# Patient Record
Sex: Male | Born: 1940
Health system: Southern US, Community
[De-identification: ages and names within clinical notes are randomized; demographics above are authoritative.]

## PROBLEM LIST (undated history)

## (undated) DIAGNOSIS — E7800 Pure hypercholesterolemia, unspecified: Secondary | ICD-10-CM

## (undated) DIAGNOSIS — I10 Essential (primary) hypertension: Secondary | ICD-10-CM

## (undated) DIAGNOSIS — M1990 Unspecified osteoarthritis, unspecified site: Secondary | ICD-10-CM

## (undated) DIAGNOSIS — Z8719 Personal history of other diseases of the digestive system: Secondary | ICD-10-CM

## (undated) DIAGNOSIS — I739 Peripheral vascular disease, unspecified: Secondary | ICD-10-CM

## (undated) DIAGNOSIS — E119 Type 2 diabetes mellitus without complications: Secondary | ICD-10-CM

## (undated) DIAGNOSIS — K219 Gastro-esophageal reflux disease without esophagitis: Secondary | ICD-10-CM

## (undated) DIAGNOSIS — I639 Cerebral infarction, unspecified: Secondary | ICD-10-CM

## (undated) DIAGNOSIS — C44300 Unspecified malignant neoplasm of skin of unspecified part of face: Secondary | ICD-10-CM

## (undated) DIAGNOSIS — Z8739 Personal history of other diseases of the musculoskeletal system and connective tissue: Secondary | ICD-10-CM

## (undated) DIAGNOSIS — H9190 Unspecified hearing loss, unspecified ear: Secondary | ICD-10-CM

## (undated) DIAGNOSIS — F329 Major depressive disorder, single episode, unspecified: Secondary | ICD-10-CM

## (undated) DIAGNOSIS — F32A Depression, unspecified: Secondary | ICD-10-CM

## (undated) HISTORY — PX: COLONOSCOPY: SHX174

## (undated) HISTORY — PX: SKIN CANCER EXCISION: SHX779

## (undated) HISTORY — PX: KNEE ARTHROSCOPY: SUR90

## (undated) HISTORY — DX: Type 2 diabetes mellitus without complications: E11.9

## (undated) HISTORY — DX: Unspecified hearing loss, unspecified ear: H91.90

---

## 2000-04-30 ENCOUNTER — Ambulatory Visit (HOSPITAL_COMMUNITY): Admission: RE | Admit: 2000-04-30 | Discharge: 2000-04-30 | Payer: Self-pay | Admitting: Gastroenterology

## 2001-05-30 ENCOUNTER — Encounter: Payer: Self-pay | Admitting: Gastroenterology

## 2001-05-30 ENCOUNTER — Encounter: Admission: RE | Admit: 2001-05-30 | Discharge: 2001-05-30 | Payer: Self-pay | Admitting: Gastroenterology

## 2002-12-29 ENCOUNTER — Inpatient Hospital Stay (HOSPITAL_COMMUNITY): Admission: EM | Admit: 2002-12-29 | Discharge: 2002-12-30 | Payer: Self-pay | Admitting: Emergency Medicine

## 2002-12-29 ENCOUNTER — Encounter: Payer: Self-pay | Admitting: Internal Medicine

## 2002-12-29 ENCOUNTER — Encounter (INDEPENDENT_AMBULATORY_CARE_PROVIDER_SITE_OTHER): Payer: Self-pay | Admitting: *Deleted

## 2002-12-30 ENCOUNTER — Encounter: Payer: Self-pay | Admitting: Internal Medicine

## 2004-12-13 ENCOUNTER — Encounter: Admission: RE | Admit: 2004-12-13 | Discharge: 2004-12-13 | Payer: Self-pay | Admitting: Family Medicine

## 2004-12-29 ENCOUNTER — Encounter: Admission: RE | Admit: 2004-12-29 | Discharge: 2004-12-29 | Payer: Self-pay | Admitting: Family Medicine

## 2004-12-31 ENCOUNTER — Emergency Department (HOSPITAL_COMMUNITY): Admission: EM | Admit: 2004-12-31 | Discharge: 2004-12-31 | Payer: Self-pay | Admitting: Family Medicine

## 2007-09-16 ENCOUNTER — Emergency Department (HOSPITAL_COMMUNITY): Admission: EM | Admit: 2007-09-16 | Discharge: 2007-09-16 | Payer: Self-pay | Admitting: Emergency Medicine

## 2011-02-10 NOTE — Procedures (Signed)
[facility]  Patient:    Trevor Ward, Trevor Ward                       MRN: [id number] Proc. Date: 04/30/00 Attending:  Verlin Grills, M.D. CC:         Desma Maxim, M.D.                           Procedure Report  PROCEDURE:  Colonoscopy and colon polyp biopsy.  ENDOSCOPIST:  Verlin Grills, M.D.  REFERRING PHYSICIAN:  Desma Maxim, M.D.  INDICATIONS FOR PROCEDURE:  The patient is a 70 year old male referred by Dr. Donia Guiles for diagnostic colonoscopy to evaluate Hemoccult positive stool.  The patient takes Advil on and off for tendinitis.  When he takes Advil, he will pass fresh blood with it, otherwise normal bowel movements.  He denies a personal or family history of colorectal neoplasia.  The patient viewed our colonoscopy education film.  I discussed with him the complications associated with colonoscopy and polypectomy including a 15 per 1000 risk for bleeding, and 4 per 1000 risk of intestinal perforation.  The patient has signed his operative permit for a diagnostic colonoscopy.  MEDICATION ALLERGIES:  None.  CHRONIC MEDICATIONS:  Advil for elbow tendinitis, Zantac p.r.n. heartburn.  PAST MEDICAL HISTORY:  Knee surgery in 1999.  PREMEDICATION:  Demerol 50 mg and Versed 5 mg.  ENDOSCOPE:  Olympus pediatric colonoscope.  DESCRIPTION OF PROCEDURE:  After obtaining informed consent, the patient was placed in the left lateral decubitus position.  I administered intravenous Demerol and intravenous Versed to achieve conscious sedation for the procedure.  The patients blood pressure, oxygen saturation, and cardiac rhythm were monitored throughout the procedure and documented in the medical record.  Anal inspection was normal.  Digital rectal exam revealed a non-nodular prostate.  The Olympus pediatric video colonoscope was introduced into the rectum and under direct vision advanced to the cecum as identified by a  normal-appearing ileocecal valve.  Colonoscopic preparation for the exam today was satisfactory.  Rectum:  Large nonbleeding internal hemorrhoids.  No evidence of rectal neoplasia.  Sigmoid colon and descending colon:  Left colonic diverticulosis.  Splenic flexure normal.  Transverse colon:  Colonic diverticulosis.  Hepatic flexure normal.  Ascending colon:  Colonic diverticulosis.  Cecum and ileocecal valve:  From the proximal cecum, a 1 mm sessile polyp was removed with the cold biopsy forceps and submitted for pathologic interpretation.  ASSESSMENT: 1. Large nonbleeding internal hemorrhoids. 2. Universal colonic diverticulosis. 3. From the proximal cecum, a 1 mm sessile polyp was removed with the cold    biopsy forceps.  RECOMMENDATIONS:  The cecal polyp returned neoplastic.  The patient should undergo a repeat colonoscopy in five years.  If the cecal polyp is non-neoplastic, he should undergo a repeat colonoscopy in 10 years. DD:  04/30/00 TD:  04/30/00 Job: [id number]

## 2011-05-15 ENCOUNTER — Emergency Department (HOSPITAL_COMMUNITY): Payer: Medicare Other

## 2011-05-15 ENCOUNTER — Inpatient Hospital Stay (INDEPENDENT_AMBULATORY_CARE_PROVIDER_SITE_OTHER)
Admission: RE | Admit: 2011-05-15 | Discharge: 2011-05-15 | Disposition: A | Discharge status: Home / Self Care | Payer: Medicare Other | Source: Ambulatory Visit | Attending: Family Medicine | Admitting: Family Medicine

## 2011-05-15 ENCOUNTER — Emergency Department (HOSPITAL_COMMUNITY)
Admission: EM | Admit: 2011-05-15 | Discharge: 2011-05-15 | Disposition: A | Discharge status: Home / Self Care | Payer: Medicare Other | Attending: Emergency Medicine | Admitting: Emergency Medicine

## 2011-05-15 DIAGNOSIS — R51 Headache: Secondary | ICD-10-CM | POA: Insufficient documentation

## 2011-05-15 DIAGNOSIS — IMO0002 Reserved for concepts with insufficient information to code with codable children: Secondary | ICD-10-CM | POA: Insufficient documentation

## 2011-05-15 LAB — CBC
HCT: 44.8 % (ref 39.0–52.0)
MCH: 30.7 pg (ref 26.0–34.0)
MCV: 84.4 fL (ref 78.0–100.0)
Platelets: 217 10*3/uL (ref 150–400)
RBC: 5.31 MIL/uL (ref 4.22–5.81)

## 2011-05-15 LAB — DIFFERENTIAL
Eosinophils Absolute: 0.2 10*3/uL (ref 0.0–0.7)
Lymphs Abs: 2.5 10*3/uL (ref 0.7–4.0)
Monocytes Relative: 8 % (ref 3–12)
Neutrophils Relative %: 54 % (ref 43–77)

## 2011-05-15 LAB — COMPREHENSIVE METABOLIC PANEL
AST: 18 U/L (ref 0–37)
BUN: 14 mg/dL (ref 6–23)
CO2: 30 mEq/L (ref 19–32)
Calcium: 9.9 mg/dL (ref 8.4–10.5)
Creatinine, Ser: 0.87 mg/dL (ref 0.50–1.35)
GFR calc Af Amer: >60 mL/min (ref >60)
GFR calc non Af Amer: >60 mL/min (ref >60)
Glucose, Bld: 89 mg/dL (ref 70–99)

## 2011-10-05 DIAGNOSIS — M109 Gout, unspecified: Secondary | ICD-10-CM | POA: Diagnosis not present

## 2012-01-15 DIAGNOSIS — L570 Actinic keratosis: Secondary | ICD-10-CM | POA: Diagnosis not present

## 2012-08-06 DIAGNOSIS — D485 Neoplasm of uncertain behavior of skin: Secondary | ICD-10-CM | POA: Diagnosis not present

## 2012-08-06 DIAGNOSIS — L570 Actinic keratosis: Secondary | ICD-10-CM | POA: Diagnosis not present

## 2012-09-30 DIAGNOSIS — L570 Actinic keratosis: Secondary | ICD-10-CM | POA: Diagnosis not present

## 2012-10-14 DIAGNOSIS — M7040 Prepatellar bursitis, unspecified knee: Secondary | ICD-10-CM | POA: Diagnosis not present

## 2012-11-11 DIAGNOSIS — M7040 Prepatellar bursitis, unspecified knee: Secondary | ICD-10-CM | POA: Diagnosis not present

## 2013-01-01 DIAGNOSIS — R0789 Other chest pain: Secondary | ICD-10-CM | POA: Diagnosis not present

## 2013-01-01 DIAGNOSIS — R1013 Epigastric pain: Secondary | ICD-10-CM | POA: Diagnosis not present

## 2013-01-01 DIAGNOSIS — K219 Gastro-esophageal reflux disease without esophagitis: Secondary | ICD-10-CM | POA: Diagnosis not present

## 2013-03-31 DIAGNOSIS — L570 Actinic keratosis: Secondary | ICD-10-CM | POA: Diagnosis not present

## 2013-10-14 DIAGNOSIS — L570 Actinic keratosis: Secondary | ICD-10-CM | POA: Diagnosis not present

## 2013-10-14 DIAGNOSIS — L578 Other skin changes due to chronic exposure to nonionizing radiation: Secondary | ICD-10-CM | POA: Diagnosis not present

## 2013-12-09 DIAGNOSIS — L570 Actinic keratosis: Secondary | ICD-10-CM | POA: Diagnosis not present

## 2013-12-23 DIAGNOSIS — G479 Sleep disorder, unspecified: Secondary | ICD-10-CM | POA: Diagnosis not present

## 2013-12-23 DIAGNOSIS — M109 Gout, unspecified: Secondary | ICD-10-CM | POA: Diagnosis not present

## 2013-12-23 DIAGNOSIS — Z23 Encounter for immunization: Secondary | ICD-10-CM | POA: Diagnosis not present

## 2013-12-23 DIAGNOSIS — K219 Gastro-esophageal reflux disease without esophagitis: Secondary | ICD-10-CM | POA: Diagnosis not present

## 2013-12-23 DIAGNOSIS — E782 Mixed hyperlipidemia: Secondary | ICD-10-CM | POA: Diagnosis not present

## 2013-12-31 DIAGNOSIS — E782 Mixed hyperlipidemia: Secondary | ICD-10-CM | POA: Diagnosis not present

## 2013-12-31 DIAGNOSIS — M109 Gout, unspecified: Secondary | ICD-10-CM | POA: Diagnosis not present

## 2014-03-23 DIAGNOSIS — M109 Gout, unspecified: Secondary | ICD-10-CM | POA: Diagnosis not present

## 2014-06-17 ENCOUNTER — Emergency Department (HOSPITAL_COMMUNITY)
Admission: EM | Admit: 2014-06-17 | Discharge: 2014-06-18 | Disposition: A | Discharge status: Home / Self Care | Payer: Medicare Other | Attending: Emergency Medicine | Admitting: Emergency Medicine

## 2014-06-17 ENCOUNTER — Encounter (HOSPITAL_COMMUNITY): Payer: Self-pay | Admitting: Emergency Medicine

## 2014-06-17 DIAGNOSIS — Z87891 Personal history of nicotine dependence: Secondary | ICD-10-CM | POA: Insufficient documentation

## 2014-06-17 DIAGNOSIS — Z79899 Other long term (current) drug therapy: Secondary | ICD-10-CM | POA: Diagnosis not present

## 2014-06-17 DIAGNOSIS — R011 Cardiac murmur, unspecified: Secondary | ICD-10-CM | POA: Diagnosis not present

## 2014-06-17 DIAGNOSIS — I10 Essential (primary) hypertension: Secondary | ICD-10-CM | POA: Diagnosis not present

## 2014-06-17 DIAGNOSIS — Z791 Long term (current) use of non-steroidal anti-inflammatories (NSAID): Secondary | ICD-10-CM | POA: Diagnosis not present

## 2014-06-17 LAB — COMPREHENSIVE METABOLIC PANEL
ALBUMIN: 3.7 g/dL (ref 3.5–5.2)
ALT: 11 U/L (ref 0–53)
AST: 14 U/L (ref 0–37)
Alkaline Phosphatase: 83 U/L (ref 39–117)
Anion gap: 11 (ref 5–15)
BUN: 12 mg/dL (ref 6–23)
CALCIUM: 9.4 mg/dL (ref 8.4–10.5)
CO2: 27 meq/L (ref 19–32)
CREATININE: 0.92 mg/dL (ref 0.50–1.35)
Chloride: 103 mEq/L (ref 96–112)
GFR calc Af Amer: >90 mL/min (ref >90)
GFR, EST NON AFRICAN AMERICAN: 80 mL/min — AB (ref >90)
Glucose, Bld: 99 mg/dL (ref 70–99)
Potassium: 4.7 mEq/L (ref 3.7–5.3)
Sodium: 141 mEq/L (ref 137–147)
TOTAL PROTEIN: 7.1 g/dL (ref 6.0–8.3)
Total Bilirubin: 0.3 mg/dL (ref 0.3–1.2)

## 2014-06-17 LAB — CBC WITH DIFFERENTIAL/PLATELET
BASOS ABS: 0 10*3/uL (ref 0.0–0.1)
BASOS PCT: 0 % (ref 0–1)
EOS ABS: 0.3 10*3/uL (ref 0.0–0.7)
EOS PCT: 5 % (ref 0–5)
HEMATOCRIT: 42.4 % (ref 39.0–52.0)
Hemoglobin: 14.7 g/dL (ref 13.0–17.0)
Lymphocytes Relative: 33 % (ref 12–46)
Lymphs Abs: 2.2 10*3/uL (ref 0.7–4.0)
MCH: 29.8 pg (ref 26.0–34.0)
MCHC: 34.7 g/dL (ref 30.0–36.0)
MCV: 86 fL (ref 78.0–100.0)
MONO ABS: 0.5 10*3/uL (ref 0.1–1.0)
Monocytes Relative: 8 % (ref 3–12)
Neutro Abs: 3.6 10*3/uL (ref 1.7–7.7)
Neutrophils Relative %: 54 % (ref 43–77)
Platelets: 227 10*3/uL (ref 150–400)
RBC: 4.93 MIL/uL (ref 4.22–5.81)
RDW: 13.8 % (ref 11.5–15.5)
WBC: 6.7 10*3/uL (ref 4.0–10.5)

## 2014-06-17 NOTE — ED Notes (Signed)
Pt monitored by pulse ox, bp cuff, and 12-lead. 

## 2014-06-17 NOTE — ED Notes (Signed)
Pt. reports elevated blood pressure at home this evening 204/84 , alert and oriented , respirations unlabored ,denies pain / no nausea or vomitting . Pt. is not taking antihypertensive medication .

## 2014-06-17 NOTE — ED Notes (Signed)
Family at bedside. 

## 2014-06-18 DIAGNOSIS — I10 Essential (primary) hypertension: Secondary | ICD-10-CM | POA: Diagnosis not present

## 2014-06-18 LAB — I-STAT TROPONIN, ED: TROPONIN I, POC: 0 ng/mL (ref 0.00–0.08)

## 2014-06-18 NOTE — Discharge Instructions (Signed)
If you were given medicines take as directed.  If you are on coumadin or contraceptives realize their levels and effectiveness is altered by many different medicines.  If you have any reaction (rash, tongues swelling, other) to the medicines stop taking and see a physician.   Please follow up as directed and return to the ER or see a physician for new or worsening symptoms.  Thank you. Filed Vitals:   06/17/14 2330 06/17/14 2345 06/18/14 0000 06/18/14 0015  BP: 158/88 158/81 177/78 153/78  Pulse: 76 73 82 72  Temp:      TempSrc:      Resp: 19 22 22 17   Height:      Weight:      SpO2: 96% 99% 99% 99%    Hypertension Hypertension, commonly called high blood pressure, is when the force of blood pumping through your arteries is too strong. Your arteries are the blood vessels that carry blood from your heart throughout your body. A blood pressure reading consists of a higher number over a lower number, such as 110/72. The higher number (systolic) is the pressure inside your arteries when your heart pumps. The lower number (diastolic) is the pressure inside your arteries when your heart relaxes. Ideally you want your blood pressure below 120/80. Hypertension forces your heart to work harder to pump blood. Your arteries may become narrow or stiff. Having hypertension puts you at risk for heart disease, stroke, and other problems.  RISK FACTORS Some risk factors for high blood pressure are controllable. Others are not.  Risk factors you cannot control include:   Race. You may be at higher risk if you are African American.  Age. Risk increases with age.  Gender. Men are at higher risk than women before age 47 years. After age 61, women are at higher risk than men. Risk factors you can control include:  Not getting enough exercise or physical activity.  Being overweight.  Getting too much fat, sugar, calories, or salt in your diet.  Drinking too much alcohol. SIGNS AND SYMPTOMS Hypertension  does not usually cause signs or symptoms. Extremely high blood pressure (hypertensive crisis) may cause headache, anxiety, shortness of breath, and nosebleed. DIAGNOSIS  To check if you have hypertension, your health care provider will measure your blood pressure while you are seated, with your arm held at the level of your heart. It should be measured at least twice using the same arm. Certain conditions can cause a difference in blood pressure between your right and left arms. A blood pressure reading that is higher than normal on one occasion does not mean that you need treatment. If one blood pressure reading is high, ask your health care provider about having it checked again. TREATMENT  Treating high blood pressure includes making lifestyle changes and possibly taking medicine. Living a healthy lifestyle can help lower high blood pressure. You may need to change some of your habits. Lifestyle changes may include:  Following the DASH diet. This diet is high in fruits, vegetables, and whole grains. It is low in salt, red meat, and added sugars.  Getting at least 2 hours of brisk physical activity every week.  Losing weight if necessary.  Not smoking.  Limiting alcoholic beverages.  Learning ways to reduce stress. If lifestyle changes are not enough to get your blood pressure under control, your health care provider may prescribe medicine. You may need to take more than one. Work closely with your health care provider to understand the risks  and benefits. HOME CARE INSTRUCTIONS  Have your blood pressure rechecked as directed by your health care provider.   Take medicines only as directed by your health care provider. Follow the directions carefully. Blood pressure medicines must be taken as prescribed. The medicine does not work as well when you skip doses. Skipping doses also puts you at risk for problems.   Do not smoke.   Monitor your blood pressure at home as directed by your  health care provider. SEEK MEDICAL CARE IF:   You think you are having a reaction to medicines taken.  You have recurrent headaches or feel dizzy.  You have swelling in your ankles.  You have trouble with your vision. SEEK IMMEDIATE MEDICAL CARE IF:  You develop a severe headache or confusion.  You have unusual weakness, numbness, or feel faint.  You have severe chest or abdominal pain.  You vomit repeatedly.  You have trouble breathing. MAKE SURE YOU:   Understand these instructions.  Will watch your condition.  Will get help right away if you are not doing well or get worse. Document Released: 09/11/2005 Document Revised: 01/26/2014 Document Reviewed: 07/04/2013 [facility] Patient Information 2015 [place], [place]. This information is not intended to replace advice given to you by your health care provider. Make sure you discuss any questions you have with your health care provider.

## 2014-06-18 NOTE — ED Provider Notes (Signed)
CSN: [id number]     Arrival date & time 06/17/14  2124 History   First MD Initiated Contact with Patient 06/17/14 2327     Chief Complaint  Patient presents with  . Hypertension     (Consider location/radiation/quality/duration/timing/severity/associated sxs/prior Treatment) HPI Comments: 73 year old male with significant medical history presents with elevated blood pressure 824 systolic this evening. Patient had mild queasy abdomen around dinnertime however has had no symptoms since. Patient denies chest pain, shortness of breath, neurologic symptoms. Patient says she's ready to home go to bed. Patient has no history of high blood pressure or cardiac history. Patient is not on blood pressure medications. Patient has a primary Dr. follow up with  Patient is a 73 y.o. male presenting with hypertension. The history is provided by the patient.  Hypertension Pertinent negatives include no chest pain, no abdominal pain, no headaches and no shortness of breath.    History reviewed. No pertinent past medical history. History reviewed. No pertinent past surgical history. No family history on file. History  Substance Use Topics  . Smoking status: Former Research scientist (life sciences)  . Smokeless tobacco: Not on file  . Alcohol Use: No    Review of Systems  Constitutional: Negative for fever and chills.  HENT: Negative for congestion.   Eyes: Negative for visual disturbance.  Respiratory: Negative for shortness of breath.   Cardiovascular: Negative for chest pain.  Gastrointestinal: Negative for vomiting and abdominal pain.  Genitourinary: Negative for dysuria and flank pain.  Musculoskeletal: Negative for back pain, neck pain and neck stiffness.  Skin: Negative for rash.  Neurological: Negative for weakness, light-headedness, numbness and headaches.      Allergies  Review of patient's allergies indicates no known allergies.  Home Medications   Prior to Admission medications   Medication Sig Start  Date End Date Taking? Authorizing Provider  amitriptyline (ELAVIL) 10 MG tablet Take 10 mg by mouth at bedtime.   Yes Historical Provider, MD  esomeprazole (NEXIUM) 40 MG capsule Take 40 mg by mouth daily at 12 noon.   Yes Historical Provider, MD  ibuprofen (ADVIL,MOTRIN) 200 MG tablet Take 200-600 mg by mouth every morning.   Yes Historical Provider, MD   BP 158/81  Pulse 73  Temp(Src) 97.7 F (36.5 C) (Oral)  Resp 22  Ht 5\' 11"  (1.803 m)  Wt 182 lb (82.555 kg)  BMI 25.40 kg/m2  SpO2 99% Physical Exam  Nursing note and vitals reviewed. Constitutional: He is oriented to person, place, and time. He appears well-developed and well-nourished.  HENT:  Head: Normocephalic and atraumatic.  Eyes: Conjunctivae are normal. Right eye exhibits no discharge. Left eye exhibits no discharge.  Neck: Normal range of motion. Neck supple. No tracheal deviation present.  Cardiovascular: Normal rate, regular rhythm and intact distal pulses.   Murmur (2+ systolic left sternal border) heard. Pulmonary/Chest: Effort normal and breath sounds normal.  Abdominal: Soft. He exhibits no distension. There is no tenderness. There is no guarding.  Musculoskeletal: He exhibits no edema.  Neurological: He is alert and oriented to person, place, and time. No cranial nerve deficit.  Skin: Skin is warm. No rash noted.  Psychiatric: He has a normal mood and affect.    ED Course  Procedures (including critical care time) Labs Review Labs Reviewed  COMPREHENSIVE METABOLIC PANEL - Abnormal; Notable for the following:    GFR calc non Af Amer 80 (*)    All other components within normal limits  CBC WITH DIFFERENTIAL  Randolm Idol, ED  Imaging Review No results found.   EKG Interpretation   Date/Time:  Thursday June 18 2014 00:22:44 EDT Ventricular Rate:  75 PR Interval:  167 QRS Duration: 82 QT Interval:  403 QTC Calculation: 450 R Axis:   -10 Text Interpretation:  Sinus rhythm Abnormal R-wave  progression, early  transition Confirmed by Elah Avellino  MD, Erbie Arment ([id number]) on 06/18/2014 12:27:16 AM      MDM   Final diagnoses:  None  High blood pressure  Well-appearing male with elevated blood pressure asymptomatic. With age patient had screening labs done in triage, reviewed unremarkable. EKG reviewed no acute findings. Patient remained asymptomatic during observation the ER and comfortable with outpatient followup. Reasons to return discussed with patient and family members.  Results and differential diagnosis were discussed with the patient/parent/guardian. Close follow up outpatient was discussed, comfortable with the plan.   Medications - No data to display  Filed Vitals:   06/17/14 2330 06/17/14 2345 06/18/14 0000 06/18/14 0015  BP: 158/88 158/81 177/78 153/78  Pulse: 76 73 82 72  Temp:      TempSrc:      Resp: 19 22 22 17   Height:      Weight:      SpO2: 96% 99% 99% 99%    Final diagnoses:  None        Mariea Clonts, MD 06/18/14 [phone]

## 2014-06-24 DIAGNOSIS — R030 Elevated blood-pressure reading, without diagnosis of hypertension: Secondary | ICD-10-CM | POA: Diagnosis not present

## 2014-06-24 DIAGNOSIS — M1990 Unspecified osteoarthritis, unspecified site: Secondary | ICD-10-CM | POA: Diagnosis not present

## 2014-08-18 DIAGNOSIS — I10 Essential (primary) hypertension: Secondary | ICD-10-CM | POA: Diagnosis not present

## 2014-08-18 DIAGNOSIS — Z79899 Other long term (current) drug therapy: Secondary | ICD-10-CM | POA: Diagnosis not present

## 2014-09-25 DIAGNOSIS — I639 Cerebral infarction, unspecified: Secondary | ICD-10-CM

## 2014-09-25 HISTORY — DX: Cerebral infarction, unspecified: I63.9

## 2015-02-04 DIAGNOSIS — I10 Essential (primary) hypertension: Secondary | ICD-10-CM | POA: Diagnosis not present

## 2015-02-04 DIAGNOSIS — K219 Gastro-esophageal reflux disease without esophagitis: Secondary | ICD-10-CM | POA: Diagnosis not present

## 2015-02-04 DIAGNOSIS — G4700 Insomnia, unspecified: Secondary | ICD-10-CM | POA: Diagnosis not present

## 2015-02-04 DIAGNOSIS — Z0000 Encounter for general adult medical examination without abnormal findings: Secondary | ICD-10-CM | POA: Diagnosis not present

## 2015-02-04 DIAGNOSIS — M109 Gout, unspecified: Secondary | ICD-10-CM | POA: Diagnosis not present

## 2015-02-04 DIAGNOSIS — E782 Mixed hyperlipidemia: Secondary | ICD-10-CM | POA: Diagnosis not present

## 2015-02-04 DIAGNOSIS — N529 Male erectile dysfunction, unspecified: Secondary | ICD-10-CM | POA: Diagnosis not present

## 2015-02-09 DIAGNOSIS — I10 Essential (primary) hypertension: Secondary | ICD-10-CM | POA: Diagnosis not present

## 2015-04-11 ENCOUNTER — Emergency Department (HOSPITAL_COMMUNITY): Payer: Medicare Other

## 2015-04-11 ENCOUNTER — Inpatient Hospital Stay (HOSPITAL_COMMUNITY): Payer: Medicare Other

## 2015-04-11 ENCOUNTER — Inpatient Hospital Stay (HOSPITAL_COMMUNITY)
Admission: EM | Admit: 2015-04-11 | Discharge: 2015-04-14 | DRG: 066 | Disposition: A | Discharge status: Home / Self Care | Payer: Medicare Other | Attending: Internal Medicine | Admitting: Internal Medicine

## 2015-04-11 ENCOUNTER — Encounter (HOSPITAL_COMMUNITY): Payer: Self-pay | Admitting: Emergency Medicine

## 2015-04-11 DIAGNOSIS — R262 Difficulty in walking, not elsewhere classified: Secondary | ICD-10-CM | POA: Diagnosis present

## 2015-04-11 DIAGNOSIS — Z79899 Other long term (current) drug therapy: Secondary | ICD-10-CM

## 2015-04-11 DIAGNOSIS — Z791 Long term (current) use of non-steroidal anti-inflammatories (NSAID): Secondary | ICD-10-CM

## 2015-04-11 DIAGNOSIS — I63311 Cerebral infarction due to thrombosis of right middle cerebral artery: Secondary | ICD-10-CM | POA: Diagnosis not present

## 2015-04-11 DIAGNOSIS — Z7982 Long term (current) use of aspirin: Secondary | ICD-10-CM | POA: Diagnosis not present

## 2015-04-11 DIAGNOSIS — Z823 Family history of stroke: Secondary | ICD-10-CM | POA: Diagnosis not present

## 2015-04-11 DIAGNOSIS — I6789 Other cerebrovascular disease: Secondary | ICD-10-CM | POA: Diagnosis not present

## 2015-04-11 DIAGNOSIS — M109 Gout, unspecified: Secondary | ICD-10-CM | POA: Diagnosis present

## 2015-04-11 DIAGNOSIS — R29810 Facial weakness: Secondary | ICD-10-CM | POA: Diagnosis present

## 2015-04-11 DIAGNOSIS — I10 Essential (primary) hypertension: Secondary | ICD-10-CM | POA: Diagnosis not present

## 2015-04-11 DIAGNOSIS — I6521 Occlusion and stenosis of right carotid artery: Secondary | ICD-10-CM | POA: Diagnosis not present

## 2015-04-11 DIAGNOSIS — I639 Cerebral infarction, unspecified: Secondary | ICD-10-CM | POA: Diagnosis not present

## 2015-04-11 DIAGNOSIS — Z87891 Personal history of nicotine dependence: Secondary | ICD-10-CM

## 2015-04-11 DIAGNOSIS — I739 Peripheral vascular disease, unspecified: Secondary | ICD-10-CM | POA: Diagnosis present

## 2015-04-11 DIAGNOSIS — Z006 Encounter for examination for normal comparison and control in clinical research program: Secondary | ICD-10-CM | POA: Diagnosis not present

## 2015-04-11 DIAGNOSIS — Z8673 Personal history of transient ischemic attack (TIA), and cerebral infarction without residual deficits: Secondary | ICD-10-CM | POA: Diagnosis present

## 2015-04-11 DIAGNOSIS — E785 Hyperlipidemia, unspecified: Secondary | ICD-10-CM | POA: Diagnosis not present

## 2015-04-11 DIAGNOSIS — I6622 Occlusion and stenosis of left posterior cerebral artery: Secondary | ICD-10-CM | POA: Diagnosis present

## 2015-04-11 DIAGNOSIS — I6503 Occlusion and stenosis of bilateral vertebral arteries: Secondary | ICD-10-CM | POA: Diagnosis not present

## 2015-04-11 DIAGNOSIS — I63231 Cerebral infarction due to unspecified occlusion or stenosis of right carotid arteries: Secondary | ICD-10-CM | POA: Diagnosis not present

## 2015-04-11 HISTORY — DX: Essential (primary) hypertension: I10

## 2015-04-11 LAB — COMPREHENSIVE METABOLIC PANEL
ALT: 12 U/L — ABNORMAL LOW (ref 17–63)
AST: 16 U/L (ref 15–41)
Albumin: 3.3 g/dL — ABNORMAL LOW (ref 3.5–5.0)
Alkaline Phosphatase: 60 U/L (ref 38–126)
Anion gap: 7 (ref 5–15)
BUN: 13 mg/dL (ref 6–20)
CO2: 25 mmol/L (ref 22–32)
CREATININE: 1.15 mg/dL (ref 0.61–1.24)
Calcium: 8.6 mg/dL — ABNORMAL LOW (ref 8.9–10.3)
Chloride: 105 mmol/L (ref 101–111)
GFR, EST NON AFRICAN AMERICAN: 60 mL/min — AB (ref >60)
GLUCOSE: 91 mg/dL (ref 65–99)
Potassium: 4.5 mmol/L (ref 3.5–5.1)
Sodium: 137 mmol/L (ref 135–145)
TOTAL PROTEIN: 5.9 g/dL — AB (ref 6.5–8.1)
Total Bilirubin: 0.3 mg/dL (ref 0.3–1.2)

## 2015-04-11 LAB — RAPID URINE DRUG SCREEN, HOSP PERFORMED
Amphetamines: NOT DETECTED
Barbiturates: NOT DETECTED
Benzodiazepines: NOT DETECTED
Cocaine: NOT DETECTED
OPIATES: NOT DETECTED
TETRAHYDROCANNABINOL: NOT DETECTED

## 2015-04-11 LAB — TSH: TSH: 1.757 u[IU]/mL (ref 0.350–4.500)

## 2015-04-11 LAB — CBG MONITORING, ED: Glucose-Capillary: 80 mg/dL (ref 65–99)

## 2015-04-11 LAB — I-STAT CHEM 8, ED
BUN: 15 mg/dL (ref 6–20)
CALCIUM ION: 1.14 mmol/L (ref 1.13–1.30)
CHLORIDE: 102 mmol/L (ref 101–111)
Creatinine, Ser: 1.1 mg/dL (ref 0.61–1.24)
Glucose, Bld: 86 mg/dL (ref 65–99)
HEMATOCRIT: 41 % (ref 39.0–52.0)
Hemoglobin: 13.9 g/dL (ref 13.0–17.0)
POTASSIUM: 4.5 mmol/L (ref 3.5–5.1)
Sodium: 139 mmol/L (ref 135–145)
TCO2: 23 mmol/L (ref 0–100)

## 2015-04-11 LAB — URINALYSIS, ROUTINE W REFLEX MICROSCOPIC
BILIRUBIN URINE: NEGATIVE
Glucose, UA: NEGATIVE mg/dL
Hgb urine dipstick: NEGATIVE
Ketones, ur: NEGATIVE mg/dL
Leukocytes, UA: NEGATIVE
NITRITE: NEGATIVE
PH: 6.5 (ref 5.0–8.0)
Protein, ur: NEGATIVE mg/dL
SPECIFIC GRAVITY, URINE: 1.007 (ref 1.005–1.030)
Urobilinogen, UA: 0.2 mg/dL (ref 0.0–1.0)

## 2015-04-11 LAB — CBC
HEMATOCRIT: 38 % — AB (ref 39.0–52.0)
HEMOGLOBIN: 13.1 g/dL (ref 13.0–17.0)
MCH: 30.4 pg (ref 26.0–34.0)
MCHC: 34.5 g/dL (ref 30.0–36.0)
MCV: 88.2 fL (ref 78.0–100.0)
Platelets: 206 10*3/uL (ref 150–400)
RBC: 4.31 MIL/uL (ref 4.22–5.81)
RDW: 14 % (ref 11.5–15.5)
WBC: 6.8 10*3/uL (ref 4.0–10.5)

## 2015-04-11 LAB — DIFFERENTIAL
Basophils Absolute: 0 10*3/uL (ref 0.0–0.1)
Basophils Relative: 0 % (ref 0–1)
EOS ABS: 0.2 10*3/uL (ref 0.0–0.7)
EOS PCT: 3 % (ref 0–5)
LYMPHS ABS: 1.4 10*3/uL (ref 0.7–4.0)
Lymphocytes Relative: 20 % (ref 12–46)
MONO ABS: 0.4 10*3/uL (ref 0.1–1.0)
MONOS PCT: 6 % (ref 3–12)
NEUTROS ABS: 4.8 10*3/uL (ref 1.7–7.7)
Neutrophils Relative %: 71 % (ref 43–77)

## 2015-04-11 LAB — APTT: APTT: 33 s (ref 24–37)

## 2015-04-11 LAB — PROTIME-INR
INR: 1.05 (ref 0.00–1.49)
PROTHROMBIN TIME: 13.9 s (ref 11.6–15.2)

## 2015-04-11 LAB — I-STAT TROPONIN, ED: Troponin i, poc: 0.01 ng/mL (ref 0.00–0.08)

## 2015-04-11 LAB — ETHANOL: Alcohol, Ethyl (B): <5 mg/dL (ref <5)

## 2015-04-11 MED ORDER — ALLOPURINOL 100 MG PO TABS
100.0000 mg | ORAL_TABLET | Freq: Two times a day (BID) | ORAL | Status: DC
  Administered 2015-04-11 – 2015-04-14 (×7): 100 mg via ORAL
  Filled 2015-04-11 (×8): qty 1

## 2015-04-11 MED ORDER — STUDY - INVESTIGATIONAL DRUG SIMPLE RECORD
600.0000 mg | Status: AC
  Administered 2015-04-11: 600 mg via ORAL
  Filled 2015-04-11: qty 0.12

## 2015-04-11 MED ORDER — ENOXAPARIN SODIUM 40 MG/0.4ML ~~LOC~~ SOLN
40.0000 mg | SUBCUTANEOUS | Status: DC
  Administered 2015-04-11: 40 mg via SUBCUTANEOUS
  Filled 2015-04-11: qty 0.4

## 2015-04-11 MED ORDER — STROKE: EARLY STAGES OF RECOVERY BOOK
Freq: Once | Status: AC
  Administered 2015-04-11: 1

## 2015-04-11 MED ORDER — PANTOPRAZOLE SODIUM 40 MG PO TBEC
40.0000 mg | DELAYED_RELEASE_TABLET | Freq: Every day | ORAL | Status: DC
  Administered 2015-04-11 – 2015-04-14 (×4): 40 mg via ORAL
  Filled 2015-04-11 (×4): qty 1

## 2015-04-11 MED ORDER — ASPIRIN 300 MG RE SUPP
300.0000 mg | Freq: Every day | RECTAL | Status: DC
Start: 2015-04-11 — End: 2015-04-14

## 2015-04-11 MED ORDER — SENNOSIDES-DOCUSATE SODIUM 8.6-50 MG PO TABS
1.0000 | ORAL_TABLET | Freq: Every evening | ORAL | Status: DC | PRN
  Administered 2015-04-11: 1 via ORAL
  Filled 2015-04-11: qty 1

## 2015-04-11 MED ORDER — STUDY - INVESTIGATIONAL DRUG SIMPLE RECORD
75.0000 mg | Freq: Every day | Status: DC
  Administered 2015-04-12 – 2015-04-14 (×3): 75 mg via ORAL
  Filled 2015-04-11 (×2): qty 0.01

## 2015-04-11 MED ORDER — AMITRIPTYLINE HCL 10 MG PO TABS
10.0000 mg | ORAL_TABLET | Freq: Every day | ORAL | Status: DC
  Administered 2015-04-11 – 2015-04-13 (×3): 10 mg via ORAL
  Filled 2015-04-11 (×5): qty 1

## 2015-04-11 MED ORDER — LISINOPRIL 10 MG PO TABS
10.0000 mg | ORAL_TABLET | Freq: Every day | ORAL | Status: DC
  Administered 2015-04-11 – 2015-04-14 (×4): 10 mg via ORAL
  Filled 2015-04-11 (×4): qty 1

## 2015-04-11 MED ORDER — ASPIRIN 325 MG PO TABS
325.0000 mg | ORAL_TABLET | Freq: Every day | ORAL | Status: DC
  Administered 2015-04-11 – 2015-04-14 (×4): 325 mg via ORAL
  Filled 2015-04-11 (×4): qty 1

## 2015-04-11 NOTE — ED Notes (Signed)
Attempted report 

## 2015-04-11 NOTE — Progress Notes (Signed)
Patient arrived around 1500 alert oriented able to ambulate telemetry on, neuro and vitals q2, Lovenox VTE will continue to monitor. Ct negative for acute no MRI yet.

## 2015-04-11 NOTE — ED Notes (Signed)
Per EMS, pt was out at a baseball game this morning with his family when he started having right sided facial droop and right sided weakness. Per EMS, pt had no symptoms upon EMS arrival. Stroke symptoms occurred at 1015 per EMS. Pt alert x4. VSS in route. Pt appears to have left sided facial droop when this RN first saw the pt. Dr. Audie Pinto called to the bedside to see if code stroke needs to be activated.

## 2015-04-11 NOTE — H&P (Signed)
[facility] History and Physical                                                                                    Trevor Ward, is a 74 y.o. male  MRN: [id number]   DOB - 1940/12/08  Admit Date - 04/11/2015  Outpatient Primary MD for the patient is Mayra Neer, MD  Referring MD: Audie Pinto / ER  Consulting M.D: Leonel Ramsay / Neurology  With History of -  Past Medical History  Diagnosis Date  . Hypertension       History reviewed. No pertinent past surgical history.  in for   Chief Complaint  Patient presents with  . Stroke Symptoms     HPI This is a 74 yo male patient with past medical history of hypertension and gout who presented to the ER after his family noticed he was having gait disturbance with apparent right side weakness and left sided facial drooping. Patient denied any awareness of any of those symptoms. At 1045 this morning the patient's son noticed that the patient had left-sided facial drooping and left eye drooping and was having difficulty walking. He appeared to be unable to put any weight on the right side of his body and the right side of his body appeared to be flaccid according to the family. He had what was described as a staggering gait and he kept his eyes closed during the episode. When I questioned the patient he did not report any dizziness or nausea with these symptoms. He reports that he "felt hot" several times over the last few weeks and clarified this as feeling hot after being outside in the heat for greater than 1 hour per episode. He has not had any chest pain or shortness of breath with these symptoms. No recent illnesses or medication changes. He denied any tingling or numbness in the face or the extremities.  In the ER patient was afebrile, BP was 134/72 pulse 76 and regular room air saturations 97%. Stat CT of the head without contrast revealed atrophy with small vessel ischemic changes but no evidence of acute intracranial  abnormality. Labs were unremarkable. Since arrival to the ER although the patient had persistent left facial drooping he improved to the point he was able to stand up and walk. Patient told the neurologist and myself he felt like he was back to normal other than the left facial drooping. He has been evaluated by neurology. Since symptoms were not severe and actually improving he was not candidate for TPA.   Review of Systems   In addition to the HPI above,  No Fever-chills, myalgias or other constitutional symptoms No Headache, changes with Vision or hearing, tingling, numbness in any extremity, No problems swallowing food or Liquids, indigestion/reflux No Chest pain, Cough or Shortness of Breath, palpitations, orthopnea or DOE No Abdominal pain, N/V; no melena or hematochezia, no dark tarry stools, Bowel movements are regular, No dysuria, hematuria or flank pain No new skin rashes, lesions, masses or bruises, No new joints pains-aches No recent weight gain or loss No polyuria, polydypsia or polyphagia,  *A full 10 point Review of Systems was done, except as stated above,  all other Review of Systems were negative.  Social History History  Substance Use Topics  . Smoking status: Former Research scientist (life sciences)  . Smokeless tobacco: Not on file  . Alcohol Use: No    Resides at: Private residence  Lives with: Spouse  Ambulatory status: Without assistive devices prior to onset of current symptoms   Family History Mother with history of stroke   Prior to Admission medications   Medication Sig Start Date End Date Taking? Authorizing Provider  allopurinol (ZYLOPRIM) 100 MG tablet Take 100 mg by mouth 2 (two) times daily. 01/26/15  Yes Historical Provider, MD  amitriptyline (ELAVIL) 10 MG tablet Take 10 mg by mouth at bedtime.   Yes Historical Provider, MD  esomeprazole (NEXIUM) 40 MG capsule Take 40 mg by mouth daily at 12 noon.   Yes Historical Provider, MD  ibuprofen (ADVIL,MOTRIN) 200 MG tablet  Take 400 mg by mouth 2 (two) times daily.    Yes Historical Provider, MD  lisinopril (PRINIVIL,ZESTRIL) 10 MG tablet Take 10 mg by mouth daily. 04/01/15  Yes Historical Provider, MD    No Known Allergies  Physical Exam  Vitals  Blood pressure 119/85, pulse 74, temperature 97.8 F (36.6 C), temperature source Oral, resp. rate 21, SpO2 99 %.   General:  In no acute distress, appears healthy and well nourished  Psych:  Normal affect, Denies Suicidal or Homicidal ideations, Awake Alert, Oriented X 3. Speech and thought patterns are clear and appropriate, no apparent short term memory deficits  Neuro:   No focal neurological deficits, CN II through XII intact except for persistent left-sided facial drooping-no drooping noted with smiling and tongue deviation is midline-also able to squeeze eyes shut and open them equally, Strength 5/5 all 4 extremities, Sensation intact all 4 extremities.  ENT:  Ears and Eyes appear Normal, Conjunctivae clear, PER. Moist oral mucosa without erythema or exudates.  Neck:  Supple, No lymphadenopathy appreciated  Respiratory:  Symmetrical chest wall movement, Good air movement bilaterally, CTAB. Room Air  Cardiac:  RRR, No Murmurs, no LE edema noted, no JVD, No carotid bruits, peripheral pulses palpable at 2+  Abdomen:  Positive bowel sounds, Soft, Non tender, Non distended,  No masses appreciated, no obvious hepatosplenomegaly  Skin:  No Cyanosis, Normal Skin Turgor, No Skin Rash or Bruise.  Extremities: Symmetrical without obvious trauma or injury,  no effusions.  Data Review  CBC  Recent Labs Lab 04/11/15 1218 04/11/15 1225  WBC 6.8  --   HGB 13.1 13.9  HCT 38.0* 41.0  PLT 206  --   MCV 88.2  --   MCH 30.4  --   MCHC 34.5  --   RDW 14.0  --   LYMPHSABS 1.4  --   MONOABS 0.4  --   EOSABS 0.2  --   BASOSABS 0.0  --     Chemistries   Recent Labs Lab 04/11/15 1218 04/11/15 1225  NA 137 139  K 4.5 4.5  CL 105 102  CO2 25  --     GLUCOSE 91 86  BUN 13 15  CREATININE 1.15 1.10  CALCIUM 8.6*  --   AST 16  --   ALT 12*  --   ALKPHOS 60  --   BILITOT 0.3  --     CrCl cannot be calculated (Unknown ideal weight.).  No results for input(s): TSH, T4TOTAL, T3FREE, THYROIDAB in the last 72 hours.  Invalid input(s): FREET3  Coagulation profile  Recent Labs Lab 04/11/15 1218  INR  1.05    No results for input(s): DDIMER in the last 72 hours.  Cardiac Enzymes No results for input(s): CKMB, TROPONINI, MYOGLOBIN in the last 168 hours.  Invalid input(s): CK  Invalid input(s): POCBNP  Urinalysis No results found for: COLORURINE, APPEARANCEUR, LABSPEC, PHURINE, GLUCOSEU, HGBUR, BILIRUBINUR, KETONESUR, PROTEINUR, UROBILINOGEN, NITRITE, LEUKOCYTESUR  Imaging results:   Ct Head Wo Contrast  04/11/2015   CLINICAL DATA:  Code stroke, right facial droop  EXAM: CT HEAD WITHOUT CONTRAST  TECHNIQUE: Contiguous axial images were obtained from the base of the skull through the vertex without intravenous contrast.  COMPARISON:  None.  FINDINGS: No evidence of parenchymal hemorrhage or extra-axial fluid collection. No mass lesion, mass effect, or midline shift.  No CT evidence of acute infarction.  Subcortical white matter and periventricular small vessel ischemic changes.  Mild cortical atrophy.  The visualized paranasal sinuses are essentially clear. The mastoid air cells are unopacified.  No evidence of calvarial fracture.  IMPRESSION: No evidence of acute intracranial abnormality.  Atrophy with small vessel ischemic changes.  These results were called by telephone at the time of interpretation on 04/11/2015 at 12:21 pm to Dr. Leonard Schwartz , who verbally acknowledged these results.   Electronically Signed   By: Julian Hy M.D.   On: 04/11/2015 12:21     EKG: (Independently reviewed) sinus rhythm without any ST or T-wave changes that would be concerning for ischemia, QTC is normal   Assessment & Plan  Principal  Problem:   CVA (cerebral infarction) -Admit to telemetry -Appreciate neurology assistance -Stat MRI/MRA brain -Echocardiogram -Carotid duplex -Lipid panel -Hemoglobin A1c -Aspirin antiplatelet -PT/OT/SLP -RN bedside swallow-if passes allow RN to order diet -No hemorrhagic changes on CT head so appropriate to utilize pharmacological DVT prophylaxis -Check orthostatic vital signs 1  Active Problems:   HTN -Currently controlled -Continue preadmission ACE inhibitor    Gout -No signs of acute exacerbation -Continue preadmission allopurinol    DVT Prophylaxis: Lovenox  Family Communication:   Daughter and sons at bedside  Code Status: Full code   Condition:  Stable  Discharge disposition: Anticipate discharge back to home pending stroke evaluation results pending recommendations from PT/OT  Time spent in minutes : 60      ELLIS,ALLISON L. ANP on 04/11/2015 at 2:33 PM  Between 7am to 7pm - Pager - [phone]  After 7pm go to [email or web address] - password [email or web address]  And look for the night coverage person covering me after hours  [facility]   I have directly reviewed the clinical findings, lab results and imaging studies. I have interviewed and examined the patient and agree with the documentation and management as recorded by the Physician extender.  Patient seen by neurology, and likely to be enrolled to dual antiplatelet therapy trial.  Currently reported feeling  Better, still has residual left facial droop but denies dysarthria or dysphagia.  Lyniah Fujita M.D on 04/11/2015 at [facility] Office  [phone]

## 2015-04-11 NOTE — Consult Note (Signed)
Neurology Consultation Reason for Consult: Stroke Referring Physician: Audie Pinto, R  CC: Left-sided weakness  History is obtained from: Patient  HPI: Trevor Ward is a 74 y.o. male with a history of hypertension who was in his normal state of health this morning, and then went to a ballgame. He states that he felt "hot" and sit down. Per family member that is not here, he initially was not able to stand up, and that point that family member called the patient's son who is almost to the ball field. On his arrival, the patient had a facial droop at that point he was having some improvement and was able to stand up and walk.  He currently feels back to his normal self other than a persistent left facial droop   LKW: 8:45am tpa given?: no, mild symptoms    ROS: A 14 point ROS was performed and is negative except as noted in the HPI.   Past Medical History  Diagnosis Date  . Hypertension     Family History: Mother-stroke  Social History: Tob: Denies  Exam: Current vital signs: BP 116/50 mmHg  Pulse 67  Temp(Src) 97.8 F (36.6 C) (Oral)  Resp 19  SpO2 100% Vital signs in last 24 hours: Temp:  [97.6 F (36.4 C)-97.8 F (36.6 C)] 97.8 F (36.6 C) (07/17 1238) Pulse Rate:  [67-74] 67 (07/17 1300) Resp:  [19-25] 19 (07/17 1300) BP: (116-134)/(50-74) 116/50 mmHg (07/17 1300) SpO2:  [97 %-100 %] 100 % (07/17 1300)  Physical Exam  Constitutional: Appears well-developed and well-nourished.  Psych: Affect appropriate to situation Eyes: No scleral injection HENT: No OP obstrucion Head: Normocephalic.  Cardiovascular: Normal rate and regular rhythm.  Respiratory: Effort normal and breath sounds normal to anterior ascultation GI: Soft.  No distension. There is no tenderness.  Skin: WDI  Neuro: Mental Status: Patient is awake, alert, oriented to person, place, month, year, and situation. Patient is able to give a clear and coherent history. No signs of aphasia or  neglect Cranial Nerves: II: Visual Fields are full. Pupils are equal, round, and reactive to light.  He has no extinction to double simultaneous stimulation of the visual field III,IV, VI: EOMI without ptosis or diploplia.  V: Facial sensation is symmetric to temperature VII: Facial movement is notable for a left lower facial droop VIII: hearing is intact to voice X: Uvula elevates symmetrically XI: Shoulder shrug is symmetric. XII: tongue is midline without atrophy or fasciculations.  Motor: Tone is normal. Bulk is normal. 5/5 strength was present in all four extremities.  Sensory: Sensation is symmetric to light touch and temperature in the arms and legs. He has no extinction to double simultaneous stimulation Deep Tendon Reflexes: 2+ and symmetric in the biceps and patellae.  Plantars: Toes are downgoing bilaterally.  Cerebellar: FNF and HKS are intact bilaterally    I have reviewed labs in epic and the results pertinent to this consultation are: CMP-unremarkable  I have reviewed the images obtained: CT head-negative  Impression: 73 year old male who likely suffered a small stroke. I suspect he had a larger area at risk initially resulting in his difficulty standing and walking, with subsequent resolution of all but the facial droop. He is not taking any antiplatelets medication prior to arrival. I've discussed the point study and he is interested and therefore the study coordinator as coming in to discuss with him.  Recommendations: 1. HgbA1c, fasting lipid panel 2. MRI, MRA  of the brain without contrast 3. Frequent neuro checks  4. Echocardiogram 5. Carotid dopplers 6. Prophylactic therapy-Antiplatelet med: Per point study if patient enrolls, otherwise aspirin 325 mg daily 7. Risk factor modification 8. Telemetry monitoring 9. PT consult, OT consult, Speech consult   Roland Rack, MD [facility] [phone]  If 7pm- 7am, please page neurology on  call as listed in [place].

## 2015-04-11 NOTE — ED Provider Notes (Signed)
CSN: [id number]     Arrival date & time 04/11/15  1150 History   First MD Initiated Contact with Patient 04/11/15 1151     Chief Complaint  Patient presents with  . Stroke Symptoms    An emergency department physician performed an initial assessment on this suspected stroke patient at 88 (Dr. Audie Pinto). (Consider location/radiation/quality/duration/timing/severity/associated sxs/prior Treatment) HPI Per EMS, pt was out at a baseball game this morning with his family when he started having right sided facial droop and right sided weakness. Per EMS, pt had no symptoms upon EMS arrival. Stroke symptoms occurred at 1015 per EMS. Pt alert x4. VSS in route. Pt appears to have left sided facial droop when this RN first saw the pt. Dr. Audie Pinto called to the bedside to see if code stroke needs to be activated.  Past Medical History  Diagnosis Date  . Hypertension    History reviewed. No pertinent past surgical history. No family history on file. History  Substance Use Topics  . Smoking status: Former Research scientist (life sciences)  . Smokeless tobacco: Not on file  . Alcohol Use: No    Review of Systems  Unable to perform ROS: Acuity of condition      Allergies  Review of patient's allergies indicates no known allergies.  Home Medications   Prior to Admission medications   Medication Sig Start Date End Date Taking? Authorizing Provider  allopurinol (ZYLOPRIM) 100 MG tablet Take 100 mg by mouth 2 (two) times daily. 01/26/15  Yes Historical Provider, MD  amitriptyline (ELAVIL) 10 MG tablet Take 10 mg by mouth at bedtime.   Yes Historical Provider, MD  esomeprazole (NEXIUM) 40 MG capsule Take 40 mg by mouth daily at 12 noon.   Yes Historical Provider, MD  ibuprofen (ADVIL,MOTRIN) 200 MG tablet Take 400 mg by mouth 2 (two) times daily.    Yes Historical Provider, MD  lisinopril (PRINIVIL,ZESTRIL) 10 MG tablet Take 10 mg by mouth daily. 04/01/15  Yes Historical Provider, MD   BP 116/50 mmHg  Pulse 67  Temp(Src)  97.8 F (36.6 C) (Oral)  Resp 19  SpO2 100% Physical Exam Physical Exam  Nursing note and vitals reviewed. Constitutional: He is oriented to person, place, and time. He appears well-developed and well-nourished. No distress.  HENT:  Head: Normocephalic and atraumatic.  Eyes: Pupils are equal, round, and reactive to light.  Neck: Normal range of motion.  Cardiovascular: Normal rate and intact distal pulses.   Pulmonary/Chest: No respiratory distress.  Abdominal: Normal appearance. He exhibits no distension.  Musculoskeletal: Normal range of motion.  Neurological: He is alert and oriented to person, place, and time.  Left-sided facial droop noted to exam.  No evidence of weakness in arms or legs..  Skin: Skin is warm and dry. No rash noted.  Psychiatric: He has a normal mood and affect. His behavior is normal.   ED Course  Procedures (including critical care time) CRITICAL CARE Performed by: Leonard Schwartz L Total critical care time: 30 min Critical care time was exclusive of separately billable procedures and treating other patients. Critical care was necessary to treat or prevent imminent or life-threatening deterioration. Critical care was time spent personally by me on the following activities: development of treatment plan with patient and/or surrogate as well as nursing, discussions with consultants, evaluation of patient's response to treatment, examination of patient, obtaining history from patient or surrogate, ordering and performing treatments and interventions, ordering and review of laboratory studies, ordering and review of radiographic studies, pulse oximetry and re-evaluation  of patient's condition.  Labs Review Labs Reviewed  CBC - Abnormal; Notable for the following:    HCT 38.0 (*)    All other components within normal limits  COMPREHENSIVE METABOLIC PANEL - Abnormal; Notable for the following:    Calcium 8.6 (*)    Total Protein 5.9 (*)    Albumin 3.3 (*)    ALT  12 (*)    GFR calc non Af Amer 60 (*)    All other components within normal limits  ETHANOL  PROTIME-INR  APTT  DIFFERENTIAL  URINE RAPID DRUG SCREEN, HOSP PERFORMED  URINALYSIS, ROUTINE W REFLEX MICROSCOPIC (NOT AT [facility]  I-STAT CHEM 8, ED  I-STAT TROPOININ, ED  CBG MONITORING, ED    Imaging Review Ct Head Wo Contrast  04/11/2015   CLINICAL DATA:  Code stroke, right facial droop  EXAM: CT HEAD WITHOUT CONTRAST  TECHNIQUE: Contiguous axial images were obtained from the base of the skull through the vertex without intravenous contrast.  COMPARISON:  None.  FINDINGS: No evidence of parenchymal hemorrhage or extra-axial fluid collection. No mass lesion, mass effect, or midline shift.  No CT evidence of acute infarction.  Subcortical white matter and periventricular small vessel ischemic changes.  Mild cortical atrophy.  The visualized paranasal sinuses are essentially clear. The mastoid air cells are unopacified.  No evidence of calvarial fracture.  IMPRESSION: No evidence of acute intracranial abnormality.  Atrophy with small vessel ischemic changes.  These results were called by telephone at the time of interpretation on 04/11/2015 at 12:21 pm to Dr. Leonard Schwartz , who verbally acknowledged these results.   Electronically Signed   By: Julian Hy M.D.   On: 04/11/2015 12:21     EKG Interpretation None     Patient was seen and evaluated by neurology who recommended admission to hospitalist service.  Hospitalists were contacted and will admit patient. MDM   Final diagnoses:  Cerebral infarction due to unspecified mechanism        Leonard Schwartz, MD 04/11/15 1346

## 2015-04-11 NOTE — ED Notes (Signed)
Patient tried to obtain urine sample and was unable to do so.

## 2015-04-11 NOTE — ED Notes (Signed)
845 is the last seen normal per son.

## 2015-04-11 NOTE — ED Notes (Signed)
Activated Code Stroke 

## 2015-04-12 ENCOUNTER — Ambulatory Visit (HOSPITAL_COMMUNITY): Payer: Medicare Other

## 2015-04-12 ENCOUNTER — Inpatient Hospital Stay (HOSPITAL_COMMUNITY): Payer: Medicare Other

## 2015-04-12 DIAGNOSIS — I639 Cerebral infarction, unspecified: Secondary | ICD-10-CM

## 2015-04-12 DIAGNOSIS — I6789 Other cerebrovascular disease: Secondary | ICD-10-CM

## 2015-04-12 DIAGNOSIS — I10 Essential (primary) hypertension: Secondary | ICD-10-CM

## 2015-04-12 DIAGNOSIS — E785 Hyperlipidemia, unspecified: Secondary | ICD-10-CM | POA: Insufficient documentation

## 2015-04-12 LAB — BASIC METABOLIC PANEL
Anion gap: 8 (ref 5–15)
BUN: 8 mg/dL (ref 6–20)
CALCIUM: 8.8 mg/dL — AB (ref 8.9–10.3)
CO2: 25 mmol/L (ref 22–32)
Chloride: 108 mmol/L (ref 101–111)
Creatinine, Ser: 0.91 mg/dL (ref 0.61–1.24)
GFR calc Af Amer: >60 mL/min (ref >60)
GFR calc non Af Amer: >60 mL/min (ref >60)
Glucose, Bld: 114 mg/dL — ABNORMAL HIGH (ref 65–99)
Potassium: 4.2 mmol/L (ref 3.5–5.1)
SODIUM: 141 mmol/L (ref 135–145)

## 2015-04-12 LAB — LIPID PANEL
CHOL/HDL RATIO: 7 ratio
CHOLESTEROL: 181 mg/dL (ref 0–200)
HDL: 26 mg/dL — ABNORMAL LOW (ref >40)
LDL CALC: 130 mg/dL — AB (ref 0–99)
Triglycerides: 126 mg/dL (ref <150)
VLDL: 25 mg/dL (ref 0–40)

## 2015-04-12 MED ORDER — CALCIUM CARBONATE ANTACID 500 MG PO CHEW
1.0000 | CHEWABLE_TABLET | Freq: Three times a day (TID) | ORAL | Status: DC
  Administered 2015-04-13 (×3): 200 mg via ORAL
  Filled 2015-04-12 (×4): qty 1

## 2015-04-12 MED ORDER — IOHEXOL 350 MG/ML SOLN
50.0000 mL | Freq: Once | INTRAVENOUS | Status: AC | PRN
  Administered 2015-04-12: 50 mL via INTRAVENOUS

## 2015-04-12 MED ORDER — SIMVASTATIN 20 MG PO TABS
40.0000 mg | ORAL_TABLET | Freq: Every day | ORAL | Status: DC
  Administered 2015-04-12 – 2015-04-14 (×3): 40 mg via ORAL
  Filled 2015-04-12 (×2): qty 1
  Filled 2015-04-12: qty 2

## 2015-04-12 NOTE — Evaluation (Signed)
Physical Therapy Evaluation and Discharge  Patient Details Name: Trevor Ward MRN: [id number] DOB: September 27, 1940 Today's Date: 04/12/2015   History of Present Illness  Pt is a 74 yo male who came in with L facial droop and gait instability. MRI revelave suble acute lacunar infarct in R internal capsule.  Clinical Impression  Pt presenting with above. Pt functioning at baseline. Pt at minimal falls risk as indicated by score of 22/25 on DGI. Pt safe to d/c home once medically stable. Pt with no further acute PT needs at this time. Please re-consult if needed in future. PT SIGNING OFF.    Follow Up Recommendations No PT follow up;Supervision - Intermittent    Equipment Recommendations  Other (comment)    Recommendations for Other Services       Precautions / Restrictions Precautions Precautions: None Restrictions Weight Bearing Restrictions: No      Mobility  Bed Mobility Overal bed mobility: Independent             General bed mobility comments: safe technique  Transfers Overall transfer level: Independent Equipment used: None             General transfer comment: steady, slightly impulsive however suspect due to extreme [facility]  Ambulation/Gait Ambulation/Gait assistance: Independent Ambulation Distance (Feet): 510 Feet Assistive device: None Gait Pattern/deviations: Step-through pattern;WFL(Within Functional Limits) Gait velocity: wfl   General Gait Details: no episodes of LOB  Stairs Stairs: Yes Stairs assistance: Modified independent (Device/Increase time) Stair Management: One rail Left Number of Stairs: 12 General stair comments: v/c's to slow down, used rail to ascend, no rail to descend  Wheelchair Mobility    Modified Rankin (Stroke Patients Only) Modified Rankin (Stroke Patients Only) Pre-Morbid Rankin Score: No symptoms Modified Rankin: No significant disability     Balance                                 Standardized  Balance Assessment Standardized Balance Assessment : Dynamic Gait Index   Dynamic Gait Index Level Surface: Normal Change in Gait Speed: Mild Impairment Gait with Horizontal Head Turns: Normal Gait with Vertical Head Turns: Normal Gait and Pivot Turn: Normal Step Over Obstacle: Normal Step Around Obstacles: Normal Steps: Mild Impairment Total Score: 22       Pertinent Vitals/Pain Pain Assessment: No/denies pain    Home Living Family/patient expects to be discharged to:: Private residence Living Arrangements: Spouse/significant other Available Help at Discharge: Family;Friend(s);Available PRN/intermittently (wife works during the day) Type of Home: House Home Access: Stairs to enter Entrance Stairs-Rails: Horticulturist, commercial of Steps: 5-6 Home Layout: One level Home Equipment: None      Prior Function Level of Independence: Independent         Comments: 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, 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     Engineer, manufacturing [employer]   Dominant Hand: Right    Extremity/Trunk Assessment   Upper Extremity Assessment: Overall WFL for tasks assessed           Lower Extremity Assessment: Overall WFL for tasks assessed      Cervical / Trunk Assessment: Normal  Communication   Communication: HOH (extremely, has no hearing AIDE R worse than L)  Cognition Arousal/Alertness: Awake/alert Behavior During Therapy: Impulsive (however suspect to [facility]) Overall Cognitive Status: Within Functional Limits for tasks assessed  General Comments      Exercises        Assessment/Plan    PT Assessment Patent does not need any further PT services  PT Diagnosis Difficulty walking   PT Problem List    PT Treatment Interventions     PT Goals (Current goals can be found in the Care Plan section) Acute Rehab PT Goals Patient Stated Goal: home today PT Goal Formulation: All assessment and education complete, DC therapy    Frequency      Barriers to discharge        Co-evaluation               End of Session Equipment Utilized During Treatment: Gait belt Activity Tolerance: Patient tolerated treatment well Patient left: in chair;with call bell/phone within reach Nurse Communication: Mobility status         Time: 0742-0801 PT Time Calculation (min) (ACUTE ONLY): 19 min   Charges:   PT Evaluation $Initial PT Evaluation Tier I: 1 Procedure     PT G CodesKingsley Callander 04/12/2015, 9:40 AM   Kittie Plater, PT, DPT Pager #: [phone] Office #: [phone]

## 2015-04-12 NOTE — Progress Notes (Addendum)
*  PRELIMINARY RESULTS* Vascular Ultrasound Carotid Duplex (Doppler) has been completed.  Preliminary findings: Right = 40-59% ICA stenosis. Left = 1-39% ICA stenosis.  Landry Mellow, RDMS, RVT  04/12/2015, 12:06 PM

## 2015-04-12 NOTE — Progress Notes (Addendum)
PROGRESS NOTE  Trevor Ward SNK:[id number] DOB: January 23, 1941 DOA: 04/11/2015 PCP: Mayra Neer, MD  Assessment/Plan: CVA (cerebral infarction) -telemetry -Appreciate neurology assistance -MRI/MRA brain: . Subtle acute lacunar infarct in the right internal capsule, posterior limb near the genu. No mass effect or hemorrhage. -Echocardiogram -Carotid duplex -Lipid panel- LDL < 70 -Hemoglobin A1c -Aspirin antiplatelet- POINT STUDY -PT/OT/SLP  HLD -statin   HTN -Currently controlled -Continue preadmission ACE inhibitor   Gout -No signs of acute exacerbation -Continue preadmission allopurinol   Code Status: full Family Communication: patient Disposition Plan:    Consultants:  neuro  Procedures:      HPI/Subjective: Wants to go home  Objective: Filed Vitals:   04/12/15 0605  BP: 141/78  Pulse: 74  Temp: 97.4 F (36.3 C)  Resp: 18   No intake or output data in the 24 hours ending 04/12/15 0816 Filed Weights   04/11/15 1451  Weight: 83.144 kg (183 lb 4.8 oz)    Exam:   General:  A+Ox3, NAD  Cardiovascular: rrr  Respiratory: clear  Abdomen: +BS, soft  Musculoskeletal: no edema  Data Reviewed: Basic Metabolic Panel:  Recent Labs Lab 04/11/15 1218 04/11/15 1225 04/12/15 0623  NA 137 139 141  K 4.5 4.5 4.2  CL 105 102 108  CO2 25  --  25  GLUCOSE 91 86 114*  BUN 13 15 8   CREATININE 1.15 1.10 0.91  CALCIUM 8.6*  --  8.8*   Liver Function Tests:  Recent Labs Lab 04/11/15 1218  AST 16  ALT 12*  ALKPHOS 60  BILITOT 0.3  PROT 5.9*  ALBUMIN 3.3*   No results for input(s): LIPASE, AMYLASE in the last 168 hours. No results for input(s): AMMONIA in the last 168 hours. CBC:  Recent Labs Lab 04/11/15 1218 04/11/15 1225  WBC 6.8  --   NEUTROABS 4.8  --   HGB 13.1 13.9  HCT 38.0* 41.0  MCV 88.2  --   PLT 206  --    Cardiac Enzymes: No results for input(s): CKTOTAL, CKMB, CKMBINDEX, TROPONINI in the last 168 hours. BNP  (last 3 results) No results for input(s): BNP in the last 8760 hours.  ProBNP (last 3 results) No results for input(s): PROBNP in the last 8760 hours.  CBG:  Recent Labs Lab 04/11/15 1216  GLUCAP 80    No results found for this or any previous visit (from the past 240 hour(s)).   Studies: Dg Chest 2 View  04/11/2015   CLINICAL DATA:  74 year old male with hypertension and stroke  EXAM: CHEST  2 VIEW  COMPARISON:  Radiograph dated 01/01/2013  FINDINGS: The heart size and mediastinal contours are within normal limits. Both lungs are clear. The visualized skeletal structures are unremarkable.  IMPRESSION: No active cardiopulmonary disease.   Electronically Signed   By: Anner Crete M.D.   On: 04/11/2015 19:30   Ct Head Wo Contrast  04/11/2015   CLINICAL DATA:  Code stroke, right facial droop  EXAM: CT HEAD WITHOUT CONTRAST  TECHNIQUE: Contiguous axial images were obtained from the base of the skull through the vertex without intravenous contrast.  COMPARISON:  None.  FINDINGS: No evidence of parenchymal hemorrhage or extra-axial fluid collection. No mass lesion, mass effect, or midline shift.  No CT evidence of acute infarction.  Subcortical white matter and periventricular small vessel ischemic changes.  Mild cortical atrophy.  The visualized paranasal sinuses are essentially clear. The mastoid air cells are unopacified.  No evidence of calvarial fracture.  IMPRESSION: No  evidence of acute intracranial abnormality.  Atrophy with small vessel ischemic changes.  These results were called by telephone at the time of interpretation on 04/11/2015 at 12:21 pm to Dr. Leonard Schwartz , who verbally acknowledged these results.   Electronically Signed   By: Julian Hy M.D.   On: 04/11/2015 12:21   Mr Jodene Nam Head Wo Contrast  04/11/2015   CLINICAL DATA:  74 year old male with acute onset weakness, unable to stand. Facial droop. Initial encounter.  EXAM: MRI HEAD WITHOUT CONTRAST  MRA HEAD WITHOUT  CONTRAST  TECHNIQUE: Multiplanar, multiecho pulse sequences of the brain and surrounding structures were obtained without intravenous contrast. Angiographic images of the head were obtained using MRA technique without contrast.  COMPARISON:  Head CT without contrast 1217 hours today, and earlier.  FINDINGS: MRI HEAD FINDINGS  In there is a small 4 mm somewhat vague focus of asymmetric diffusion signal in the posterior limb of the right internal capsule near the genu (series 5, image 25. Minimal if any associated T2 hyperintensity. No hemorrhage or associated mass effect.  No other restricted diffusion. Major intracranial vascular flow voids are within normal limits.  No midline shift, mass effect, evidence of mass lesion, ventriculomegaly, extra-axial collection or acute intracranial hemorrhage. Cervicomedullary junction and pituitary are within normal limits.  No cortical encephalomalacia. No definite chronic cerebral blood products. Incidental pneumatized posterior clinoid process. Tiny chronic lacunar infarct in the right caudate head. Minimal cerebral white matter T2 and FLAIR hyperintensity, greatest in the right corona radiata.  Visible internal auditory structures appear normal. Visualized paranasal sinuses and mastoids are clear. Orbit and scalp soft tissues are within normal limits. Normal bone marrow signal. Negative visualized cervical spine.  MRA HEAD FINDINGS  Antegrade flow in the posterior circulation with dominant distal left vertebral artery. Normal left PICA origin. Tortuous but otherwise normal vertebrobasilar junction. No basilar artery stenosis. SCA and PCA origins are patent. Normal right PCA. There is mild irregularity and stenosis at the left PCA P1 segment, and possibly also at the P2 segment branching although there is MOTSA artifact also at that level. Bilateral distal PCA flow is within normal limits. Posterior communicating arteries are diminutive or absent.  Antegrade flow in both ICA  siphons. Mild siphon dolichoectasia on the right. No siphon stenosis. Ophthalmic artery origins and carotid termini are normal. Dominant right ACA A1 segment. Anterior communicating artery and visualized bilateral ACA branches are within normal limits. Normal bilateral MCA origins. Visualized left MCA branches are within normal limits. Right MCA M1 segment and visualized right MCA branches are within normal limits.  IMPRESSION: 1. Subtle acute lacunar infarct in the right internal capsule, posterior limb near the genu. No mass effect or hemorrhage. 2. No associated intracranial MRA finding. There is evidence of left PCA atherosclerosis with mild to moderate stenosis. 3. Other minimal to mild for age chronic small vessel disease.   Electronically Signed   By: Genevie Ann M.D.   On: 04/11/2015 16:53   Mr Brain Wo Contrast  04/11/2015   CLINICAL DATA:  74 year old male with acute onset weakness, unable to stand. Facial droop. Initial encounter.  EXAM: MRI HEAD WITHOUT CONTRAST  MRA HEAD WITHOUT CONTRAST  TECHNIQUE: Multiplanar, multiecho pulse sequences of the brain and surrounding structures were obtained without intravenous contrast. Angiographic images of the head were obtained using MRA technique without contrast.  COMPARISON:  Head CT without contrast 1217 hours today, and earlier.  FINDINGS: MRI HEAD FINDINGS  In there is a small 4 mm  somewhat vague focus of asymmetric diffusion signal in the posterior limb of the right internal capsule near the genu (series 5, image 25. Minimal if any associated T2 hyperintensity. No hemorrhage or associated mass effect.  No other restricted diffusion. Major intracranial vascular flow voids are within normal limits.  No midline shift, mass effect, evidence of mass lesion, ventriculomegaly, extra-axial collection or acute intracranial hemorrhage. Cervicomedullary junction and pituitary are within normal limits.  No cortical encephalomalacia. No definite chronic cerebral blood  products. Incidental pneumatized posterior clinoid process. Tiny chronic lacunar infarct in the right caudate head. Minimal cerebral white matter T2 and FLAIR hyperintensity, greatest in the right corona radiata.  Visible internal auditory structures appear normal. Visualized paranasal sinuses and mastoids are clear. Orbit and scalp soft tissues are within normal limits. Normal bone marrow signal. Negative visualized cervical spine.  MRA HEAD FINDINGS  Antegrade flow in the posterior circulation with dominant distal left vertebral artery. Normal left PICA origin. Tortuous but otherwise normal vertebrobasilar junction. No basilar artery stenosis. SCA and PCA origins are patent. Normal right PCA. There is mild irregularity and stenosis at the left PCA P1 segment, and possibly also at the P2 segment branching although there is MOTSA artifact also at that level. Bilateral distal PCA flow is within normal limits. Posterior communicating arteries are diminutive or absent.  Antegrade flow in both ICA siphons. Mild siphon dolichoectasia on the right. No siphon stenosis. Ophthalmic artery origins and carotid termini are normal. Dominant right ACA A1 segment. Anterior communicating artery and visualized bilateral ACA branches are within normal limits. Normal bilateral MCA origins. Visualized left MCA branches are within normal limits. Right MCA M1 segment and visualized right MCA branches are within normal limits.  IMPRESSION: 1. Subtle acute lacunar infarct in the right internal capsule, posterior limb near the genu. No mass effect or hemorrhage. 2. No associated intracranial MRA finding. There is evidence of left PCA atherosclerosis with mild to moderate stenosis. 3. Other minimal to mild for age chronic small vessel disease.   Electronically Signed   By: Genevie Ann M.D.   On: 04/11/2015 16:53    Scheduled Meds: . allopurinol  100 mg Oral BID  . amitriptyline  10 mg Oral QHS  . aspirin  300 mg Rectal Daily   Or  .  aspirin  325 mg Oral Daily  . lisinopril  10 mg Oral Daily  . pantoprazole  40 mg Oral Daily  . research study medication  75 mg Oral Q breakfast   Continuous Infusions:  Antibiotics Given (last 72 hours)    None      Principal Problem:   CVA (cerebral infarction) Active Problems:   HTN (hypertension)   Gout    Time spent: 25 min    Derris Millan  [facility] Pager [phone]. If 7PM-7AM, please contact night-coverage at [email or web address], password [facility] 04/12/2015, 8:16 AM  LOS: 1 day

## 2015-04-12 NOTE — Progress Notes (Signed)
STROKE TEAM PROGRESS NOTE   HISTORY Trevor Ward is a 74 y.o. male with a history of hypertension who was in his normal state of health this morning, and then went to a ballgame (LKW 04/11/2015 at [place]). He states that he felt "hot" and sit down. Per family member that is not here, he initially was not able to stand up, and that point that family member called the patient's son who is almost to the ball field. On his arrival, the patient had a facial droop at that point he was having some improvement and was able to stand up and walk. He currently feels back to his normal self other than a persistent left facial droop. Patient was not administered TPA secondary to mild symptoms. He was admitted for further evaluation and treatment.   SUBJECTIVE (INTERVAL HISTORY) Trevor Ward is at the bedside.  Overall he feels his condition is stable. He has been enrolled into POINT trial.    OBJECTIVE Temp:  [97.4 F (36.3 C)-98.3 F (36.8 C)] 97.4 F (36.3 C) (07/18 0605) Pulse Rate:  [62-93] 74 (07/18 0605) Cardiac Rhythm:  [-] Bundle branch block (07/18 0814) Resp:  [17-22] 18 (07/18 0605) BP: (108-159)/(50-89) 141/78 mmHg (07/18 0605) SpO2:  [98 %-100 %] 100 % (07/18 0605) Weight:  [83.144 kg (183 lb 4.8 oz)] 83.144 kg (183 lb 4.8 oz) (07/17 1451)   Recent Labs Lab 04/11/15 1216  GLUCAP 80    Recent Labs Lab 04/11/15 1218 04/11/15 1225 04/12/15 0623  NA 137 139 141  K 4.5 4.5 4.2  CL 105 102 108  CO2 25  --  25  GLUCOSE 91 86 114*  BUN 13 15 8   CREATININE 1.15 1.10 0.91  CALCIUM 8.6*  --  8.8*    Recent Labs Lab 04/11/15 1218  AST 16  ALT 12*  ALKPHOS 60  BILITOT 0.3  PROT 5.9*  ALBUMIN 3.3*    Recent Labs Lab 04/11/15 1218 04/11/15 1225  WBC 6.8  --   NEUTROABS 4.8  --   HGB 13.1 13.9  HCT 38.0* 41.0  MCV 88.2  --   PLT 206  --    No results for input(s): CKTOTAL, CKMB, CKMBINDEX, TROPONINI in the last 168 hours.  Recent Labs  04/11/15 1218  LABPROT 13.9   INR 1.05    Recent Labs  04/11/15 1711  COLORURINE YELLOW  LABSPEC 1.007  PHURINE 6.5  GLUCOSEU NEGATIVE  HGBUR NEGATIVE  BILIRUBINUR NEGATIVE  KETONESUR NEGATIVE  PROTEINUR NEGATIVE  UROBILINOGEN 0.2  NITRITE NEGATIVE  LEUKOCYTESUR NEGATIVE       Component Value Date/Time   CHOL 181 04/12/2015 0623   TRIG 126 04/12/2015 0623   HDL 26* 04/12/2015 0623   CHOLHDL 7.0 04/12/2015 0623   VLDL 25 04/12/2015 0623   LDLCALC 130* 04/12/2015 0623   No results found for: HGBA1C    Component Value Date/Time   LABOPIA NONE DETECTED 04/11/2015 1710   COCAINSCRNUR NONE DETECTED 04/11/2015 1710   LABBENZ NONE DETECTED 04/11/2015 1710   AMPHETMU NONE DETECTED 04/11/2015 1710   THCU NONE DETECTED 04/11/2015 1710   LABBARB NONE DETECTED 04/11/2015 1710     Recent Labs Lab 04/11/15 1218  ETH <5    Dg Chest 2 View 04/11/2015    No active cardiopulmonary disease.      Ct Head Wo Contrast 04/11/2015    No evidence of acute intracranial abnormality.  Atrophy with small vessel ischemic changes.    MRI & MRA Head Wo Contrast  04/11/2015    1. Subtle acute lacunar infarct in the right internal capsule, posterior limb near the genu. No mass effect or hemorrhage. 2. No associated intracranial MRA finding. There is evidence of left PCA atherosclerosis with mild to moderate stenosis. 3. Other minimal to mild for age chronic small vessel disease.    Carotid Doppler  Right = 40-59% ICA stenosis, based on systolic velocity and 8-30% based on diastolic velocity. Left = 1-39% ICA stenosis.  2D Echocardiogram   - Left ventricle: The cavity size was normal. There was moderateconcentric hypertrophy. Systolic function was normal. Theestimated ejection fraction was in the range of 60% to 65%. Wallmotion was normal; there were no regional wall motionabnormalities. There was an increased relative contribution ofatrial contraction to ventricular filling. Doppler parameters areconsistent with  abnormal left ventricular relaxation (grade 1diastolic dysfunction). - Aortic valve: Moderate diffuse thickening and calcification,consistent with sclerosis. There was mild regurgitation.  CTA neck - pending   PHYSICAL EXAM  Temp:  [97.4 F (36.3 C)-98.6 F (37 C)] 98.6 F (37 C) (07/18 2119) Pulse Rate:  [62-81] 81 (07/18 2119) Resp:  [17-18] 18 (07/18 2119) BP: (108-141)/(59-78) 133/63 mmHg (07/18 2119) SpO2:  [97 %-100 %] 98 % (07/18 2119)  General - Well nourished, well developed, in no apparent distress.  Ophthalmologic - Fundi not visualized due to eye movement.  Cardiovascular - Regular rate and rhythm with no murmur.  Mental Status -  Level of arousal and orientation to time, place, and person were intact. Language including expression, naming, repetition, comprehension was assessed and found intact. Fund of Knowledge was assessed and was impaired.  Cranial Nerves II - XII - II - Visual field intact OU. III, IV, VI - Extraocular movements intact. V - Facial sensation intact bilaterally. VII - right nasolabial fold flattening. VIII - Hearing & vestibular intact bilaterally. X - Palate elevates symmetrically. XI - Chin turning & shoulder shrug intact bilaterally. XII - Tongue protrusion intact.  Motor Strength - [name] strength was normal in all extremities and pronator drift was absent.  Bulk was normal and fasciculations were absent.   Motor Tone - Muscle tone was assessed at the neck and appendages and was normal.  Reflexes - [name] reflexes were 1+ in all extremities and he had no pathological reflexes.  Sensory - Light touch, temperature/pinprick were assessed and were symmetrical.    Coordination - The patient had normal movements in the hands and feet with no ataxia or dysmetria.  Tremor was absent.  Gait and Station - deferred due to safety concerns.  ASSESSMENT/PLAN Trevor Ward is a 74 y.o. male with history of hypertension  presenting with Left-sided weakness. He did not receive IV t-PA due to mild symptoms .   Stroke:  Non-dominant right IC/PLIC infarct secondary to small vessel disease source  Resultant  Mild left facial weakness  MRI   right IC/PLIC infarct   MRA  Mild L PCA stenosis  Carotid Doppler  R - 40-59% ICA stenosis, based on systolic velocity and 9-40% based on diastolic velocity.    CTA neck - pending  2D Echo  No source of embolus   LDL 130  HgbA1c pending  SCDs for VTE prophylaxis Diet Heart Room service appropriate?: Yes; Fluid consistency:: Thin  no antithrombotic prior to admission, now on aspirin 325 mg orally every day and and POINT study drug. Patient is enrollled in [place] is a randomized, double-blind, multicenter clinical trial to determine whether clopidogrel 75mg /day (  after a loading dose of 600mg ) is effective in improving survival free from major ischemic vascular events (ischemic stroke, myocardial infarction, and ischemic vascular death) at 90 days when initiated within 12 hours time last known free of new ischemic symptoms of TIA or minor ischemic stroke in subjects receiving aspirin 50-325mg /day. Please contact [facility] at [phone] for any questions. Continue drug at discharge; order has been written to dispense.   Patient counseled to be compliant with his antithrombotic medications  Ongoing aggressive stroke risk factor management  Therapy recommendations:  No PT  Disposition:  Return home  Follow up Dr. Leonie Ward 2 mos, stroke clinic. Order written.  Hypertension  Stable Permissive hypertension (OK if <220/120) for 24-48 hours post stroke and then gradually normalized within 5-7 days.  Hyperlipidemia  Home meds:  No statin  LDL 130, goal < 70  On Zocor 40 mg daily now  Continue statin at discharge  Other Stroke Risk Factors  Advanced age  Family hx stroke (mother)  Hospital day # [place] for Pager information 04/12/2015 3:08 PM   I, the attending vascular neurologist, have personally obtained a history, examined the patient, evaluated laboratory data, individually viewed imaging studies and agree with radiology interpretations. I obtained additional history from pt's Trevor Ward at bedside. Together with the NP/PA, we formulated the assessment and plan of care which reflects our mutual decision.  I have made any additions or clarifications directly to the above note and agree with the findings and plan as currently documented.   74 yo M with hx of HTN admitted for right IC infarct consistent with small vessel disease. However, CUS showed right ICA 40-59% stenosis. Will do CTA neck for further evaluation. Other stroke w/u negative including MRA and TTE. LDL 130, put on zocor. A1C pending. Continue ASA ans statin. Pt enrolled in POINT trial also.   Trevor Hawking, MD PhD Stroke Neurology 04/12/2015 9:27 PM       To contact Stroke Continuity provider, please refer to [email or web address] After hours, contact General Neurology

## 2015-04-12 NOTE — Progress Notes (Signed)
  Echocardiogram 2D Echocardiogram has been performed.  [name] 04/12/2015, 11:35 AM

## 2015-04-13 DIAGNOSIS — E785 Hyperlipidemia, unspecified: Secondary | ICD-10-CM

## 2015-04-13 DIAGNOSIS — I6521 Occlusion and stenosis of right carotid artery: Secondary | ICD-10-CM

## 2015-04-13 DIAGNOSIS — I63311 Cerebral infarction due to thrombosis of right middle cerebral artery: Secondary | ICD-10-CM

## 2015-04-13 LAB — HEMOGLOBIN A1C
Hgb A1c MFr Bld: 5.9 % — ABNORMAL HIGH (ref 4.8–5.6)
Mean Plasma Glucose: 123 mg/dL

## 2015-04-13 NOTE — Evaluation (Signed)
Occupational Therapy Evaluation Patient Details Name: ISIDORO SANTILLANA MRN: [id number] DOB: 07/29/1941 Today's Date: 04/13/2015    History of Present Illness Pt is a 74 yo male who came in with L facial droop and gait instability. MRI revelave suble acute lacunar infarct in R internal capsule.   Clinical Impression   Pt and spouse report he is functioning at baseline, and able to complete ADLs with supervision. Pt denies sensation or vision deficits. Pt safe to d/c home when medically stable. No further acute OT needs at this time.    Follow Up Recommendations  No OT follow up;Supervision - Intermittent    Equipment Recommendations  None recommended by OT    Recommendations for Other Services       Precautions / Restrictions Precautions Precautions: None Restrictions Weight Bearing Restrictions: No      Mobility Bed Mobility Overal bed mobility: Independent                Transfers Overall transfer level: Independent Equipment used: None                  Balance Overall balance assessment: No apparent balance deficits (not formally assessed)                                          ADL Overall ADL's : At baseline                                       General ADL Comments: Pt completing ADLs with supervision     Vision Vision Assessment?: Yes Eye Alignment: Within Functional Limits Ocular Range of Motion: Within Functional Limits Alignment/Gaze Preference: Within Defined Limits Tracking/Visual Pursuits: Able to track stimulus in all quads without difficulty Saccades: Within functional limits Convergence: Impaired (comment) Visual Fields: No apparent deficits   Perception     Praxis      Pertinent Vitals/Pain Pain Assessment: No/denies pain     Hand Dominance Right   Extremity/Trunk Assessment Upper Extremity Assessment Upper Extremity Assessment: Overall WFL for tasks assessed   Lower Extremity  Assessment Lower Extremity Assessment: Overall WFL for tasks assessed   Cervical / Trunk Assessment Cervical / Trunk Assessment: Normal   Communication Communication Communication: HOH   Cognition Arousal/Alertness: Awake/alert Behavior During Therapy: WFL for tasks assessed/performed Overall Cognitive Status: Within Functional Limits for tasks assessed                     General Comments       Exercises       Shoulder Instructions      Home Living Family/patient expects to be discharged to:: Private residence Living Arrangements: Spouse/significant other Available Help at Discharge: Family;Friend(s);Available PRN/intermittently Type of Home: House Home Access: Stairs to enter [employer] of Steps: 5-6 Entrance Stairs-Rails: Left Home Layout: One level     Bathroom Shower/Tub: Tub/shower unit Shower/tub characteristics: Architectural technologist: Standard     Home Equipment: Kasandra Knudsen - single point          Prior Functioning/Environment Level of Independence: Independent        Comments: 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, 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    OT Diagnosis: Generalized weakness   OT Problem List:     OT Treatment/Interventions:  OT Goals(Current goals can be found in the care plan section) Acute Rehab OT Goals Patient Stated Goal: to go home OT Goal Formulation: With patient Time For Goal Achievement: 04/27/15 Potential to Achieve Goals: Good  OT Frequency:     Barriers to D/C:            Co-evaluation              End of Session Equipment Utilized During Treatment: Gait belt Nurse Communication: Mobility status  Activity Tolerance: Patient tolerated treatment well Patient left: in chair;with call bell/phone within reach;with family/visitor present   Time: 1100-1119 OT Time Calculation (min): 19 min Charges:  OT General Charges $OT Visit: 1 Procedure OT Evaluation $Initial OT Evaluation Tier I: 1  Procedure G-Codes:    Forest Gleason 04/13/2015, 1:18 PM

## 2015-04-13 NOTE — Evaluation (Signed)
Speech Language Pathology Evaluation Patient Details Name: Trevor Ward MRN: [id number] DOB: 01-16-1941 Today's Date: 04/13/2015 Time: 0920-0950 SLP Time Calculation (min) (ACUTE ONLY): 30 min  Problem List:  Patient Active Problem List   Diagnosis Date Noted  . Carotid stenosis   . Stroke   . HLD (hyperlipidemia)   . HTN (hypertension) 04/11/2015  . Gout 04/11/2015  . CVA (cerebral infarction) 04/11/2015   Past Medical History:  Past Medical History  Diagnosis Date  . Hypertension    Past Surgical History: History reviewed. No pertinent past surgical history. HPI:  Pt is a 74 yo male who continues to work as a Producer, television/film/video with right sided weakness, right eye droop and left facial droop.  PMH + for HTN.  MRI showed an acute right ICA CVA.  Speech cog eval ordered.    Assessment / Plan / Recommendation Clinical Impression  Per pt and family (*pt's spouse of 31 years and daughter) no changes with speech, language or cognition apparent.  Pt language (expressive and receptive) intact for his environment.  Pt did demonstrate decreased retreival of 4 words within 10 minutes - he recalled 1 word independently and 2 with category cue and 1 with multiple choice.   Pt is responsible for mowing year, taking care of his dogs and taking out trash at home.  SLP provided pt with memory compensation strategies to utilize as indicated.  Good problem solving demonstrated by pt stating he brings his family with him to help recall information.  In addition, he recognizes concerns for roofing with is age/cva pertaining to falls = demonstrating good judgement.  SLP to sign off as all education completed and pt is near baseline function per pt/family.      SLP Assessment  Patient does not need any further Speech Lanaguage Pathology Services    Follow Up Recommendations  None    Frequency and Duration   n/a     Pertinent Vitals/Pain  afebrile, chronic right shoulder pain-per pt questions arthritis  and monitored during session   SLP Goals  Patient/Family Stated Goal: none stated  SLP Evaluation Prior Functioning  Type of Home: House Available Help at Discharge: Family;Friend(s);Available PRN/intermittently (wife works during the day) Education: 7th grade   Cognition  Orientation Level: Oriented X4 ("they think I had a stroke") Memory: Impaired Memory Impairment: Retrieval deficit;Other (comment) (recalled seeing PT yesterday, recalled 1 of 4 words independently, 3 with cues *catergory x2 and multiple choice x1) Awareness: Appears intact Problem Solving: Appears intact (for basic, informs daughter when he does not hear her) Safety/Judgment: Appears intact (dangers of considering roofing again with his cva)    Comprehension  Auditory Comprehension Overall Auditory Comprehension: Appears within functional limits for tasks assessed Yes/No Questions: Not tested Commands: Within Functional Limits Conversation: Complex Other Conversation Comments: conversation regarding 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 appearance of left facial droop) Velum: Within Functional Limits Mandible: Within Functional Limits Motor Speech Overall Motor Speech: Appears within functional limits for tasks assessed Resonance: Within functional limits Articulation: Within functional  limitis  Intelligibility: Intelligible Motor Planning: Witnin functional limits Motor Speech Errors: Not applicable   Van, [facility] SLP [phone]

## 2015-04-13 NOTE — Care Management Important Message (Signed)
Important Message  Patient Details  Name: Trevor Ward MRN: [id number] Date of Birth: 1941-09-12   Medicare Important Message Given:  Yes-second notification given    Pricilla Handler 04/13/2015, 11:53 AM

## 2015-04-13 NOTE — Consult Note (Signed)
Hospital Consult    Reason for Consult:  Right carotid artery stenosis - symptomatic Referring Physician:  Dr. Eliseo Squires MRN #:  [id number]  History of Present Illness: This is a 74 y.o. male who states he was at a ball game when he starting having right sided weakness and left sided facial droop.  He was having difficulty walking as he was not able to put any weight on the right side of his body.  Pt states that he was feeling hot and felt like he needed rest.  He was brought to the ER and was found to have a subtle acute lacunar infarct in the right internal capsule, posterior limb near the genu.  There was no mass effect or hemorrhage.   A carotid duplex was obtained and revealed 40-59% stenosis of the right ICA and 1-39% stenosis of the left ICA.  He does take an ACEI for hypertension.  He was not on a statin at home, but has been placed on one here.    [facility] is consulted.   Past Medical History  Diagnosis Date  . Hypertension     Past surgical hx: Hx of knee surgery   No Known Allergies  Prior to Admission medications   Medication Sig Start Date End Date Taking? Authorizing Provider  allopurinol (ZYLOPRIM) 100 MG tablet Take 100 mg by mouth 2 (two) times daily. 01/26/15  Yes Historical Provider, MD  amitriptyline (ELAVIL) 10 MG tablet Take 10 mg by mouth at bedtime.   Yes Historical Provider, MD  esomeprazole (NEXIUM) 40 MG capsule Take 40 mg by mouth daily at 12 noon.   Yes Historical Provider, MD  ibuprofen (ADVIL,MOTRIN) 200 MG tablet Take 400 mg by mouth 2 (two) times daily.    Yes Historical Provider, MD  lisinopril (PRINIVIL,ZESTRIL) 10 MG tablet Take 10 mg by mouth daily. 04/01/15  Yes Historical Provider, MD    History   Social History  . Marital Status: Married    Spouse Name: N/A  . Number of Children: N/A  . Years of Education: N/A   Occupational History  . Not on file.   Social History Main Topics  . Smoking status: Former Research scientist (life sciences)  . Smokeless tobacco: Not on  file  . Alcohol Use: No  . Drug Use: No  . Sexual Activity: Not on file   Other Topics Concern  . Not on file   Social History Narrative    Family Hx: Several brothers with hx of stroke Mother with hx of stroke  ROS: [x]  Positive   [ ]  Negative   [ ]  All sytems reviewed and are negative  Cardiovascular: []  chest pain/pressure []  palpitations []  SOB lying flat []  DOE []  pain in legs while walking []  pain in legs at rest []  pain in legs at night []  non-healing ulcers []  hx of DVT []  swelling in legs  Pulmonary: []  productive cough []  asthma/wheezing []  home O2  Neurologic: [x]  weakness in [x]  right arm [x]  right leg [x]  left facial droop []  numbness in []  arms []  legs [x]  hx of CVA []  mini stroke [] difficulty speaking or slurred speech []  temporary loss of vision in one eye []  dizziness  Hematologic: []  hx of cancer []  bleeding problems []  problems with blood clotting easily  Endocrine:   []  diabetes []  thyroid disease  GI []  vomiting blood []  blood in stool  GU: []  CKD/renal failure []  HD--[]  M/W/F or []  T/T/S []  burning with urination []  blood in urine  Psychiatric: []   anxiety []  depression  Musculoskeletal: []  arthritis []  joint pain  Integumentary: []  rashes []  ulcers  Constitutional: []  fever []  chills   Physical Examination  Filed Vitals:   04/13/15 0958  BP: 112/66  Pulse: 82  Temp: 97.5 F (36.4 C)  Resp: 18   Body mass index is 27.06 kg/(m^2).  General:  WDWN in NAD Gait: Not observed HENT: WNL, normocephalic Pulmonary: normal non-labored breathing, without Rales, rhonchi,  wheezing Cardiac: regular Abdomen: soft, NT/ND, no masses Skin: without rashes, without ulcers  Vascular Exam/Pulses:  Right Left  Radial 2+ (normal) 2+ (normal)  Femoral 2+ (normal) 2+ (normal)  DP 2+ (normal) 2+ (normal)  PT 2+ (normal) 2+ (normal)   Extremities: without ischemic changes, without Gangrene , without cellulitis; without  open wounds;  Musculoskeletal: no muscle wasting or atrophy  Neurologic: A&O X 3; Appropriate Affect ; SENSATION: normal; MOTOR FUNCTION:  moving all extremities equally. Speech is fluent/normal; left facial droop   CBC    Component Value Date/Time   WBC 6.8 04/11/2015 1218   RBC 4.31 04/11/2015 1218   HGB 13.9 04/11/2015 1225   HCT 41.0 04/11/2015 1225   PLT 206 04/11/2015 1218   MCV 88.2 04/11/2015 1218   MCH 30.4 04/11/2015 1218   MCHC 34.5 04/11/2015 1218   RDW 14.0 04/11/2015 1218   LYMPHSABS 1.4 04/11/2015 1218   MONOABS 0.4 04/11/2015 1218   EOSABS 0.2 04/11/2015 1218   BASOSABS 0.0 04/11/2015 1218    BMET    Component Value Date/Time   NA 141 04/12/2015 0623   K 4.2 04/12/2015 0623   CL 108 04/12/2015 0623   CO2 25 04/12/2015 0623   GLUCOSE 114* 04/12/2015 0623   BUN 8 04/12/2015 0623   CREATININE 0.91 04/12/2015 0623   CALCIUM 8.8* 04/12/2015 0623   GFRNONAA >60 04/12/2015 0623   GFRAA >60 04/12/2015 0623    COAGS: Lab Results  Component Value Date   INR 1.05 04/11/2015     Non-Invasive Vascular Imaging:   Carotid Duplex (Doppler)  04/12/15 Preliminary findings: Right = 40-59% ICA stenosis. Left = 1-39% ICA stenosis MRI 04/11/15: IMPRESSION: 1. Subtle acute lacunar infarct in the right internal capsule, posterior limb near the genu. No mass effect or hemorrhage. 2. No associated intracranial MRA finding. There is evidence of left PCA atherosclerosis with mild to moderate stenosis. 3. Other minimal to mild for age chronic small vessel disease.   Statin:  Yes.   Beta Blocker:  No. Aspirin:  Yes.   ACEI:  No. ARB:  No. Other antiplatelets/anticoagulants:  Possibly Plavix- pt now in POINT trial   ASSESSMENT/PLAN: This is a 74 y.o. male with subtle acute lacunar infarct in the right internal capsule, posterior limb near the genu.   -He does have 40-59% stenosis of the right ICA, which is inconsistent with the right sided weakness, but is consistent  with the left facial drooping   -will schedule him for a carotid angiogram tomorrow by Dr. Trula Slade -pre op orders have been completed -npo after MN -continue statin/aspirin   Leontine Locket, PA-C [facility] [phone]   Right brain stroke.  Overall improving but still left facial droop.  Borderline stenosis right carotid to benefit from CEA.  Duplex shows less than 60% CT > 60%.  Benefit if more than 50% in face of stroke however calcified lesions on CT tend to overestimate stenosis.  Will schedule for arch and carotid angio tomorrow with my partner Dr Trula Slade.  Procedure details discussed  with pt and family.  NPO p midnight Consent  Ruta Hinds, MD [facility] [place] Office: [phone] Pager: [phone]

## 2015-04-13 NOTE — Progress Notes (Signed)
PROGRESS NOTE  Trevor Ward:[id number] DOB: [birth year] DOA: 04/11/2015 PCP: Mayra Neer, MD  Assessment/Plan: CVA (cerebral infarction) -telemetry -Appreciate neurology assistance -MRI/MRA brain: . Subtle acute lacunar infarct in the right internal capsule, posterior limb near the genu. No mass effect or hemorrhage. -Echocardiogram ok -Carotid duplex+ CTA 60% stenosis---- carotid angio tomm via vascular -Lipid panel- LDL 130 with goal of < 70 -Hemoglobin A1c 5.9 -Aspirin antiplatelet- POINT STUDY -PT/OT/SLP  HLD -statin   HTN -Currently controlled -Continue preadmission ACE inhibitor   Gout -No signs of acute exacerbation -Continue preadmission allopurinol   Code Status: full Family Communication: patient Disposition Plan:    Consultants:  neuro  Procedures:      HPI/Subjective: Wants to go home but await vascular consult  Objective: Filed Vitals:   04/13/15 0958  BP: 112/66  Pulse: 82  Temp: 97.5 F (36.4 C)  Resp: 18   No intake or output data in the 24 hours ending 04/13/15 1301 Filed Weights   04/11/15 1451  Weight: 83.144 kg (183 lb 4.8 oz)    Exam:   General:  A+Ox3, NAD  Cardiovascular: rrr  Respiratory: clear  Abdomen: +BS, soft  Musculoskeletal: no edema  Data Reviewed: Basic Metabolic Panel:  Recent Labs Lab 04/11/15 1218 04/11/15 1225 04/12/15 0623  NA 137 139 141  K 4.5 4.5 4.2  CL 105 102 108  CO2 25  --  25  GLUCOSE 91 86 114*  BUN 13 15 8   CREATININE 1.15 1.10 0.91  CALCIUM 8.6*  --  8.8*   Liver Function Tests:  Recent Labs Lab 04/11/15 1218  AST 16  ALT 12*  ALKPHOS 60  BILITOT 0.3  PROT 5.9*  ALBUMIN 3.3*   No results for input(s): LIPASE, AMYLASE in the last 168 hours. No results for input(s): AMMONIA in the last 168 hours. CBC:  Recent Labs Lab 04/11/15 1218 04/11/15 1225  WBC 6.8  --   NEUTROABS 4.8  --   HGB 13.1 13.9  HCT 38.0* 41.0  MCV 88.2  --   PLT 206  --     Cardiac Enzymes: No results for input(s): CKTOTAL, CKMB, CKMBINDEX, TROPONINI in the last 168 hours. BNP (last 3 results) No results for input(s): BNP in the last 8760 hours.  ProBNP (last 3 results) No results for input(s): PROBNP in the last 8760 hours.  CBG:  Recent Labs Lab 04/11/15 1216  GLUCAP 80    No results found for this or any previous visit (from the past 240 hour(s)).   Studies: Dg Chest 2 View  04/11/2015   CLINICAL DATA:  74 year old male with hypertension and stroke  EXAM: CHEST  2 VIEW  COMPARISON:  Radiograph dated 01/01/2013  FINDINGS: The heart size and mediastinal contours are within normal limits. Both lungs are clear. The visualized skeletal structures are unremarkable.  IMPRESSION: No active cardiopulmonary disease.   Electronically Signed   By: Anner Crete M.D.   On: 04/11/2015 19:30   Ct Angio Neck W/cm &/or Wo/cm  04/12/2015   CLINICAL DATA:  Right carotid stenosis. Right-sided facial droop. Small right internal capsule lacunar infarct on MRI.  EXAM: CT ANGIOGRAPHY NECK  TECHNIQUE: Multidetector CT imaging of the neck was performed using the standard protocol during bolus administration of intravenous contrast. Multiplanar CT image reconstructions and MIPs were obtained to evaluate the vascular anatomy. Carotid stenosis measurements (when applicable) are obtained utilizing NASCET criteria, using the distal internal carotid diameter as the denominator.  CONTRAST:  31mL OMNIPAQUE IOHEXOL  350 MG/ML SOLN  COMPARISON:  None.  FINDINGS: Aortic arch: 3 vessel aortic arch with moderate calcified plaque. Brachiocephalic artery is patent without stenosis. There is mild narrowing of the proximal to mid right subclavian artery. There is moderate, predominantly noncalcified plaque in the proximal left subclavian artery without significant stenosis.  Right carotid system: Common carotid artery is patent with mild non stenotic plaque in its proximal and mid portions. There  is extensive calcified plaque at the carotid bifurcation and in the proximal ICA resulting in approximately 60% stenosis of the distal common carotid artery and less than 50% narrowing of the proximal ICA. There is mild atherosclerotic irregularity of the more distal cervical ICA without additional stenosis.  Left carotid system: Mild-to-moderate predominantly noncalcified plaque in the proximal and mid common carotid artery results in less than 50% narrowing. Mild calcified plaque in the proximal ICA does not result in significant stenosis.  Vertebral arteries: Vertebral arteries are patent with the left being dominant. There is mild-to-moderate stenosis of both vertebral artery origins.  Skeleton: Multilevel cervical disc degeneration with advanced disc space narrowing from C4-5 to C6-7.  Other neck: Bilateral calcified pleural plaques are partially visualized. Coronary artery calcification is partially seen.  IMPRESSION: 1. Extensive calcified plaque at the right carotid bifurcation with approximately 60% distal common carotid artery stenosis. 2. No significant left common or cervical internal carotid artery stenosis. 3. Mild-to-moderate stenosis of both vertebral artery origins.   Electronically Signed   By: Logan Bores   On: 04/12/2015 16:03   Mr Jodene Nam Head Wo Contrast  04/11/2015   CLINICAL DATA:  74 year old male with acute onset weakness, unable to stand. Facial droop. Initial encounter.  EXAM: MRI HEAD WITHOUT CONTRAST  MRA HEAD WITHOUT CONTRAST  TECHNIQUE: Multiplanar, multiecho pulse sequences of the brain and surrounding structures were obtained without intravenous contrast. Angiographic images of the head were obtained using MRA technique without contrast.  COMPARISON:  Head CT without contrast 1217 hours today, and earlier.  FINDINGS: MRI HEAD FINDINGS  In there is a small 4 mm somewhat vague focus of asymmetric diffusion signal in the posterior limb of the right internal capsule near the genu  (series 5, image 25. Minimal if any associated T2 hyperintensity. No hemorrhage or associated mass effect.  No other restricted diffusion. Major intracranial vascular flow voids are within normal limits.  No midline shift, mass effect, evidence of mass lesion, ventriculomegaly, extra-axial collection or acute intracranial hemorrhage. Cervicomedullary junction and pituitary are within normal limits.  No cortical encephalomalacia. No definite chronic cerebral blood products. Incidental pneumatized posterior clinoid process. Tiny chronic lacunar infarct in the right caudate head. Minimal cerebral white matter T2 and FLAIR hyperintensity, greatest in the right corona radiata.  Visible internal auditory structures appear normal. Visualized paranasal sinuses and mastoids are clear. Orbit and scalp soft tissues are within normal limits. Normal bone marrow signal. Negative visualized cervical spine.  MRA HEAD FINDINGS  Antegrade flow in the posterior circulation with dominant distal left vertebral artery. Normal left PICA origin. Tortuous but otherwise normal vertebrobasilar junction. No basilar artery stenosis. SCA and PCA origins are patent. Normal right PCA. There is mild irregularity and stenosis at the left PCA P1 segment, and possibly also at the P2 segment branching although there is MOTSA artifact also at that level. Bilateral distal PCA flow is within normal limits. Posterior communicating arteries are diminutive or absent.  Antegrade flow in both ICA siphons. Mild siphon dolichoectasia on the right. No siphon stenosis. Ophthalmic artery origins  and carotid termini are normal. Dominant right ACA A1 segment. Anterior communicating artery and visualized bilateral ACA branches are within normal limits. Normal bilateral MCA origins. Visualized left MCA branches are within normal limits. Right MCA M1 segment and visualized right MCA branches are within normal limits.  IMPRESSION: 1. Subtle acute lacunar infarct in the  right internal capsule, posterior limb near the genu. No mass effect or hemorrhage. 2. No associated intracranial MRA finding. There is evidence of left PCA atherosclerosis with mild to moderate stenosis. 3. Other minimal to mild for age chronic small vessel disease.   Electronically Signed   By: Genevie Ann M.D.   On: 04/11/2015 16:53   Mr Brain Wo Contrast  04/11/2015   CLINICAL DATA:  74 year old male with acute onset weakness, unable to stand. Facial droop. Initial encounter.  EXAM: MRI HEAD WITHOUT CONTRAST  MRA HEAD WITHOUT CONTRAST  TECHNIQUE: Multiplanar, multiecho pulse sequences of the brain and surrounding structures were obtained without intravenous contrast. Angiographic images of the head were obtained using MRA technique without contrast.  COMPARISON:  Head CT without contrast 1217 hours today, and earlier.  FINDINGS: MRI HEAD FINDINGS  In there is a small 4 mm somewhat vague focus of asymmetric diffusion signal in the posterior limb of the right internal capsule near the genu (series 5, image 25. Minimal if any associated T2 hyperintensity. No hemorrhage or associated mass effect.  No other restricted diffusion. Major intracranial vascular flow voids are within normal limits.  No midline shift, mass effect, evidence of mass lesion, ventriculomegaly, extra-axial collection or acute intracranial hemorrhage. Cervicomedullary junction and pituitary are within normal limits.  No cortical encephalomalacia. No definite chronic cerebral blood products. Incidental pneumatized posterior clinoid process. Tiny chronic lacunar infarct in the right caudate head. Minimal cerebral white matter T2 and FLAIR hyperintensity, greatest in the right corona radiata.  Visible internal auditory structures appear normal. Visualized paranasal sinuses and mastoids are clear. Orbit and scalp soft tissues are within normal limits. Normal bone marrow signal. Negative visualized cervical spine.  MRA HEAD FINDINGS  Antegrade flow in  the posterior circulation with dominant distal left vertebral artery. Normal left PICA origin. Tortuous but otherwise normal vertebrobasilar junction. No basilar artery stenosis. SCA and PCA origins are patent. Normal right PCA. There is mild irregularity and stenosis at the left PCA P1 segment, and possibly also at the P2 segment branching although there is MOTSA artifact also at that level. Bilateral distal PCA flow is within normal limits. Posterior communicating arteries are diminutive or absent.  Antegrade flow in both ICA siphons. Mild siphon dolichoectasia on the right. No siphon stenosis. Ophthalmic artery origins and carotid termini are normal. Dominant right ACA A1 segment. Anterior communicating artery and visualized bilateral ACA branches are within normal limits. Normal bilateral MCA origins. Visualized left MCA branches are within normal limits. Right MCA M1 segment and visualized right MCA branches are within normal limits.  IMPRESSION: 1. Subtle acute lacunar infarct in the right internal capsule, posterior limb near the genu. No mass effect or hemorrhage. 2. No associated intracranial MRA finding. There is evidence of left PCA atherosclerosis with mild to moderate stenosis. 3. Other minimal to mild for age chronic small vessel disease.   Electronically Signed   By: Genevie Ann M.D.   On: 04/11/2015 16:53    Scheduled Meds: . allopurinol  100 mg Oral BID  . amitriptyline  10 mg Oral QHS  . aspirin  300 mg Rectal Daily   Or  .  aspirin  325 mg Oral Daily  . calcium carbonate  1 tablet Oral TID WC  . lisinopril  10 mg Oral Daily  . pantoprazole  40 mg Oral Daily  . research study medication  75 mg Oral Q breakfast  . simvastatin  40 mg Oral q1800   Continuous Infusions:  Antibiotics Given (last 72 hours)    None      Principal Problem:   CVA (cerebral infarction) Active Problems:   HTN (hypertension)   Gout   Carotid stenosis   Stroke   HLD (hyperlipidemia)    Time spent: 25  min    Trevor Ward  [facility] Pager [phone]. If 7PM-7AM, please contact night-coverage at [email or web address], password [facility] 04/13/2015, 1:01 PM  LOS: 2 days

## 2015-04-13 NOTE — Progress Notes (Signed)
STROKE TEAM PROGRESS NOTE   HISTORY Trevor Ward is a 74 y.o. male with a history of hypertension who was in his normal state of health this morning, and then went to a ballgame ([facility] 04/11/2015 at [facility]). He states that he felt "hot" and sit down. Per family member that is not here, he initially was not able to stand up, and that point that family member called the patient's son who is almost to the ball field. On his arrival, the patient had a facial droop at that point he was having some improvement and was able to stand up and walk. He currently feels back to his normal self other than a persistent left facial droop. Patient was not administered TPA secondary to mild symptoms. He was admitted for further evaluation and treatment.   SUBJECTIVE (INTERVAL HISTORY) Daughter at bedside. Pt relaxing in bed. Pt anxious to go home. Dr. Erlinda Hong has already discussed with Dr. Eliseo Squires regarding vascular surgery consult.   OBJECTIVE Temp:  [97.5 F (36.4 C)-98.6 F (37 C)] 97.5 F (36.4 C) (07/19 0958) Pulse Rate:  [76-82] 82 (07/19 0958) Cardiac Rhythm:  [-] Normal sinus rhythm (07/18 2036) Resp:  [16-18] 18 (07/19 0958) BP: (112-144)/(63-74) 112/66 mmHg (07/19 0958) SpO2:  [97 %-98 %] 97 % (07/19 0958)   Recent Labs Lab 04/11/15 1216  GLUCAP 80    Recent Labs Lab 04/11/15 1218 04/11/15 1225 04/12/15 0623  NA 137 139 141  K 4.5 4.5 4.2  CL 105 102 108  CO2 25  --  25  GLUCOSE 91 86 114*  BUN 13 15 8   CREATININE 1.15 1.10 0.91  CALCIUM 8.6*  --  8.8*    Recent Labs Lab 04/11/15 1218  AST 16  ALT 12*  ALKPHOS 60  BILITOT 0.3  PROT 5.9*  ALBUMIN 3.3*    Recent Labs Lab 04/11/15 1218 04/11/15 1225  WBC 6.8  --   NEUTROABS 4.8  --   HGB 13.1 13.9  HCT 38.0* 41.0  MCV 88.2  --   PLT 206  --    No results for input(s): CKTOTAL, CKMB, CKMBINDEX, TROPONINI in the last 168 hours.  Recent Labs  04/11/15 1218  LABPROT 13.9  INR 1.05    Recent Labs  04/11/15 1711   COLORURINE YELLOW  LABSPEC 1.007  PHURINE 6.5  GLUCOSEU NEGATIVE  HGBUR NEGATIVE  BILIRUBINUR NEGATIVE  KETONESUR NEGATIVE  PROTEINUR NEGATIVE  UROBILINOGEN 0.2  NITRITE NEGATIVE  LEUKOCYTESUR NEGATIVE       Component Value Date/Time   CHOL 181 04/12/2015 0623   TRIG 126 04/12/2015 0623   HDL 26* 04/12/2015 0623   CHOLHDL 7.0 04/12/2015 0623   VLDL 25 04/12/2015 0623   LDLCALC 130* 04/12/2015 0623   Lab Results  Component Value Date   HGBA1C 5.9* 04/12/2015      Component Value Date/Time   LABOPIA NONE DETECTED 04/11/2015 1710   COCAINSCRNUR NONE DETECTED 04/11/2015 1710   LABBENZ NONE DETECTED 04/11/2015 1710   AMPHETMU NONE DETECTED 04/11/2015 1710   THCU NONE DETECTED 04/11/2015 1710   LABBARB NONE DETECTED 04/11/2015 1710     Recent Labs Lab 04/11/15 1218  ETH <5    Dg Chest 2 View 04/11/2015    No active cardiopulmonary disease.      Ct Head Wo Contrast 04/11/2015    No evidence of acute intracranial abnormality.  Atrophy with small vessel ischemic changes.    MRI & MRA Head Wo Contrast 04/11/2015    1.  Subtle acute lacunar infarct in the right internal capsule, posterior limb near the genu. No mass effect or hemorrhage. 2. No associated intracranial MRA finding. There is evidence of left PCA atherosclerosis with mild to moderate stenosis. 3. Other minimal to mild for age chronic small vessel disease.    Carotid Doppler  Right = 40-59% ICA stenosis, based on systolic velocity and 6-96% based on diastolic velocity. Left = 1-39% ICA stenosis.  2D Echocardiogram   - Left ventricle: The cavity size was normal. There was moderateconcentric hypertrophy. Systolic function was normal. Theestimated ejection fraction was in the range of 60% to 65%. Wallmotion was normal; there were no regional wall motionabnormalities. There was an increased relative contribution ofatrial contraction to ventricular filling. Doppler parameters areconsistent with abnormal left  ventricular relaxation (grade 1diastolic dysfunction). - Aortic valve: Moderate diffuse thickening and calcification,consistent with sclerosis. There was mild regurgitation.  CTA neck  04/12/2015     1. Extensive calcified plaque at the right carotid bifurcation with approximately 60% distal common carotid artery stenosis. 2. No significant left common or cervical internal carotid artery stenosis. 3. Mild-to-moderate stenosis of both vertebral artery origins.       PHYSICAL EXAM  Temp:  [97.5 F (36.4 C)-98.6 F (37 C)] 97.5 F (36.4 C) (07/19 0958) Pulse Rate:  [76-82] 82 (07/19 0958) Resp:  [16-18] 18 (07/19 0958) BP: (112-144)/(63-74) 112/66 mmHg (07/19 0958) SpO2:  [97 %-98 %] 97 % (07/19 0958)  General - Well nourished, well developed, in no apparent distress.  Ophthalmologic - Fundi not visualized due to eye movement.  Cardiovascular - Regular rate and rhythm with no murmur.  Mental Status -  Level of arousal and orientation to time, place, and person were intact. Language including expression, naming, repetition, comprehension was assessed and found intact. Fund of Knowledge was assessed and was impaired.  Cranial Nerves II - XII - II - Visual field intact OU. III, IV, VI - Extraocular movements intact. V - Facial sensation intact bilaterally. VII - right nasolabial fold flattening. VIII - Hearing & vestibular intact bilaterally. X - Palate elevates symmetrically. XI - Chin turning & shoulder shrug intact bilaterally. XII - Tongue protrusion intact.  Motor Strength - [name] strength was normal in all extremities and pronator drift was absent.  Bulk was normal and fasciculations were absent.   Motor Tone - Muscle tone was assessed at the neck and appendages and was normal.  Reflexes - [name] reflexes were 1+ in all extremities and he had no pathological reflexes.  Sensory - Light touch, temperature/pinprick were assessed and were symmetrical.     Coordination - The patient had normal movements in the hands and feet with no ataxia or dysmetria.  Tremor was absent.  Gait and Station - deferred due to safety concerns.  ASSESSMENT/PLAN Mr. Trevor Ward is a 74 y.o. male with history of hypertension presenting with Left-sided weakness. He did not receive IV t-PA due to mild symptoms .   Stroke:  Non-dominant right IC/PLIC infarct secondary to small vessel disease source  Resultant  Mild left facial weakness  MRI   right IC/PLIC infarct   MRA  Mild L PCA stenosis  Carotid Doppler  R - 40-59% ICA stenosis, based on systolic velocity and 7-89% based on diastolic velocity.    CTA neck R distal CCA 60% stenosis  2D Echo  No source of embolus   LDL 130  HgbA1c 5.9  SCDs for VTE prophylaxis Diet Heart Room service appropriate?: Yes;  Fluid consistency:: Thin  no antithrombotic prior to admission, now on aspirin 325 mg orally every day and and POINT study drug. Patient is enrollled in [place] is a randomized, double-blind, multicenter clinical trial to determine whether clopidogrel 75mg /day (after a loading dose of 600mg ) is effective in improving survival free from major ischemic vascular events (ischemic stroke, myocardial infarction, and ischemic vascular death) at 90 days when initiated within 12 hours time last known free of new ischemic symptoms of TIA or minor ischemic stroke in subjects receiving aspirin 50-325mg /day. Please contact [facility] at [phone] for any questions. Continue drug at discharge; order has been written to dispense.   Patient counseled to be compliant with his antithrombotic medications  Ongoing aggressive stroke risk factor management  VVS consult called to eval R CCA calcification, will have cerebral angio in am  Therapy recommendations:  No PT  Disposition:  Return home  Follow up Dr. Leonie Man 2 mos, stroke clinic. Order written.  Carotid  stenosis  Right distal CCA 60% stenosis with heavy calcification  Vascular surgery consulted  Will do cerebral angio in am  Hypertension  Stable Permissive hypertension (OK if <220/120) for 24-48 hours post stroke and then gradually normalized within 5-7 days. On lisinopril  Hyperlipidemia  Home meds:  No statin  LDL 130, goal < 70  On Zocor 40 mg daily now  Continue statin at discharge  Other Stroke Risk Factors  Advanced age  Family hx stroke (mother)  Hospital day # [place] for Pager information 04/13/2015 10:59 AM   I, the attending vascular neurologist, have personally obtained a history, examined the patient, evaluated laboratory data, individually viewed imaging studies and agree with radiology interpretations. I also discussed with pt, his daughter and Dr. Eliseo Squires regarding his care plan. Together with the NP/PA, we formulated the assessment and plan of care which reflects our mutual decision.  I have made any additions or clarifications directly to the above note and agree with the findings and plan as currently documented.   74 yo M with hx of HTN admitted for right IC infarct consistent with small vessel disease. However, CUS showed right ICA 40-59% stenosis. CTA neck showed right distal CCA 60% stenosis. Other stroke w/u negative including MRA and TTE. LDL 130, put on zocor. A1C 5.9. Continue ASA ans statin. Pt enrolled in POINT trial also. Vascular surgery consulted for right CCA stenosis in the setting of right IC infarct. Cerebral angio scheduled tomorrow.  Rosalin Hawking, MD PhD Stroke Neurology 04/13/2015 2:04 PM   To contact Stroke Continuity provider, please refer to [email or web address] After hours, contact General Neurology

## 2015-04-14 ENCOUNTER — Encounter (HOSPITAL_COMMUNITY)
Admission: EM | Disposition: A | Discharge status: Home / Self Care | Payer: Self-pay | Source: Home / Self Care | Attending: Internal Medicine

## 2015-04-14 DIAGNOSIS — I63231 Cerebral infarction due to unspecified occlusion or stenosis of right carotid arteries: Secondary | ICD-10-CM

## 2015-04-14 HISTORY — PX: PERIPHERAL VASCULAR CATHETERIZATION: SHX172C

## 2015-04-14 LAB — CBC
HCT: 43.2 % (ref 39.0–52.0)
Hemoglobin: 15 g/dL (ref 13.0–17.0)
MCH: 30.7 pg (ref 26.0–34.0)
MCHC: 34.7 g/dL (ref 30.0–36.0)
MCV: 88.5 fL (ref 78.0–100.0)
Platelets: 217 10*3/uL (ref 150–400)
RBC: 4.88 MIL/uL (ref 4.22–5.81)
RDW: 14 % (ref 11.5–15.5)
WBC: 9 10*3/uL (ref 4.0–10.5)

## 2015-04-14 LAB — BASIC METABOLIC PANEL
ANION GAP: 10 (ref 5–15)
BUN: 11 mg/dL (ref 6–20)
CALCIUM: 9.2 mg/dL (ref 8.9–10.3)
CO2: 29 mmol/L (ref 22–32)
CREATININE: 0.97 mg/dL (ref 0.61–1.24)
Chloride: 100 mmol/L — ABNORMAL LOW (ref 101–111)
GFR calc Af Amer: >60 mL/min (ref >60)
GFR calc non Af Amer: >60 mL/min (ref >60)
Glucose, Bld: 126 mg/dL — ABNORMAL HIGH (ref 65–99)
POTASSIUM: 4.1 mmol/L (ref 3.5–5.1)
Sodium: 139 mmol/L (ref 135–145)

## 2015-04-14 SURGERY — CAROTID ANGIOGRAPHY
Anesthesia: LOCAL | Laterality: Bilateral

## 2015-04-14 MED ORDER — ONDANSETRON HCL 4 MG/2ML IJ SOLN: 4.0000 mg | Freq: Four times a day (QID) | INTRAMUSCULAR | Status: DC | PRN

## 2015-04-14 MED ORDER — SIMVASTATIN 40 MG PO TABS: 40.0000 mg | ORAL_TABLET | Freq: Every day | ORAL | Status: DC

## 2015-04-14 MED ORDER — STUDY - INVESTIGATIONAL DRUG SIMPLE RECORD: 75.0000 mg | Freq: Every day | Status: DC

## 2015-04-14 MED ORDER — ACETAMINOPHEN 325 MG RE SUPP
325.0000 mg | RECTAL | Status: DC | PRN
  Filled 2015-04-14: qty 2

## 2015-04-14 MED ORDER — ACETAMINOPHEN 325 MG PO TABS: 325.0000 mg | ORAL_TABLET | ORAL | Status: DC | PRN

## 2015-04-14 MED ORDER — PHENOL 1.4 % MT LIQD
1.0000 | OROMUCOSAL | Status: DC | PRN
  Filled 2015-04-14: qty 177

## 2015-04-14 MED ORDER — METOPROLOL TARTRATE 1 MG/ML IV SOLN: 2.0000 mg | INTRAVENOUS | Status: DC | PRN

## 2015-04-14 MED ORDER — SODIUM CHLORIDE 0.9 % IV SOLN: INTRAVENOUS | Status: DC

## 2015-04-14 MED ORDER — LIDOCAINE HCL (PF) 1 % IJ SOLN
INTRAMUSCULAR | Status: AC
  Filled 2015-04-14: qty 30

## 2015-04-14 MED ORDER — GUAIFENESIN-DM 100-10 MG/5ML PO SYRP: 15.0000 mL | ORAL_SOLUTION | ORAL | Status: DC | PRN

## 2015-04-14 MED ORDER — LIDOCAINE HCL (PF) 1 % IJ SOLN
INTRAMUSCULAR | Status: DC | PRN
  Administered 2015-04-14: 15 mL

## 2015-04-14 MED ORDER — DOCUSATE SODIUM 100 MG PO CAPS: 100.0000 mg | ORAL_CAPSULE | Freq: Every day | ORAL | Status: DC

## 2015-04-14 MED ORDER — ASPIRIN 325 MG PO TABS
325.0000 mg | ORAL_TABLET | Freq: Every day | ORAL | Status: DC
Start: 2015-04-14 — End: 2015-12-09

## 2015-04-14 MED ORDER — ALUM & MAG HYDROXIDE-SIMETH 200-200-20 MG/5ML PO SUSP: 15.0000 mL | ORAL | Status: DC | PRN

## 2015-04-14 MED ORDER — HEPARIN (PORCINE) IN NACL 2-0.9 UNIT/ML-% IJ SOLN
INTRAMUSCULAR | Status: AC
  Filled 2015-04-14: qty 1000

## 2015-04-14 MED ORDER — HYDRALAZINE HCL 20 MG/ML IJ SOLN: 5.0000 mg | INTRAMUSCULAR | Status: DC | PRN

## 2015-04-14 MED ORDER — LABETALOL HCL 5 MG/ML IV SOLN: 10.0000 mg | INTRAVENOUS | Status: DC | PRN

## 2015-04-14 SURGICAL SUPPLY — 12 items
CATH ANGIO 5F BER2 100CM (CATHETERS) ×2 IMPLANT
CATH ANGIO 5F PIGTAIL 100CM (CATHETERS) ×2 IMPLANT
COVER PRB 48X5XTLSCP FOLD TPE (BAG) ×1 IMPLANT
COVER PROBE 5X48 (BAG) ×1
DRAPE ZERO GRAVITY STERILE (DRAPES) ×2 IMPLANT
KIT MICROINTRODUCER STIFF 5F (SHEATH) ×2 IMPLANT
KIT PV (KITS) ×2 IMPLANT
SHEATH PINNACLE 5F 10CM (SHEATH) ×2 IMPLANT
SYR MEDRAD MARK V 150ML (SYRINGE) ×2 IMPLANT
TRANSDUCER W/STOPCOCK (MISCELLANEOUS) ×2 IMPLANT
TRAY PV CATH (CUSTOM PROCEDURE TRAY) ×2 IMPLANT
WIRE BENTSON .035X145CM (WIRE) ×2 IMPLANT

## 2015-04-14 NOTE — Progress Notes (Signed)
Pt and wife given all discharge instructions and info about f/u appts.  Education sheets given r/t stroke, carotid arterigram with after care of site  and carotid endarterectomy. No changes in neuro status since admitted here at 1415. Discharged home with wife and all belongings in nad.

## 2015-04-14 NOTE — H&P (View-Only) (Signed)
Hospital Consult    Reason for Consult:  Right carotid artery stenosis - symptomatic Referring Physician:  Dr. Eliseo Squires MRN #:  [id number]  History of Present Illness: This is a 74 y.o. male who states he was at a ball game when he starting having right sided weakness and left sided facial droop.  He was having difficulty walking as he was not able to put any weight on the right side of his body.  Pt states that he was feeling hot and felt like he needed rest.  He was brought to the ER and was found to have a subtle acute lacunar infarct in the right internal capsule, posterior limb near the genu.  There was no mass effect or hemorrhage.   A carotid duplex was obtained and revealed 40-59% stenosis of the right ICA and 1-39% stenosis of the left ICA.  He does take an ACEI for hypertension.  He was not on a statin at home, but has been placed on one here.    VVS is consulted.   Past Medical History  Diagnosis Date  . Hypertension     Past surgical hx: Hx of knee surgery   No Known Allergies  Prior to Admission medications   Medication Sig Start Date End Date Taking? Authorizing Provider  allopurinol (ZYLOPRIM) 100 MG tablet Take 100 mg by mouth 2 (two) times daily. 01/26/15  Yes Historical Provider, MD  amitriptyline (ELAVIL) 10 MG tablet Take 10 mg by mouth at bedtime.   Yes Historical Provider, MD  esomeprazole (NEXIUM) 40 MG capsule Take 40 mg by mouth daily at 12 noon.   Yes Historical Provider, MD  ibuprofen (ADVIL,MOTRIN) 200 MG tablet Take 400 mg by mouth 2 (two) times daily.    Yes Historical Provider, MD  lisinopril (PRINIVIL,ZESTRIL) 10 MG tablet Take 10 mg by mouth daily. 04/01/15  Yes Historical Provider, MD    History   Social History  . Marital Status: Married    Spouse Name: N/A  . Number of Children: N/A  . Years of Education: N/A   Occupational History  . Not on file.   Social History Main Topics  . Smoking status: Former Research scientist (life sciences)  . Smokeless tobacco: Not on  file  . Alcohol Use: No  . Drug Use: No  . Sexual Activity: Not on file   Other Topics Concern  . Not on file   Social History Narrative    Family Hx: Several brothers with hx of stroke Mother with hx of stroke  ROS: [x]  Positive   [ ]  Negative   [ ]  All sytems reviewed and are negative  Cardiovascular: []  chest pain/pressure []  palpitations []  SOB lying flat []  DOE []  pain in legs while walking []  pain in legs at rest []  pain in legs at night []  non-healing ulcers []  hx of DVT []  swelling in legs  Pulmonary: []  productive cough []  asthma/wheezing []  home O2  Neurologic: [x]  weakness in [x]  right arm [x]  right leg [x]  left facial droop []  numbness in []  arms []  legs [x]  hx of CVA []  mini stroke [] difficulty speaking or slurred speech []  temporary loss of vision in one eye []  dizziness  Hematologic: []  hx of cancer []  bleeding problems []  problems with blood clotting easily  Endocrine:   []  diabetes []  thyroid disease  GI []  vomiting blood []  blood in stool  GU: []  CKD/renal failure []  HD--[]  M/W/F or []  T/T/S []  burning with urination []  blood in urine  Psychiatric: []   anxiety []  depression  Musculoskeletal: []  arthritis []  joint pain  Integumentary: []  rashes []  ulcers  Constitutional: []  fever []  chills   Physical Examination  Filed Vitals:   04/13/15 0958  BP: 112/66  Pulse: 82  Temp: 97.5 F (36.4 C)  Resp: 18   Body mass index is 27.06 kg/(m^2).  General:  WDWN in NAD Gait: Not observed HENT: WNL, normocephalic Pulmonary: normal non-labored breathing, without Rales, rhonchi,  wheezing Cardiac: regular Abdomen: soft, NT/ND, no masses Skin: without rashes, without ulcers  Vascular Exam/Pulses:  Right Left  Radial 2+ (normal) 2+ (normal)  Femoral 2+ (normal) 2+ (normal)  DP 2+ (normal) 2+ (normal)  PT 2+ (normal) 2+ (normal)   Extremities: without ischemic changes, without Gangrene , without cellulitis; without  open wounds;  Musculoskeletal: no muscle wasting or atrophy  Neurologic: A&O X 3; Appropriate Affect ; SENSATION: normal; MOTOR FUNCTION:  moving all extremities equally. Speech is fluent/normal; left facial droop   CBC    Component Value Date/Time   WBC 6.8 04/11/2015 1218   RBC 4.31 04/11/2015 1218   HGB 13.9 04/11/2015 1225   HCT 41.0 04/11/2015 1225   PLT 206 04/11/2015 1218   MCV 88.2 04/11/2015 1218   MCH 30.4 04/11/2015 1218   MCHC 34.5 04/11/2015 1218   RDW 14.0 04/11/2015 1218   LYMPHSABS 1.4 04/11/2015 1218   MONOABS 0.4 04/11/2015 1218   EOSABS 0.2 04/11/2015 1218   BASOSABS 0.0 04/11/2015 1218    BMET    Component Value Date/Time   NA 141 04/12/2015 0623   K 4.2 04/12/2015 0623   CL 108 04/12/2015 0623   CO2 25 04/12/2015 0623   GLUCOSE 114* 04/12/2015 0623   BUN 8 04/12/2015 0623   CREATININE 0.91 04/12/2015 0623   CALCIUM 8.8* 04/12/2015 0623   GFRNONAA >60 04/12/2015 0623   GFRAA >60 04/12/2015 0623    COAGS: Lab Results  Component Value Date   INR 1.05 04/11/2015     Non-Invasive Vascular Imaging:   Carotid Duplex (Doppler)  04/12/15 Preliminary findings: Right = 40-59% ICA stenosis. Left = 1-39% ICA stenosis MRI 04/11/15: IMPRESSION: 1. Subtle acute lacunar infarct in the right internal capsule, posterior limb near the genu. No mass effect or hemorrhage. 2. No associated intracranial MRA finding. There is evidence of left PCA atherosclerosis with mild to moderate stenosis. 3. Other minimal to mild for age chronic small vessel disease.   Statin:  Yes.   Beta Blocker:  No. Aspirin:  Yes.   ACEI:  No. ARB:  No. Other antiplatelets/anticoagulants:  Possibly Plavix- pt now in POINT trial   ASSESSMENT/PLAN: This is a 74 y.o. male with subtle acute lacunar infarct in the right internal capsule, posterior limb near the genu.   -He does have 40-59% stenosis of the right ICA, which is inconsistent with the right sided weakness, but is consistent  with the left facial drooping   -will schedule him for a carotid angiogram tomorrow by Dr. Trula Slade -pre op orders have been completed -npo after MN -continue statin/aspirin   Leontine Locket, PA-C [facility] [phone]   Right brain stroke.  Overall improving but still left facial droop.  Borderline stenosis right carotid to benefit from CEA.  Duplex shows less than 60% CT > 60%.  Benefit if more than 50% in face of stroke however calcified lesions on CT tend to overestimate stenosis.  Will schedule for arch and carotid angio tomorrow with my partner Dr Trula Slade.  Procedure details discussed  with pt and family.  NPO p midnight Consent  Ruta Hinds, MD [facility] [place] Office: [phone] Pager: [phone]

## 2015-04-14 NOTE — Interval H&P Note (Signed)
History and Physical Interval Note:  04/14/2015 10:22 AM  Trevor Ward  has presented today for surgery, with the diagnosis of rt. carotid stenosis  The various methods of treatment have been discussed with the patient and family. After consideration of risks, benefits and other options for treatment, the patient has consented to  Procedure(s): Carotid Angiography (Bilateral) as a surgical intervention .  [name] history has been reviewed, patient examined, no change in status, stable for surgery.  I have reviewed the patient's chart and labs.  Questions were answered to the patient's satisfaction.     Annamarie Major  Discuss proceeding with diagnostic angiogram

## 2015-04-14 NOTE — Consult Note (Signed)
[facility] [place]  Subjective  - no change   Objective 129/72 72 98.6 F (37 C) (Oral) 17 99% No intake or output data in the 24 hours ending 04/14/15 1253  Neuro status unchanged still with mild facial droop  Assessment/Planning: Angiogram reviewed.  Right carotid stenosis > 50% and potential embolic source. Stop Plavix today Right CEA next Wednesday.  Ok to d/c from my standpoint Ok to continue aspirin  Ruta Hinds 04/14/2015 12:53 PM --  Laboratory Lab Results:  Recent Labs  04/14/15 0545  WBC 9.0  HGB 15.0  HCT 43.2  PLT 217   BMET  Recent Labs  04/12/15 0623 04/14/15 0545  NA 141 139  K 4.2 4.1  CL 108 100*  CO2 25 29  GLUCOSE 114* 126*  BUN 8 11  CREATININE 0.91 0.97  CALCIUM 8.8* 9.2    COAG Lab Results  Component Value Date   INR 1.05 04/11/2015   No results found for: PTT

## 2015-04-14 NOTE — Progress Notes (Signed)
STROKE TEAM PROGRESS NOTE   HISTORY Trevor Ward is a 74 y.o. male with a history of hypertension who was in his normal state of health this morning, and then went to a ballgame ([facility] 04/11/2015 at [place]). He states that he felt "hot" and sit down. Per family member that is not here, he initially was not able to stand up, and that point that family member called the patient's son who is almost to the ball field. On his arrival, the patient had a facial droop at that point he was having some improvement and was able to stand up and walk. He currently feels back to his normal self other than a persistent left facial droop. Patient was not administered TPA secondary to mild symptoms. He was admitted for further evaluation and treatment.   SUBJECTIVE (INTERVAL HISTORY) Family members are at bedside. They are waiting for angio today. Pt is eager to go home.   OBJECTIVE Temp:  [97.5 F (36.4 C)-98.7 F (37.1 C)] 97.8 F (36.6 C) (07/20 0556) Pulse Rate:  [78-87] 78 (07/20 0556) Cardiac Rhythm:  [-]  Resp:  [18-20] 18 (07/20 0556) BP: (112-145)/(63-72) 123/72 mmHg (07/20 0556) SpO2:  [97 %-100 %] 98 % (07/20 0556)   Recent Labs Lab 04/11/15 1216  GLUCAP 80    Recent Labs Lab 04/11/15 1218 04/11/15 1225 04/12/15 0623 04/14/15 0545  NA 137 139 141 139  K 4.5 4.5 4.2 4.1  CL 105 102 108 100*  CO2 25  --  25 29  GLUCOSE 91 86 114* 126*  BUN 13 15 8 11   CREATININE 1.15 1.10 0.91 0.97  CALCIUM 8.6*  --  8.8* 9.2    Recent Labs Lab 04/11/15 1218  AST 16  ALT 12*  ALKPHOS 60  BILITOT 0.3  PROT 5.9*  ALBUMIN 3.3*    Recent Labs Lab 04/11/15 1218 04/11/15 1225 04/14/15 0545  WBC 6.8  --  9.0  NEUTROABS 4.8  --   --   HGB 13.1 13.9 15.0  HCT 38.0* 41.0 43.2  MCV 88.2  --  88.5  PLT 206  --  217   No results for input(s): CKTOTAL, CKMB, CKMBINDEX, TROPONINI in the last 168 hours.  Recent Labs  04/11/15 1218  LABPROT 13.9  INR 1.05    Recent Labs   04/11/15 1711  COLORURINE YELLOW  LABSPEC 1.007  PHURINE 6.5  GLUCOSEU NEGATIVE  HGBUR NEGATIVE  BILIRUBINUR NEGATIVE  KETONESUR NEGATIVE  PROTEINUR NEGATIVE  UROBILINOGEN 0.2  NITRITE NEGATIVE  LEUKOCYTESUR NEGATIVE       Component Value Date/Time   CHOL 181 04/12/2015 0623   TRIG 126 04/12/2015 0623   HDL 26* 04/12/2015 0623   CHOLHDL 7.0 04/12/2015 0623   VLDL 25 04/12/2015 0623   LDLCALC 130* 04/12/2015 0623   Lab Results  Component Value Date   HGBA1C 5.9* 04/12/2015      Component Value Date/Time   LABOPIA NONE DETECTED 04/11/2015 1710   COCAINSCRNUR NONE DETECTED 04/11/2015 1710   LABBENZ NONE DETECTED 04/11/2015 1710   AMPHETMU NONE DETECTED 04/11/2015 1710   THCU NONE DETECTED 04/11/2015 1710   LABBARB NONE DETECTED 04/11/2015 1710     Recent Labs Lab 04/11/15 1218  ETH <5    Dg Chest 2 View 04/11/2015    No active cardiopulmonary disease.      Ct Head Wo Contrast 04/11/2015    No evidence of acute intracranial abnormality.  Atrophy with small vessel ischemic changes.    MRI &  MRA Head Wo Contrast 04/11/2015    1. Subtle acute lacunar infarct in the right internal capsule, posterior limb near the genu. No mass effect or hemorrhage. 2. No associated intracranial MRA finding. There is evidence of left PCA atherosclerosis with mild to moderate stenosis. 3. Other minimal to mild for age chronic small vessel disease.    Carotid Doppler  Right = 40-59% ICA stenosis, based on systolic velocity and 3-81% based on diastolic velocity. Left = 1-39% ICA stenosis.  2D Echocardiogram   - Left ventricle: The cavity size was normal. There was moderateconcentric hypertrophy. Systolic function was normal. Theestimated ejection fraction was in the range of 60% to 65%. Wallmotion was normal; there were no regional wall motionabnormalities. There was an increased relative contribution ofatrial contraction to ventricular filling. Doppler parameters areconsistent with  abnormal left ventricular relaxation (grade 1diastolic dysfunction). - Aortic valve: Moderate diffuse thickening and calcification,consistent with sclerosis. There was mild regurgitation.  CTA neck  04/12/2015     1. Extensive calcified plaque at the right carotid bifurcation with approximately 60% distal common carotid artery stenosis. 2. No significant left common or cervical internal carotid artery stenosis. 3. Mild-to-moderate stenosis of both vertebral artery origins.      Carotid Angiogram 04/14/2015 #1 irregular greater than 50% right distal common and bifurcation carotid stenosis #2 based on the appearance of the plaque morphology, this is a potential source for his stroke #3 type I aortic arch  PHYSICAL EXAM  Temp:  [97.5 F (36.4 C)-98.7 F (37.1 C)] 97.8 F (36.6 C) (07/20 0556) Pulse Rate:  [78-87] 78 (07/20 0556) Resp:  [18-20] 18 (07/20 0556) BP: (112-145)/(63-72) 123/72 mmHg (07/20 0556) SpO2:  [97 %-100 %] 98 % (07/20 0556)  General - Well nourished, well developed, in no apparent distress.  Ophthalmologic - Fundi not visualized due to eye movement.  Cardiovascular - Regular rate and rhythm with no murmur.  Mental Status -  Level of arousal and orientation to time, place, and person were intact. Language including expression, naming, repetition, comprehension was assessed and found intact.  Cranial Nerves II - XII - II - Visual field intact OU. III, IV, VI - Extraocular movements intact. V - Facial sensation intact bilaterally. VII - right nasolabial fold mild flattening. VIII - Hearing & vestibular intact bilaterally. X - Palate elevates symmetrically. XI - Chin turning & shoulder shrug intact bilaterally. XII - Tongue protrusion intact.  Motor Strength - [name] strength was normal in all extremities and pronator drift was absent.  Bulk was normal and fasciculations were absent.   Motor Tone - Muscle tone was assessed at the neck and appendages  and was normal.  Reflexes - [name] reflexes were 1+ in all extremities and he had no pathological reflexes.  Sensory - Light touch, temperature/pinprick were assessed and were symmetrical.    Coordination - The patient had normal movements in the hands and feet with no ataxia or dysmetria.  Tremor was absent.  Gait and Station - deferred due to safety concerns.  ASSESSMENT/PLAN Trevor Ward is a 74 y.o. male with history of hypertension presenting with Left-sided weakness. He did not receive IV t-PA due to mild symptoms .   Stroke:  Non-dominant right IC/PLIC infarct likely secondary to right distal CCA stenosis  Resultant  Mild left facial weakness  MRI   right IC/PLIC infarct   MRA  Mild L PCA stenosis  Carotid Doppler  R - 40-59% ICA stenosis, based on systolic velocity and 8-29%  based on diastolic velocity.    CTA neck R distal CCA 60% stenosis  Carotid Angio showed >50% right distal CCA stenosis  2D Echo  No source of embolus   LDL 130  HgbA1c 5.9  SCDs for VTE prophylaxis Diet NPO time specified Except for: Sips with Meds  no antithrombotic prior to admission, now on aspirin 325 mg orally every day and and POINT study drug. Patient is enrollled in [place] is a randomized, double-blind, multicenter clinical trial to determine whether clopidogrel 75mg /day (after a loading dose of 600mg ) is effective in improving survival free from major ischemic vascular events (ischemic stroke, myocardial infarction, and ischemic vascular death) at 90 days when initiated within 12 hours time last known free of new ischemic symptoms of TIA or minor ischemic stroke in subjects receiving aspirin 50-325mg /day. Please contact [facility] at [phone] for any questions. Continue drug at discharge; order has been written to dispense.   Patient counseled to be compliant with his antithrombotic medications  Ongoing aggressive stroke risk  factor management  Therapy recommendations:  No PT  Disposition:  Return home  Follow up Dr. Leonie Man 2 mos, stroke clinic. Order written.  Carotid stenosis  Right distal CCA 60% stenosis with heavy calcification  Vascular surgery consulted, will do CEA next Wednesday  Carotid Angio showed >50% right CCA stenosis  Due to CEA, investigational drug will be discontinued. We recommend to stop 5 days prior to CEA, and resume after the procedure.  Hypertension  Stable BP goal normotensive now. On lisinopril  Hyperlipidemia  Home meds:  No statin  LDL 130, goal < 70  On Zocor 40 mg daily now  Continue statin at discharge  Other Stroke Risk Factors  Advanced age  Family hx stroke (mother)  Hospital day # 3  Neurology will sign off. Please call with questions. Pt will follow up with Dr. Leonie Man at [facility] in about 2 months. Thanks for the consult.  Trevor Hawking, MD PhD Stroke Neurology 04/14/2015 4:14 PM   To contact Stroke Continuity provider, please refer to [email or web address] After hours, contact General Neurology

## 2015-04-14 NOTE — Discharge Summary (Signed)
Physician Discharge Summary  Trevor Ward EXH:[id number] DOB: Jan 17, 1941 DOA: 04/11/2015  PCP: Mayra Neer, MD  Admit date: 04/11/2015 Discharge date: 04/14/2015  Time spent: 35 minutes  Recommendations for Outpatient Follow-up:  1. CEA surgery planned for Wed- hold POINT trial med until after surgery  Discharge Diagnoses:  Principal Problem:   CVA (cerebral infarction) Active Problems:   HTN (hypertension)   Gout   Carotid stenosis   Stroke   HLD (hyperlipidemia)   Discharge Condition: improved  Diet recommendation: cardiac  Filed Weights   04/11/15 1451  Weight: 83.144 kg (183 lb 4.8 oz)    History of present illness:  This is a 74 yo male patient with past medical history of hypertension and gout who presented to the ER after his family noticed he was having gait disturbance with apparent right side weakness and left sided facial drooping. Patient denied any awareness of any of those symptoms. At 1045 this morning the patient's son noticed that the patient had left-sided facial drooping and left eye drooping and was having difficulty walking. He appeared to be unable to put any weight on the right side of his body and the right side of his body appeared to be flaccid according to the family. He had what was described as a staggering gait and he kept his eyes closed during the episode. When I questioned the patient he did not report any dizziness or nausea with these symptoms. He reports that he "felt hot" several times over the last few weeks and clarified this as feeling hot after being outside in the heat for greater than 1 hour per episode. He has not had any chest pain or shortness of breath with these symptoms. No recent illnesses or medication changes. He denied any tingling or numbness in the face or the extremities.  In the ER patient was afebrile, BP was 134/72 pulse 76 and regular room air saturations 97%. Stat CT of the head without contrast revealed atrophy with  small vessel ischemic changes but no evidence of acute intracranial abnormality. Labs were unremarkable. Since arrival to the ER although the patient had persistent left facial drooping he improved to the point he was able to stand up and walk. Patient told the neurologist and myself he felt like he was back to normal other than the left facial drooping. He has been evaluated by neurology. Since symptoms were not severe and actually improving he was not candidate for TPA.  Hospital Course:  CVA (cerebral infarction) -telemetry -Appreciate neurology assistance -MRI/MRA brain: . Subtle acute lacunar infarct in the right internal capsule, posterior limb near the genu. No mass effect or hemorrhage. -Echocardiogram ok -Carotid duplex+ CTA 60% stenosis---- s/p carotid angio: Right carotid stenosis > 50% and potential embolic source-- CEA planned for Wednesday -Lipid panel- LDL 130 with goal of < 70- statin added -Hemoglobin A1c 5.9 -Aspirin antiplatelet- POINT STUDY -PT/OT/SLP  HLD -statin   HTN -Currently controlled -Continue preadmission ACE inhibitor   Gout -No signs of acute exacerbation -Continue preadmission allopurinol  Procedures:  angiogram  Consultations:  Vascular  neuro  Discharge Exam: Filed Vitals:   04/14/15 1255  BP: 130/74  Pulse: 70  Temp:   Resp: 20    General: A+Ox3, NAD-anxious to go home   Discharge Instructions   Discharge Instructions    Diet - low sodium heart healthy    Complete by:  As directed      Discharge instructions    Complete by:  As directed  Hold research study medication until after CEA on Wednesday Dr. Oneida Alar plans CEA on Wednesday     Increase activity slowly    Complete by:  As directed           Current Discharge Medication List    START taking these medications   Details  aspirin 325 MG tablet Take 1 tablet (325 mg total) by mouth daily.    research study medication Take 75 mg by mouth daily with breakfast.     simvastatin (ZOCOR) 40 MG tablet Take 1 tablet (40 mg total) by mouth daily at 6 PM. Qty: 30 tablet, Refills: 0      CONTINUE these medications which have NOT CHANGED   Details  allopurinol (ZYLOPRIM) 100 MG tablet Take 100 mg by mouth 2 (two) times daily.    amitriptyline (ELAVIL) 10 MG tablet Take 10 mg by mouth at bedtime.    esomeprazole (NEXIUM) 40 MG capsule Take 40 mg by mouth daily at 12 noon.    lisinopril (PRINIVIL,ZESTRIL) 10 MG tablet Take 10 mg by mouth daily.      STOP taking these medications     ibuprofen (ADVIL,MOTRIN) 200 MG tablet        No Known Allergies    The results of significant diagnostics from this hospitalization (including imaging, microbiology, ancillary and laboratory) are listed below for reference.    Significant Diagnostic Studies: Dg Chest 2 View  04/11/2015   CLINICAL DATA:  74 year old male with hypertension and stroke  EXAM: CHEST  2 VIEW  COMPARISON:  Radiograph dated 01/01/2013  FINDINGS: The heart size and mediastinal contours are within normal limits. Both lungs are clear. The visualized skeletal structures are unremarkable.  IMPRESSION: No active cardiopulmonary disease.   Electronically Signed   By: Anner Crete M.D.   On: 04/11/2015 19:30   Ct Head Wo Contrast  04/11/2015   CLINICAL DATA:  Code stroke, right facial droop  EXAM: CT HEAD WITHOUT CONTRAST  TECHNIQUE: Contiguous axial images were obtained from the base of the skull through the vertex without intravenous contrast.  COMPARISON:  None.  FINDINGS: No evidence of parenchymal hemorrhage or extra-axial fluid collection. No mass lesion, mass effect, or midline shift.  No CT evidence of acute infarction.  Subcortical white matter and periventricular small vessel ischemic changes.  Mild cortical atrophy.  The visualized paranasal sinuses are essentially clear. The mastoid air cells are unopacified.  No evidence of calvarial fracture.  IMPRESSION: No evidence of acute intracranial  abnormality.  Atrophy with small vessel ischemic changes.  These results were called by telephone at the time of interpretation on 04/11/2015 at 12:21 pm to Dr. Leonard Schwartz , who verbally acknowledged these results.   Electronically Signed   By: Julian Hy M.D.   On: 04/11/2015 12:21   Ct Angio Neck W/cm &/or Wo/cm  04/12/2015   CLINICAL DATA:  Right carotid stenosis. Right-sided facial droop. Small right internal capsule lacunar infarct on MRI.  EXAM: CT ANGIOGRAPHY NECK  TECHNIQUE: Multidetector CT imaging of the neck was performed using the standard protocol during bolus administration of intravenous contrast. Multiplanar CT image reconstructions and MIPs were obtained to evaluate the vascular anatomy. Carotid stenosis measurements (when applicable) are obtained utilizing NASCET criteria, using the distal internal carotid diameter as the denominator.  CONTRAST:  15mL OMNIPAQUE IOHEXOL 350 MG/ML SOLN  COMPARISON:  None.  FINDINGS: Aortic arch: 3 vessel aortic arch with moderate calcified plaque. Brachiocephalic artery is patent without stenosis. There is mild narrowing  of the proximal to mid right subclavian artery. There is moderate, predominantly noncalcified plaque in the proximal left subclavian artery without significant stenosis.  Right carotid system: Common carotid artery is patent with mild non stenotic plaque in its proximal and mid portions. There is extensive calcified plaque at the carotid bifurcation and in the proximal ICA resulting in approximately 60% stenosis of the distal common carotid artery and less than 50% narrowing of the proximal ICA. There is mild atherosclerotic irregularity of the more distal cervical ICA without additional stenosis.  Left carotid system: Mild-to-moderate predominantly noncalcified plaque in the proximal and mid common carotid artery results in less than 50% narrowing. Mild calcified plaque in the proximal ICA does not result in significant stenosis.   Vertebral arteries: Vertebral arteries are patent with the left being dominant. There is mild-to-moderate stenosis of both vertebral artery origins.  Skeleton: Multilevel cervical disc degeneration with advanced disc space narrowing from C4-5 to C6-7.  Other neck: Bilateral calcified pleural plaques are partially visualized. Coronary artery calcification is partially seen.  IMPRESSION: 1. Extensive calcified plaque at the right carotid bifurcation with approximately 60% distal common carotid artery stenosis. 2. No significant left common or cervical internal carotid artery stenosis. 3. Mild-to-moderate stenosis of both vertebral artery origins.   Electronically Signed   By: Logan Bores   On: 04/12/2015 16:03   Mr Jodene Nam Head Wo Contrast  04/11/2015   CLINICAL DATA:  74 year old male with acute onset weakness, unable to stand. Facial droop. Initial encounter.  EXAM: MRI HEAD WITHOUT CONTRAST  MRA HEAD WITHOUT CONTRAST  TECHNIQUE: Multiplanar, multiecho pulse sequences of the brain and surrounding structures were obtained without intravenous contrast. Angiographic images of the head were obtained using MRA technique without contrast.  COMPARISON:  Head CT without contrast 1217 hours today, and earlier.  FINDINGS: MRI HEAD FINDINGS  In there is a small 4 mm somewhat vague focus of asymmetric diffusion signal in the posterior limb of the right internal capsule near the genu (series 5, image 25. Minimal if any associated T2 hyperintensity. No hemorrhage or associated mass effect.  No other restricted diffusion. Major intracranial vascular flow voids are within normal limits.  No midline shift, mass effect, evidence of mass lesion, ventriculomegaly, extra-axial collection or acute intracranial hemorrhage. Cervicomedullary junction and pituitary are within normal limits.  No cortical encephalomalacia. No definite chronic cerebral blood products. Incidental pneumatized posterior clinoid process. Tiny chronic lacunar  infarct in the right caudate head. Minimal cerebral white matter T2 and FLAIR hyperintensity, greatest in the right corona radiata.  Visible internal auditory structures appear normal. Visualized paranasal sinuses and mastoids are clear. Orbit and scalp soft tissues are within normal limits. Normal bone marrow signal. Negative visualized cervical spine.  MRA HEAD FINDINGS  Antegrade flow in the posterior circulation with dominant distal left vertebral artery. Normal left PICA origin. Tortuous but otherwise normal vertebrobasilar junction. No basilar artery stenosis. SCA and PCA origins are patent. Normal right PCA. There is mild irregularity and stenosis at the left PCA P1 segment, and possibly also at the P2 segment branching although there is MOTSA artifact also at that level. Bilateral distal PCA flow is within normal limits. Posterior communicating arteries are diminutive or absent.  Antegrade flow in both ICA siphons. Mild siphon dolichoectasia on the right. No siphon stenosis. Ophthalmic artery origins and carotid termini are normal. Dominant right ACA A1 segment. Anterior communicating artery and visualized bilateral ACA branches are within normal limits. Normal bilateral MCA origins. Visualized left MCA  branches are within normal limits. Right MCA M1 segment and visualized right MCA branches are within normal limits.  IMPRESSION: 1. Subtle acute lacunar infarct in the right internal capsule, posterior limb near the genu. No mass effect or hemorrhage. 2. No associated intracranial MRA finding. There is evidence of left PCA atherosclerosis with mild to moderate stenosis. 3. Other minimal to mild for age chronic small vessel disease.   Electronically Signed   By: Genevie Ann M.D.   On: 04/11/2015 16:53   Mr Brain Wo Contrast  04/11/2015   CLINICAL DATA:  74 year old male with acute onset weakness, unable to stand. Facial droop. Initial encounter.  EXAM: MRI HEAD WITHOUT CONTRAST  MRA HEAD WITHOUT CONTRAST   TECHNIQUE: Multiplanar, multiecho pulse sequences of the brain and surrounding structures were obtained without intravenous contrast. Angiographic images of the head were obtained using MRA technique without contrast.  COMPARISON:  Head CT without contrast 1217 hours today, and earlier.  FINDINGS: MRI HEAD FINDINGS  In there is a small 4 mm somewhat vague focus of asymmetric diffusion signal in the posterior limb of the right internal capsule near the genu (series 5, image 25. Minimal if any associated T2 hyperintensity. No hemorrhage or associated mass effect.  No other restricted diffusion. Major intracranial vascular flow voids are within normal limits.  No midline shift, mass effect, evidence of mass lesion, ventriculomegaly, extra-axial collection or acute intracranial hemorrhage. Cervicomedullary junction and pituitary are within normal limits.  No cortical encephalomalacia. No definite chronic cerebral blood products. Incidental pneumatized posterior clinoid process. Tiny chronic lacunar infarct in the right caudate head. Minimal cerebral white matter T2 and FLAIR hyperintensity, greatest in the right corona radiata.  Visible internal auditory structures appear normal. Visualized paranasal sinuses and mastoids are clear. Orbit and scalp soft tissues are within normal limits. Normal bone marrow signal. Negative visualized cervical spine.  MRA HEAD FINDINGS  Antegrade flow in the posterior circulation with dominant distal left vertebral artery. Normal left PICA origin. Tortuous but otherwise normal vertebrobasilar junction. No basilar artery stenosis. SCA and PCA origins are patent. Normal right PCA. There is mild irregularity and stenosis at the left PCA P1 segment, and possibly also at the P2 segment branching although there is MOTSA artifact also at that level. Bilateral distal PCA flow is within normal limits. Posterior communicating arteries are diminutive or absent.  Antegrade flow in both ICA siphons.  Mild siphon dolichoectasia on the right. No siphon stenosis. Ophthalmic artery origins and carotid termini are normal. Dominant right ACA A1 segment. Anterior communicating artery and visualized bilateral ACA branches are within normal limits. Normal bilateral MCA origins. Visualized left MCA branches are within normal limits. Right MCA M1 segment and visualized right MCA branches are within normal limits.  IMPRESSION: 1. Subtle acute lacunar infarct in the right internal capsule, posterior limb near the genu. No mass effect or hemorrhage. 2. No associated intracranial MRA finding. There is evidence of left PCA atherosclerosis with mild to moderate stenosis. 3. Other minimal to mild for age chronic small vessel disease.   Electronically Signed   By: Genevie Ann M.D.   On: 04/11/2015 16:53    Microbiology: No results found for this or any previous visit (from the past 240 hour(s)).   Labs: Basic Metabolic Panel:  Recent Labs Lab 04/11/15 1218 04/11/15 1225 04/12/15 0623 04/14/15 0545  NA 137 139 141 139  K 4.5 4.5 4.2 4.1  CL 105 102 108 100*  CO2 25  --  25 29  GLUCOSE 91 86 114* 126*  BUN 13 15 8 11   CREATININE 1.15 1.10 0.91 0.97  CALCIUM 8.6*  --  8.8* 9.2   Liver Function Tests:  Recent Labs Lab 04/11/15 1218  AST 16  ALT 12*  ALKPHOS 60  BILITOT 0.3  PROT 5.9*  ALBUMIN 3.3*   No results for input(s): LIPASE, AMYLASE in the last 168 hours. No results for input(s): AMMONIA in the last 168 hours. CBC:  Recent Labs Lab 04/11/15 1218 04/11/15 1225 04/14/15 0545  WBC 6.8  --  9.0  NEUTROABS 4.8  --   --   HGB 13.1 13.9 15.0  HCT 38.0* 41.0 43.2  MCV 88.2  --  88.5  PLT 206  --  217   Cardiac Enzymes: No results for input(s): CKTOTAL, CKMB, CKMBINDEX, TROPONINI in the last 168 hours. BNP: BNP (last 3 results) No results for input(s): BNP in the last 8760 hours.  ProBNP (last 3 results) No results for input(s): PROBNP in the last 8760 hours.  CBG:  Recent  Labs Lab 04/11/15 1216  GLUCAP 80       Signed:  Tansy Lorek  [facility] 04/14/2015, 2:33 PM

## 2015-04-14 NOTE — Op Note (Signed)
    Patient name: BRAETON WOLGAMOTT MRN: [id number] DOB: [birth year] Sex: male  04/11/2015 - 04/14/2015 Pre-operative Diagnosis: Stroke Post-operative diagnosis:  Same Surgeon:  Annamarie Major Procedure Performed:  1.  Ultrasound-guided access, right femoral artery  2.  Aortic arch angiogram  3.  Second order catheterization (right common carotid artery)  4.  Right carotid angiogram   Indications:  The patient presented with a right brain stroke.  Ultrasound and CT scan have identified a lesion which there is some discrepancy over.  He is here for further evaluation.  Procedure:  The patient was identified in the holding area and taken to room 8.  The patient was then placed supine on the table and prepped and draped in the usual sterile fashion.  A time out was called.  Ultrasound was used to evaluate the right common femoral artery.  It was patent .  A digital ultrasound image was acquired.  A micropuncture needle was used to access the right common femoral artery under ultrasound guidance.  An 018 wire was advanced without resistance and a micropuncture sheath was placed.  The 018 wire was removed and a benson wire was placed.  The micropuncture sheath was exchanged for a 5 french sheath.  A pigtail catheter was advanced into the ascending aorta and an aortic arch Joetta Manners was performed.  Next using a Berenstein to catheter the innominate artery was selected followed by the common carotid artery and a carotid Joetta Manners of intracranial images were acquired.  The intracranial images will be separately dictated by neuroradiology.  Findings:   Aortic arch:  A type I aortic arch is identified.  There is no ostial stenosis of the great vessels.  The visualized portion of the left subclavian and left vertebral artery are widely patent.  The innominate and right subclavian artery is widely patent.  The left common carotid artery is widely patent.  The left carotid bifurcation is widely  patent.  Right carotid artery:  The proximal right common carotid artery is widely patent.  In the distal common carotid at the bifurcation there is eccentric plaque with an irregular appearance of the artery.  Stenosis is likely greater than 50%.  The appearance of the plaque suggests a possible source for his stroke    Intervention:  None  Impression:  #1  irregular greater than 50% right distal common and bifurcation carotid stenosis  #2  based on the appearance of the plaque morphology, this is a potential source for his stroke  #3  type I aortic arch    Theotis Burrow, M.D. [facility] Office: [phone] Pager:  [phone]

## 2015-04-15 ENCOUNTER — Encounter (HOSPITAL_COMMUNITY): Payer: Self-pay | Admitting: Surgery

## 2015-04-15 ENCOUNTER — Other Ambulatory Visit: Payer: Self-pay

## 2015-04-15 NOTE — Progress Notes (Signed)
Rec'd phone call from Dr. Leonie Man 04/14/15.  Advised that it is okay for pt. to hold his study drug prior to Right Carotid Endarterectomy, scheduled 04/21/15, as there is 50% chance the medication is Plavix, and 50 % chance it is a placebo. Dr. Leonie Man stated it would be up to the surgeon how long to hold the study drug preoperatively.  Recommended pt. Restart the study drug as soon as possible after surgery.  Will make Dr. Oneida Alar aware of Dr. Clydene Fake recommendation.

## 2015-04-19 ENCOUNTER — Encounter (HOSPITAL_COMMUNITY): Payer: Self-pay

## 2015-04-19 ENCOUNTER — Encounter (HOSPITAL_COMMUNITY)
Admission: RE | Admit: 2015-04-19 | Discharge: 2015-04-19 | Disposition: A | Discharge status: Home / Self Care | Payer: Medicare Other | Source: Ambulatory Visit | Attending: Vascular Surgery | Admitting: Vascular Surgery

## 2015-04-19 ENCOUNTER — Other Ambulatory Visit (HOSPITAL_COMMUNITY): Payer: Self-pay | Admitting: *Deleted

## 2015-04-19 HISTORY — DX: Depression, unspecified: F32.A

## 2015-04-19 HISTORY — DX: Unspecified osteoarthritis, unspecified site: M19.90

## 2015-04-19 HISTORY — DX: Personal history of other diseases of the digestive system: Z87.19

## 2015-04-19 HISTORY — DX: Peripheral vascular disease, unspecified: I73.9

## 2015-04-19 HISTORY — DX: Cerebral infarction, unspecified: I63.9

## 2015-04-19 HISTORY — DX: Gastro-esophageal reflux disease without esophagitis: K21.9

## 2015-04-19 HISTORY — DX: Major depressive disorder, single episode, unspecified: F32.9

## 2015-04-19 LAB — SURGICAL PCR SCREEN
MRSA, PCR: NEGATIVE
STAPHYLOCOCCUS AUREUS: NEGATIVE

## 2015-04-19 LAB — CBC
HEMATOCRIT: 41.7 % (ref 39.0–52.0)
Hemoglobin: 14.3 g/dL (ref 13.0–17.0)
MCH: 30.5 pg (ref 26.0–34.0)
MCHC: 34.3 g/dL (ref 30.0–36.0)
MCV: 88.9 fL (ref 78.0–100.0)
Platelets: 225 10*3/uL (ref 150–400)
RBC: 4.69 MIL/uL (ref 4.22–5.81)
RDW: 14.1 % (ref 11.5–15.5)
WBC: 7.3 10*3/uL (ref 4.0–10.5)

## 2015-04-19 LAB — COMPREHENSIVE METABOLIC PANEL
ALBUMIN: 3.7 g/dL (ref 3.5–5.0)
ALK PHOS: 78 U/L (ref 38–126)
ALT: 31 U/L (ref 17–63)
AST: 24 U/L (ref 15–41)
Anion gap: 6 (ref 5–15)
BUN: 7 mg/dL (ref 6–20)
CHLORIDE: 104 mmol/L (ref 101–111)
CO2: 28 mmol/L (ref 22–32)
Calcium: 9.2 mg/dL (ref 8.9–10.3)
Creatinine, Ser: 0.94 mg/dL (ref 0.61–1.24)
GFR calc Af Amer: >60 mL/min (ref >60)
GFR calc non Af Amer: >60 mL/min (ref >60)
GLUCOSE: 148 mg/dL — AB (ref 65–99)
Potassium: 4.3 mmol/L (ref 3.5–5.1)
Sodium: 138 mmol/L (ref 135–145)
Total Bilirubin: 0.5 mg/dL (ref 0.3–1.2)
Total Protein: 6.7 g/dL (ref 6.5–8.1)

## 2015-04-19 LAB — ABO/RH: ABO/RH(D): O POS

## 2015-04-19 LAB — TYPE AND SCREEN
ABO/RH(D): O POS
ANTIBODY SCREEN: NEGATIVE

## 2015-04-19 NOTE — Progress Notes (Signed)
Pt denies cardiac history, denies chest pain or sob. Had stroke a week ago, no deficits noted today. Pt was on a study drug (Plavix or placebo) while in the hospital, ok'ed by neurologist for it to be held prior to surgery - last dose was 04/14/15.

## 2015-04-19 NOTE — Pre-Procedure Instructions (Signed)
Trevor Ward  04/19/2015      Your procedure is scheduled on Wednesday, April 21, 2015 at 8:30 AM.   Report to [facility] Entrance "A" Admitting Office at 6:30 AM.   Call this number if you have problems the morning of surgery: [phone]    Remember:  Do not eat food or drink liquids after midnight Tuesday, 04/20/15.  Take these medicines the morning of surgery with A SIP OF WATER: Aspirin   Do not wear jewelry.  Do not wear lotions, powders, or cologne.  You may wear deodorant.  Men may shave face and neck.  Do not bring valuables to the hospital.  [facility] is not responsible for any belongings or valuables.  Contacts, dentures or bridgework may not be worn into surgery.  Leave your suitcase in the car.  After surgery it may be brought to your room.  For patients admitted to the hospital, discharge time will be determined by your treatment team.  Special instructions:  [facility] - Preparing for Surgery  Before surgery, you can play an important role.  Because skin is not sterile, your skin needs to be as free of germs as possible.  You can reduce the number of germs on you skin by washing with CHG (chlorahexidine gluconate) soap before surgery.  CHG is an antiseptic cleaner which kills germs and bonds with the skin to continue killing germs even after washing.  Please DO NOT use if you have an allergy to CHG or antibacterial soaps.  If your skin becomes reddened/irritated stop using the CHG and inform your nurse when you arrive at Short Stay.  Do not shave (including legs and underarms) for at least 48 hours prior to the first CHG shower.  You may shave your face.  Please follow these instructions carefully:   1.  Shower with CHG Soap the night before surgery and the                                morning of Surgery.  2.  If you choose to wash your hair, wash your hair first as usual with your       normal shampoo.  3.  After you shampoo, rinse your hair  and body thoroughly to remove the                      Shampoo.  4.  Use CHG as you would any other liquid soap.  You can apply chg directly       to the skin and wash gently with scrungie or a clean washcloth.  5.  Apply the CHG Soap to your body ONLY FROM THE NECK DOWN.        Do not use on open wounds or open sores.  Avoid contact with your eyes, ears, mouth and genitals (private parts).  Wash genitals (private parts) with your normal soap.  6.  Wash thoroughly, paying special attention to the area where your surgery        will be performed.  7.  Thoroughly rinse your body with warm water from the neck down.  8.  DO NOT shower/wash with your normal soap after using and rinsing off       the CHG Soap.  9.  Pat yourself dry with a clean towel.            10.  Wear  clean pajamas.            11.  Place clean sheets on your bed the night of your first shower and do not        sleep with pets.  Day of Surgery  Do not apply any lotions the morning of surgery.  Please wear clean clothes to the hospital.    Please read over the following fact sheets that you were given. Pain Booklet, Coughing and Deep Breathing, Blood Transfusion Information, MRSA Information and Surgical Site Infection Prevention

## 2015-04-20 MED ORDER — DEXTROSE 5 % IV SOLN
1.5000 g | INTRAVENOUS | Status: AC
  Administered 2015-04-21: 1.5 g via INTRAVENOUS
  Filled 2015-04-20: qty 1.5

## 2015-04-20 MED ORDER — SODIUM CHLORIDE 0.9 % IV SOLN
INTRAVENOUS | Status: DC
  Administered 2015-04-21 (×2): via INTRAVENOUS

## 2015-04-20 NOTE — Anesthesia Preprocedure Evaluation (Addendum)
Anesthesia Evaluation  Patient identified by MRN, date of birth, ID band Patient awake    Reviewed: Allergy & Precautions, NPO status , Patient's Chart, lab work & pertinent test results  Airway Mallampati: II  TM Distance: >3 FB Neck ROM: Full    Dental no notable dental hx. (+) Edentulous Upper, Edentulous Lower, Upper Dentures, Lower Dentures   Pulmonary neg pulmonary ROS, former smoker,  breath sounds clear to auscultation  Pulmonary exam normal       Cardiovascular hypertension, Pt. on medications + Peripheral Vascular Disease Normal cardiovascular examRhythm:Regular Rate:Normal     Neuro/Psych PSYCHIATRIC DISORDERS Depression CVA, No Residual Symptoms negative psych ROS   GI/Hepatic negative GI ROS, Neg liver ROS, hiatal hernia, GERD-  Medicated and Controlled,  Endo/Other  negative endocrine ROS  Renal/GU negative Renal ROS     Musculoskeletal  (+) Arthritis -,   Abdominal (+)  Abdomen: soft. Bowel sounds: normal.  Peds  Hematology negative hematology ROS (+)   Anesthesia Other Findings   Reproductive/Obstetrics negative OB ROS                         Anesthesia Physical Anesthesia Plan  ASA: III  Anesthesia Plan: General   Post-op Pain Management:    Induction: Intravenous, Rapid sequence and Cricoid pressure planned  Airway Management Planned: Oral ETT  Additional Equipment: Arterial line  Intra-op Plan:   Post-operative Plan: Extubation in OR  Informed Consent: I have reviewed the patients History and Physical, chart, labs and discussed the procedure including the risks, benefits and alternatives for the proposed anesthesia with the patient or authorized representative who has indicated his/her understanding and acceptance.   Dental advisory given  Plan Discussed with: CRNA  Anesthesia Plan Comments: (Remifentanil gtt  Pt drank small "sip" of whole milk at ~ 6am.  Discussed with Dr. Oneida Alar. Pt will be given pepcid, reglan, and bicitra and proceed ~ 9am.)       Anesthesia Quick Evaluation

## 2015-04-21 ENCOUNTER — Inpatient Hospital Stay (HOSPITAL_COMMUNITY)
Admission: RE | Admit: 2015-04-21 | Discharge: 2015-04-22 | DRG: 039 | Disposition: A | Discharge status: Home / Self Care | Payer: Medicare Other | Source: Ambulatory Visit | Attending: Vascular Surgery | Admitting: Vascular Surgery

## 2015-04-21 ENCOUNTER — Inpatient Hospital Stay (HOSPITAL_COMMUNITY): Payer: Medicare Other | Admitting: Anesthesiology

## 2015-04-21 ENCOUNTER — Encounter (HOSPITAL_COMMUNITY)
Admission: RE | Disposition: A | Discharge status: Home / Self Care | Payer: Self-pay | Source: Ambulatory Visit | Attending: Vascular Surgery

## 2015-04-21 ENCOUNTER — Encounter (HOSPITAL_COMMUNITY): Payer: Self-pay | Admitting: Certified Registered"

## 2015-04-21 DIAGNOSIS — I69392 Facial weakness following cerebral infarction: Secondary | ICD-10-CM

## 2015-04-21 DIAGNOSIS — Z79899 Other long term (current) drug therapy: Secondary | ICD-10-CM

## 2015-04-21 DIAGNOSIS — Z87891 Personal history of nicotine dependence: Secondary | ICD-10-CM

## 2015-04-21 DIAGNOSIS — E785 Hyperlipidemia, unspecified: Secondary | ICD-10-CM | POA: Diagnosis present

## 2015-04-21 DIAGNOSIS — Z006 Encounter for examination for normal comparison and control in clinical research program: Secondary | ICD-10-CM

## 2015-04-21 DIAGNOSIS — I6521 Occlusion and stenosis of right carotid artery: Principal | ICD-10-CM | POA: Diagnosis present

## 2015-04-21 DIAGNOSIS — I10 Essential (primary) hypertension: Secondary | ICD-10-CM | POA: Diagnosis present

## 2015-04-21 DIAGNOSIS — M109 Gout, unspecified: Secondary | ICD-10-CM | POA: Diagnosis present

## 2015-04-21 DIAGNOSIS — K219 Gastro-esophageal reflux disease without esophagitis: Secondary | ICD-10-CM | POA: Diagnosis present

## 2015-04-21 DIAGNOSIS — M1990 Unspecified osteoarthritis, unspecified site: Secondary | ICD-10-CM | POA: Diagnosis not present

## 2015-04-21 DIAGNOSIS — F329 Major depressive disorder, single episode, unspecified: Secondary | ICD-10-CM | POA: Diagnosis present

## 2015-04-21 DIAGNOSIS — I739 Peripheral vascular disease, unspecified: Secondary | ICD-10-CM | POA: Diagnosis not present

## 2015-04-21 DIAGNOSIS — K449 Diaphragmatic hernia without obstruction or gangrene: Secondary | ICD-10-CM | POA: Diagnosis present

## 2015-04-21 HISTORY — PX: ENDARTERECTOMY: SHX5162

## 2015-04-21 LAB — CBC
HCT: 38.3 % — ABNORMAL LOW (ref 39.0–52.0)
Hemoglobin: 13.1 g/dL (ref 13.0–17.0)
MCH: 30.4 pg (ref 26.0–34.0)
MCHC: 34.2 g/dL (ref 30.0–36.0)
MCV: 88.9 fL (ref 78.0–100.0)
PLATELETS: 198 10*3/uL (ref 150–400)
RBC: 4.31 MIL/uL (ref 4.22–5.81)
RDW: 14.2 % (ref 11.5–15.5)
WBC: 12.3 10*3/uL — ABNORMAL HIGH (ref 4.0–10.5)

## 2015-04-21 LAB — CREATININE, SERUM
CREATININE: 0.9 mg/dL (ref 0.61–1.24)
GFR calc non Af Amer: >60 mL/min (ref >60)

## 2015-04-21 SURGERY — ENDARTERECTOMY, CAROTID
Anesthesia: General | Site: Neck | Laterality: Right

## 2015-04-21 MED ORDER — ROCURONIUM BROMIDE 100 MG/10ML IV SOLN
INTRAVENOUS | Status: DC | PRN
  Administered 2015-04-21: 30 mg via INTRAVENOUS
  Administered 2015-04-21 (×4): 10 mg via INTRAVENOUS

## 2015-04-21 MED ORDER — METOCLOPRAMIDE HCL 5 MG/ML IJ SOLN: 10.0000 mg | Freq: Once | INTRAMUSCULAR | Status: DC

## 2015-04-21 MED ORDER — LISINOPRIL 10 MG PO TABS
10.0000 mg | ORAL_TABLET | Freq: Every day | ORAL | Status: DC
  Filled 2015-04-21 (×2): qty 1

## 2015-04-21 MED ORDER — FAMOTIDINE IN NACL 20-0.9 MG/50ML-% IV SOLN
20.0000 mg | Freq: Once | INTRAVENOUS | Status: AC
  Administered 2015-04-21: 20 mg via INTRAVENOUS
  Filled 2015-04-21: qty 50

## 2015-04-21 MED ORDER — DOCUSATE SODIUM 100 MG PO CAPS: 100.0000 mg | ORAL_CAPSULE | Freq: Every day | ORAL | Status: DC

## 2015-04-21 MED ORDER — SIMVASTATIN 40 MG PO TABS
40.0000 mg | ORAL_TABLET | Freq: Every day | ORAL | Status: DC
  Filled 2015-04-21 (×2): qty 1

## 2015-04-21 MED ORDER — ACETAMINOPHEN 325 MG PO TABS
325.0000 mg | ORAL_TABLET | ORAL | Status: DC | PRN
  Administered 2015-04-21: 650 mg via ORAL
  Filled 2015-04-21: qty 2

## 2015-04-21 MED ORDER — METOPROLOL TARTRATE 1 MG/ML IV SOLN: 2.0000 mg | INTRAVENOUS | Status: DC | PRN

## 2015-04-21 MED ORDER — STUDY - INVESTIGATIONAL DRUG SIMPLE RECORD
75.0000 mg | Freq: Every day | Status: DC
  Administered 2015-04-22: 75 mg via ORAL

## 2015-04-21 MED ORDER — PROTAMINE SULFATE 10 MG/ML IV SOLN
INTRAVENOUS | Status: AC
  Filled 2015-04-21: qty 5

## 2015-04-21 MED ORDER — PROPOFOL 10 MG/ML IV BOLUS
INTRAVENOUS | Status: AC
  Filled 2015-04-21: qty 20

## 2015-04-21 MED ORDER — OXYCODONE-ACETAMINOPHEN 5-325 MG PO TABS
1.0000 | ORAL_TABLET | ORAL | Status: DC | PRN
  Administered 2015-04-22: 1 via ORAL
  Filled 2015-04-21: qty 1

## 2015-04-21 MED ORDER — ROCURONIUM BROMIDE 50 MG/5ML IV SOLN
INTRAVENOUS | Status: AC
  Filled 2015-04-21: qty 1

## 2015-04-21 MED ORDER — NITROGLYCERIN 0.2 MG/ML ON CALL CATH LAB
INTRAVENOUS | Status: DC | PRN
  Administered 2015-04-21 (×4): 40 ug via INTRAVENOUS

## 2015-04-21 MED ORDER — PANTOPRAZOLE SODIUM 40 MG PO TBEC: 80.0000 mg | DELAYED_RELEASE_TABLET | Freq: Every day | ORAL | Status: DC

## 2015-04-21 MED ORDER — MEPERIDINE HCL 25 MG/ML IJ SOLN: 6.2500 mg | INTRAMUSCULAR | Status: DC | PRN

## 2015-04-21 MED ORDER — THROMBIN 20000 UNITS EX SOLR
CUTANEOUS | Status: AC
  Filled 2015-04-21: qty 20000

## 2015-04-21 MED ORDER — CITRIC ACID-SODIUM CITRATE 334-500 MG/5ML PO SOLN
30.0000 mL | Freq: Once | ORAL | Status: AC
  Administered 2015-04-21: 30 mL via ORAL
  Filled 2015-04-21: qty 30

## 2015-04-21 MED ORDER — METOCLOPRAMIDE HCL 5 MG/ML IJ SOLN
INTRAMUSCULAR | Status: DC | PRN
  Administered 2015-04-21: 10 mg via INTRAVENOUS

## 2015-04-21 MED ORDER — HEPARIN SODIUM (PORCINE) 1000 UNIT/ML IJ SOLN
INTRAMUSCULAR | Status: DC | PRN
  Administered 2015-04-21: 4000 [IU] via INTRAVENOUS
  Administered 2015-04-21: 9000 [IU] via INTRAVENOUS

## 2015-04-21 MED ORDER — SUGAMMADEX SODIUM 200 MG/2ML IV SOLN
INTRAVENOUS | Status: AC
  Filled 2015-04-21: qty 2

## 2015-04-21 MED ORDER — SUGAMMADEX SODIUM 200 MG/2ML IV SOLN
INTRAVENOUS | Status: DC | PRN
  Administered 2015-04-21: 200 mg via INTRAVENOUS

## 2015-04-21 MED ORDER — PHENYLEPHRINE HCL 10 MG/ML IJ SOLN
10.0000 mg | INTRAVENOUS | Status: DC | PRN
  Administered 2015-04-21: 25 ug/min via INTRAVENOUS

## 2015-04-21 MED ORDER — EPHEDRINE SULFATE 50 MG/ML IJ SOLN
INTRAMUSCULAR | Status: AC
  Filled 2015-04-21: qty 1

## 2015-04-21 MED ORDER — HYDRALAZINE HCL 20 MG/ML IJ SOLN: 5.0000 mg | INTRAMUSCULAR | Status: DC | PRN

## 2015-04-21 MED ORDER — FENTANYL CITRATE (PF) 250 MCG/5ML IJ SOLN
INTRAMUSCULAR | Status: AC
  Filled 2015-04-21: qty 5

## 2015-04-21 MED ORDER — PROTAMINE SULFATE 10 MG/ML IV SOLN
INTRAVENOUS | Status: DC | PRN
  Administered 2015-04-21 (×8): 10 mg via INTRAVENOUS

## 2015-04-21 MED ORDER — POTASSIUM CHLORIDE CRYS ER 20 MEQ PO TBCR: 20.0000 meq | EXTENDED_RELEASE_TABLET | Freq: Every day | ORAL | Status: DC | PRN

## 2015-04-21 MED ORDER — AMITRIPTYLINE HCL 10 MG PO TABS
10.0000 mg | ORAL_TABLET | Freq: Every day | ORAL | Status: DC
  Filled 2015-04-21 (×2): qty 1

## 2015-04-21 MED ORDER — ARTIFICIAL TEARS OP OINT
TOPICAL_OINTMENT | OPHTHALMIC | Status: AC
  Filled 2015-04-21: qty 3.5

## 2015-04-21 MED ORDER — ALUM & MAG HYDROXIDE-SIMETH 200-200-20 MG/5ML PO SUSP: 15.0000 mL | ORAL | Status: DC | PRN

## 2015-04-21 MED ORDER — SUCCINYLCHOLINE CHLORIDE 20 MG/ML IJ SOLN
INTRAMUSCULAR | Status: AC
  Filled 2015-04-21: qty 1

## 2015-04-21 MED ORDER — SODIUM CHLORIDE 0.9 % IV SOLN
0.0125 ug/kg/min | INTRAVENOUS | Status: AC
  Administered 2015-04-21: .1 ug/kg/min via INTRAVENOUS
  Filled 2015-04-21: qty 2000

## 2015-04-21 MED ORDER — CISATRACURIUM BESYLATE 20 MG/10ML IV SOLN
INTRAVENOUS | Status: AC
  Filled 2015-04-21: qty 10

## 2015-04-21 MED ORDER — HYDROMORPHONE HCL 1 MG/ML IJ SOLN
INTRAMUSCULAR | Status: AC
  Filled 2015-04-21: qty 1

## 2015-04-21 MED ORDER — LIDOCAINE HCL (CARDIAC) 20 MG/ML IV SOLN
INTRAVENOUS | Status: AC
  Filled 2015-04-21: qty 5

## 2015-04-21 MED ORDER — SODIUM CHLORIDE 0.9 % IR SOLN
Status: DC | PRN
  Administered 2015-04-21: 500 mL

## 2015-04-21 MED ORDER — FENTANYL CITRATE (PF) 100 MCG/2ML IJ SOLN
INTRAMUSCULAR | Status: DC | PRN
  Administered 2015-04-21 (×2): 50 ug via INTRAVENOUS
  Administered 2015-04-21: 100 ug via INTRAVENOUS
  Administered 2015-04-21 (×2): 25 ug via INTRAVENOUS

## 2015-04-21 MED ORDER — GUAIFENESIN-DM 100-10 MG/5ML PO SYRP: 15.0000 mL | ORAL_SOLUTION | ORAL | Status: DC | PRN

## 2015-04-21 MED ORDER — ASPIRIN 325 MG PO TABS
325.0000 mg | ORAL_TABLET | Freq: Every day | ORAL | Status: DC
  Filled 2015-04-21: qty 1

## 2015-04-21 MED ORDER — ACETAMINOPHEN 650 MG RE SUPP: 325.0000 mg | RECTAL | Status: DC | PRN

## 2015-04-21 MED ORDER — PROMETHAZINE HCL 25 MG/ML IJ SOLN: 6.2500 mg | INTRAMUSCULAR | Status: DC | PRN

## 2015-04-21 MED ORDER — LIDOCAINE HCL (CARDIAC) 20 MG/ML IV SOLN
INTRAVENOUS | Status: DC | PRN
  Administered 2015-04-21: 50 mg via INTRAVENOUS

## 2015-04-21 MED ORDER — SODIUM CHLORIDE 0.9 % IV SOLN
INTRAVENOUS | Status: DC
  Administered 2015-04-21: 13:00:00 via INTRAVENOUS

## 2015-04-21 MED ORDER — PHENYLEPHRINE 40 MCG/ML (10ML) SYRINGE FOR IV PUSH (FOR BLOOD PRESSURE SUPPORT)
PREFILLED_SYRINGE | INTRAVENOUS | Status: AC
  Filled 2015-04-21: qty 10

## 2015-04-21 MED ORDER — ONDANSETRON HCL 4 MG/2ML IJ SOLN
INTRAMUSCULAR | Status: AC
  Filled 2015-04-21: qty 2

## 2015-04-21 MED ORDER — SODIUM CHLORIDE 0.9 % IV SOLN: 500.0000 mL | Freq: Once | INTRAVENOUS | Status: AC | PRN

## 2015-04-21 MED ORDER — ONDANSETRON HCL 4 MG/2ML IJ SOLN
INTRAMUSCULAR | Status: DC | PRN
  Administered 2015-04-21: 4 mg via INTRAVENOUS

## 2015-04-21 MED ORDER — ALBUMIN HUMAN 5 % IV SOLN
INTRAVENOUS | Status: DC | PRN
  Administered 2015-04-21 (×2): via INTRAVENOUS

## 2015-04-21 MED ORDER — CHLORHEXIDINE GLUCONATE CLOTH 2 % EX PADS: 6.0000 | MEDICATED_PAD | Freq: Once | CUTANEOUS | Status: DC

## 2015-04-21 MED ORDER — SENNOSIDES-DOCUSATE SODIUM 8.6-50 MG PO TABS
1.0000 | ORAL_TABLET | Freq: Every evening | ORAL | Status: DC | PRN
  Filled 2015-04-21: qty 1

## 2015-04-21 MED ORDER — ALLOPURINOL 100 MG PO TABS
100.0000 mg | ORAL_TABLET | Freq: Two times a day (BID) | ORAL | Status: DC
  Filled 2015-04-21 (×3): qty 1

## 2015-04-21 MED ORDER — ONDANSETRON HCL 4 MG/2ML IJ SOLN: 4.0000 mg | Freq: Four times a day (QID) | INTRAMUSCULAR | Status: DC | PRN

## 2015-04-21 MED ORDER — LIDOCAINE HCL (PF) 1 % IJ SOLN
INTRAMUSCULAR | Status: AC
  Filled 2015-04-21: qty 30

## 2015-04-21 MED ORDER — HYDROMORPHONE HCL 1 MG/ML IJ SOLN
0.2500 mg | INTRAMUSCULAR | Status: DC | PRN
  Administered 2015-04-21: 0.5 mg via INTRAVENOUS

## 2015-04-21 MED ORDER — MAGNESIUM SULFATE 2 GM/50ML IV SOLN
2.0000 g | Freq: Every day | INTRAVENOUS | Status: DC | PRN
Start: 2015-04-21 — End: 2015-04-22

## 2015-04-21 MED ORDER — PROPOFOL 10 MG/ML IV BOLUS
INTRAVENOUS | Status: DC | PRN
  Administered 2015-04-21: 120 mg via INTRAVENOUS
  Administered 2015-04-21: 50 mg via INTRAVENOUS

## 2015-04-21 MED ORDER — 0.9 % SODIUM CHLORIDE (POUR BTL) OPTIME
TOPICAL | Status: DC | PRN
  Administered 2015-04-21: 1000 mL

## 2015-04-21 MED ORDER — MORPHINE SULFATE 2 MG/ML IJ SOLN
2.0000 mg | INTRAMUSCULAR | Status: DC | PRN
  Administered 2015-04-21: 2 mg via INTRAVENOUS
  Filled 2015-04-21: qty 1

## 2015-04-21 MED ORDER — ENOXAPARIN SODIUM 40 MG/0.4ML ~~LOC~~ SOLN
40.0000 mg | SUBCUTANEOUS | Status: DC
  Filled 2015-04-21 (×2): qty 0.4

## 2015-04-21 MED ORDER — DEXTROSE 5 % IV SOLN
1.5000 g | Freq: Two times a day (BID) | INTRAVENOUS | Status: AC
  Administered 2015-04-21 – 2015-04-22 (×2): 1.5 g via INTRAVENOUS
  Filled 2015-04-21 (×2): qty 1.5

## 2015-04-21 MED ORDER — PHENOL 1.4 % MT LIQD
1.0000 | OROMUCOSAL | Status: DC | PRN
  Administered 2015-04-21: 1 via OROMUCOSAL
  Filled 2015-04-21: qty 177

## 2015-04-21 MED ORDER — SODIUM CHLORIDE 0.9 % IJ SOLN
INTRAMUSCULAR | Status: AC
  Filled 2015-04-21: qty 10

## 2015-04-21 MED ORDER — BISACODYL 10 MG RE SUPP: 10.0000 mg | Freq: Every day | RECTAL | Status: DC | PRN

## 2015-04-21 MED ORDER — LABETALOL HCL 5 MG/ML IV SOLN
10.0000 mg | INTRAVENOUS | Status: DC | PRN
Start: 2015-04-21 — End: 2015-04-22

## 2015-04-21 MED ORDER — SUCCINYLCHOLINE CHLORIDE 20 MG/ML IJ SOLN
INTRAMUSCULAR | Status: DC | PRN
  Administered 2015-04-21: 130 mg via INTRAVENOUS

## 2015-04-21 SURGICAL SUPPLY — 54 items
CANISTER SUCTION 2500CC (MISCELLANEOUS) ×2 IMPLANT
CANNULA VESSEL 3MM 2 BLNT TIP (CANNULA) ×2 IMPLANT
CATH ROBINSON RED A/P 18FR (CATHETERS) ×2 IMPLANT
CLIP TI MEDIUM 6 (CLIP) ×2 IMPLANT
CLIP TI WIDE RED SMALL 6 (CLIP) ×2 IMPLANT
CRADLE DONUT ADULT HEAD (MISCELLANEOUS) ×2 IMPLANT
DECANTER SPIKE VIAL GLASS SM (MISCELLANEOUS) IMPLANT
DRAIN HEMOVAC 1/8 X 5 (WOUND CARE) IMPLANT
ELECT REM PT RETURN 9FT ADLT (ELECTROSURGICAL) ×2
ELECTRODE REM PT RTRN 9FT ADLT (ELECTROSURGICAL) ×1 IMPLANT
EVACUATOR SILICONE 100CC (DRAIN) IMPLANT
GAUZE SPONGE 4X4 12PLY STRL (GAUZE/BANDAGES/DRESSINGS) ×2 IMPLANT
GEL ULTRASOUND 20GR AQUASONIC (MISCELLANEOUS) IMPLANT
GLOVE BIO SURGEON STRL SZ 6.5 (GLOVE) ×4 IMPLANT
GLOVE BIO SURGEON STRL SZ7.5 (GLOVE) ×2 IMPLANT
GLOVE BIO SURGEON STRL SZ8 (GLOVE) ×2 IMPLANT
GLOVE BIOGEL PI IND STRL 6.5 (GLOVE) ×3 IMPLANT
GLOVE BIOGEL PI IND STRL 7.5 (GLOVE) ×1 IMPLANT
GLOVE BIOGEL PI IND STRL 8 (GLOVE) ×1 IMPLANT
GLOVE BIOGEL PI INDICATOR 6.5 (GLOVE) ×3
GLOVE BIOGEL PI INDICATOR 7.5 (GLOVE) ×1
GLOVE BIOGEL PI INDICATOR 8 (GLOVE) ×1
GLOVE ECLIPSE 7.0 STRL STRAW (GLOVE) ×2 IMPLANT
GLOVE ECLIPSE 7.5 STRL STRAW (GLOVE) ×4 IMPLANT
GOWN STRL REUS W/ TWL LRG LVL3 (GOWN DISPOSABLE) ×4 IMPLANT
GOWN STRL REUS W/ TWL XL LVL3 (GOWN DISPOSABLE) ×2 IMPLANT
GOWN STRL REUS W/TWL LRG LVL3 (GOWN DISPOSABLE) ×4
GOWN STRL REUS W/TWL XL LVL3 (GOWN DISPOSABLE) ×2
KIT BASIN OR (CUSTOM PROCEDURE TRAY) ×2 IMPLANT
KIT ROOM TURNOVER OR (KITS) ×2 IMPLANT
LIQUID BAND (GAUZE/BANDAGES/DRESSINGS) ×2 IMPLANT
LOOP VESSEL MINI RED (MISCELLANEOUS) IMPLANT
NEEDLE HYPO 25GX1X1/2 BEV (NEEDLE) IMPLANT
NS IRRIG 1000ML POUR BTL (IV SOLUTION) ×4 IMPLANT
PACK CAROTID (CUSTOM PROCEDURE TRAY) ×2 IMPLANT
PAD ARMBOARD 7.5X6 YLW CONV (MISCELLANEOUS) ×4 IMPLANT
PATCH HEMASHIELD 8X75 (Vascular Products) ×2 IMPLANT
SHUNT CAROTID BYPASS 10 (VASCULAR PRODUCTS) ×2 IMPLANT
SHUNT CAROTID BYPASS 12FRX15.5 (VASCULAR PRODUCTS) IMPLANT
SPONGE INTESTINAL PEANUT (DISPOSABLE) IMPLANT
SPONGE SURGIFOAM ABS GEL 100 (HEMOSTASIS) IMPLANT
SUT ETHILON 3 0 PS 1 (SUTURE) IMPLANT
SUT PROLENE 6 0 CC (SUTURE) ×2 IMPLANT
SUT PROLENE 7 0 BV 1 (SUTURE) IMPLANT
SUT PROLENE 7 0 BV1 MDA (SUTURE) ×2 IMPLANT
SUT SILK 3 0 (SUTURE) ×1
SUT SILK 3 0 TIES 17X18 (SUTURE)
SUT SILK 3-0 18XBRD TIE 12 (SUTURE) ×1 IMPLANT
SUT SILK 3-0 18XBRD TIE BLK (SUTURE) IMPLANT
SUT VIC AB 3-0 SH 27 (SUTURE) ×1
SUT VIC AB 3-0 SH 27X BRD (SUTURE) ×1 IMPLANT
SUT VICRYL 4-0 PS2 18IN ABS (SUTURE) ×2 IMPLANT
SYR CONTROL 10ML LL (SYRINGE) IMPLANT
WATER STERILE IRR 1000ML POUR (IV SOLUTION) IMPLANT

## 2015-04-21 NOTE — Anesthesia Procedure Notes (Signed)
Procedure Name: Intubation Date/Time: 04/21/2015 9:11 AM Performed by: Lavell Luster Pre-anesthesia Checklist: Patient identified, Emergency Drugs available, Suction available, Patient being monitored and Timeout performed Patient Re-evaluated:Patient Re-evaluated prior to inductionOxygen Delivery Method: Circle system utilized Preoxygenation: Pre-oxygenation with 100% oxygen Intubation Type: Rapid sequence and IV induction Laryngoscope Size: Mac and 4 Grade View: Grade I Tube type: Oral Tube size: 7.5 mm Number of attempts: 1 Placement Confirmation: ETT inserted through vocal cords under direct vision,  positive ETCO2 and breath sounds checked- equal and bilateral Secured at: 22 cm Tube secured with: Tape Dental Injury: Teeth and Oropharynx as per pre-operative assessment  Comments: Easy atraumatic induction and intubation by Jamesetta Geralds, CRNA and MDA present.  Placement verified.  Henderson Cloud, CRNA

## 2015-04-21 NOTE — Transfer of Care (Signed)
Immediate Anesthesia Transfer of Care Note  Patient: Trevor Ward  Procedure(s) Performed: Procedure(s): RIGHT CAROTID ENDARTERECTOMY (Right)  Patient Location: PACU  Anesthesia Type:General  Level of Consciousness: awake, alert  and oriented  Airway & Oxygen Therapy: Patient connected to face mask oxygen  Post-op Assessment: Report given to RN  Post vital signs: stable  Last Vitals:  Filed Vitals:   04/21/15 1245  BP: 133/73  Pulse: 96  Temp:   Resp: 17    Complications: No apparent anesthesia complications

## 2015-04-21 NOTE — H&P (View-Only) (Signed)
[facility] [place]  Subjective  - no change   Objective 129/72 72 98.6 F (37 C) (Oral) 17 99% No intake or output data in the 24 hours ending 04/14/15 1253  Neuro status unchanged still with mild facial droop  Assessment/Planning: Angiogram reviewed.  Right carotid stenosis > 50% and potential embolic source. Stop Plavix today Right CEA next Wednesday.  Ok to d/c from my standpoint Ok to continue aspirin  Ruta Hinds 04/14/2015 12:53 PM --  Laboratory Lab Results:  Recent Labs  04/14/15 0545  WBC 9.0  HGB 15.0  HCT 43.2  PLT 217   BMET  Recent Labs  04/12/15 0623 04/14/15 0545  NA 141 139  K 4.2 4.1  CL 108 100*  CO2 25 29  GLUCOSE 114* 126*  BUN 8 11  CREATININE 0.91 0.97  CALCIUM 8.8* 9.2    COAG Lab Results  Component Value Date   INR 1.05 04/11/2015   No results found for: PTT

## 2015-04-21 NOTE — Interval H&P Note (Signed)
History and Physical Interval Note:  04/21/2015 8:17 AM  Trevor Ward  has presented today for surgery, with the diagnosis of Right carotid artery stenosis I65.21  The various methods of treatment have been discussed with the patient and family. After consideration of risks, benefits and other options for treatment, the patient has consented to  Procedure(s): ENDARTERECTOMY CAROTID (Right) as a surgical intervention .  [name] history has been reviewed, patient examined, no change in status, stable for surgery.  I have reviewed the patient's chart and labs.  Questions were answered to the patient's satisfaction.     Ruta Hinds

## 2015-04-21 NOTE — Care Management Note (Signed)
Case Management Note  Patient Details  Name: Trevor Ward MRN: [id number] Date of Birth: 1941-03-09  Subjective/Objective:                 Admitted from home with wife s/p R carotid endarterectomy.   Action/Plan: Return to home when medically stable. CM to f/u with d/c needs.  Expected Discharge Date:                  Expected Discharge Plan:  Home/Self Care  In-House Referral:     Discharge planning Services  CM Consult  Post Acute Care Choice:    Choice offered to:     DME Arranged:    DME Agency:     HH Arranged:    [place] Agency:     Status of Service:  Completed, signed off  Medicare Important Message Given:    Date Medicare IM Given:    Medicare IM give by:    Date Additional Medicare IM Given:    Additional Medicare Important Message give by:     If discussed at [place] of Stay Meetings, dates discussed:    Additional Comments: Chane Cowden (Spouse) [phone] Nyoka Cowden (Daughter) [phone]  Sharin Mons, RN 04/21/2015, 7:07 PM

## 2015-04-21 NOTE — Op Note (Signed)
Procedure Right carotid endarterectomy  Preoperative diagnosis: High-grade symptomatic right internal carotid artery stenosis  Postoperative diagnosis: Same  Anesthesia General  Asst.: Gerri Lins, [facility]  Operative findings: #1 greater than 70% right internal carotid stenosis with exophytic shelflike plaque                                                              #2 Dacron patch           #3 10 Fr shunt  Operative details: After obtaining informed consent, the patient was taken to the operating room. The patient was placed in a supine position on the operating room table. After induction of general anesthesia and endotracheal intubation a Foley catheter was placed. Next the patient's entire neck and chest was prepped and draped in the usual sterile fashion. An oblique incision was made on the right aspect of the patient's neck anterior to the border the right sternocleidomastoid muscle. The incision was carried into the subcutaneous tissues and through the platysma. The sternocleidomastoid muscle was identified and reflected laterally.  The common carotid artery was then found at the base of the incision this was dissected free circumferentially. It was fairly soft on palpation.  The vagus nerve was identified and protected. Dissection was then carried up to the level carotid bifurcation. The bifurcation was relatively high and this required fairly extensive dissection to mobilize the nerve and get to a suitable segment of distal internal carotid artery.  The hyperglossal nerve was draped over the portion of internal that had palpable disease.  I ligated the common facial vein and several tethering branches as well as the digastric muscle in order to provide exposure. The internal carotid artery was dissected free circumferentiallyand it was soft in character at this location and above any palpable disease. A vessel loop was placed around this.  Next the external carotid and superior thyroid  arteries were dissected free circumferentially.  The external carotid was rotated over the internal so I ligated and divided the superior thyroid artery in order to mobilize and rotate the external more medially. The patient was given 8000 units of intravenous heparin.  After 2 minutes of circulation time and raising the mean arterial pressure to 90 mm mercury, the distal internal carotid artery was controlled with small bulldog clamp. The external carotid and superior thyroid arteries were controlled with vessel loops. The common carotid artery was controlled with a peripheral DeBakey clamp. A longitudinal opening was made in the common carotid artery just below the bifurcation. The arteriotomy was extended distally up into the internal carotid with Potts scissors. There was a large calcified plaque with greater than 70% stenosis in the internal carotid as well as a shelf like exophytic plaque that could easily be a source of embolization.    A 10 Fr shunt was brought onto the field and fashioned to fit the patient's artery.  This was threaded into the distal internal carotid artery and allowed to backbleed thoroughly.  There was pulsatile backbleeding.  This was then threaded into the common carotid and secured with a Rummel tourniquet.   There was no air at this point and flow was restored to the brain.  Attention was then turned to the common carotid artery once again. A suitable endarterectomy plane was obtained and  endarterectomy was begun in the common carotid artery and a good proximal endpoint was obtained. An eversion endarterectomy was performed on the external carotid artery and a good endpoint was obtained. The plaque was then elevated in the internal carotid artery and a nice feathered distal endpoint was also obtained.  There was some lifting of the distal internal so I tacked this with four 7 0 prolene tacking sutures.  The plaque was passed off the table. All loose debris was then removed from the  carotid bed and everything was thoroughly irrigated with heparinized saline. A Dacron patch was then brought on to the operative field and this was sewn on as a patch angioplasty using a running 6-0 Prolene suture. Prior to completion of the anastomosis the internal carotid artery was thoroughly backbled. This was then controlled again with a fine bulldog clamp.  The common carotid was thoroughly flushed forward. The external carotid was also thoroughly backbled.  The remainder of the patch was completed and the anastomosis was secured. Flow was then restored first retrograde from the external carotid into the carotid bed then antegrade from the common carotid to the external carotid artery and after approximately 5 cardiac cycles to the internal carotid artery. Doppler was used to evaluate the external/internal and common carotid arteries and these all had good Doppler flow. Hemostasis was obtained with 1 additional repair suture. The patient was given an additional 4000 units of heparin during the case.  The patient was also given 80 mg of Protamine at the end of the case to achieve hemostasis.     The platysma muscle was reapproximated using a running 3-0 Vicryl suture. The skin was closed with 4 0 Vicryl subcuticular stitch.  The patient was awakened in the operating room and was moving upper and lower extremities symmetrically and following commands.  The patient was stable on arrival to the PACU.  Ruta Hinds, MD [facility] [place] Office: [phone] Pager: [phone]

## 2015-04-21 NOTE — Anesthesia Postprocedure Evaluation (Signed)
Anesthesia Post Note  Patient: Trevor Ward  Procedure(s) Performed: Procedure(s) (LRB): RIGHT CAROTID ENDARTERECTOMY (Right)  Anesthesia type: General  Patient location: PACU  Post pain: Pain level controlled  Post assessment: Post-op Vital signs reviewed  Last Vitals: BP 134/79 mmHg  Pulse 82  Temp(Src) 36.6 C  Resp 18  Ht 5\' 10"  (1.778 m)  Wt 195 lb 1.7 oz (88.5 kg)  BMI 27.99 kg/m2  SpO2 94%  Post vital signs: Reviewed  Level of consciousness: sedated  Complications: No apparent anesthesia complications

## 2015-04-21 NOTE — Progress Notes (Signed)
UR COMPLETED  

## 2015-04-21 NOTE — Progress Notes (Signed)
  [facility] Day [facility] Note  Subjective:  Patient seen in PACU. Having some incisional pain.   Filed Vitals:   04/21/15 1400  BP: 128/66  Pulse: 81  Temp: 98.4 F (36.9 C)  Resp: 14    Incisions:  Left neck incision clean and intact without hematoma Extremities:  Residual left facial droop. Tongue midline. Moving all extremities equally. Slightly decreased left grip strength 4/5. Otherwise 5/5 all extremities.   Assessment/Plan:  This is a 74 y.o. male who is s/p right carotid endarterectomy, Day of Surgery  Stable post-op Neuro intact Incision w/o hematoma To 3S when bed available.    Virgina Jock, [place] Pager: [phone] 04/21/2015 2:22 PM

## 2015-04-22 ENCOUNTER — Encounter (HOSPITAL_COMMUNITY): Payer: Self-pay | Admitting: Vascular Surgery

## 2015-04-22 ENCOUNTER — Telehealth: Payer: Self-pay | Admitting: Vascular Surgery

## 2015-04-22 LAB — CBC
HCT: 37.7 % — ABNORMAL LOW (ref 39.0–52.0)
Hemoglobin: 12.9 g/dL — ABNORMAL LOW (ref 13.0–17.0)
MCH: 30.4 pg (ref 26.0–34.0)
MCHC: 34.2 g/dL (ref 30.0–36.0)
MCV: 88.7 fL (ref 78.0–100.0)
PLATELETS: 219 10*3/uL (ref 150–400)
RBC: 4.25 MIL/uL (ref 4.22–5.81)
RDW: 14.1 % (ref 11.5–15.5)
WBC: 11.6 10*3/uL — ABNORMAL HIGH (ref 4.0–10.5)

## 2015-04-22 LAB — BASIC METABOLIC PANEL
Anion gap: 7 (ref 5–15)
BUN: 9 mg/dL (ref 6–20)
CALCIUM: 8.8 mg/dL — AB (ref 8.9–10.3)
CO2: 25 mmol/L (ref 22–32)
Chloride: 103 mmol/L (ref 101–111)
Creatinine, Ser: 0.92 mg/dL (ref 0.61–1.24)
GFR calc Af Amer: >60 mL/min (ref >60)
GFR calc non Af Amer: >60 mL/min (ref >60)
GLUCOSE: 138 mg/dL — AB (ref 65–99)
Potassium: 4.1 mmol/L (ref 3.5–5.1)
SODIUM: 135 mmol/L (ref 135–145)

## 2015-04-22 MED ORDER — OXYCODONE HCL 5 MG PO TABS: 5.0000 mg | ORAL_TABLET | Freq: Four times a day (QID) | ORAL | Status: DC | PRN

## 2015-04-22 NOTE — Telephone Encounter (Signed)
-----   Message from Mena Goes, RN sent at 04/22/2015  8:33 AM EDT ----- Regarding: Schedule   ----- Message -----    From: Gabriel Earing, PA-C    Sent: 04/22/2015   7:52 AM      To: [facility] Charge Pool  S/p right CEA 04/21/15.  F/u with Dr. Oneida Alar in 2 weeks.  Thanks, Aldona Bar

## 2015-04-22 NOTE — Progress Notes (Signed)
Pt given discharge packet. Medication regimen reviewed and education given. Pt states no further questions at this time.

## 2015-04-22 NOTE — Telephone Encounter (Signed)
Unable to reach pt, phone # is busy- mailed letter, dpm

## 2015-04-22 NOTE — Progress Notes (Signed)
Pt requesting to take his morning medications when he gets home after discharge. Will continue to monitor.

## 2015-04-22 NOTE — Discharge Summary (Signed)
Discharge Summary     Trevor Ward 09-Jun-1941 74 y.o. male  [id number]  Admission Date: 04/21/2015  Discharge Date: 04/21/15  Physician: Elam Dutch, MD  Admission Diagnosis: Right carotid artery stenosis I65.21   HPI:   This is a 74 y.o. male who states he was at a ball game when he starting having right sided weakness and left sided facial droop. He was having difficulty walking as he was not able to put any weight on the right side of his body. Pt states that he was feeling hot and felt like he needed rest. He was brought to the ER and was found to have a subtle acute lacunar infarct in the right internal capsule, posterior limb near the genu. There was no mass effect or hemorrhage. A carotid duplex was obtained and revealed 40-59% stenosis of the right ICA and 1-39% stenosis of the left ICA.  He does take an ACEI for hypertension. He was not on a statin at home, but has been placed on one here.   Hospital Course:  The patient was admitted to the hospital and taken to the operating room on 04/21/2015 and underwent right carotid endarterectomy.  The pt tolerated the procedure well and was transported to the PACU in good condition.   By POD 1, the pt neuro status in tact.  Tongue is midline and he does not have any difficulty swallowing.  Pt has voided.  The remainder of the hospital course consisted of increasing mobilization and increasing intake of solids without difficulty.    Recent Labs  04/19/15 1045 04/22/15 0352  NA 138 135  K 4.3 4.1  CL 104 103  CO2 28 25  GLUCOSE 148* 138*  BUN 7 9  CALCIUM 9.2 8.8*    Recent Labs  04/21/15 1650 04/22/15 0352  WBC 12.3* 11.6*  HGB 13.1 12.9*  HCT 38.3* 37.7*  PLT 198 219   No results for input(s): INR in the last 72 hours.   Discharge Instructions    CAROTID Sugery: Call MD for difficulty swallowing or speaking; weakness in arms or legs that is a new symtom; severe headache.  If you have  increased swelling in the neck and/or  are having difficulty breathing, CALL [phone]    Complete by:  As directed      Call MD for:  redness, tenderness, or signs of infection (pain, swelling, bleeding, redness, odor or green/yellow discharge around incision site)    Complete by:  As directed      Call MD for:  severe or increased pain, loss or decreased feeling  in affected limb(s)    Complete by:  As directed      Call MD for:  temperature >100.5    Complete by:  As directed      Discharge wound care:    Complete by:  As directed   Shower daily with soap and water starting 04/23/15     Driving Restrictions    Complete by:  As directed   No driving for 2 weeks     Lifting restrictions    Complete by:  As directed   No lifting for 2 weeks     Resume previous diet    Complete by:  As directed            Discharge Diagnosis:  Right carotid artery stenosis I65.21  Secondary Diagnosis: Patient Active Problem List   Diagnosis Date Noted  . Carotid stenosis   . Stroke   .  HLD (hyperlipidemia)   . HTN (hypertension) 04/11/2015  . Gout 04/11/2015  . CVA (cerebral infarction) 04/11/2015   Past Medical History  Diagnosis Date  . Hypertension   . Stroke   . Peripheral vascular disease     carotid artery stenosis  . History of hiatal hernia   . Depression   . GERD (gastroesophageal reflux disease)   . Arthritis   . Cancer     skin cancer - on face "the good kind"  . Gout       Medication List    TAKE these medications        allopurinol 100 MG tablet  Commonly known as:  ZYLOPRIM  Take 100 mg by mouth 2 (two) times daily.     amitriptyline 10 MG tablet  Commonly known as:  ELAVIL  Take 10 mg by mouth at bedtime.     aspirin 325 MG tablet  Take 1 tablet (325 mg total) by mouth daily.     esomeprazole 40 MG capsule  Commonly known as:  NEXIUM  Take 40 mg by mouth daily at 12 noon.     lisinopril 10 MG tablet  Commonly known as:  PRINIVIL,ZESTRIL  Take 10 mg by  mouth daily.     oxyCODONE 5 MG immediate release tablet  Commonly known as:  ROXICODONE  Take 1 tablet (5 mg total) by mouth every 6 (six) hours as needed.     research study medication  Take 75 mg by mouth daily with breakfast.     simvastatin 40 MG tablet  Commonly known as:  ZOCOR  Take 1 tablet (40 mg total) by mouth daily at 6 PM.        Prescriptions given: Roxicodone #20 No Refill  Instructions: 1.  Shower daily with soap and water starting 04/23/15   Disposition: home  Patient's condition: is Good  Follow up: 1. Dr. Oneida Alar in 2 weeks.   Leontine Locket, PA-C [facility] [phone]  --- For [facility] use --- Instructions: Press F2 to tab through selections.  Delete question if not applicable.   Modified Rankin score at D/C (0-6): 1  IV medication needed for:  1. Hypertension: No 2. Hypotension: No  Post-op Complications: No  1. Post-op CVA or TIA: No  If yes: Event classification (right eye, left eye, right cortical, left cortical, verterobasilar, other): n/a  If yes: Timing of event (intra-op, <6 hrs post-op, >=6 hrs post-op, unknown): n/a  2. CN injury: No  If yes: CN n/a injuried   3. Myocardial infarction: No  If yes: Dx by (EKG or clinical, Troponin): n/a  4.  CHF: No  5.  Dysrhythmia (new): No  6. Wound infection: No  7. Reperfusion symptoms: No  8. Return to OR: No  If yes: return to OR for (bleeding, neurologic, other CEA incision, other): n/a  Discharge medications: Statin use:  Yes If No: [ ]  For Medical reasons, [ ]  Non-compliant, [ ]  Not-indicated ASA use:  Yes  If No: [ ]  For Medical reasons, [ ]  Non-compliant, [ ]  Not-indicated Beta blocker use:  No If No: [ ]  For Medical reasons, [ ]  Non-compliant, [ ]  Not-indicated ACE-Inhibitor use:  Yes If No: [ ]  For Medical reasons, [ ]  Non-compliant, [ ]  Not-indicated P2Y12 Antagonist use: on research drug, [ ]  Plavix, [ ]  Plasugrel, [ ]  Ticlopinine, [ ]   Ticagrelor, [ ]  Other, [ ]  No for medical reason, [ ]  Non-compliant, [ ]  Not-indicated Anti-coagulant use:  No, [ ]  Warfarin, [ ]  Rivaroxaban, [ ]  Dabigatran, [ ]  Other, [ ]  No for medical reason, [ ]  Non-compliant, [ ]  Not-indicated

## 2015-04-22 NOTE — Progress Notes (Addendum)
  Progress Note    04/22/2015 7:49 AM 1 Day Post-Op  Subjective:  No complaints-ready to go home  Afebrile HR 80's NSR 161'W-960'A systolic 54% RA  Filed Vitals:   04/22/15 0346  BP: 134/64  Pulse: 83  Temp: 98.7 F (37.1 C)  Resp: 24     Physical Exam: Neuro:  In tact; tongue is midline; no difficulty swallowing Incision:  C/d/i with mild fullness  CBC    Component Value Date/Time   WBC 11.6* 04/22/2015 0352   RBC 4.25 04/22/2015 0352   HGB 12.9* 04/22/2015 0352   HCT 37.7* 04/22/2015 0352   PLT 219 04/22/2015 0352   MCV 88.7 04/22/2015 0352   MCH 30.4 04/22/2015 0352   MCHC 34.2 04/22/2015 0352   RDW 14.1 04/22/2015 0352   LYMPHSABS 1.4 04/11/2015 1218   MONOABS 0.4 04/11/2015 1218   EOSABS 0.2 04/11/2015 1218   BASOSABS 0.0 04/11/2015 1218    BMET    Component Value Date/Time   NA 135 04/22/2015 0352   K 4.1 04/22/2015 0352   CL 103 04/22/2015 0352   CO2 25 04/22/2015 0352   GLUCOSE 138* 04/22/2015 0352   BUN 9 04/22/2015 0352   CREATININE 0.92 04/22/2015 0352   CALCIUM 8.8* 04/22/2015 0352   GFRNONAA >60 04/22/2015 0352   GFRAA >60 04/22/2015 0352     Intake/Output Summary (Last 24 hours) at 04/22/15 0749 Last data filed at 04/22/15 0500  Gross per 24 hour  Intake   3085 ml  Output   1450 ml  Net   1635 ml      Assessment/Plan:  This is a 74 y.o. male who is s/p right CEA 1 Day Post-Op  -pt is doing well this am. -pt neuro exam is in tact -pt has ambulated -pt has voided -pt is not having any difficulty swallowing-only complains of sore throat.  -discharge home after breakfast -f/u with Dr. Oneida Alar in 2 weeks.   Leontine Locket, PA-C [facility] [phone]   Agree with above D/c home Resume study drug and aspirin  Ruta Hinds, MD [facility] [place] Office: [phone] Pager: [phone]

## 2015-04-23 NOTE — Consult Note (Signed)
Trevor Ward, Trevor Ward              ACCOUNT NO.:  [id number]  MEDICAL RECORD NO.:  [id number]  LOCATION:  3S03C                        FACILITY:  [facility]  PHYSICIAN:  Amaurie Schreckengost K. Sarrinah Gardin, M.D.DATE OF BIRTH:  30-Apr-1941  DATE OF CONSULTATION: DATE OF DISCHARGE:  04/22/2015                                CONSULTATION   CLINICAL HISTORY:  Right carotid stenosis.  EXAMINATION:  Intracranial interpretation of right common carotid artery arteriogram.  FINDINGS:  The distal right internal carotid artery cervical segment is widely patent.  On the lateral projection, there is mild fusiform prominence of the petrous segment of the right internal carotid artery.  The cavernous and supraclinoid segments are widely patent.  The right middle and the right anterior cerebral arteries are seen to opacify into the capillary and venous phases.  Cross opacification via the anterior communicating artery of the left anterior cerebral A2 segment is noted.  IMPRESSION:  Mild fusiform prominence of the petrous segment of the right internal carotid artery.          ______________________________ Trevor Ward, M.D.     SKD/MEDQ  D:  04/22/2015  T:  04/23/2015  Job:  [id number]

## 2015-05-04 ENCOUNTER — Encounter: Payer: Self-pay | Admitting: Vascular Surgery

## 2015-05-05 ENCOUNTER — Encounter: Payer: Self-pay | Admitting: Vascular Surgery

## 2015-05-05 ENCOUNTER — Ambulatory Visit (INDEPENDENT_AMBULATORY_CARE_PROVIDER_SITE_OTHER): Payer: Self-pay | Admitting: Vascular Surgery

## 2015-05-05 VITALS — BP 112/75 | HR 88 | Temp 98.0°F | Resp 18 | Ht 70.0 in | Wt 188.5 lb

## 2015-05-05 DIAGNOSIS — Z9889 Other specified postprocedural states: Secondary | ICD-10-CM

## 2015-05-05 DIAGNOSIS — Z8673 Personal history of transient ischemic attack (TIA), and cerebral infarction without residual deficits: Secondary | ICD-10-CM

## 2015-05-05 DIAGNOSIS — Z48812 Encounter for surgical aftercare following surgery on the circulatory system: Secondary | ICD-10-CM

## 2015-05-05 NOTE — Progress Notes (Signed)
Postoperative Visit   History of Present Illness  Trevor Ward is a 74 y.o. male patient of Dr. Oneida Alar who presents for postoperative follow-up for: right CEA (Date: 04/21/15).  [name] neck incision is well healed with minimal swelling.   He returns today for 2 weeks post operative evaluation. He has not had any post operative TIA or stroke symptoms.  He had a pre-opererative stoke 04/11/15 as manifested by left facial drooping and staggering. He was evaluated that evening at [facility] per wife.  Wife states that MRI shows he had a small stroke.  He no longer staggers but still has slight left facial drooping. He denies any history of amaurosis fugax, hemiparesis, or speech difficulties.  For VQI Use Only  PRE-ADM LIVING: Home  AMB STATUS: Ambulatory  Social History   Social History  . Marital Status: Married    Spouse Name: N/A  . Number of Children: N/A  . Years of Education: N/A   Occupational History  . Not on file.   Social History Main Topics  . Smoking status: Former Research scientist (life sciences)  . Smokeless tobacco: Never Used  . Alcohol Use: No  . Drug Use: No  . Sexual Activity: Not on file   Other Topics Concern  . Not on file   Social History Narrative    Current Outpatient Prescriptions on File Prior to Visit  Medication Sig Dispense Refill  . allopurinol (ZYLOPRIM) 100 MG tablet Take 100 mg by mouth 2 (two) times daily.    Marland Kitchen amitriptyline (ELAVIL) 10 MG tablet Take 10 mg by mouth at bedtime.    Marland Kitchen aspirin 325 MG tablet Take 1 tablet (325 mg total) by mouth daily.    Marland Kitchen esomeprazole (NEXIUM) 40 MG capsule Take 40 mg by mouth daily at 12 noon.    Marland Kitchen lisinopril (PRINIVIL,ZESTRIL) 10 MG tablet Take 10 mg by mouth daily.    Marland Kitchen oxyCODONE (ROXICODONE) 5 MG immediate release tablet Take 1 tablet (5 mg total) by mouth every 6 (six) hours as needed. 20 tablet 0  . research study medication Take 75 mg by mouth daily with breakfast.    . simvastatin (ZOCOR) 40 MG tablet Take 1  tablet (40 mg total) by mouth daily at 6 PM. 30 tablet 0   No current facility-administered medications on file prior to visit.    Physical Examination  Filed Vitals:   05/05/15 1214  BP: 112/75  Pulse: 88  Temp: 98 F (36.7 C)  TempSrc: Oral  Resp: 18  Height: 5\' 10"  (1.778 m)  Weight: 188 lb 8 oz (85.503 kg)  SpO2: 99%   Body mass index is 27.05 kg/(m^2).  Right neck: Incision is well healed Neuro: CN 2-12 are intact except moderate left facial droop remains, Motor strength is 5/5 bilaterally, sensation is grossly intact  Medical Decision Making  Trevor Ward is a 74 y.o. male who presents s/p right CEA on 04/21/15.  [name] neck incision is healing with no stroke symptoms. 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 Dr. Oneida Alar spoke with him and his wife re this. He may return to this in a month using caution and safety measures. The patient is currently on an antiplatelet: ASA. The patient is currently on a statin: simvastatin.   The patient is aware that without maximal medical management the underlying atherosclerotic disease process will progress, limiting the benefit of any interventions.  [name] surveillance will included routine carotid duplex studies which will be completed in: 6 months, at which time the patient will be re-evaluated.   I emphasized the importance of routine surveillance of the carotid arteries as recurrence of stenosis is possible, especially with proper management of underlying atherosclerotic disease. The patient agrees to participate in their maximal medical care and routine surveillance.  Thank you for allowing [place] to participate in this patient's care.  Rosie Golson, Sharmon Leyden, RN, MSN, FNP-C [facility] [place] Office: [phone]  05/05/2015, 12:27 PM  Clinic MD: Oneida Alar

## 2015-05-05 NOTE — Patient Instructions (Signed)
Stroke Prevention Some medical conditions and behaviors are associated with an increased chance of having a stroke. You may prevent a stroke by making healthy choices and managing medical conditions. HOW CAN I REDUCE MY RISK OF HAVING A STROKE?   Stay physically active. Get at least 30 minutes of activity on most or all days.  Do not smoke. It may also be helpful to avoid exposure to secondhand smoke.  Limit alcohol use. Moderate alcohol use is considered to be:  No more than 2 drinks per day for men.  No more than 1 drink per day for nonpregnant women.  Eat healthy foods. This involves:  Eating 5 or more servings of fruits and vegetables a day.  Making dietary changes that address high blood pressure (hypertension), high cholesterol, diabetes, or obesity.  Manage your cholesterol levels.  Making food choices that are high in fiber and low in saturated fat, trans fat, and cholesterol may control cholesterol levels.  Take any prescribed medicines to control cholesterol as directed by your health care provider.  Manage your diabetes.  Controlling your carbohydrate and sugar intake is recommended to manage diabetes.  Take any prescribed medicines to control diabetes as directed by your health care provider.  Control your hypertension.  Making food choices that are low in salt (sodium), saturated fat, trans fat, and cholesterol is recommended to manage hypertension.  Take any prescribed medicines to control hypertension as directed by your health care provider.  Maintain a healthy weight.  Reducing calorie intake and making food choices that are low in sodium, saturated fat, trans fat, and cholesterol are recommended to manage weight.  Stop drug abuse.  Avoid taking birth control pills.  Talk to your health care provider about the risks of taking birth control pills if you are over 35 years old, smoke, get migraines, or have ever had a blood clot.  Get evaluated for sleep  disorders (sleep apnea).  Talk to your health care provider about getting a sleep evaluation if you snore a lot or have excessive sleepiness.  Take medicines only as directed by your health care provider.  For some people, aspirin or blood thinners (anticoagulants) are helpful in reducing the risk of forming abnormal blood clots that can lead to stroke. If you have the irregular heart rhythm of atrial fibrillation, you should be on a blood thinner unless there is a good reason you cannot take them.  Understand all your medicine instructions.  Make sure that other conditions (such as anemia or atherosclerosis) are addressed. SEEK IMMEDIATE MEDICAL CARE IF:   You have sudden weakness or numbness of the face, arm, or leg, especially on one side of the body.  Your face or eyelid droops to one side.  You have sudden confusion.  You have trouble speaking (aphasia) or understanding.  You have sudden trouble seeing in one or both eyes.  You have sudden trouble walking.  You have dizziness.  You have a loss of balance or coordination.  You have a sudden, severe headache with no known cause.  You have new chest pain or an irregular heartbeat. Any of these symptoms may represent a serious problem that is an emergency. Do not wait to see if the symptoms will go away. Get medical help at once. Call your local emergency services (911 in [place] Do not drive yourself to the hospital. Document Released: 10/19/2004 Document Revised: 01/26/2014 Document Reviewed: 03/14/2013 ExitCare Patient Information 2015 [employer]. This information is not intended to replace advice given   to you by your health care provider. Make sure you discuss any questions you have with your health care provider.  

## 2015-05-05 NOTE — Addendum Note (Signed)
Addended by: Dorthula Rue L on: 05/05/2015 01:31 PM   Modules accepted: Orders

## 2015-05-06 ENCOUNTER — Encounter: Payer: No Typology Code available for payment source | Admitting: Vascular Surgery

## 2015-05-12 DIAGNOSIS — I10 Essential (primary) hypertension: Secondary | ICD-10-CM | POA: Diagnosis not present

## 2015-05-12 DIAGNOSIS — Z8673 Personal history of transient ischemic attack (TIA), and cerebral infarction without residual deficits: Secondary | ICD-10-CM | POA: Diagnosis not present

## 2015-05-12 DIAGNOSIS — I6930 Unspecified sequelae of cerebral infarction: Secondary | ICD-10-CM | POA: Diagnosis not present

## 2015-05-12 DIAGNOSIS — E782 Mixed hyperlipidemia: Secondary | ICD-10-CM | POA: Diagnosis not present

## 2015-05-12 DIAGNOSIS — M549 Dorsalgia, unspecified: Secondary | ICD-10-CM | POA: Diagnosis not present

## 2015-06-16 DIAGNOSIS — M7581 Other shoulder lesions, right shoulder: Secondary | ICD-10-CM | POA: Diagnosis not present

## 2015-06-16 DIAGNOSIS — M542 Cervicalgia: Secondary | ICD-10-CM | POA: Diagnosis not present

## 2015-06-30 DIAGNOSIS — I10 Essential (primary) hypertension: Secondary | ICD-10-CM | POA: Diagnosis not present

## 2015-06-30 DIAGNOSIS — G4700 Insomnia, unspecified: Secondary | ICD-10-CM | POA: Diagnosis not present

## 2015-06-30 DIAGNOSIS — Z8673 Personal history of transient ischemic attack (TIA), and cerebral infarction without residual deficits: Secondary | ICD-10-CM | POA: Diagnosis not present

## 2015-06-30 DIAGNOSIS — K449 Diaphragmatic hernia without obstruction or gangrene: Secondary | ICD-10-CM | POA: Diagnosis not present

## 2015-06-30 DIAGNOSIS — E782 Mixed hyperlipidemia: Secondary | ICD-10-CM | POA: Diagnosis not present

## 2015-07-13 DIAGNOSIS — M75111 Incomplete rotator cuff tear or rupture of right shoulder, not specified as traumatic: Secondary | ICD-10-CM | POA: Diagnosis not present

## 2015-07-20 DIAGNOSIS — M25511 Pain in right shoulder: Secondary | ICD-10-CM | POA: Diagnosis not present

## 2015-07-22 DIAGNOSIS — M7581 Other shoulder lesions, right shoulder: Secondary | ICD-10-CM | POA: Diagnosis not present

## 2015-07-26 ENCOUNTER — Ambulatory Visit (INDEPENDENT_AMBULATORY_CARE_PROVIDER_SITE_OTHER): Payer: Self-pay | Admitting: Neurology

## 2015-07-26 ENCOUNTER — Encounter: Payer: Self-pay | Admitting: Neurology

## 2015-07-26 DIAGNOSIS — I10 Essential (primary) hypertension: Secondary | ICD-10-CM

## 2015-07-26 DIAGNOSIS — I6521 Occlusion and stenosis of right carotid artery: Secondary | ICD-10-CM

## 2015-07-26 DIAGNOSIS — I63411 Cerebral infarction due to embolism of right middle cerebral artery: Secondary | ICD-10-CM

## 2015-07-26 DIAGNOSIS — E785 Hyperlipidemia, unspecified: Secondary | ICD-10-CM

## 2015-07-26 DIAGNOSIS — Z9889 Other specified postprocedural states: Secondary | ICD-10-CM | POA: Insufficient documentation

## 2015-07-26 NOTE — Progress Notes (Signed)
Pt came in for 90 day visit of POINT trial. He was admitted on 1/61/09 for right IC/PLIC infarct on MRI, however, CUS showed right 40-59% stenosis and CTA neck showed right distal CCA 60% stenosis. Carotid angio showed > 50% right distal CCA stenosis. Dr. Oneida Alar was consulted and performed right CEA on 04/21/15. Operation found to have > 70% right ICA stenosis as well as a shelf like exophytic plaque that could easily be a source of embolization.  Pt was enrolled to POINT trial before the CUS, CTA and angio study. His IVD was discontinued prior to CEA and restarted after CEA.   During the interval time, he is doing well. No side effect from meds. No recurrent stroke like symptoms. His NIHSS at day 90 was 1 (does not know the month). mRS = 0.   He will continue on ASA 325mg  daily. He is on pravastatin 20mg  now but seems not able to tolerate due to muscle and joint pain. PCP has prescribed Zetia to him. He would like to give pravastatin a try for another week, if by next week, he is still not able to tolerate, then he will switch to Zetia.   He is going to follow up with Dr. Leonie Man in clinic in 2 months after completing the POINT trial.   Rosalin Hawking, MD PhD Stroke Neurology 07/26/2015 9:57 PM

## 2015-09-03 DIAGNOSIS — C44329 Squamous cell carcinoma of skin of other parts of face: Secondary | ICD-10-CM | POA: Diagnosis not present

## 2015-09-03 DIAGNOSIS — C44320 Squamous cell carcinoma of skin of unspecified parts of face: Secondary | ICD-10-CM | POA: Diagnosis not present

## 2015-09-03 DIAGNOSIS — L570 Actinic keratosis: Secondary | ICD-10-CM | POA: Diagnosis not present

## 2015-09-08 DIAGNOSIS — R1013 Epigastric pain: Secondary | ICD-10-CM | POA: Diagnosis not present

## 2015-09-08 DIAGNOSIS — F411 Generalized anxiety disorder: Secondary | ICD-10-CM | POA: Diagnosis not present

## 2015-09-09 ENCOUNTER — Other Ambulatory Visit: Payer: Self-pay | Admitting: Family Medicine

## 2015-09-09 DIAGNOSIS — R1013 Epigastric pain: Secondary | ICD-10-CM

## 2015-09-17 ENCOUNTER — Ambulatory Visit
Admission: RE | Admit: 2015-09-17 | Discharge: 2015-09-17 | Disposition: A | Discharge status: Home / Self Care | Payer: Medicare Other | Source: Ambulatory Visit | Attending: Family Medicine | Admitting: Family Medicine

## 2015-09-17 DIAGNOSIS — R1013 Epigastric pain: Secondary | ICD-10-CM

## 2015-09-29 ENCOUNTER — Ambulatory Visit: Payer: Self-pay | Admitting: Neurology

## 2015-09-30 ENCOUNTER — Other Ambulatory Visit: Payer: Self-pay | Admitting: Gastroenterology

## 2015-09-30 DIAGNOSIS — R1310 Dysphagia, unspecified: Secondary | ICD-10-CM

## 2015-09-30 DIAGNOSIS — R1013 Epigastric pain: Secondary | ICD-10-CM | POA: Diagnosis not present

## 2015-10-07 ENCOUNTER — Ambulatory Visit
Admission: RE | Admit: 2015-10-07 | Discharge: 2015-10-07 | Disposition: A | Discharge status: Home / Self Care | Payer: Medicare Other | Source: Ambulatory Visit | Attending: Gastroenterology | Admitting: Gastroenterology

## 2015-10-07 ENCOUNTER — Other Ambulatory Visit: Payer: Medicare Other

## 2015-10-07 DIAGNOSIS — K224 Dyskinesia of esophagus: Secondary | ICD-10-CM | POA: Diagnosis not present

## 2015-10-07 DIAGNOSIS — R1310 Dysphagia, unspecified: Secondary | ICD-10-CM

## 2015-10-07 DIAGNOSIS — R1013 Epigastric pain: Secondary | ICD-10-CM

## 2015-10-13 DIAGNOSIS — F411 Generalized anxiety disorder: Secondary | ICD-10-CM | POA: Diagnosis not present

## 2015-10-13 DIAGNOSIS — G4700 Insomnia, unspecified: Secondary | ICD-10-CM | POA: Diagnosis not present

## 2015-10-18 DIAGNOSIS — C44329 Squamous cell carcinoma of skin of other parts of face: Secondary | ICD-10-CM | POA: Diagnosis not present

## 2015-11-04 ENCOUNTER — Encounter: Payer: Self-pay | Admitting: Family

## 2015-11-10 DIAGNOSIS — K219 Gastro-esophageal reflux disease without esophagitis: Secondary | ICD-10-CM | POA: Diagnosis not present

## 2015-11-10 DIAGNOSIS — E782 Mixed hyperlipidemia: Secondary | ICD-10-CM | POA: Diagnosis not present

## 2015-11-10 DIAGNOSIS — G4700 Insomnia, unspecified: Secondary | ICD-10-CM | POA: Diagnosis not present

## 2015-11-10 DIAGNOSIS — R0600 Dyspnea, unspecified: Secondary | ICD-10-CM | POA: Diagnosis not present

## 2015-11-10 DIAGNOSIS — I10 Essential (primary) hypertension: Secondary | ICD-10-CM | POA: Diagnosis not present

## 2015-11-10 DIAGNOSIS — F411 Generalized anxiety disorder: Secondary | ICD-10-CM | POA: Diagnosis not present

## 2015-11-10 DIAGNOSIS — I700 Atherosclerosis of aorta: Secondary | ICD-10-CM | POA: Diagnosis not present

## 2015-11-10 DIAGNOSIS — Z8673 Personal history of transient ischemic attack (TIA), and cerebral infarction without residual deficits: Secondary | ICD-10-CM | POA: Diagnosis not present

## 2015-11-11 ENCOUNTER — Ambulatory Visit: Payer: Medicare Other | Admitting: Family

## 2015-11-11 ENCOUNTER — Encounter (HOSPITAL_COMMUNITY): Payer: Medicare Other

## 2015-12-06 ENCOUNTER — Encounter: Payer: Self-pay | Admitting: Family

## 2015-12-09 ENCOUNTER — Ambulatory Visit (INDEPENDENT_AMBULATORY_CARE_PROVIDER_SITE_OTHER): Payer: Medicare Other | Admitting: Family

## 2015-12-09 ENCOUNTER — Ambulatory Visit (HOSPITAL_COMMUNITY)
Admission: RE | Admit: 2015-12-09 | Discharge: 2015-12-09 | Disposition: A | Discharge status: Home / Self Care | Payer: Medicare Other | Source: Ambulatory Visit | Attending: Family | Admitting: Family

## 2015-12-09 ENCOUNTER — Encounter: Payer: Self-pay | Admitting: Family

## 2015-12-09 VITALS — BP 124/78 | HR 99 | Temp 97.4°F | Resp 16 | Ht 70.0 in | Wt 195.0 lb

## 2015-12-09 DIAGNOSIS — Z8673 Personal history of transient ischemic attack (TIA), and cerebral infarction without residual deficits: Secondary | ICD-10-CM | POA: Insufficient documentation

## 2015-12-09 DIAGNOSIS — I10 Essential (primary) hypertension: Secondary | ICD-10-CM | POA: Insufficient documentation

## 2015-12-09 DIAGNOSIS — I6521 Occlusion and stenosis of right carotid artery: Secondary | ICD-10-CM | POA: Diagnosis not present

## 2015-12-09 DIAGNOSIS — Z9889 Other specified postprocedural states: Secondary | ICD-10-CM

## 2015-12-09 DIAGNOSIS — Z48812 Encounter for surgical aftercare following surgery on the circulatory system: Secondary | ICD-10-CM | POA: Insufficient documentation

## 2015-12-09 DIAGNOSIS — I739 Peripheral vascular disease, unspecified: Secondary | ICD-10-CM | POA: Insufficient documentation

## 2015-12-09 DIAGNOSIS — K219 Gastro-esophageal reflux disease without esophagitis: Secondary | ICD-10-CM | POA: Diagnosis not present

## 2015-12-09 DIAGNOSIS — I779 Disorder of arteries and arterioles, unspecified: Secondary | ICD-10-CM | POA: Insufficient documentation

## 2015-12-09 NOTE — Progress Notes (Signed)
Chief Complaint: Extracranial Carotid Artery Stenosis   History of Present Illness  Trevor Ward is a 75 y.o. male patient of Dr. Oneida Alar who presents for routine surveillance s/p right CEA (Date: 04/21/15). [name] neck incision is well healed with minimal swelling.  He has not had any post operative TIA or stroke symptoms.  He had a pre-opererative stoke on 04/11/15 as manifested by left facial drooping and staggering. He was evaluated that evening at [facility] per wife. Wife states that MRI of his brain shows he had a small stroke. He denies any subsequent strokes.  He no longer staggers and  no longer has the daily headache that he had before the CEA.  He denies any history of amaurosis fugax, hemiparesis, or speech difficulties.   He had a non melanoma skin cancer removed from his right temple recently.  Pt Diabetic: no Pt smoker: non smoker  Pt meds include: Statin : no, he had severe myalgias when he took a statin ASA: yes Other anticoagulants/antiplatelets: no   Past Medical History  Diagnosis Date  . Hypertension   . Stroke ([place])   . Peripheral vascular disease ([place])     carotid artery stenosis  . History of hiatal hernia   . Depression   . GERD (gastroesophageal reflux disease)   . Arthritis   . Cancer ([place])     skin cancer - on face "the good kind"  . Gout     Social History Social History  Substance Use Topics  . Smoking status: Former Research scientist (life sciences)  . Smokeless tobacco: Never Used  . Alcohol Use: No    Family History Family History  Problem Relation Age of Onset  . CAD Mother   . CVA Mother   . Diabetes Mother   . Breast cancer Mother   . Parkinson's disease Mother   . Arthritis Mother   . Heart attack Father   . CVA Sister   . CAD Brother   . Diabetes Brother   . CVA Brother   . CAD Brother   . Diabetes Brother     Surgical History Past Surgical History  Procedure Laterality Date  . Peripheral vascular catheterization Bilateral  04/14/2015    Procedure: Carotid Angiography;  Surgeon: Serafina Mitchell, MD;  Location: [place] [facility];  Service: Cardiovascular;  Laterality: Bilateral;  . Knee arthroscopy Right   . Colonoscopy    . Endarterectomy Right 04/21/2015    Procedure: RIGHT CAROTID ENDARTERECTOMY;  Surgeon: Elam Dutch, MD;  Location: [place];  Service: Vascular;  Laterality: Right;    Allergies  Allergen Reactions  . Statins Other (See Comments)    Severe myalgias    Current Outpatient Prescriptions  Medication Sig Dispense Refill  . aspirin 81 MG tablet Take 81 mg by mouth daily.    Marland Kitchen allopurinol (ZYLOPRIM) 100 MG tablet Take 100 mg by mouth 2 (two) times daily.    Marland Kitchen esomeprazole (NEXIUM) 40 MG capsule Take 40 mg by mouth daily at 12 noon.    Marland Kitchen lisinopril (PRINIVIL,ZESTRIL) 10 MG tablet Take 10 mg by mouth daily.     No current facility-administered medications for this visit.    Review of Systems : See HPI for pertinent positives and negatives.  Physical Examination  Filed Vitals:   12/09/15 1512 12/09/15 1516  BP: 137/78 124/78  Pulse: 92 99  Temp: 97.4 F (36.3 C)   TempSrc: Oral   Resp: 16   Height: 5\' 10"  (1.778 m)  Weight: 195 lb (88.451 kg)   SpO2: 97%    Body mass index is 27.98 kg/(m^2).  General: WDWN male in NAD GAIT: normal Eyes: PERRLA Pulmonary:  Non-labored respirations, CTAB Cardiac: regular rhythm,  no detected murmur.  VASCULAR EXAM Carotid Bruits Right Left   Negative Negative    Aorta is not palpable. Radial pulses are 2+ palpable and equal.                                                                                                                            LE Pulses Right Left       POPLITEAL  not palpable   not palpable       POSTERIOR TIBIAL   palpable    palpable        DORSALIS PEDIS      ANTERIOR TIBIAL  palpable   palpable     Gastrointestinal: soft, nontender, BS WNL, no r/g,  no palpable masses.  Musculoskeletal: no muscle  atrophy/wasting. M/S 5/5 throughout, extremities without ischemic changes  Neurologic: A&O X 3; Appropriate Affect, Speech is normal CN 2-12 intact except is hard of hearing, pain and light touch intact in extremities, Motor exam as listed above.   Non-Invasive Vascular Imaging CAROTID DUPLEX 12/09/2015   Right ICA: CEA site with no restenosis. Left ICA: <40% stenosis.    Assessment: Trevor Ward is a 75 y.o. male who is s/p right CEA on 04/21/15. He had a pre-opererative stoke on 04/11/15 as manifested by left facial drooping and staggering. He was evaluated that evening at [facility] per wife. Wife states that MRI of his brain shows he had a small stroke. He denies any subsequent strokes.  He no longer staggers and  no longer has the daily headache that he had before the CEA.  Today's carotid duplex suggests right CEA site with no restenosis and <40% stenosis of the left ICA.  Fortunately he does not have DM and never used tobacco. He is statin intolerant and takes a daily ASA.   Plan: Follow-up in 6 months with Carotid Duplex scan according to post operative CEA surveillance timeline.    I discussed in depth with the patient the nature of atherosclerosis, and emphasized the importance of maximal medical management including strict control of blood pressure, blood glucose, and lipid levels, obtaining regular exercise, and continued cessation of smoking.  The patient is aware that without maximal medical management the underlying atherosclerotic disease process will progress, limiting the benefit of any interventions. The patient was given information about stroke prevention and what symptoms should prompt the patient to seek immediate medical care. Thank you for allowing [place] to participate in this patient's care.  Clemon Chambers, RN, MSN, FNP-C [facility] [place] Office: [phone]  Clinic Physician: Scot Dock  12/09/2015 3:35 PM

## 2015-12-09 NOTE — Patient Instructions (Signed)
Stroke Prevention Some medical conditions and behaviors are associated with an increased chance of having a stroke. You may prevent a stroke by making healthy choices and managing medical conditions. HOW CAN I REDUCE MY RISK OF HAVING A STROKE?   Stay physically active. Get at least 30 minutes of activity on most or all days.  Do not smoke. It may also be helpful to avoid exposure to secondhand smoke.  Limit alcohol use. Moderate alcohol use is considered to be:  No more than 2 drinks per day for men.  No more than 1 drink per day for nonpregnant women.  Eat healthy foods. This involves:  Eating 5 or more servings of fruits and vegetables a day.  Making dietary changes that address high blood pressure (hypertension), high cholesterol, diabetes, or obesity.  Manage your cholesterol levels.  Making food choices that are high in fiber and low in saturated fat, trans fat, and cholesterol may control cholesterol levels.  Take any prescribed medicines to control cholesterol as directed by your health care provider.  Manage your diabetes.  Controlling your carbohydrate and sugar intake is recommended to manage diabetes.  Take any prescribed medicines to control diabetes as directed by your health care provider.  Control your hypertension.  Making food choices that are low in salt (sodium), saturated fat, trans fat, and cholesterol is recommended to manage hypertension.  Ask your health care provider if you need treatment to lower your blood pressure. Take any prescribed medicines to control hypertension as directed by your health care provider.  If you are 18-39 years of age, have your blood pressure checked every 3-5 years. If you are 40 years of age or older, have your blood pressure checked every year.  Maintain a healthy weight.  Reducing calorie intake and making food choices that are low in sodium, saturated fat, trans fat, and cholesterol are recommended to manage  weight.  Stop drug abuse.  Avoid taking birth control pills.  Talk to your health care provider about the risks of taking birth control pills if you are over 35 years old, smoke, get migraines, or have ever had a blood clot.  Get evaluated for sleep disorders (sleep apnea).  Talk to your health care provider about getting a sleep evaluation if you snore a lot or have excessive sleepiness.  Take medicines only as directed by your health care provider.  For some people, aspirin or blood thinners (anticoagulants) are helpful in reducing the risk of forming abnormal blood clots that can lead to stroke. If you have the irregular heart rhythm of atrial fibrillation, you should be on a blood thinner unless there is a good reason you cannot take them.  Understand all your medicine instructions.  Make sure that other conditions (such as anemia or atherosclerosis) are addressed. SEEK IMMEDIATE MEDICAL CARE IF:   You have sudden weakness or numbness of the face, arm, or leg, especially on one side of the body.  Your face or eyelid droops to one side.  You have sudden confusion.  You have trouble speaking (aphasia) or understanding.  You have sudden trouble seeing in one or both eyes.  You have sudden trouble walking.  You have dizziness.  You have a loss of balance or coordination.  You have a sudden, severe headache with no known cause.  You have new chest pain or an irregular heartbeat. Any of these symptoms may represent a serious problem that is an emergency. Do not wait to see if the symptoms will   go away. Get medical help at once. Call your local emergency services (911 in [place] Do not drive yourself to the hospital.   This information is not intended to replace advice given to you by your health care provider. Make sure you discuss any questions you have with your health care provider.   Document Released: 10/19/2004 Document Revised: 10/02/2014 Document Reviewed:  03/14/2013 Elsevier Interactive Patient Education 2016 Elsevier Inc.  

## 2015-12-10 NOTE — Addendum Note (Signed)
Addended by: Thresa Ross C on: 12/10/2015 11:41 AM   Modules accepted: Orders

## 2015-12-29 DIAGNOSIS — R1013 Epigastric pain: Secondary | ICD-10-CM | POA: Diagnosis not present

## 2015-12-29 DIAGNOSIS — K921 Melena: Secondary | ICD-10-CM | POA: Diagnosis not present

## 2015-12-31 ENCOUNTER — Other Ambulatory Visit: Payer: Self-pay | Admitting: Gastroenterology

## 2016-01-03 ENCOUNTER — Encounter (HOSPITAL_COMMUNITY): Payer: Self-pay | Admitting: *Deleted

## 2016-01-03 ENCOUNTER — Ambulatory Visit (HOSPITAL_COMMUNITY)
Admission: RE | Admit: 2016-01-03 | Discharge: 2016-01-03 | Disposition: A | Discharge status: Home / Self Care | Payer: Medicare Other | Source: Ambulatory Visit | Attending: Gastroenterology | Admitting: Gastroenterology

## 2016-01-03 ENCOUNTER — Encounter (HOSPITAL_COMMUNITY)
Admission: RE | Disposition: A | Discharge status: Home / Self Care | Payer: Self-pay | Source: Ambulatory Visit | Attending: Gastroenterology

## 2016-01-03 ENCOUNTER — Ambulatory Visit (HOSPITAL_COMMUNITY): Payer: Medicare Other | Admitting: Certified Registered Nurse Anesthetist

## 2016-01-03 DIAGNOSIS — K449 Diaphragmatic hernia without obstruction or gangrene: Secondary | ICD-10-CM | POA: Diagnosis not present

## 2016-01-03 DIAGNOSIS — R195 Other fecal abnormalities: Secondary | ICD-10-CM | POA: Insufficient documentation

## 2016-01-03 DIAGNOSIS — Z7982 Long term (current) use of aspirin: Secondary | ICD-10-CM | POA: Insufficient documentation

## 2016-01-03 DIAGNOSIS — K921 Melena: Secondary | ICD-10-CM | POA: Diagnosis not present

## 2016-01-03 DIAGNOSIS — I10 Essential (primary) hypertension: Secondary | ICD-10-CM | POA: Insufficient documentation

## 2016-01-03 DIAGNOSIS — Z87891 Personal history of nicotine dependence: Secondary | ICD-10-CM | POA: Diagnosis not present

## 2016-01-03 DIAGNOSIS — R1013 Epigastric pain: Secondary | ICD-10-CM | POA: Diagnosis not present

## 2016-01-03 DIAGNOSIS — Z8673 Personal history of transient ischemic attack (TIA), and cerebral infarction without residual deficits: Secondary | ICD-10-CM | POA: Insufficient documentation

## 2016-01-03 DIAGNOSIS — K2950 Unspecified chronic gastritis without bleeding: Secondary | ICD-10-CM | POA: Insufficient documentation

## 2016-01-03 DIAGNOSIS — R1310 Dysphagia, unspecified: Secondary | ICD-10-CM | POA: Diagnosis not present

## 2016-01-03 HISTORY — PX: ESOPHAGOGASTRODUODENOSCOPY (EGD) WITH PROPOFOL: SHX5813

## 2016-01-03 SURGERY — ESOPHAGOGASTRODUODENOSCOPY (EGD) WITH PROPOFOL
Anesthesia: Monitor Anesthesia Care

## 2016-01-03 MED ORDER — PROPOFOL 500 MG/50ML IV EMUL
INTRAVENOUS | Status: DC | PRN
  Administered 2016-01-03: 75 ug/kg/min via INTRAVENOUS

## 2016-01-03 MED ORDER — PROPOFOL 10 MG/ML IV BOLUS
INTRAVENOUS | Status: AC
Start: 2016-01-03 — End: 2016-01-03
  Filled 2016-01-03: qty 60

## 2016-01-03 MED ORDER — PROPOFOL 10 MG/ML IV BOLUS
INTRAVENOUS | Status: DC | PRN
  Administered 2016-01-03: 10 mg via INTRAVENOUS
  Administered 2016-01-03: 20 mg via INTRAVENOUS

## 2016-01-03 MED ORDER — LACTATED RINGERS IV SOLN
INTRAVENOUS | Status: DC
  Administered 2016-01-03: 1000 mL via INTRAVENOUS

## 2016-01-03 MED ORDER — ONDANSETRON HCL 4 MG/2ML IJ SOLN
INTRAMUSCULAR | Status: DC | PRN
  Administered 2016-01-03: 4 mg via INTRAVENOUS

## 2016-01-03 MED ORDER — ONDANSETRON HCL 4 MG/2ML IJ SOLN
INTRAMUSCULAR | Status: AC
  Filled 2016-01-03: qty 2

## 2016-01-03 MED ORDER — LIDOCAINE HCL (CARDIAC) 20 MG/ML IV SOLN
INTRAVENOUS | Status: DC | PRN
  Administered 2016-01-03: 100 mg via INTRAVENOUS

## 2016-01-03 MED ORDER — LIDOCAINE HCL (CARDIAC) 20 MG/ML IV SOLN
INTRAVENOUS | Status: AC
  Filled 2016-01-03: qty 5

## 2016-01-03 SURGICAL SUPPLY — 14 items

## 2016-01-03 NOTE — Op Note (Signed)
[facility] Patient Name: Trevor Ward Procedure Date: 01/03/2016 MRN: [id number] Attending MD: Garlan Fair , MD Date of Birth: Jun 19, 1941 CSN:  Age: 75 Admit Type: Outpatient Procedure:                Upper GI endoscopy Indications:              Epigastric abdominal pain Providers:                Garlan Fair, MD, Laverta Baltimore, RN, Cherylynn Ridges, Technician, Adair Laundry, CRNA Referring MD:              Medicines:                Propofol per Anesthesia Complications:            No immediate complications. Estimated Blood Loss:     Estimated blood loss: none. Procedure:                Pre-Anesthesia Assessment:                           - Prior to the procedure, a History and Physical                            was performed, and patient medications and                            allergies were reviewed. [name] tolerance of                            previous anesthesia was also reviewed. The risks                            and benefits of the procedure and the sedation                            options and risks were discussed with the patient.                            All questions were answered, and informed consent                            was obtained. Prior Anticoagulants: The patient has                            taken aspirin, last dose was day of procedure. ASA                            Grade Assessment: III - A patient with severe                            systemic disease. After reviewing the risks and  benefits, the patient was deemed in satisfactory                            condition to undergo the procedure.                           After obtaining informed consent, the endoscope was                            passed under direct vision. Throughout the                            procedure, the patient's blood pressure, pulse, and                            oxygen  saturations were monitored continuously. The                            [id number]) scope was introduced through the                            mouth, and advanced to the second part of duodenum.                            The upper GI endoscopy was accomplished without                            difficulty. The patient tolerated the procedure                            well. Scope In: Scope Out: Findings:      The Z-line was regular and was found 42 cm from the incisors.      The examined esophagus was normal.      The entire examined stomach was normal. Biopsies were taken with a cold       forceps for histology.      The examined duodenum was normal. Impression:               - Z-line regular, 42 cm from the incisors.                           - Normal esophagus.                           - Normal stomach. Biopsied.                           - Normal examined duodenum. Moderate Sedation:      N/A- Per Anesthesia Care Recommendation:           - Patient has a contact number available for                            emergencies. The signs and symptoms of potential  delayed complications were discussed with the                            patient. Return to normal activities tomorrow.                            Written discharge instructions were provided to the                            patient.                           - Await pathology results.                           - Await pathology results.                           - Resume previous diet.                           - Continue present medications. Procedure Code(s):        --- Professional ---                           [phone], Esophagogastroduodenoscopy, flexible,                            transoral; with biopsy, single or multiple Diagnosis Code(s):        --- Professional ---                           R10.13, Epigastric pain CPT copyright 2016 American Medical Association. All rights  reserved. The codes documented in this report are preliminary and upon coder review may  be revised to meet current compliance requirements. Earle Gell, MD Garlan Fair, MD 01/03/2016 8:47:37 AM This report has been signed electronically. Number of Addenda: 0

## 2016-01-03 NOTE — Anesthesia Preprocedure Evaluation (Signed)
Anesthesia Evaluation  Patient identified by MRN, date of birth, ID band Patient awake    Reviewed: Allergy & Precautions, NPO status , Patient's Chart, lab work & pertinent test results  Airway Mallampati: II  TM Distance: >3 FB Neck ROM: Full    Dental no notable dental hx. (+) Edentulous Upper, Edentulous Lower   Pulmonary former smoker,    Pulmonary exam normal breath sounds clear to auscultation       Cardiovascular hypertension, Pt. on medications Normal cardiovascular exam Rhythm:Regular Rate:Normal     Neuro/Psych CVA negative neurological ROS  negative psych ROS   GI/Hepatic Neg liver ROS, hiatal hernia, GERD  ,  Endo/Other  negative endocrine ROS  Renal/GU negative Renal ROS  negative genitourinary   Musculoskeletal negative musculoskeletal ROS (+)   Abdominal   Peds negative pediatric ROS (+)  Hematology negative hematology ROS (+)   Anesthesia Other Findings   Reproductive/Obstetrics negative OB ROS                             Anesthesia Physical Anesthesia Plan  ASA: III  Anesthesia Plan: MAC   Post-op Pain Management:    Induction: Intravenous  Airway Management Planned: Simple Face Mask and Nasal Cannula  Additional Equipment:   Intra-op Plan:   Post-operative Plan:   Informed Consent: I have reviewed the patients History and Physical, chart, labs and discussed the procedure including the risks, benefits and alternatives for the proposed anesthesia with the patient or authorized representative who has indicated his/her understanding and acceptance.   Dental advisory given  Plan Discussed with: CRNA  Anesthesia Plan Comments:         Anesthesia Quick Evaluation

## 2016-01-03 NOTE — H&P (Signed)
  Problem: Epigastric abdominal pain. 09/30/2015 area esophagram with upper GI x-ray series showed duodenal diverticula and thickened folds in the gastric fundus. 12/29/2015 hemoglobin was 15.3 g. 09/09/2015 abdominal ultrasound showed aortic atherosclerosis.  History: The patient is a 75 year old male born [birth year]. He takes aspirin on a daily basis. He has been passing dark stool. Barium upper GI x-ray series showed gastric fold thickening in the gastric fundus.  He is scheduled to undergo diagnostic esophagogastroduodenoscopy  Past medical history: Gastroesophageal reflux. Gout. Hypertension. Insomnia. Lacunar stroke in August 2015. Right knee arthroscopic surgery.  Medication allergies: Simvastatin causes muscle aches.  Exam: The patient is alert and lying comfortably on the endoscopy stretcher. Abdomen is soft and nontender to palpation. Lungs are clear to auscultation. Cardiac exam reveals a regular rhythm.  Plan: Proceed with diagnostic esophagogastroduodenoscopy.

## 2016-01-03 NOTE — Discharge Instructions (Signed)
Monitored Anesthesia Care °Monitored anesthesia care is an anesthesia service for a medical procedure. Anesthesia is the loss of the ability to feel pain. It is produced by medicines called anesthetics. It may affect a small area of your body (local anesthesia), a large area of your body (regional anesthesia), or your entire body (general anesthesia). The need for monitored anesthesia care depends your procedure, your condition, and the potential need for regional or general anesthesia. It is often provided during procedures where:  °· General anesthesia may be needed if there are complications. This is because you need special care when you are under general anesthesia.   °· You will be under local or regional anesthesia. This is so that you are able to have higher levels of anesthesia if needed.   °· You will receive calming medicines (sedatives). This is especially the case if sedatives are given to put you in a semi-conscious state of relaxation (deep sedation). This is because the amount of sedative needed to produce this state can be hard to predict. Too much of a sedative can produce general anesthesia. °Monitored anesthesia care is performed by one or more health care providers who have special training in all types of anesthesia. You will need to meet with these health care providers before your procedure. During this meeting, they will ask you about your medical history. They will also give you instructions to follow. (For example, you will need to stop eating and drinking before your procedure. You may also need to stop or change medicines you are taking.) During your procedure, your health care providers will stay with you. They will:  °· Watch your condition. This includes watching your blood pressure, breathing, and level of pain.   °· Diagnose and treat problems that occur.   °· Give medicines if they are needed. These may include calming medicines (sedatives) and anesthetics.   °· Make sure you are  comfortable.   °Having monitored anesthesia care does not necessarily mean that you will be under anesthesia. It does mean that your health care providers will be able to manage anesthesia if you need it or if it occurs. It also means that you will be able to have a different type of anesthesia than you are having if you need it. When your procedure is complete, your health care providers will continue to watch your condition. They will make sure any medicines wear off before you are allowed to go home.  °  °This information is not intended to replace advice given to you by your health care provider. Make sure you discuss any questions you have with your health care provider. °  °Document Released: 06/07/2005 Document Revised: 10/02/2014 Document Reviewed: 10/23/2012 °Elsevier Interactive Patient Education ©2016 [employer] ° ° °Esophagogastroduodenoscopy, Care After °Refer to this sheet in the next few weeks. These instructions provide you with information about caring for yourself after your procedure. Your health care provider may also give you more specific instructions. Your treatment has been planned according to current medical practices, but problems sometimes occur. Call your health care provider if you have any problems or questions after your procedure. °WHAT TO EXPECT AFTER THE PROCEDURE °After your procedure, it is typical to feel: °· Soreness in your throat. °· Pain with swallowing. °· Sick to your stomach (nauseous). °· Bloated. °· Dizzy. °· Fatigued. °HOME CARE INSTRUCTIONS °· Do not eat or drink anything until the numbing medicine (local anesthetic) has worn off and your gag reflex has returned. You will know that the local anesthetic has   worn off when you can swallow comfortably. °· Do not drive or operate machinery until directed by your health care provider. °· Take medicines only as directed by your health care provider. °SEEK MEDICAL CARE IF:  °· You cannot stop coughing. °· You are not  urinating at all or less than usual. °SEEK IMMEDIATE MEDICAL CARE IF: °· You have difficulty swallowing. °· You cannot eat or drink. °· You have worsening throat or chest pain. °· You have dizziness or lightheadedness or you faint. °· You have nausea or vomiting. °· You have chills. °· You have a fever. °· You have severe abdominal pain. °· You have black, tarry, or bloody stools. °  °This information is not intended to replace advice given to you by your health care provider. Make sure you discuss any questions you have with your health care provider. °  °Document Released: 08/28/2012 Document Revised: 10/02/2014 Document Reviewed: 08/28/2012 °Elsevier Interactive Patient Education ©2016 [employer] ° °

## 2016-01-03 NOTE — Anesthesia Postprocedure Evaluation (Signed)
Anesthesia Post Note  Patient: Trevor Ward  Procedure(s) Performed: Procedure(s) (LRB): ESOPHAGOGASTRODUODENOSCOPY (EGD) WITH PROPOFOL (N/A)  Patient location during evaluation: Endoscopy Anesthesia Type: MAC Level of consciousness: awake and alert Pain management: pain level controlled Vital Signs Assessment: post-procedure vital signs reviewed and stable Respiratory status: spontaneous breathing, nonlabored ventilation, respiratory function stable and patient connected to nasal cannula oxygen Cardiovascular status: stable and blood pressure returned to baseline Anesthetic complications: no    Last Vitals:  Filed Vitals:   01/03/16 0910 01/03/16 0920  BP: 119/85 132/80  Pulse: 72 70  Temp:    Resp: 22 19    Last Pain: There were no vitals filed for this visit.               Montez Hageman

## 2016-01-03 NOTE — Transfer of Care (Signed)
Immediate Anesthesia Transfer of Care Note  Patient: Trevor Ward  Procedure(s) Performed: Procedure(s): ESOPHAGOGASTRODUODENOSCOPY (EGD) WITH PROPOFOL (N/A)  Patient Location: ENDO  Anesthesia Type:MAC  Level of Consciousness:  sedated, patient cooperative and responds to stimulation  Airway & Oxygen Therapy:Patient Spontanous Breathing and Patient connected to face mask oxgen  Post-op Assessment:  Report given to ENDO RN and Post -op Vital signs reviewed and stable  Post vital signs:  Reviewed and stable  Last Vitals:  Filed Vitals:   01/03/16 0806  BP: 150/77  Pulse: 79  Temp: 36.4 C  Resp: 13    Complications: No apparent anesthesia complications

## 2016-01-05 ENCOUNTER — Encounter (HOSPITAL_COMMUNITY): Payer: Self-pay | Admitting: Gastroenterology

## 2016-01-12 ENCOUNTER — Telehealth: Payer: Self-pay | Admitting: Cardiovascular Disease

## 2016-01-12 DIAGNOSIS — R1013 Epigastric pain: Secondary | ICD-10-CM | POA: Diagnosis not present

## 2016-01-12 DIAGNOSIS — I209 Angina pectoris, unspecified: Secondary | ICD-10-CM | POA: Diagnosis not present

## 2016-01-12 NOTE — Telephone Encounter (Signed)
Received records from [facility] for appointment on 01/28/16 with Dr Sallyanne Kuster.  Records given to [facility] (medical records) for Dr Croitoru's schedule on 01/28/16.

## 2016-01-24 HISTORY — PX: OTHER SURGICAL HISTORY: SHX169

## 2016-01-28 ENCOUNTER — Ambulatory Visit (INDEPENDENT_AMBULATORY_CARE_PROVIDER_SITE_OTHER): Payer: Medicare Other | Admitting: Cardiovascular Disease

## 2016-01-28 ENCOUNTER — Encounter: Payer: Self-pay | Admitting: Cardiovascular Disease

## 2016-01-28 VITALS — BP 124/76 | HR 94 | Ht 68.0 in | Wt 199.1 lb

## 2016-01-28 DIAGNOSIS — R5383 Other fatigue: Secondary | ICD-10-CM

## 2016-01-28 DIAGNOSIS — Z01812 Encounter for preprocedural laboratory examination: Secondary | ICD-10-CM

## 2016-01-28 DIAGNOSIS — D689 Coagulation defect, unspecified: Secondary | ICD-10-CM

## 2016-01-28 DIAGNOSIS — I700 Atherosclerosis of aorta: Secondary | ICD-10-CM

## 2016-01-28 DIAGNOSIS — Z9889 Other specified postprocedural states: Secondary | ICD-10-CM

## 2016-01-28 DIAGNOSIS — R0609 Other forms of dyspnea: Secondary | ICD-10-CM

## 2016-01-28 DIAGNOSIS — I2510 Atherosclerotic heart disease of native coronary artery without angina pectoris: Secondary | ICD-10-CM

## 2016-01-28 DIAGNOSIS — R079 Chest pain, unspecified: Secondary | ICD-10-CM

## 2016-01-28 DIAGNOSIS — R0600 Dyspnea, unspecified: Secondary | ICD-10-CM

## 2016-01-28 NOTE — Progress Notes (Signed)
Patient ID: Trevor Ward, male   DOB: [birth year], 75 y.o.   MRN: [id number]    Cardiology Office Note    Date:  01/28/2016   ID:  Trevor Ward, DOB [birth year], MRN [id number]  PCP:  Mayra Neer, MD  Cardiologist:   Sanda Klein, MD   Chief Complaint  Patient presents with  . New Evaluation    Mild exertional chest pain, occassional shortness of breath, no edema in legs, no pain in legs,no lightheaded or dizziness    History of Present Illness:  Trevor Ward is a 75 y.o. male with a history of previous small stroke and carotid artery stenosis status post urgent right carotid endarterectomy in July 2016.   He is referred for chest pain. He initially denies any chest pain complaints and states that he is here to discuss shortness of breath. He gives a rambling history, but eventually it becomes apparent that during moderate physical activity he develops both chest discomfort and shortness of breath. Taking out food to his dog-pen triggers both dyspnea and a burning retrosternal sensation. He attributes this to his hiatal hernia. He returns to the house, sits down in a chair, drinks a glass of cold milk and has gradual relief.  He was complaining of epigastric pain which improved when his dose of aspirin was decreased from 325 down to 81 mg daily. Abdominal ultrasonography showed prominent aortic atherosclerosis, no GI abnormalities. Barium swallow was fairly unremarkable except for gastric fold thickening in the fundus. He subsequently had an EGD that did not show significant abnormalities. He also describes coughing that occurs every time he lays down and resolves after about 10 minutes. He sleeps with the head of the bed elevated on a brick due to gastroesophageal reflux disease.  He has a history of hypertension, but currently is not taking any medications for his blood pressure and has normal blood pressure readings. He is not taking a statin. Previous chest CT shows abundant  aortic atherosclerosis as well as calcification in the distribution of the coronary arteries. Fasting glucose is borderline at 109, he has normal renal function, his LDL cholesterol is elevated at 127 and he has a markedly low HDL cholesterol of 24. Echocardiography in 2016 showed normal left ventricular systolic function, EF [id number] percent, moderate left ventricular hypertrophy and mild grade 1 diastolic dysfunction. Mention is made of aortic valve sclerosis and mild aortic regurgitation.    Past Medical History  Diagnosis Date  . Hypertension   . Stroke ([place])   . Peripheral vascular disease ([place])     carotid artery stenosis  . History of hiatal hernia   . Depression   . GERD (gastroesophageal reflux disease)   . Arthritis   . Cancer ([place])     skin cancer - on face "the good kind"  . Gout     Past Surgical History  Procedure Laterality Date  . Peripheral vascular catheterization Bilateral 04/14/2015    Procedure: Carotid Angiography;  Surgeon: Serafina Mitchell, MD;  Location: [facility];  Service: Cardiovascular;  Laterality: Bilateral;  . Knee arthroscopy Right   . Colonoscopy    . Endarterectomy Right 04/21/2015    Procedure: RIGHT CAROTID ENDARTERECTOMY;  Surgeon: Elam Dutch, MD;  Location: [facility];  Service: Vascular;  Laterality: Right;  . Esophagogastroduodenoscopy (egd) with propofol N/A 01/03/2016    Procedure: ESOPHAGOGASTRODUODENOSCOPY (EGD) WITH PROPOFOL;  Surgeon: Garlan Fair, MD;  Location: [facility];  Service: Endoscopy;  Laterality: N/A;    Current  Medications: Outpatient Prescriptions Prior to Visit  Medication Sig Dispense Refill  . aspirin 81 MG tablet Take 81 mg by mouth daily.    Marland Kitchen esomeprazole (NEXIUM) 40 MG capsule Take 40 mg by mouth daily at 12 noon.    Marland Kitchen allopurinol (ZYLOPRIM) 100 MG tablet Take 100 mg by mouth 2 (two) times daily.    Marland Kitchen lisinopril (PRINIVIL,ZESTRIL) 10 MG tablet Take 10 mg by mouth daily.     No facility-administered  medications prior to visit.     Allergies:   Statins   Social History   Social History  . Marital Status: Married    Spouse Name: N/A  . Number of Children: N/A  . Years of Education: N/A   Social History Main Topics  . Smoking status: Former Smoker    Start date: 01/28/1956  . Smokeless tobacco: Never Used  . Alcohol Use: No  . Drug Use: No  . Sexual Activity: Not Asked   Other Topics Concern  . None   Social History Narrative   Epworth Sleepiness Scale = 16 (as of 01/28/16)     Family History:  [name] family history includes Arthritis in his mother; Breast cancer in his mother; CAD in his brother, brother, and mother; CVA in his brother, mother, and sister; Diabetes in his brother, brother, and mother; Heart attack in his father; Parkinson's disease in his mother.   ROS:   Please see the history of present illness.    ROS All other systems reviewed and are negative.   PHYSICAL EXAM:   VS:  BP 124/76 mmHg  Pulse 94  Ht 5\' 8"  (1.727 m)  Wt 90.323 kg (199 lb 2 oz)  BMI 30.28 kg/m2   GEN: Well nourished, well developed, in no acute distress HEENT: normal Neck: no JVD, carotid bruits, or masses. Healed right carotid endarterectomy scar Cardiac: RRR; no murmurs, rubs, or gallops,no edema  Respiratory:  clear to auscultation bilaterally, normal work of breathing GI: soft, nontender, nondistended, + BS MS: no deformity or atrophy Skin: warm and dry, no rash Neuro:  Alert and Oriented x 3, Strength and sensation are intact Psych: euthymic mood, full affect  Wt Readings from Last 3 Encounters:  01/28/16 90.323 kg (199 lb 2 oz)  01/03/16 88.451 kg (195 lb)  12/09/15 88.451 kg (195 lb)      Studies/Labs Reviewed:   EKG:  EKG is ordered today.  The ekg ordered today demonstrates Normal sinus rhythm with a single PAC, no repolarization abnormalities  Recent Labs: 04/11/2015: TSH 1.757 04/19/2015: ALT 31 04/22/2015: BUN 9; Creatinine, Ser 0.92; Hemoglobin 12.9*;  Platelets 219; Potassium 4.1; Sodium 135   Lipid Panel    Component Value Date/Time   CHOL 181 04/12/2015 0623   TRIG 126 04/12/2015 0623   HDL 26* 04/12/2015 0623   CHOLHDL 7.0 04/12/2015 0623   VLDL 25 04/12/2015 0623   LDLCALC 130* 04/12/2015 0623     ASSESSMENT:    1. Exertional chest pain   2. Exertional dyspnea   3. Coronary artery calcification seen on CT scan   4. Aortic atherosclerosis ([place])   5. History of carotid endarterectomy               PLAN:  In order of problems listed above:  Trevor Ward gives a rather circuitous history, but he does appear to describe exertional angina and exertional dyspnea. He has symptoms equivalent to class III functional status. Dyspnea seems to dominate overt chest discomfort. There is confounding  due to the presence of gastrointestinal symptoms, but his recent GI workup has been quite benign.  There is a very high likelihood that he has significant coronary artery disease causing exertional angina and diastolic heart failure. He has extensive atherosclerosis in the aorta and carotid system, has coronary artery calcifications on CT scan and has an unfavorable lipid profile with elevated LDL and very low HDL, not on statin therapy. The history of hypertension appears questionable. Today he is not taking any medications and has a perfectly normal blood pressure. His echo did describe left ventricular hypertrophy.  I think the first order of business is to identify any significant coronary artery stenoses. I reviewed 2 possible options with Trevor Ward. I suggested that he could have a treadmill nuclear stress test, leading to cardiac catheterization abnormalities were found. The other option would be to proceed directly to coronary angiography since the likelihood of identifying coronary disease is quite high. He told me that he has already been offered a nuclear stress test and declined since he thought it was an unnecessary intermediary step.  He prefers to go straight to cardiac catheterization.  We reviewed the procedure in detail (both diagnostic and possible percutaneous intervention with angioplasty and stent). We discussed the pros and cons of the invasive approach as well as possible complications and outcomes. This procedure has been fully reviewed with the patient and written informed consent has been obtained. He prefers to have the procedure performed as quickly as possible   Medication Adjustments/Labs and Tests Ordered: Current medicines are reviewed at length with the patient today.  Concerns regarding medicines are outlined above.  Medication changes, Labs and Tests ordered today are listed in the Patient Instructions below. Patient Instructions  Your physician has requested that you have a cardiac catheterization on May 9th, 2017 with Dr Angelena Form. Cardiac catheterization is used to diagnose and/or treat various heart conditions. Doctors may recommend this procedure for a number of different reasons. The most common reason is to evaluate chest pain. Chest pain can be a symptom of coronary artery disease (CAD), and cardiac catheterization can show whether plaque is narrowing or blocking your heart's arteries. This procedure is also used to evaluate the valves, as well as measure the blood flow and oxygen levels in different parts of your heart. For further information please visit [email or web address].   Following your catheterization, you will not be allowed to drive for 3 days. No lifting, pushing, or pulling greater that 10 pounds is allowed for 1 week.  You will be required to have the following tests prior to the procedure:  1. Blood work-the blood work can be done no more than 7 days prior to the procedure. It can be done at any [facility] lab. There is one downstairs on the first floor of this building and one in the [facility] building [phone] [address]).  2. Chest Xray-the chest xray order  has already been placed at the [facility].    Mikael Spray, MD  01/28/2016 6:25 PM    [address], [place], [place]  [address] Phone: [phone]; Fax: [phone]

## 2016-01-28 NOTE — Patient Instructions (Signed)
Your physician has requested that you have a cardiac catheterization on May 9th, 2017 with Dr Angelena Form. Cardiac catheterization is used to diagnose and/or treat various heart conditions. Doctors may recommend this procedure for a number of different reasons. The most common reason is to evaluate chest pain. Chest pain can be a symptom of coronary artery disease (CAD), and cardiac catheterization can show whether plaque is narrowing or blocking your heart's arteries. This procedure is also used to evaluate the valves, as well as measure the blood flow and oxygen levels in different parts of your heart. For further information please visit [email or web address].   Following your catheterization, you will not be allowed to drive for 3 days. No lifting, pushing, or pulling greater that 10 pounds is allowed for 1 week.  You will be required to have the following tests prior to the procedure:  1. Blood work-the blood work can be done no more than 7 days prior to the procedure. It can be done at any [facility] lab. There is one downstairs on the first floor of this building and one in the [facility] building [phone]).  2. Chest Xray-the chest xray order has already been placed at the [facility].

## 2016-01-31 ENCOUNTER — Ambulatory Visit
Admission: RE | Admit: 2016-01-31 | Discharge: 2016-01-31 | Disposition: A | Discharge status: Home / Self Care | Payer: Medicare Other | Source: Ambulatory Visit | Attending: Cardiovascular Disease | Admitting: Cardiovascular Disease

## 2016-01-31 ENCOUNTER — Telehealth: Payer: Self-pay | Admitting: Cardiovascular Disease

## 2016-01-31 DIAGNOSIS — Z01812 Encounter for preprocedural laboratory examination: Secondary | ICD-10-CM | POA: Diagnosis not present

## 2016-01-31 DIAGNOSIS — R079 Chest pain, unspecified: Secondary | ICD-10-CM

## 2016-01-31 DIAGNOSIS — D689 Coagulation defect, unspecified: Secondary | ICD-10-CM | POA: Diagnosis not present

## 2016-01-31 DIAGNOSIS — R5383 Other fatigue: Secondary | ICD-10-CM | POA: Diagnosis not present

## 2016-01-31 DIAGNOSIS — R0602 Shortness of breath: Secondary | ICD-10-CM | POA: Diagnosis not present

## 2016-01-31 LAB — PROTIME-INR
INR: 1.02 (ref <1.50)
Prothrombin Time: 13.5 seconds (ref 11.6–15.2)

## 2016-01-31 LAB — APTT: APTT: 35 s (ref 24–37)

## 2016-01-31 NOTE — Telephone Encounter (Signed)
New message       Calling to give a call report on an chest xray

## 2016-01-31 NOTE — Telephone Encounter (Signed)
Received call report from [facility] radiology. CXR report in [facility] has already reported on this study.

## 2016-02-01 ENCOUNTER — Inpatient Hospital Stay (HOSPITAL_COMMUNITY): Payer: Medicare Other

## 2016-02-01 ENCOUNTER — Other Ambulatory Visit: Payer: Self-pay | Admitting: *Deleted

## 2016-02-01 ENCOUNTER — Inpatient Hospital Stay (HOSPITAL_COMMUNITY)
Admission: RE | Admit: 2016-02-01 | Discharge: 2016-02-10 | DRG: 234 | Disposition: A | Discharge status: Home / Self Care | Payer: Medicare Other | Source: Ambulatory Visit | Attending: Cardiothoracic Surgery | Admitting: Cardiothoracic Surgery

## 2016-02-01 ENCOUNTER — Encounter (HOSPITAL_COMMUNITY)
Admission: RE | Disposition: A | Discharge status: Home / Self Care | Payer: Self-pay | Source: Ambulatory Visit | Attending: Cardiothoracic Surgery

## 2016-02-01 ENCOUNTER — Encounter (HOSPITAL_COMMUNITY): Payer: Self-pay | Admitting: Cardiovascular Disease

## 2016-02-01 ENCOUNTER — Other Ambulatory Visit: Payer: Self-pay | Admitting: Cardiovascular Disease

## 2016-02-01 DIAGNOSIS — Z820 Family history of epilepsy and other diseases of the nervous system: Secondary | ICD-10-CM

## 2016-02-01 DIAGNOSIS — R079 Chest pain, unspecified: Secondary | ICD-10-CM | POA: Diagnosis not present

## 2016-02-01 DIAGNOSIS — Z803 Family history of malignant neoplasm of breast: Secondary | ICD-10-CM | POA: Diagnosis not present

## 2016-02-01 DIAGNOSIS — K567 Ileus, unspecified: Secondary | ICD-10-CM

## 2016-02-01 DIAGNOSIS — Z7982 Long term (current) use of aspirin: Secondary | ICD-10-CM | POA: Diagnosis not present

## 2016-02-01 DIAGNOSIS — I10 Essential (primary) hypertension: Secondary | ICD-10-CM | POA: Diagnosis not present

## 2016-02-01 DIAGNOSIS — Z85828 Personal history of other malignant neoplasm of skin: Secondary | ICD-10-CM

## 2016-02-01 DIAGNOSIS — D62 Acute posthemorrhagic anemia: Secondary | ICD-10-CM | POA: Diagnosis not present

## 2016-02-01 DIAGNOSIS — J9811 Atelectasis: Secondary | ICD-10-CM | POA: Diagnosis not present

## 2016-02-01 DIAGNOSIS — I360 Nonrheumatic tricuspid (valve) stenosis: Secondary | ICD-10-CM

## 2016-02-01 DIAGNOSIS — I2584 Coronary atherosclerosis due to calcified coronary lesion: Secondary | ICD-10-CM | POA: Diagnosis present

## 2016-02-01 DIAGNOSIS — K219 Gastro-esophageal reflux disease without esophagitis: Secondary | ICD-10-CM | POA: Diagnosis not present

## 2016-02-01 DIAGNOSIS — G8918 Other acute postprocedural pain: Secondary | ICD-10-CM

## 2016-02-01 DIAGNOSIS — Z09 Encounter for follow-up examination after completed treatment for conditions other than malignant neoplasm: Secondary | ICD-10-CM

## 2016-02-01 DIAGNOSIS — M7989 Other specified soft tissue disorders: Secondary | ICD-10-CM | POA: Diagnosis not present

## 2016-02-01 DIAGNOSIS — Z01818 Encounter for other preprocedural examination: Secondary | ICD-10-CM | POA: Diagnosis not present

## 2016-02-01 DIAGNOSIS — K449 Diaphragmatic hernia without obstruction or gangrene: Secondary | ICD-10-CM | POA: Diagnosis present

## 2016-02-01 DIAGNOSIS — Z8673 Personal history of transient ischemic attack (TIA), and cerebral infarction without residual deficits: Secondary | ICD-10-CM | POA: Diagnosis not present

## 2016-02-01 DIAGNOSIS — R0602 Shortness of breath: Secondary | ICD-10-CM | POA: Diagnosis not present

## 2016-02-01 DIAGNOSIS — H9190 Unspecified hearing loss, unspecified ear: Secondary | ICD-10-CM | POA: Diagnosis present

## 2016-02-01 DIAGNOSIS — I700 Atherosclerosis of aorta: Secondary | ICD-10-CM | POA: Diagnosis present

## 2016-02-01 DIAGNOSIS — R05 Cough: Secondary | ICD-10-CM | POA: Diagnosis not present

## 2016-02-01 DIAGNOSIS — I208 Other forms of angina pectoris: Secondary | ICD-10-CM

## 2016-02-01 DIAGNOSIS — J920 Pleural plaque with presence of asbestos: Secondary | ICD-10-CM | POA: Diagnosis not present

## 2016-02-01 DIAGNOSIS — Z833 Family history of diabetes mellitus: Secondary | ICD-10-CM | POA: Diagnosis not present

## 2016-02-01 DIAGNOSIS — I739 Peripheral vascular disease, unspecified: Secondary | ICD-10-CM | POA: Diagnosis not present

## 2016-02-01 DIAGNOSIS — Z79899 Other long term (current) drug therapy: Secondary | ICD-10-CM

## 2016-02-01 DIAGNOSIS — Z823 Family history of stroke: Secondary | ICD-10-CM

## 2016-02-01 DIAGNOSIS — Z8249 Family history of ischemic heart disease and other diseases of the circulatory system: Secondary | ICD-10-CM | POA: Diagnosis not present

## 2016-02-01 DIAGNOSIS — Z8261 Family history of arthritis: Secondary | ICD-10-CM | POA: Diagnosis not present

## 2016-02-01 DIAGNOSIS — E860 Dehydration: Secondary | ICD-10-CM | POA: Diagnosis not present

## 2016-02-01 DIAGNOSIS — I2510 Atherosclerotic heart disease of native coronary artery without angina pectoris: Secondary | ICD-10-CM

## 2016-02-01 DIAGNOSIS — R112 Nausea with vomiting, unspecified: Secondary | ICD-10-CM | POA: Diagnosis not present

## 2016-02-01 DIAGNOSIS — R410 Disorientation, unspecified: Secondary | ICD-10-CM | POA: Diagnosis not present

## 2016-02-01 DIAGNOSIS — Z87891 Personal history of nicotine dependence: Secondary | ICD-10-CM

## 2016-02-01 DIAGNOSIS — E785 Hyperlipidemia, unspecified: Secondary | ICD-10-CM | POA: Diagnosis present

## 2016-02-01 DIAGNOSIS — R911 Solitary pulmonary nodule: Secondary | ICD-10-CM

## 2016-02-01 DIAGNOSIS — R443 Hallucinations, unspecified: Secondary | ICD-10-CM | POA: Diagnosis not present

## 2016-02-01 DIAGNOSIS — I2089 Other forms of angina pectoris: Secondary | ICD-10-CM

## 2016-02-01 DIAGNOSIS — I2582 Chronic total occlusion of coronary artery: Secondary | ICD-10-CM | POA: Diagnosis present

## 2016-02-01 DIAGNOSIS — I6523 Occlusion and stenosis of bilateral carotid arteries: Secondary | ICD-10-CM | POA: Diagnosis not present

## 2016-02-01 DIAGNOSIS — Z9689 Presence of other specified functional implants: Secondary | ICD-10-CM

## 2016-02-01 DIAGNOSIS — I25110 Atherosclerotic heart disease of native coronary artery with unstable angina pectoris: Secondary | ICD-10-CM | POA: Diagnosis not present

## 2016-02-01 DIAGNOSIS — M109 Gout, unspecified: Secondary | ICD-10-CM | POA: Diagnosis present

## 2016-02-01 DIAGNOSIS — Z951 Presence of aortocoronary bypass graft: Secondary | ICD-10-CM

## 2016-02-01 DIAGNOSIS — I200 Unstable angina: Secondary | ICD-10-CM | POA: Diagnosis present

## 2016-02-01 DIAGNOSIS — J90 Pleural effusion, not elsewhere classified: Secondary | ICD-10-CM | POA: Diagnosis not present

## 2016-02-01 DIAGNOSIS — J939 Pneumothorax, unspecified: Secondary | ICD-10-CM | POA: Diagnosis not present

## 2016-02-01 DIAGNOSIS — I080 Rheumatic disorders of both mitral and aortic valves: Secondary | ICD-10-CM | POA: Diagnosis not present

## 2016-02-01 DIAGNOSIS — Z9889 Other specified postprocedural states: Secondary | ICD-10-CM

## 2016-02-01 HISTORY — DX: Pure hypercholesterolemia, unspecified: E78.00

## 2016-02-01 HISTORY — DX: Personal history of other diseases of the musculoskeletal system and connective tissue: Z87.39

## 2016-02-01 HISTORY — DX: Unspecified malignant neoplasm of skin of unspecified part of face: C44.300

## 2016-02-01 HISTORY — PX: CARDIAC CATHETERIZATION: SHX172

## 2016-02-01 LAB — URINALYSIS, ROUTINE W REFLEX MICROSCOPIC
Bilirubin Urine: NEGATIVE
Glucose, UA: NEGATIVE mg/dL
Hgb urine dipstick: NEGATIVE
Ketones, ur: NEGATIVE mg/dL
LEUKOCYTES UA: NEGATIVE
NITRITE: NEGATIVE
PH: 6.5 (ref 5.0–8.0)
PROTEIN: NEGATIVE mg/dL
SPECIFIC GRAVITY, URINE: 1.013 (ref 1.005–1.030)

## 2016-02-01 LAB — SURGICAL PCR SCREEN
MRSA, PCR: NEGATIVE
STAPHYLOCOCCUS AUREUS: NEGATIVE

## 2016-02-01 LAB — PULMONARY FUNCTION TEST
FEF 25-75 Post: 3.7 L/sec
FEF 25-75 Pre: 2.42 L/sec
FEF2575-%Change-Post: 52 %
FEF2575-%Pred-Post: 191 %
FEF2575-%Pred-Pre: 125 %
FEV1-%Change-Post: 10 %
FEV1-%Pred-Post: 90 %
FEV1-%Pred-Pre: 82 %
FEV1-Post: 2.48 L
FEV1-Pre: 2.25 L
FEV1FVC-%Change-Post: 1 %
FEV1FVC-%Pred-Pre: 111 %
FEV6-%Change-Post: 9 %
FEV6-%Pred-Post: 84 %
FEV6-%Pred-Pre: 76 %
FEV6-Post: 3 L
FEV6-Pre: 2.74 L
FEV6FVC-%Change-Post: 1 %
FEV6FVC-%Pred-Post: 106 %
FEV6FVC-%Pred-Pre: 105 %
FVC-%Change-Post: 8 %
FVC-%Pred-Post: 79 %
FVC-%Pred-Pre: 73 %
FVC-Post: 3.02 L
FVC-Pre: 2.79 L
Post FEV1/FVC ratio: 82 %
Post FEV6/FVC ratio: 99 %
Pre FEV1/FVC ratio: 81 %
Pre FEV6/FVC Ratio: 98 %

## 2016-02-01 LAB — BLOOD GAS, ARTERIAL
Acid-Base Excess: 0.1 mmol/L (ref 0.0–2.0)
BICARBONATE: 23.7 meq/L (ref 20.0–24.0)
Drawn by: 345601
O2 SAT: 96.2 %
PCO2 ART: 35 mmHg (ref 35.0–45.0)
PO2 ART: 79.8 mmHg — AB (ref 80.0–100.0)
Patient temperature: 98.6
TCO2: 24.7 mmol/L (ref 0–100)
pH, Arterial: 7.445 (ref 7.350–7.450)

## 2016-02-01 LAB — CBC
HCT: 44.7 % (ref 38.5–50.0)
Hemoglobin: 15.1 g/dL (ref 13.2–17.1)
MCH: 30.3 pg (ref 27.0–33.0)
MCHC: 33.8 g/dL (ref 32.0–36.0)
MCV: 89.8 fL (ref 80.0–100.0)
MPV: 9.8 fL (ref 7.5–12.5)
PLATELETS: 211 10*3/uL (ref 140–400)
RBC: 4.98 MIL/uL (ref 4.20–5.80)
RDW: 14.9 % (ref 11.0–15.0)
WBC: 6.6 10*3/uL (ref 3.8–10.8)

## 2016-02-01 LAB — ECHOCARDIOGRAM COMPLETE
Height: 68 in
Weight: 3186 oz

## 2016-02-01 LAB — BASIC METABOLIC PANEL
BUN: 9 mg/dL (ref 7–25)
CALCIUM: 8.7 mg/dL (ref 8.6–10.3)
CO2: 28 mmol/L (ref 20–31)
CREATININE: 1.01 mg/dL (ref 0.70–1.18)
Chloride: 102 mmol/L (ref 98–110)
Glucose, Bld: 108 mg/dL — ABNORMAL HIGH (ref 65–99)
Potassium: 3.9 mmol/L (ref 3.5–5.3)
Sodium: 137 mmol/L (ref 135–146)

## 2016-02-01 LAB — TYPE AND SCREEN
ABO/RH(D): O POS
Antibody Screen: NEGATIVE

## 2016-02-01 LAB — TSH: TSH: 2.55 mIU/L (ref 0.40–4.50)

## 2016-02-01 SURGERY — LEFT HEART CATH AND CORONARY ANGIOGRAPHY
Anesthesia: LOCAL

## 2016-02-01 MED ORDER — PHENYLEPHRINE HCL 10 MG/ML IJ SOLN
30.0000 ug/min | INTRAVENOUS | Status: AC
  Administered 2016-02-02: 20 ug/min via INTRAVENOUS
  Filled 2016-02-01: qty 2

## 2016-02-01 MED ORDER — LIDOCAINE HCL (PF) 1 % IJ SOLN
INTRAMUSCULAR | Status: DC | PRN
  Administered 2016-02-01: 2 mL

## 2016-02-01 MED ORDER — LIDOCAINE HCL (PF) 1 % IJ SOLN
INTRAMUSCULAR | Status: AC
  Filled 2016-02-01: qty 30

## 2016-02-01 MED ORDER — SODIUM CHLORIDE 0.9 % IV SOLN
INTRAVENOUS | Status: AC
  Administered 2016-02-02: 69.8 mL/h via INTRAVENOUS
  Filled 2016-02-01: qty 40

## 2016-02-01 MED ORDER — LISINOPRIL 10 MG PO TABS
10.0000 mg | ORAL_TABLET | Freq: Every day | ORAL | Status: DC
  Filled 2016-02-01: qty 1

## 2016-02-01 MED ORDER — TEMAZEPAM 15 MG PO CAPS: 15.0000 mg | ORAL_CAPSULE | Freq: Once | ORAL | Status: DC | PRN

## 2016-02-01 MED ORDER — GLUCOSAMINE SULFATE 1000 MG PO TABS: 2000.0000 mg | ORAL_TABLET | Freq: Every day | ORAL | Status: DC

## 2016-02-01 MED ORDER — METOPROLOL TARTRATE 25 MG PO TABS
25.0000 mg | ORAL_TABLET | Freq: Two times a day (BID) | ORAL | Status: DC
  Administered 2016-02-01 (×2): 25 mg via ORAL
  Filled 2016-02-01 (×2): qty 1

## 2016-02-01 MED ORDER — CHLORHEXIDINE GLUCONATE 4 % EX LIQD
60.0000 mL | Freq: Once | CUTANEOUS | Status: AC
  Administered 2016-02-02: 4 via TOPICAL
  Filled 2016-02-01: qty 60

## 2016-02-01 MED ORDER — SODIUM CHLORIDE 0.9 % WEIGHT BASED INFUSION
3.0000 mL/kg/h | INTRAVENOUS | Status: DC
  Administered 2016-02-01: 3 mL/kg/h via INTRAVENOUS

## 2016-02-01 MED ORDER — EPINEPHRINE HCL 1 MG/ML IJ SOLN
0.0000 ug/min | INTRAVENOUS | Status: DC
  Filled 2016-02-01: qty 4

## 2016-02-01 MED ORDER — CHLORHEXIDINE GLUCONATE 0.12 % MT SOLN
15.0000 mL | Freq: Once | OROMUCOSAL | Status: AC
  Administered 2016-02-02: 15 mL via OROMUCOSAL
  Filled 2016-02-01: qty 15

## 2016-02-01 MED ORDER — SODIUM CHLORIDE 0.9 % WEIGHT BASED INFUSION: 1.0000 mL/kg/h | INTRAVENOUS | Status: DC

## 2016-02-01 MED ORDER — HEPARIN SODIUM (PORCINE) 1000 UNIT/ML IJ SOLN
INTRAMUSCULAR | Status: AC
  Filled 2016-02-01: qty 1

## 2016-02-01 MED ORDER — ACETAMINOPHEN 325 MG PO TABS: 650.0000 mg | ORAL_TABLET | ORAL | Status: DC | PRN

## 2016-02-01 MED ORDER — HEPARIN SODIUM (PORCINE) 1000 UNIT/ML IJ SOLN
INTRAMUSCULAR | Status: DC | PRN
  Administered 2016-02-01: 5000 [IU] via INTRAVENOUS

## 2016-02-01 MED ORDER — MIDAZOLAM HCL 2 MG/2ML IJ SOLN
INTRAMUSCULAR | Status: AC
  Filled 2016-02-01: qty 2

## 2016-02-01 MED ORDER — SODIUM CHLORIDE 0.9 % IV SOLN
INTRAVENOUS | Status: DC
  Administered 2016-02-01: 13:00:00 via INTRAVENOUS

## 2016-02-01 MED ORDER — MAGNESIUM SULFATE 50 % IJ SOLN
40.0000 meq | INTRAMUSCULAR | Status: DC
  Filled 2016-02-01: qty 10

## 2016-02-01 MED ORDER — MIDAZOLAM HCL 2 MG/2ML IJ SOLN
INTRAMUSCULAR | Status: DC | PRN
  Administered 2016-02-01: 2 mg via INTRAVENOUS

## 2016-02-01 MED ORDER — DEXMEDETOMIDINE HCL IN NACL 400 MCG/100ML IV SOLN
0.1000 ug/kg/h | INTRAVENOUS | Status: AC
  Administered 2016-02-02: .2 ug/kg/h via INTRAVENOUS
  Filled 2016-02-01: qty 100

## 2016-02-01 MED ORDER — ONDANSETRON HCL 4 MG/2ML IJ SOLN: 4.0000 mg | Freq: Four times a day (QID) | INTRAMUSCULAR | Status: DC | PRN

## 2016-02-01 MED ORDER — DOPAMINE-DEXTROSE 3.2-5 MG/ML-% IV SOLN
0.0000 ug/kg/min | INTRAVENOUS | Status: DC
  Filled 2016-02-01: qty 250

## 2016-02-01 MED ORDER — CHLORHEXIDINE GLUCONATE 4 % EX LIQD
60.0000 mL | Freq: Once | CUTANEOUS | Status: AC
  Administered 2016-02-01: 4 via TOPICAL
  Filled 2016-02-01: qty 60

## 2016-02-01 MED ORDER — POTASSIUM CHLORIDE 2 MEQ/ML IV SOLN
80.0000 meq | INTRAVENOUS | Status: DC
  Filled 2016-02-01: qty 40

## 2016-02-01 MED ORDER — ASPIRIN 81 MG PO CHEW
81.0000 mg | CHEWABLE_TABLET | Freq: Every day | ORAL | Status: DC
  Filled 2016-02-01 (×2): qty 1

## 2016-02-01 MED ORDER — VITAMIN B-12 500 MCG PO TABS: 500.0000 ug | ORAL_TABLET | Freq: Every day | ORAL | Status: DC

## 2016-02-01 MED ORDER — PANTOPRAZOLE SODIUM 40 MG PO TBEC
40.0000 mg | DELAYED_RELEASE_TABLET | Freq: Every day | ORAL | Status: DC
  Administered 2016-02-01: 40 mg via ORAL
  Filled 2016-02-01: qty 1

## 2016-02-01 MED ORDER — SODIUM CHLORIDE 0.9% FLUSH: 3.0000 mL | INTRAVENOUS | Status: DC | PRN

## 2016-02-01 MED ORDER — HEPARIN (PORCINE) IN NACL 2-0.9 UNIT/ML-% IJ SOLN
INTRAMUSCULAR | Status: DC | PRN
  Administered 2016-02-01: 1500 mL

## 2016-02-01 MED ORDER — AMITRIPTYLINE HCL 10 MG PO TABS
10.0000 mg | ORAL_TABLET | Freq: Every day | ORAL | Status: DC
  Administered 2016-02-01 – 2016-02-09 (×8): 10 mg via ORAL
  Filled 2016-02-01 (×9): qty 1

## 2016-02-01 MED ORDER — IOPAMIDOL (ISOVUE-370) INJECTION 76%
INTRAVENOUS | Status: AC
  Filled 2016-02-01: qty 100

## 2016-02-01 MED ORDER — OXYCODONE-ACETAMINOPHEN 5-325 MG PO TABS: 1.0000 | ORAL_TABLET | ORAL | Status: DC | PRN

## 2016-02-01 MED ORDER — ALBUTEROL SULFATE (2.5 MG/3ML) 0.083% IN NEBU
2.5000 mg | INHALATION_SOLUTION | Freq: Once | RESPIRATORY_TRACT | Status: AC
  Administered 2016-02-01: 2.5 mg via RESPIRATORY_TRACT

## 2016-02-01 MED ORDER — DEXTROSE 5 % IV SOLN
750.0000 mg | INTRAVENOUS | Status: DC
  Filled 2016-02-01: qty 750

## 2016-02-01 MED ORDER — HEPARIN SODIUM (PORCINE) 1000 UNIT/ML IJ SOLN
INTRAMUSCULAR | Status: DC
  Filled 2016-02-01: qty 30

## 2016-02-01 MED ORDER — VERAPAMIL HCL 2.5 MG/ML IV SOLN
INTRAVENOUS | Status: DC | PRN
  Administered 2016-02-01: 10 mL via INTRA_ARTERIAL

## 2016-02-01 MED ORDER — SODIUM CHLORIDE 0.9 % IV SOLN
INTRAVENOUS | Status: AC
  Administered 2016-02-02: 1.2 [IU]/h via INTRAVENOUS
  Filled 2016-02-01: qty 2.5

## 2016-02-01 MED ORDER — METOPROLOL TARTRATE 12.5 MG HALF TABLET
12.5000 mg | ORAL_TABLET | Freq: Once | ORAL | Status: AC
  Administered 2016-02-02: 12.5 mg via ORAL
  Filled 2016-02-01: qty 1

## 2016-02-01 MED ORDER — SODIUM CHLORIDE 0.9 % IV SOLN
250.0000 mL | INTRAVENOUS | Status: DC | PRN
Start: 2016-02-01 — End: 2016-02-02

## 2016-02-01 MED ORDER — DEXTROSE 5 % IV SOLN
1.5000 g | INTRAVENOUS | Status: AC
  Administered 2016-02-02: 1.5 g via INTRAVENOUS
  Administered 2016-02-02: .75 g via INTRAVENOUS
  Filled 2016-02-01: qty 1.5

## 2016-02-01 MED ORDER — FENTANYL CITRATE (PF) 100 MCG/2ML IJ SOLN
INTRAMUSCULAR | Status: AC
  Filled 2016-02-01: qty 2

## 2016-02-01 MED ORDER — PLASMA-LYTE 148 IV SOLN
INTRAVENOUS | Status: AC
  Administered 2016-02-02: 500 mL
  Filled 2016-02-01: qty 2.5

## 2016-02-01 MED ORDER — FENTANYL CITRATE (PF) 100 MCG/2ML IJ SOLN
INTRAMUSCULAR | Status: DC | PRN
  Administered 2016-02-01: 50 ug via INTRAVENOUS

## 2016-02-01 MED ORDER — NITROGLYCERIN IN D5W 200-5 MCG/ML-% IV SOLN
2.0000 ug/min | INTRAVENOUS | Status: DC
  Filled 2016-02-01: qty 250

## 2016-02-01 MED ORDER — BISACODYL 5 MG PO TBEC: 5.0000 mg | DELAYED_RELEASE_TABLET | Freq: Once | ORAL | Status: DC

## 2016-02-01 MED ORDER — ALLOPURINOL 100 MG PO TABS
100.0000 mg | ORAL_TABLET | Freq: Two times a day (BID) | ORAL | Status: DC
  Administered 2016-02-01 (×2): 100 mg via ORAL
  Filled 2016-02-01 (×2): qty 1

## 2016-02-01 MED ORDER — HEPARIN (PORCINE) IN NACL 2-0.9 UNIT/ML-% IJ SOLN
INTRAMUSCULAR | Status: AC
  Filled 2016-02-01: qty 1500

## 2016-02-01 MED ORDER — MORPHINE SULFATE (PF) 2 MG/ML IV SOLN: 2.0000 mg | INTRAVENOUS | Status: DC | PRN

## 2016-02-01 MED ORDER — VANCOMYCIN HCL 10 G IV SOLR
1500.0000 mg | INTRAVENOUS | Status: AC
  Administered 2016-02-02: 1500 mg via INTRAVENOUS
  Filled 2016-02-01: qty 1500

## 2016-02-01 MED ORDER — SODIUM CHLORIDE 0.9% FLUSH: 3.0000 mL | Freq: Two times a day (BID) | INTRAVENOUS | Status: DC

## 2016-02-01 MED ORDER — ASPIRIN 81 MG PO CHEW: 81.0000 mg | CHEWABLE_TABLET | ORAL | Status: DC

## 2016-02-01 MED ORDER — VERAPAMIL HCL 2.5 MG/ML IV SOLN
INTRAVENOUS | Status: AC
  Filled 2016-02-01: qty 2

## 2016-02-01 MED ORDER — IOPAMIDOL (ISOVUE-370) INJECTION 76%
INTRAVENOUS | Status: DC | PRN
  Administered 2016-02-01: 80 mL via INTRA_ARTERIAL

## 2016-02-01 SURGICAL SUPPLY — 11 items

## 2016-02-01 NOTE — H&P (View-Only) (Signed)
Patient ID: Trevor Ward, male   DOB: July 21, 1941, 75 y.o.   MRN: [id number]    Cardiology Office Note    Date:  01/28/2016   ID:  [name], DOB 15-Aug-1941, MRN [id number]  PCP:  Mayra Neer, MD  Cardiologist:   Sanda Klein, MD   Chief Complaint  Patient presents with  . New Evaluation    Mild exertional chest pain, occassional shortness of breath, no edema in legs, no pain in legs,no lightheaded or dizziness    History of Present Illness:  Trevor Ward is a 75 y.o. male with a history of previous small stroke and carotid artery stenosis status post urgent right carotid endarterectomy in July 2016.   He is referred for chest pain. He initially denies any chest pain complaints and states that he is here to discuss shortness of breath. He gives a rambling history, but eventually it becomes apparent that during moderate physical activity he develops both chest discomfort and shortness of breath. Taking out food to his dog-pen triggers both dyspnea and a burning retrosternal sensation. He attributes this to his hiatal hernia. He returns to the house, sits down in a chair, drinks a glass of cold milk and has gradual relief.  He was complaining of epigastric pain which improved when his dose of aspirin was decreased from 325 down to 81 mg daily. Abdominal ultrasonography showed prominent aortic atherosclerosis, no GI abnormalities. Barium swallow was fairly unremarkable except for gastric fold thickening in the fundus. He subsequently had an EGD that did not show significant abnormalities. He also describes coughing that occurs every time he lays down and resolves after about 10 minutes. He sleeps with the head of the bed elevated on a brick due to gastroesophageal reflux disease.  He has a history of hypertension, but currently is not taking any medications for his blood pressure and has normal blood pressure readings. He is not taking a statin. Previous chest CT shows abundant  aortic atherosclerosis as well as calcification in the distribution of the coronary arteries. Fasting glucose is borderline at 109, he has normal renal function, his LDL cholesterol is elevated at 127 and he has a markedly low HDL cholesterol of 24. Echocardiography in 2016 showed normal left ventricular systolic function, EF [id number] percent, moderate left ventricular hypertrophy and mild grade 1 diastolic dysfunction. Mention is made of aortic valve sclerosis and mild aortic regurgitation.    Past Medical History  Diagnosis Date  . Hypertension   . Stroke ([place])   . Peripheral vascular disease ([place])     carotid artery stenosis  . History of hiatal hernia   . Depression   . GERD (gastroesophageal reflux disease)   . Arthritis   . Cancer ([place])     skin cancer - on face "the good kind"  . Gout     Past Surgical History  Procedure Laterality Date  . Peripheral vascular catheterization Bilateral 04/14/2015    Procedure: Carotid Angiography;  Surgeon: Serafina Mitchell, MD;  Location: [facility];  Service: Cardiovascular;  Laterality: Bilateral;  . Knee arthroscopy Right   . Colonoscopy    . Endarterectomy Right 04/21/2015    Procedure: RIGHT CAROTID ENDARTERECTOMY;  Surgeon: Elam Dutch, MD;  Location: [facility];  Service: Vascular;  Laterality: Right;  . Esophagogastroduodenoscopy (egd) with propofol N/A 01/03/2016    Procedure: ESOPHAGOGASTRODUODENOSCOPY (EGD) WITH PROPOFOL;  Surgeon: Garlan Fair, MD;  Location: [facility];  Service: Endoscopy;  Laterality: N/A;    Current  Medications: Outpatient Prescriptions Prior to Visit  Medication Sig Dispense Refill  . aspirin 81 MG tablet Take 81 mg by mouth daily.    Marland Kitchen esomeprazole (NEXIUM) 40 MG capsule Take 40 mg by mouth daily at 12 noon.    Marland Kitchen allopurinol (ZYLOPRIM) 100 MG tablet Take 100 mg by mouth 2 (two) times daily.    Marland Kitchen lisinopril (PRINIVIL,ZESTRIL) 10 MG tablet Take 10 mg by mouth daily.     No facility-administered  medications prior to visit.     Allergies:   Statins   Social History   Social History  . Marital Status: Married    Spouse Name: N/A  . Number of Children: N/A  . Years of Education: N/A   Social History Main Topics  . Smoking status: Former Smoker    Start date: 01/28/1956  . Smokeless tobacco: Never Used  . Alcohol Use: No  . Drug Use: No  . Sexual Activity: Not Asked   Other Topics Concern  . None   Social History Narrative   Epworth Sleepiness Scale = 16 (as of 01/28/16)     Family History:  [name] family history includes Arthritis in his mother; Breast cancer in his mother; CAD in his brother, brother, and mother; CVA in his brother, mother, and sister; Diabetes in his brother, brother, and mother; Heart attack in his father; Parkinson's disease in his mother.   ROS:   Please see the history of present illness.    ROS All other systems reviewed and are negative.   PHYSICAL EXAM:   VS:  BP 124/76 mmHg  Pulse 94  Ht 5\' 8"  (1.727 m)  Wt 90.323 kg (199 lb 2 oz)  BMI 30.28 kg/m2   GEN: Well nourished, well developed, in no acute distress HEENT: normal Neck: no JVD, carotid bruits, or masses. Healed right carotid endarterectomy scar Cardiac: RRR; no murmurs, rubs, or gallops,no edema  Respiratory:  clear to auscultation bilaterally, normal work of breathing GI: soft, nontender, nondistended, + BS MS: no deformity or atrophy Skin: warm and dry, no rash Neuro:  Alert and Oriented x 3, Strength and sensation are intact Psych: euthymic mood, full affect  Wt Readings from Last 3 Encounters:  01/28/16 90.323 kg (199 lb 2 oz)  01/03/16 88.451 kg (195 lb)  12/09/15 88.451 kg (195 lb)      Studies/Labs Reviewed:   EKG:  EKG is ordered today.  The ekg ordered today demonstrates Normal sinus rhythm with a single PAC, no repolarization abnormalities  Recent Labs: 04/11/2015: TSH 1.757 04/19/2015: ALT 31 04/22/2015: BUN 9; Creatinine, Ser 0.92; Hemoglobin 12.9*;  Platelets 219; Potassium 4.1; Sodium 135   Lipid Panel    Component Value Date/Time   CHOL 181 04/12/2015 0623   TRIG 126 04/12/2015 0623   HDL 26* 04/12/2015 0623   CHOLHDL 7.0 04/12/2015 0623   VLDL 25 04/12/2015 0623   LDLCALC 130* 04/12/2015 0623     ASSESSMENT:    1. Exertional chest pain   2. Exertional dyspnea   3. Coronary artery calcification seen on CT scan   4. Aortic atherosclerosis ([place])   5. History of carotid endarterectomy               PLAN:  In order of problems listed above:  Mr. Madden gives a rather circuitous history, but he does appear to describe exertional angina and exertional dyspnea. He has symptoms equivalent to class III functional status. Dyspnea seems to dominate overt chest discomfort. There is confounding  due to the presence of gastrointestinal symptoms, but his recent GI workup has been quite benign.  There is a very high likelihood that he has significant coronary artery disease causing exertional angina and diastolic heart failure. He has extensive atherosclerosis in the aorta and carotid system, has coronary artery calcifications on CT scan and has an unfavorable lipid profile with elevated LDL and very low HDL, not on statin therapy. The history of hypertension appears questionable. Today he is not taking any medications and has a perfectly normal blood pressure. His echo did describe left ventricular hypertrophy.  I think the first order of business is to identify any significant coronary artery stenoses. I reviewed 2 possible options with Mr. Steveson. I suggested that he could have a treadmill nuclear stress test, leading to cardiac catheterization abnormalities were found. The other option would be to proceed directly to coronary angiography since the likelihood of identifying coronary disease is quite high. He told me that he has already been offered a nuclear stress test and declined since he thought it was an unnecessary intermediary step.  He prefers to go straight to cardiac catheterization.  We reviewed the procedure in detail (both diagnostic and possible percutaneous intervention with angioplasty and stent). We discussed the pros and cons of the invasive approach as well as possible complications and outcomes. This procedure has been fully reviewed with the patient and written informed consent has been obtained. He prefers to have the procedure performed as quickly as possible   Medication Adjustments/Labs and Tests Ordered: Current medicines are reviewed at length with the patient today.  Concerns regarding medicines are outlined above.  Medication changes, Labs and Tests ordered today are listed in the Patient Instructions below. Patient Instructions  Your physician has requested that you have a cardiac catheterization on May 9th, 2017 with Dr Angelena Form. Cardiac catheterization is used to diagnose and/or treat various heart conditions. Doctors may recommend this procedure for a number of different reasons. The most common reason is to evaluate chest pain. Chest pain can be a symptom of coronary artery disease (CAD), and cardiac catheterization can show whether plaque is narrowing or blocking your heart's arteries. This procedure is also used to evaluate the valves, as well as measure the blood flow and oxygen levels in different parts of your heart. For further information please visit [email or web address].   Following your catheterization, you will not be allowed to drive for 3 days. No lifting, pushing, or pulling greater that 10 pounds is allowed for 1 week.  You will be required to have the following tests prior to the procedure:  1. Blood work-the blood work can be done no more than 7 days prior to the procedure. It can be done at any [facility] lab. There is one downstairs on the first floor of this building and one in the [facility] building [phone]).  2. Chest Xray-the chest xray order  has already been placed at the [facility].    Mikael Spray, MD  01/28/2016 6:25 PM    [address], [place],   [address] Phone: [phone]; Fax: [phone]

## 2016-02-01 NOTE — Anesthesia Preprocedure Evaluation (Addendum)
Anesthesia Evaluation  Patient identified by MRN, date of birth, ID band Patient awake    Reviewed: Allergy & Precautions, NPO status , Patient's Chart, lab work & pertinent test results  Airway Mallampati: I  TM Distance: >3 FB Neck ROM: Full    Dental  (+) Upper Dentures, Lower Dentures   Pulmonary former smoker,    breath sounds clear to auscultation- rhonchi       Cardiovascular hypertension, Pt. on medications + angina + CAD and + Peripheral Vascular Disease   Rhythm:Regular Rate:Normal     Neuro/Psych PSYCHIATRIC DISORDERS Depression CVA    GI/Hepatic Neg liver ROS, hiatal hernia, GERD  Medicated,  Endo/Other  negative endocrine ROS  Renal/GU negative Renal ROS  negative genitourinary   Musculoskeletal  (+) Arthritis ,   Abdominal   Peds negative pediatric ROS (+)  Hematology negative hematology ROS (+)   Anesthesia Other Findings   Reproductive/Obstetrics negative OB ROS                            Lab Results  Component Value Date   WBC 6.6 01/31/2016   HGB 15.1 01/31/2016   HCT 44.7 01/31/2016   MCV 89.8 01/31/2016   PLT 211 01/31/2016   Lab Results  Component Value Date   CREATININE 1.01 01/31/2016   BUN 9 01/31/2016   NA 137 01/31/2016   K 3.9 01/31/2016   CL 102 01/31/2016   CO2 28 01/31/2016   Lab Results  Component Value Date   INR 1.02 01/31/2016   INR 1.05 04/11/2015   01/2016 EKG: normal sinus rhythm.  01/2016 Echo - Left ventricle: The cavity size was normal. There was moderate concentric hypertrophy. Systolic function was normal. The estimated ejection fraction was in the range of 60% to 65%. Wall motion was normal; there were no regional wall motion abnormalities. Doppler parameters are consistent with abnormal left ventricular relaxation (grade 1 diastolic dysfunction). Doppler parameters are consistent with elevated ventricular end-diastolic filling  pressure. - Aortic valve: Possibly bicuspid; moderately thickened, moderately calcified leaflets. There was mild regurgitation. Peak gradient (S): 14 mm Hg. Mean gradient 7 mmHg. Valve area (VTI): 2.35 cm^2. Valve area (Vmax): 2.2 cm^2. Valve area (Vmean): 2.16 cm^2. - Aortic root: The aortic root was normal in size. - Ascending aorta: The ascending aorta was not visualized. - Mitral valve: Structurally normal valve. There was mild regurgitation. - Left atrium: The atrium was mildly dilated. - Right ventricle: Systolic function was normal. - Right atrium: The atrium was normal in size. - Tricuspid valve: There was trivial regurgitation. - Pulmonary arteries: Systolic pressure was within the normal range. - Inferior vena cava: The vessel was normal in size. - Pericardium, extracardiac: There was no pericardial effusion.    Anesthesia Physical Anesthesia Plan  ASA: IV  Anesthesia Plan: General   Post-op Pain Management:    Induction: Intravenous  Airway Management Planned: Oral ETT  Additional Equipment: Arterial line, CVP, PA Cath, 3D TEE and TEE  Intra-op Plan:   Post-operative Plan: Post-operative intubation/ventilation  Informed Consent: I have reviewed the patients History and Physical, chart, labs and discussed the procedure including the risks, benefits and alternatives for the proposed anesthesia with the patient or authorized representative who has indicated his/her understanding and acceptance.   Dental advisory given  Plan Discussed with: CRNA  Anesthesia Plan Comments:         Anesthesia Quick Evaluation

## 2016-02-01 NOTE — Interval H&P Note (Signed)
History and Physical Interval Note:  02/01/2016 10:34 AM  Trevor Ward  has presented today for cardiac cath with the diagnosis of unstable angina. The various methods of treatment have been discussed with the patient and family. After consideration of risks, benefits and other options for treatment, the patient has consented to  Procedure(s): Left Heart Cath and Coronary Angiography (N/A) as a surgical intervention .  [name] history has been reviewed, patient examined, no change in status, stable for surgery.  I have reviewed the patient's chart and labs.  Questions were answered to the patient's satisfaction.     [name]

## 2016-02-01 NOTE — Progress Notes (Signed)
[id number] Discussed with pt the importance of mobility and IS after surgery. Discussed sternal precautions and demonstrated how to get up and down properly. Gave OHS booklet and care guide to wife and wrote how to view pre op video. Wife knows pt will need someone with him 24/7 first week home after discharge. Graylon Good RN BSN 02/01/2016 3:19 PM

## 2016-02-01 NOTE — Progress Notes (Signed)
VASCULAR LAB PRELIMINARY  PRELIMINARY  PRELIMINARY  PRELIMINARY  Pre-op Cardiac Surgery  Carotid Findings:  Carotid Doppler completed 3/17 at [facility] Results - Right - history of rt endarterectomy. No evidence of restenosis. Left - 1% to 39% ICA stenosis. Bilateral - Vertebral artery flow is antegrade.  Upper Extremity Right Left  Brachial Pressures 165 Triphasic 145 Triphasic  Radial Waveforms Triphasic Triphasic  Ulnar Waveforms Triphasic Triphasic  Palmar Arch (Allen's Test) Normal Normal   Findings:  Doppler waveforms remained normal bilaterally with both radial and ulnar compressions.    Lower  Extremity Right Left  Dorsalis Pedis 172 Triphasic 210 Triphasic  Posterior Tibial 197 Triphasic 221 Triphasic  Ankle/Brachial Indices 1.19 1.34    Findings:  ABIs and Doppler waveforms indicate normal arterial flow bilaterally at rest   Keilon Ressel, RVS 02/01/2016, 6:52 PM

## 2016-02-01 NOTE — Progress Notes (Signed)
Echocardiogram 2D Echocardiogram has been performed.  Joelene Millin 02/01/2016, 3:25 PM

## 2016-02-01 NOTE — Consult Note (Addendum)
[address]       [place] [address]             [phone]        Trinten A Kreutzer [place] Medical Record [id number] Date of Birth: [birth year]  Referring: [place] Primary Care: Mayra Neer, MD  Chief Complaint:  CAD, needs CABG  History of Present Illness:      Mr. Prejean is a 75 yo  white male. He has had several weeks of shortness of breath and what he describes as indigestion.  He denies chest pain and states he has never had chest pain.  He underwent complete GI workup which was negative.  Due to this the patient was referred for Cardiology evaluation.  He was seen by Dr. Sallyanne Kuster who felt he was most likely experiencing angina.  He offered the patient a stress test, but the patient ultimately felt that was an un-neccessary step and wished to proceed with cardiac catheterization.  This was done today and showed severe 3 vessel CAD.  It was felt coronary bypass grafting would be indicated and TCTS consult was obtained.  Currently the patient is chest pain free and he again states he has never had chest pain.  He states he gets short of breath with little activity.  He has a history of HTN, but recently quit taking his medication stating it made his stomach pain go away.  He had a Right CEA recently and states his left side has about af 40% stenosis.  He denies a history of DM and nicotine abuse.  He states his father passed away in his 49s from a heart attack.  Current Activity/ Functional Status: Patient is independent with mobility/ambulation, transfers, ADL's, IADL's.   Zubrod Score: At the time of surgery this patient's most appropriate activity status/level should be described as: []     0    Normal activity, no symptoms [x]     1    Restricted in physical strenuous activity but ambulatory, able to do out light work []     2    Ambulatory and capable of self care, unable to do work activities, up and about                 more than 50%  Of the time                             []     3    Only limited self care, in bed greater than 50% of waking hours []     4    Completely disabled, no self care, confined to bed or chair []     5    Moribund  Past Medical History  Diagnosis Date  . Hypertension   . Stroke ([facility])   . Peripheral vascular disease ([facility])     carotid artery stenosis  . History of hiatal hernia   . Depression   . GERD (gastroesophageal reflux disease)   . Arthritis   . Cancer ([facility])     skin cancer - on face "the good kind"  . Gout     Past Surgical History  Procedure Laterality Date  . Peripheral vascular catheterization Bilateral 04/14/2015    Procedure: Carotid Angiography;  Surgeon: Serafina Mitchell, MD;  Location: [facility];  Service: Cardiovascular;  Laterality: Bilateral;  . Knee arthroscopy Right   . Colonoscopy    . Endarterectomy Right 04/21/2015  Procedure: RIGHT CAROTID ENDARTERECTOMY;  Surgeon: Elam Dutch, MD;  Location: [facility];  Service: Vascular;  Laterality: Right;  . Esophagogastroduodenoscopy (egd) with propofol N/A 01/03/2016    Procedure: ESOPHAGOGASTRODUODENOSCOPY (EGD) WITH PROPOFOL;  Surgeon: Garlan Fair, MD;  Location: [facility];  Service: Endoscopy;  Laterality: N/A;  . Cardiac catheterization N/A 02/01/2016    Procedure: Left Heart Cath and Coronary Angiography;  Surgeon: Burnell Blanks, MD;  Location: [facility];  Service: Cardiovascular;  Laterality: N/A;    History  Smoking status  . Former Smoker  . Start date: 01/28/1956  Smokeless tobacco  . Never Used    History  Alcohol Use No    Social History   Social History  . Marital Status: Married    Spouse Name: N/A  . Number of Children: N/A  . Years of Education: N/A   Occupational History  . Not on file.   Social History Main Topics  . Smoking status: Former Smoker    Start date: 01/28/1956  . Smokeless tobacco: Never Used  . Alcohol Use: No  . Drug Use: No  . Sexual Activity: Not on file    Other Topics Concern  . Not on file   Social History Narrative   Epworth Sleepiness Scale = 16 (as of 01/28/16)    Allergies  Allergen Reactions  . Statins Other (See Comments)    Severe myalgias    Current Facility-Administered Medications  Medication Dose Route Frequency Provider Last Rate Last Dose  . 0.9 %  sodium chloride infusion  250 mL Intravenous PRN Burnell Blanks, MD      . 0.9 %  sodium chloride infusion   Intravenous Continuous Burnell Blanks, MD 75 mL/hr at 02/01/16 1250    . acetaminophen (TYLENOL) tablet 650 mg  650 mg Oral Q4H PRN Burnell Blanks, MD      . allopurinol (ZYLOPRIM) tablet 100 mg  100 mg Oral BID Burnell Blanks, MD   100 mg at 02/01/16 1248  . amitriptyline (ELAVIL) tablet 10 mg  10 mg Oral QHS Burnell Blanks, MD      . aspirin chewable tablet 81 mg  81 mg Oral Daily Burnell Blanks, MD      . lisinopril (PRINIVIL,ZESTRIL) tablet 10 mg  10 mg Oral Daily Burnell Blanks, MD   10 mg at 02/01/16 1215  . metoprolol tartrate (LOPRESSOR) tablet 25 mg  25 mg Oral BID Burnell Blanks, MD   25 mg at 02/01/16 1248  . morphine 2 MG/ML injection 2 mg  2 mg Intravenous Q1H PRN Burnell Blanks, MD      . ondansetron [facility]) injection 4 mg  4 mg Intravenous Q6H PRN Burnell Blanks, MD      . oxyCODONE-acetaminophen (PERCOCET/ROXICET) 5-325 MG per tablet 1-2 tablet  1-2 tablet Oral Q4H PRN Burnell Blanks, MD      . pantoprazole (PROTONIX) EC tablet 40 mg  40 mg Oral Daily Burnell Blanks, MD   40 mg at 02/01/16 1247  . sodium chloride flush (NS) 0.9 % injection 3 mL  3 mL Intravenous Q12H Burnell Blanks, MD      . sodium chloride flush (NS) 0.9 % injection 3 mL  3 mL Intravenous PRN Burnell Blanks, MD        Prescriptions prior to admission  Medication Sig Dispense Refill Last Dose  . allopurinol (ZYLOPRIM) 100 MG tablet Take 100 mg by mouth 2 (  two) times daily.    01/31/2016 at Unknown time  . amitriptyline (ELAVIL) 10 MG tablet Take 10 mg by mouth at bedtime.    01/31/2016 at Unknown time  . aspirin 81 MG tablet Take 81 mg by mouth daily.   02/01/2016 at 0700  . esomeprazole (NEXIUM) 40 MG capsule Take 40 mg by mouth 2 (two) times daily before a meal.    01/31/2016 at Unknown time  . Glucosamine Sulfate (GLUCOSAMINE RELIEF) 1000 MG TABS Take 2,000 mg by mouth daily.   01/31/2016 at Unknown time  . lisinopril (PRINIVIL,ZESTRIL) 10 MG tablet Take 10 mg by mouth daily.   01/31/2016 at Unknown time  . Omega-3 Fatty Acids (FISH OIL) 1200 MG CAPS Take 1,200 mg by mouth daily.   01/31/2016 at Unknown time  . vitamin B-12 (CYANOCOBALAMIN) 500 MCG tablet Take 500 mcg by mouth daily.   01/31/2016 at Unknown time    Family History  Problem Relation Age of Onset  . CAD Mother   . CVA Mother   . Diabetes Mother   . Breast cancer Mother   . Parkinson's disease Mother   . Arthritis Mother   . Heart attack Father   . CVA Sister   . CAD Brother   . Diabetes Brother   . CVA Brother   . CAD Brother   . Diabetes Brother     Review of Systems:  Constitutional: negative Eyes: negative Respiratory: positive for dyspnea on exertion Cardiovascular: positive for fatigue, negative for chest pain, chest pressure/discomfort and exertional chest pressure/discomfort Gastrointestinal: positive for abdominal pain, dyspepsia and reflux symptoms Integument/breast: negative Neurological: positive for CVA in past     Cardiac Review of Systems: Y or N  Chest Pain [  n  ]  Resting SOB [ n  ] Exertional SOB  Blue.Reese  ]  Orthopnea [  ]   Pedal Edema [   ]    Palpitations [n  ] Syncope  [  ]   Presyncope [   ]  General Review of Systems: [Y] = yes [  ]=no Constitional: recent weight change [  ]; anorexia [  ]; fatigue [ y ]; nausea [  ]; night sweats [  ]; fever [  ]; or chills [  ]                                                               Dental: poor dentition[  ]; Last Dentist visit:    Eye : blurred vision [  ]; diplopia [   ]; vision changes [  ];  Amaurosis fugax[  ]; Resp: cough [  ];  wheezing[  ];  hemoptysis[  ]; shortness of breath[ y ]; paroxysmal nocturnal dyspnea[  ]; dyspnea on exertion[y  ]; or orthopnea[  ];  GI:  gallstones[  ], vomiting[  ];  dysphagia[  ]; melena[  ];  hematochezia [  ]; heartburn[ y ];   Hx of  Colonoscopy[  ]; GU: kidney stones [  ]; hematuria[  ];   dysuria [  ];  nocturia[  ];  history of     obstruction [  ]; urinary frequency [  ]             Skin: rash, swelling[  ];,  hair loss[  ];  peripheral edema[  ];  or itching[  ]; Musculosketetal: myalgias[ y with statin use ];  joint swelling[  ];  joint erythema[  ];  joint pain[  ];  back pain[  ];  Heme/Lymph: bruising[  ];  bleeding[  ];  anemia[  ];  Neuro: TIA[  ];  headaches[  ];  stroke[y  ];  vertigo[  ];  seizures[  ];   paresthesias[  ];  difficulty walking[  ];  Psych:depression[  ]; anxiety[  ];  Endocrine: diabetes[ n ];  thyroid dysfunction[  ];  Immunizations: Flu [  ]; Pneumococcal[  ];  Other:  Physical Exam: BP 148/94 mmHg  Pulse 84  Temp(Src) 97.9 F (36.6 C) (Oral)  Resp 18  Ht 5\' 8"  (1.727 m)  Wt 199 lb 2 oz (90.323 kg)  BMI 30.28 kg/m2  SpO2 92%  General appearance: alert, cooperative and no distress Head: Normocephalic, without obvious abnormality, atraumatic Neck: no adenopathy, no carotid bruit, no JVD, supple, symmetrical, trachea midline, thyroid not enlarged, symmetric, no tenderness/mass/nodules and Right CEA scar present Lymph nodes: Cervical, supraclavicular, and axillary nodes normal. Resp: clear to auscultation bilaterally Cardio: regular rate and rhythm GI: soft, non-tender; bowel sounds normal; no masses,  no organomegaly Extremities: extremities normal, atraumatic, no cyanosis or edema Neurologic: Grossly normal Palpable dp and pt pulses bilaterally   Diagnostic Studies & Laboratory data:     Recent Radiology Findings:   Dg Chest 2  View  01/31/2016  CLINICAL DATA:  Preoperative assessment for heart catheterization on 02/01/2016, shortness of breath with exertion for few weeks, hypertension, stroke, atherosclerotic disease EXAM: CHEST  2 VIEW COMPARISON:  04/11/2015 FINDINGS: Borderline enlargement of cardiac silhouette. Atherosclerotic calcification aorta. Mediastinal contours and pulmonary vascularity normal. Calcifications project over LEFT upper lobe question calcified granulomata versus pleural calcifications. New triangular density in the mid LEFT lung could represent a calcified pleural plaque but pulmonary mass not excluded, area in question approximately 2.5 x 2.1 cm. Bibasilar atelectasis. No definite infiltrate, pleural effusion or pneumothorax. IMPRESSION: Bibasilar atelectasis. Calcified pleural disease versus calcified granulomata in RIGHT upper lobe with an additional LEFT mid lung density 2.5 x 2.1 cm, potentially a calcified pleural plaque though pulmonary nodules not excluded; this area at is more prominent than on the previous exam and further assessment by computed tomography is recommended to exclude pulmonary neoplasm. These results will be called to the ordering clinician or representative by the Radiologist Assistant, and communication documented in the PACS or zVision Dashboard. Electronically Signed   By: Lavonia Dana M.D.   On: 01/31/2016 14:53     I have independently reviewed the above radiologic studies.  Recent Lab Findings: Lab Results  Component Value Date   WBC 6.6 01/31/2016   HGB 15.1 01/31/2016   HCT 44.7 01/31/2016   PLT 211 01/31/2016   GLUCOSE 108* 01/31/2016   CHOL 181 04/12/2015   TRIG 126 04/12/2015   HDL 26* 04/12/2015   LDLCALC 130* 04/12/2015   ALT 31 04/19/2015   AST 24 04/19/2015   NA 137 01/31/2016   K 3.9 01/31/2016   CL 102 01/31/2016   CREATININE 1.01 01/31/2016   BUN 9 01/31/2016   CO2 28 01/31/2016   TSH 2.55 01/31/2016   INR 1.02 01/31/2016   HGBA1C 5.9* 04/12/2015    CAth:  Left Heart Cath and Coronary Angiography    Conclusion     Lat Ramus lesion, 40% stenosed.  Ramus lesion, 90% stenosed.  Ost LAD to Mid LAD lesion, 80% stenosed.  Ost 1st Diag to 1st Diag lesion, 90% stenosed.  Dist LAD lesion, 80% stenosed.  Ost LM to LM lesion, 50% stenosed.  Mid RCA to Dist RCA lesion, 100% stenosed.  Ost RCA to Mid RCA lesion, 80% stenosed.  Prox Cx lesion, 30% stenosed.  The left ventricular systolic function is normal.  1. Severe triple vessel CAD 2. Normal LV systolic function 3. Unstable angina  Recommendations: Will admit to telemetry. Will ask CT surgery to see to discuss CABG. Continue ASA. He is statin intolerant. Will start beta blocker    I have independently reviewed the above  cath films and reviewed the findings with the  patient .  Echo: LV EF: 60% - 65%  ------------------------------------------------------------------- Study Conclusions  - Left ventricle: The cavity size was normal. There was moderate  concentric hypertrophy. Systolic function was normal. The  estimated ejection fraction was in the range of 60% to 65%. Wall  motion was normal; there were no regional wall motion  abnormalities. Doppler parameters are consistent with abnormal  left ventricular relaxation (grade 1 diastolic dysfunction).  Doppler parameters are consistent with elevated ventricular  end-diastolic filling pressure. - Aortic valve: Possibly bicuspid; moderately thickened, moderately  calcified leaflets. There was mild regurgitation. Peak gradient  (S): 14 mm Hg. Mean gradient 7 mmHg. Valve area (VTI): 2.35 cm^2.  Valve area (Vmax): 2.2 cm^2. Valve area (Vmean): 2.16 cm^2. - Aortic root: The aortic root was normal in size. - Ascending aorta: The ascending aorta was not visualized. - Mitral valve: Structurally normal valve. There was mild  regurgitation. - Left atrium: The atrium was mildly dilated. - Right ventricle:  Systolic function was normal. - Right atrium: The atrium was normal in size. - Tricuspid valve: There was trivial regurgitation. - Pulmonary arteries: Systolic pressure was within the normal  range. - Inferior vena cava: The vessel was normal in size. - Pericardium, extracardiac: There was no pericardial effusion.  Transthoracic echocardiography. M-mode, complete 2D, spectral Doppler, and color Doppler. Birthdate: Patient birthdate: 10/03/1940. Age: Patient is 75 yr old. Sex: Gender: male. BMI: 30.3 kg/m^2. Blood pressure: 124/76 Patient status: Inpatient. Study date: Study date: 02/01/2016. Study time: 02:56 PM.  -------------------------------------------------------------------  ------------------------------------------------------------------- Left ventricle: The cavity size was normal. There was moderate concentric hypertrophy. Systolic function was normal. The estimated ejection fraction was in the range of 60% to 65%. Wall motion was normal; there were no regional wall motion abnormalities. Doppler parameters are consistent with abnormal left ventricular relaxation (grade 1 diastolic dysfunction). Doppler parameters are consistent with elevated ventricular end-diastolic filling pressure.  ------------------------------------------------------------------- Aortic valve: Possibly bicuspid; moderately thickened, moderately calcified leaflets. Mobility was not restricted. Doppler: Transvalvular velocity was minimally increased. There was no stenosis. There was mild regurgitation. VTI ratio of LVOT to aortic valve: 0.57. Valve area (VTI): 2.35 cm^2. Indexed valve area (VTI): 1.12 cm^2/m^2. Peak velocity ratio of LVOT to aortic valve: 0.53. Valve area (Vmax): 2.2 cm^2. Indexed valve area (Vmax): 1.04 cm^2/m^2. Mean velocity ratio of LVOT to aortic valve: 0.52. Valve area (Vmean): 2.16 cm^2. Indexed valve area (Vmean): 1.02 cm^2/m^2.  Peak gradient  (S): 14 mm Hg. Mean gradient 7 mmHg.  ------------------------------------------------------------------- Aorta: Aortic root: The aortic root was normal in size. Ascending aorta: The ascending aorta was not visualized.  ------------------------------------------------------------------- Mitral valve: Structurally normal valve. Mobility was not restricted. Doppler: Transvalvular velocity was within the normal range. There was no evidence for stenosis. There was mild regurgitation. Peak gradient (D): 4  mm Hg.  ------------------------------------------------------------------- Left atrium: The atrium was mildly dilated.  ------------------------------------------------------------------- Right ventricle: The cavity size was normal. Wall thickness was normal. Systolic function was normal.  ------------------------------------------------------------------- Pulmonic valve: Doppler: Transvalvular velocity was within the normal range. There was no evidence for stenosis. There was mild regurgitation.  ------------------------------------------------------------------- Tricuspid valve: Structurally normal valve. Doppler: Transvalvular velocity was within the normal range. There was trivial regurgitation.  ------------------------------------------------------------------- Pulmonary artery: The main pulmonary artery was normal-sized. Systolic pressure was within the normal range.  ------------------------------------------------------------------- Right atrium: The atrium was normal in size.  ------------------------------------------------------------------- Pericardium: There was no pericardial effusion.  ------------------------------------------------------------------- Systemic veins: Inferior vena cava: The vessel was normal in size.  ------------------------------------------------------------------- Measurements  Left  ventricle Value Reference LV ID, ED, PLAX chordal (L) 37.7 mm 43 - 52 LV ID, ES, PLAX chordal 25.2 mm 23 - 38 LV fx shortening, PLAX chordal 33 % >=29 LV PW thickness, ED 13.2 mm --------- IVS/LV PW ratio, ED 1.01 <=1.3 Stroke volume, 2D 98 ml --------- Stroke volume/bsa, 2D 47 ml/m^2 --------- LV ejection fraction, 1-p A4C 60 % --------- LV end-diastolic volume, 2-p 65 ml --------- LV end-systolic volume, 2-p 29 ml --------- LV ejection fraction, 2-p 56 % --------- Stroke volume, 2-p 36 ml --------- LV end-diastolic volume/bsa, 2-p 31 ml/m^2 --------- LV end-systolic volume/bsa, 2-p 14 ml/m^2 --------- Stroke volume/bsa, 2-p 17.1 ml/m^2 --------- LV e&', lateral 5.77 cm/s --------- LV E/e&', lateral 17.31 --------- LV e&', medial 3.92 cm/s --------- LV E/e&', medial 25.48 --------- LV e&', average 4.85 cm/s --------- LV E/e&', average 20.62 ---------  Ventricular septum Value Reference IVS thickness, ED 13.3 mm ---------  LVOT Value Reference LVOT ID, S 23 mm --------- LVOT area 4.15 cm^2 --------- LVOT peak velocity, S 97.5 cm/s --------- LVOT mean velocity,  S 64 cm/s --------- LVOT VTI, S 23.6 cm ---------  Aortic valve Value Reference Aortic valve peak velocity, S 184 cm/s --------- Aortic valve mean velocity, S 123 cm/s --------- Aortic valve VTI, S 41.6 cm --------- Aortic peak gradient, S 14 mm Hg --------- VTI ratio, LVOT/AV 0.57 --------- Aortic valve area, VTI 2.35 cm^2 --------- Aortic valve area/bsa, VTI 1.12 cm^2/m^2 --------- Velocity ratio, peak, LVOT/AV 0.53 --------- Aortic valve area, peak velocity 2.2 cm^2 --------- Aortic valve area/bsa, peak 1.04 cm^2/m^2 --------- velocity Velocity ratio, mean, LVOT/AV 0.52 --------- Aortic valve area, mean velocity 2.16 cm^2 --------- Aortic valve area/bsa, mean 1.02 cm^2/m^2 --------- velocity  Aorta Value Reference Aortic root ID, ED 36 mm ---------  Left atrium Value Reference LA ID, A-P, ES 36 mm --------- LA ID/bsa, A-P 1.71 cm/m^2 <=2.2 LA volume, S 72 ml --------- LA volume/bsa, S 34.2 ml/m^2 --------- LA volume, ES, 1-p A4C 68.4 ml --------- LA volume/bsa, ES, 1-p A4C 32.5 ml/m^2 --------- LA volume, ES, 1-p A2C 63.8 ml --------- LA volume/bsa, ES, 1-p A2C 30.3 ml/m^2 ---------  Mitral valve Value  Reference Mitral E-wave peak velocity 99.9 cm/s --------- Mitral A-wave peak velocity 125 cm/s --------- Mitral deceleration time (H) 261 ms 150 - 230 Mitral peak gradient, D 4 mm Hg --------- Mitral E/A ratio, peak 0.8 ---------  Right ventricle Value Reference TAPSE 18.9 mm ---------  Legend: (L) and (H) mark values outside specified reference range.  ------------------------------------------------------------------- Prepared and Electronically Authenticated by  Ena Dawley, M.D. 2017-05-09T16:35:37  Assessment / Plan:      1. CAD- symptomatic with minimal activity  2. HTN- stopped taking home medications,  Pressure elevated here on Lopressor and Lisionpril 3. Hyperlipidemia- statin intolerant 4. H/O Right CEA,  per patient duplex with residual 40% stenosis on the left 5. Dispo- patient stable, plan for CABG in am, NPO at midnight 6. Abnormal chest xray- will get ct of chest no contrast tonight   I have recommended preceding with cabg with symptomatic angina and 3 vessel CAD The goals risks and alternatives of the planned surgical procedure CABG  have been discussed with the patient in detail. The risks of the procedure including death, infection, stroke, myocardial infarction, bleeding, blood transfusion have all been discussed specifically.  I have quoted Shanna Cisco a 3 % of perioperative mortality and a complication rate as high as 40%. [name] questions have been answered.Raj A [place]ckown is willing  to proceed with the planned procedure.   In addition to other potential risks and complications from the surgery, I have made the patient aware of the recent [place] concerning the risk of infection by Myocobacterium chimaera related to the use of  [employer] 3T heater-cooler equipment during cardiac surgery. I discussed with the patient the low risk of infection, as well as our compliance with the most current FDA recommendations to minimize infection and testing of all devices for contamination. The patient has been made aware of the limited alternatives to immediately replacing the current equipment. The patient has been informed regarding the risks associated with waiting to proceed with needed surgery and that such risks are greater than the risk of infection related to the use of the heater-cooler device. I did make the patient aware that after careful review of the patients having cardiac surgery at [facility] we have no evidence that heater/cooler related infections have occurred at [facility]. We discussed that this is a slow-growing bacterium, such that it can take some period of time for symptoms to develop.   I  spent 40 minutes counseling the patient face to face and 50% or more the  time was spent in counseling and coordination of care. The total time spent in the appointment was 60 minutes.   Grace Isaac MD      [address] [place] [address] Office [phone]   [place]

## 2016-02-02 ENCOUNTER — Inpatient Hospital Stay (HOSPITAL_COMMUNITY): Payer: Medicare Other

## 2016-02-02 ENCOUNTER — Inpatient Hospital Stay (HOSPITAL_COMMUNITY): Payer: Medicare Other | Admitting: Certified Registered"

## 2016-02-02 ENCOUNTER — Encounter (HOSPITAL_COMMUNITY)
Admission: RE | Disposition: A | Discharge status: Home / Self Care | Payer: Self-pay | Source: Ambulatory Visit | Attending: Cardiothoracic Surgery

## 2016-02-02 ENCOUNTER — Encounter (HOSPITAL_COMMUNITY): Payer: Self-pay | Admitting: Certified Registered"

## 2016-02-02 DIAGNOSIS — Z951 Presence of aortocoronary bypass graft: Secondary | ICD-10-CM

## 2016-02-02 HISTORY — PX: TEE WITHOUT CARDIOVERSION: SHX5443

## 2016-02-02 HISTORY — PX: CORONARY ARTERY BYPASS GRAFT: SHX141

## 2016-02-02 LAB — GLUCOSE, CAPILLARY
GLUCOSE-CAPILLARY: 100 mg/dL — AB (ref 65–99)
GLUCOSE-CAPILLARY: 107 mg/dL — AB (ref 65–99)
Glucose-Capillary: 98 mg/dL (ref 65–99)

## 2016-02-02 LAB — POCT I-STAT, CHEM 8
BUN: 9 mg/dL (ref 6–20)
BUN: 8 mg/dL (ref 6–20)
BUN: 9 mg/dL (ref 6–20)
BUN: 8 mg/dL (ref 6–20)
BUN: 8 mg/dL (ref 6–20)
BUN: 8 mg/dL (ref 6–20)
BUN: 9 mg/dL (ref 6–20)
CALCIUM ION: 1.02 mmol/L — AB (ref 1.13–1.30)
CALCIUM ION: 1.12 mmol/L — AB (ref 1.13–1.30)
CALCIUM ION: 1.17 mmol/L (ref 1.13–1.30)
CHLORIDE: 100 mmol/L — AB (ref 101–111)
CHLORIDE: 99 mmol/L — AB (ref 101–111)
CHLORIDE: 100 mmol/L — AB (ref 101–111)
CHLORIDE: 105 mmol/L (ref 101–111)
CREATININE: 0.8 mg/dL (ref 0.61–1.24)
CREATININE: 0.7 mg/dL (ref 0.61–1.24)
CREATININE: 0.6 mg/dL — AB (ref 0.61–1.24)
CREATININE: 0.8 mg/dL (ref 0.61–1.24)
Calcium, Ion: 1.24 mmol/L (ref 1.13–1.30)
Calcium, Ion: 1.15 mmol/L (ref 1.13–1.30)
Calcium, Ion: 0.98 mmol/L — ABNORMAL LOW (ref 1.13–1.30)
Calcium, Ion: 1 mmol/L — ABNORMAL LOW (ref 1.13–1.30)
Chloride: 102 mmol/L (ref 101–111)
Chloride: 102 mmol/L (ref 101–111)
Chloride: 98 mmol/L — ABNORMAL LOW (ref 101–111)
Creatinine, Ser: 0.6 mg/dL — ABNORMAL LOW (ref 0.61–1.24)
Creatinine, Ser: 0.7 mg/dL (ref 0.61–1.24)
Creatinine, Ser: 0.7 mg/dL (ref 0.61–1.24)
GLUCOSE: 118 mg/dL — AB (ref 65–99)
GLUCOSE: 108 mg/dL — AB (ref 65–99)
GLUCOSE: 121 mg/dL — AB (ref 65–99)
GLUCOSE: 152 mg/dL — AB (ref 65–99)
GLUCOSE: 145 mg/dL — AB (ref 65–99)
Glucose, Bld: 117 mg/dL — ABNORMAL HIGH (ref 65–99)
Glucose, Bld: 171 mg/dL — ABNORMAL HIGH (ref 65–99)
HCT: 33 % — ABNORMAL LOW (ref 39.0–52.0)
HCT: 40 % (ref 39.0–52.0)
HCT: 36 % — ABNORMAL LOW (ref 39.0–52.0)
HEMATOCRIT: 43 % (ref 39.0–52.0)
HEMATOCRIT: 30 % — AB (ref 39.0–52.0)
HEMATOCRIT: 32 % — AB (ref 39.0–52.0)
HEMATOCRIT: 35 % — AB (ref 39.0–52.0)
HEMOGLOBIN: 13.6 g/dL (ref 13.0–17.0)
HEMOGLOBIN: 10.2 g/dL — AB (ref 13.0–17.0)
HEMOGLOBIN: 10.9 g/dL — AB (ref 13.0–17.0)
Hemoglobin: 14.6 g/dL (ref 13.0–17.0)
Hemoglobin: 11.2 g/dL — ABNORMAL LOW (ref 13.0–17.0)
Hemoglobin: 11.9 g/dL — ABNORMAL LOW (ref 13.0–17.0)
Hemoglobin: 12.2 g/dL — ABNORMAL LOW (ref 13.0–17.0)
POTASSIUM: 5 mmol/L (ref 3.5–5.1)
POTASSIUM: 5.4 mmol/L — AB (ref 3.5–5.1)
POTASSIUM: 4.1 mmol/L (ref 3.5–5.1)
POTASSIUM: 4.3 mmol/L (ref 3.5–5.1)
Potassium: 4.3 mmol/L (ref 3.5–5.1)
Potassium: 4.6 mmol/L (ref 3.5–5.1)
Potassium: 5.6 mmol/L — ABNORMAL HIGH (ref 3.5–5.1)
SODIUM: 139 mmol/L (ref 135–145)
Sodium: 140 mmol/L (ref 135–145)
Sodium: 139 mmol/L (ref 135–145)
Sodium: 139 mmol/L (ref 135–145)
Sodium: 135 mmol/L (ref 135–145)
Sodium: 136 mmol/L (ref 135–145)
Sodium: 136 mmol/L (ref 135–145)
TCO2: 30 mmol/L (ref 0–100)
TCO2: 29 mmol/L (ref 0–100)
TCO2: 29 mmol/L (ref 0–100)
TCO2: 30 mmol/L (ref 0–100)
TCO2: 27 mmol/L (ref 0–100)
TCO2: 25 mmol/L (ref 0–100)
TCO2: 22 mmol/L (ref 0–100)

## 2016-02-02 LAB — CBC
HCT: 36.5 % — ABNORMAL LOW (ref 39.0–52.0)
HEMATOCRIT: 41.7 % (ref 39.0–52.0)
HEMATOCRIT: 38 % — AB (ref 39.0–52.0)
HEMOGLOBIN: 13.9 g/dL (ref 13.0–17.0)
HEMOGLOBIN: 12.7 g/dL — AB (ref 13.0–17.0)
Hemoglobin: 12.3 g/dL — ABNORMAL LOW (ref 13.0–17.0)
MCH: 28.9 pg (ref 26.0–34.0)
MCH: 28.9 pg (ref 26.0–34.0)
MCH: 29.7 pg (ref 26.0–34.0)
MCHC: 33.3 g/dL (ref 30.0–36.0)
MCHC: 33.4 g/dL (ref 30.0–36.0)
MCHC: 33.7 g/dL (ref 30.0–36.0)
MCV: 86.7 fL (ref 78.0–100.0)
MCV: 86.6 fL (ref 78.0–100.0)
MCV: 88.2 fL (ref 78.0–100.0)
PLATELETS: 150 10*3/uL (ref 150–400)
Platelets: 213 10*3/uL (ref 150–400)
Platelets: 155 10*3/uL (ref 150–400)
RBC: 4.81 MIL/uL (ref 4.22–5.81)
RBC: 4.39 MIL/uL (ref 4.22–5.81)
RBC: 4.14 MIL/uL — ABNORMAL LOW (ref 4.22–5.81)
RDW: 14.3 % (ref 11.5–15.5)
RDW: 14.1 % (ref 11.5–15.5)
RDW: 14.4 % (ref 11.5–15.5)
WBC: 6.1 10*3/uL (ref 4.0–10.5)
WBC: 13.8 10*3/uL — AB (ref 4.0–10.5)
WBC: 11.9 10*3/uL — ABNORMAL HIGH (ref 4.0–10.5)

## 2016-02-02 LAB — POCT I-STAT 3, ART BLOOD GAS (G3+)
ACID-BASE EXCESS: 4 mmol/L — AB (ref 0.0–2.0)
ACID-BASE EXCESS: 1 mmol/L (ref 0.0–2.0)
Acid-base deficit: 5 mmol/L — ABNORMAL HIGH (ref 0.0–2.0)
Acid-base deficit: 4 mmol/L — ABNORMAL HIGH (ref 0.0–2.0)
BICARBONATE: 28.6 meq/L — AB (ref 20.0–24.0)
BICARBONATE: 24.9 meq/L — AB (ref 20.0–24.0)
BICARBONATE: 20.9 meq/L (ref 20.0–24.0)
BICARBONATE: 22.1 meq/L (ref 20.0–24.0)
O2 SAT: 98 %
O2 SAT: 93 %
O2 SAT: 91 %
O2 Saturation: 100 %
PCO2 ART: 40.2 mmHg (ref 35.0–45.0)
PH ART: 7.435 (ref 7.350–7.450)
PH ART: 7.324 — AB (ref 7.350–7.450)
PO2 ART: 406 mmHg — AB (ref 80.0–100.0)
PO2 ART: 105 mmHg — AB (ref 80.0–100.0)
PO2 ART: 72 mmHg — AB (ref 80.0–100.0)
Patient temperature: 36.7
Patient temperature: 37.6
TCO2: 30 mmol/L (ref 0–100)
TCO2: 26 mmol/L (ref 0–100)
TCO2: 22 mmol/L (ref 0–100)
TCO2: 23 mmol/L (ref 0–100)
pCO2 arterial: 44.4 mmHg (ref 35.0–45.0)
pCO2 arterial: 36.9 mmHg (ref 35.0–45.0)
pCO2 arterial: 42.8 mmHg (ref 35.0–45.0)
pH, Arterial: 7.416 (ref 7.350–7.450)
pH, Arterial: 7.326 — ABNORMAL LOW (ref 7.350–7.450)
pO2, Arterial: 67 mmHg — ABNORMAL LOW (ref 80.0–100.0)

## 2016-02-02 LAB — CREATININE, SERUM
Creatinine, Ser: 0.87 mg/dL (ref 0.61–1.24)
GFR calc Af Amer: >60 mL/min (ref >60)
GFR calc non Af Amer: >60 mL/min (ref >60)

## 2016-02-02 LAB — BASIC METABOLIC PANEL
Anion gap: 11 (ref 5–15)
BUN: 8 mg/dL (ref 6–20)
CHLORIDE: 106 mmol/L (ref 101–111)
CO2: 24 mmol/L (ref 22–32)
CREATININE: 1.01 mg/dL (ref 0.61–1.24)
Calcium: 8.9 mg/dL (ref 8.9–10.3)
GFR calc Af Amer: >60 mL/min (ref >60)
GFR calc non Af Amer: >60 mL/min (ref >60)
GLUCOSE: 146 mg/dL — AB (ref 65–99)
Potassium: 3.6 mmol/L (ref 3.5–5.1)
Sodium: 141 mmol/L (ref 135–145)

## 2016-02-02 LAB — POCT I-STAT 4, (NA,K, GLUC, HGB,HCT)
GLUCOSE: 125 mg/dL — AB (ref 65–99)
HCT: 39 % (ref 39.0–52.0)
Hemoglobin: 13.3 g/dL (ref 13.0–17.0)
POTASSIUM: 3.9 mmol/L (ref 3.5–5.1)
Sodium: 139 mmol/L (ref 135–145)

## 2016-02-02 LAB — PROTIME-INR
INR: 1.41 (ref 0.00–1.49)
Prothrombin Time: 17.3 seconds — ABNORMAL HIGH (ref 11.6–15.2)

## 2016-02-02 LAB — MAGNESIUM: Magnesium: 2.8 mg/dL — ABNORMAL HIGH (ref 1.7–2.4)

## 2016-02-02 LAB — PLATELET COUNT: Platelets: 157 10*3/uL (ref 150–400)

## 2016-02-02 LAB — HEMOGLOBIN AND HEMATOCRIT, BLOOD
HCT: 31.4 % — ABNORMAL LOW (ref 39.0–52.0)
Hemoglobin: 10.6 g/dL — ABNORMAL LOW (ref 13.0–17.0)

## 2016-02-02 LAB — APTT: APTT: 32 s (ref 24–37)

## 2016-02-02 SURGERY — CORONARY ARTERY BYPASS GRAFTING (CABG)
Anesthesia: General | Site: Chest

## 2016-02-02 MED ORDER — DEXMEDETOMIDINE HCL IN NACL 200 MCG/50ML IV SOLN
0.0000 ug/kg/h | INTRAVENOUS | Status: DC
  Filled 2016-02-02: qty 50

## 2016-02-02 MED ORDER — FENTANYL CITRATE (PF) 250 MCG/5ML IJ SOLN
INTRAMUSCULAR | Status: AC
  Filled 2016-02-02: qty 5

## 2016-02-02 MED ORDER — NITROGLYCERIN IN D5W 200-5 MCG/ML-% IV SOLN: 0.0000 ug/min | INTRAVENOUS | Status: DC

## 2016-02-02 MED ORDER — SUCCINYLCHOLINE CHLORIDE 200 MG/10ML IV SOSY
PREFILLED_SYRINGE | INTRAVENOUS | Status: AC
  Filled 2016-02-02: qty 10

## 2016-02-02 MED ORDER — LACTATED RINGERS IV SOLN
INTRAVENOUS | Status: DC | PRN
  Administered 2016-02-02 (×4): via INTRAVENOUS

## 2016-02-02 MED ORDER — CHLORHEXIDINE GLUCONATE 0.12% ORAL RINSE (MEDLINE KIT)
15.0000 mL | Freq: Two times a day (BID) | OROMUCOSAL | Status: DC
  Administered 2016-02-02 – 2016-02-03 (×2): 15 mL via OROMUCOSAL

## 2016-02-02 MED ORDER — ACETAMINOPHEN 160 MG/5ML PO SOLN: 1000.0000 mg | Freq: Four times a day (QID) | ORAL | Status: DC

## 2016-02-02 MED ORDER — HEPARIN SODIUM (PORCINE) 1000 UNIT/ML IJ SOLN
INTRAMUSCULAR | Status: DC | PRN
  Administered 2016-02-02: 35000 [IU] via INTRAVENOUS

## 2016-02-02 MED ORDER — OXYCODONE HCL 5 MG PO TABS
5.0000 mg | ORAL_TABLET | ORAL | Status: DC | PRN
  Administered 2016-02-03 (×2): 10 mg via ORAL
  Administered 2016-02-03: 5 mg via ORAL
  Filled 2016-02-02: qty 2
  Filled 2016-02-02: qty 1
  Filled 2016-02-02: qty 2

## 2016-02-02 MED ORDER — INSULIN REGULAR BOLUS VIA INFUSION
0.0000 [IU] | Freq: Three times a day (TID) | INTRAVENOUS | Status: DC
  Filled 2016-02-02: qty 10

## 2016-02-02 MED ORDER — ASPIRIN EC 325 MG PO TBEC
325.0000 mg | DELAYED_RELEASE_TABLET | Freq: Every day | ORAL | Status: DC
  Administered 2016-02-03 – 2016-02-04 (×2): 325 mg via ORAL
  Filled 2016-02-02 (×2): qty 1

## 2016-02-02 MED ORDER — ROCURONIUM BROMIDE 100 MG/10ML IV SOLN
INTRAVENOUS | Status: DC | PRN
  Administered 2016-02-02: 40 mg via INTRAVENOUS
  Administered 2016-02-02: 60 mg via INTRAVENOUS

## 2016-02-02 MED ORDER — DOCUSATE SODIUM 100 MG PO CAPS
200.0000 mg | ORAL_CAPSULE | Freq: Every day | ORAL | Status: DC
  Administered 2016-02-03: 200 mg via ORAL
  Filled 2016-02-02 (×2): qty 2

## 2016-02-02 MED ORDER — SODIUM CHLORIDE 0.9% FLUSH: 3.0000 mL | INTRAVENOUS | Status: DC | PRN

## 2016-02-02 MED ORDER — CALCIUM CHLORIDE 10 % IV SOLN
INTRAVENOUS | Status: DC | PRN
  Administered 2016-02-02: 200 mg via INTRAVENOUS
  Administered 2016-02-02 (×2): 100 mg via INTRAVENOUS

## 2016-02-02 MED ORDER — VECURONIUM BROMIDE 10 MG IV SOLR
INTRAVENOUS | Status: DC | PRN
  Administered 2016-02-02 (×2): 5 mg via INTRAVENOUS
  Administered 2016-02-02: 4 mg via INTRAVENOUS

## 2016-02-02 MED ORDER — SODIUM CHLORIDE 0.45 % IV SOLN: INTRAVENOUS | Status: DC | PRN

## 2016-02-02 MED ORDER — POTASSIUM CHLORIDE 10 MEQ/50ML IV SOLN
10.0000 meq | INTRAVENOUS | Status: AC
  Administered 2016-02-02 (×3): 10 meq via INTRAVENOUS

## 2016-02-02 MED ORDER — SODIUM CHLORIDE 0.9 % IV SOLN
INTRAVENOUS | Status: DC
  Administered 2016-02-02: 20 mL/h via INTRAVENOUS

## 2016-02-02 MED ORDER — VANCOMYCIN HCL IN DEXTROSE 1-5 GM/200ML-% IV SOLN
1000.0000 mg | Freq: Once | INTRAVENOUS | Status: AC
  Administered 2016-02-02: 1000 mg via INTRAVENOUS
  Filled 2016-02-02: qty 200

## 2016-02-02 MED ORDER — LACTATED RINGERS IV SOLN: INTRAVENOUS | Status: DC

## 2016-02-02 MED ORDER — MORPHINE SULFATE (PF) 2 MG/ML IV SOLN: 1.0000 mg | INTRAVENOUS | Status: AC | PRN

## 2016-02-02 MED ORDER — ARTIFICIAL TEARS OP OINT
TOPICAL_OINTMENT | OPHTHALMIC | Status: DC | PRN
  Administered 2016-02-02: 1 via OPHTHALMIC

## 2016-02-02 MED ORDER — PHENYLEPHRINE 40 MCG/ML (10ML) SYRINGE FOR IV PUSH (FOR BLOOD PRESSURE SUPPORT)
PREFILLED_SYRINGE | INTRAVENOUS | Status: AC
  Filled 2016-02-02: qty 10

## 2016-02-02 MED ORDER — ACETAMINOPHEN 160 MG/5ML PO SOLN: 650.0000 mg | Freq: Once | ORAL | Status: AC

## 2016-02-02 MED ORDER — ROCURONIUM BROMIDE 50 MG/5ML IV SOLN
INTRAVENOUS | Status: AC
  Filled 2016-02-02: qty 2

## 2016-02-02 MED ORDER — FAMOTIDINE IN NACL 20-0.9 MG/50ML-% IV SOLN
20.0000 mg | Freq: Two times a day (BID) | INTRAVENOUS | Status: AC
  Administered 2016-02-02: 20 mg via INTRAVENOUS

## 2016-02-02 MED ORDER — METOPROLOL TARTRATE 5 MG/5ML IV SOLN
2.5000 mg | INTRAVENOUS | Status: DC | PRN
  Administered 2016-02-04: 2.5 mg via INTRAVENOUS
  Filled 2016-02-02: qty 5

## 2016-02-02 MED ORDER — SODIUM CHLORIDE 0.9 % IJ SOLN
INTRAMUSCULAR | Status: AC
  Filled 2016-02-02: qty 10

## 2016-02-02 MED ORDER — FENTANYL CITRATE (PF) 250 MCG/5ML IJ SOLN
INTRAMUSCULAR | Status: DC | PRN
  Administered 2016-02-02: 25 ug via INTRAVENOUS
  Administered 2016-02-02: 100 ug via INTRAVENOUS
  Administered 2016-02-02: 50 ug via INTRAVENOUS
  Administered 2016-02-02: 150 ug via INTRAVENOUS
  Administered 2016-02-02: 50 ug via INTRAVENOUS
  Administered 2016-02-02 (×4): 100 ug via INTRAVENOUS
  Administered 2016-02-02: 125 ug via INTRAVENOUS
  Administered 2016-02-02: 50 ug via INTRAVENOUS
  Administered 2016-02-02: 150 ug via INTRAVENOUS
  Administered 2016-02-02: 50 ug via INTRAVENOUS
  Administered 2016-02-02: 100 ug via INTRAVENOUS

## 2016-02-02 MED ORDER — SODIUM CHLORIDE 0.9 % IV SOLN: 250.0000 mL | INTRAVENOUS | Status: DC

## 2016-02-02 MED ORDER — HEPARIN SODIUM (PORCINE) 1000 UNIT/ML IJ SOLN
INTRAMUSCULAR | Status: AC
  Filled 2016-02-02: qty 1

## 2016-02-02 MED ORDER — METOPROLOL TARTRATE 12.5 MG HALF TABLET
12.5000 mg | ORAL_TABLET | Freq: Two times a day (BID) | ORAL | Status: DC
  Administered 2016-02-03 (×2): 12.5 mg via ORAL
  Filled 2016-02-02 (×2): qty 1

## 2016-02-02 MED ORDER — PROPOFOL 10 MG/ML IV BOLUS
INTRAVENOUS | Status: AC
  Filled 2016-02-02: qty 20

## 2016-02-02 MED ORDER — SODIUM CHLORIDE 0.9 % IJ SOLN
INTRAMUSCULAR | Status: DC | PRN
  Administered 2016-02-02 (×3): via TOPICAL

## 2016-02-02 MED ORDER — SODIUM CHLORIDE 0.9 % IV SOLN
0.5000 g/h | INTRAVENOUS | Status: AC
  Administered 2016-02-02: 1 g/h via INTRAVENOUS
  Filled 2016-02-02: qty 20

## 2016-02-02 MED ORDER — PANTOPRAZOLE SODIUM 40 MG PO TBEC
40.0000 mg | DELAYED_RELEASE_TABLET | Freq: Every day | ORAL | Status: DC
  Filled 2016-02-02: qty 1

## 2016-02-02 MED ORDER — PROTAMINE SULFATE 10 MG/ML IV SOLN
INTRAVENOUS | Status: AC
Start: 2016-02-02 — End: 2016-02-02
  Filled 2016-02-02: qty 25

## 2016-02-02 MED ORDER — PROPOFOL 10 MG/ML IV BOLUS
INTRAVENOUS | Status: DC | PRN
  Administered 2016-02-02: 90 mg via INTRAVENOUS

## 2016-02-02 MED ORDER — MAGNESIUM SULFATE 4 GM/100ML IV SOLN
4.0000 g | Freq: Once | INTRAVENOUS | Status: AC
  Administered 2016-02-02: 4 g via INTRAVENOUS
  Filled 2016-02-02: qty 100

## 2016-02-02 MED ORDER — VECURONIUM BROMIDE 10 MG IV SOLR
INTRAVENOUS | Status: AC
  Filled 2016-02-02: qty 10

## 2016-02-02 MED ORDER — PROTAMINE SULFATE 10 MG/ML IV SOLN
INTRAVENOUS | Status: DC | PRN
  Administered 2016-02-02: 20 mg via INTRAVENOUS
  Administered 2016-02-02 (×3): 30 mg via INTRAVENOUS
  Administered 2016-02-02: 20 mg via INTRAVENOUS
  Administered 2016-02-02 (×5): 30 mg via INTRAVENOUS
  Administered 2016-02-02: 20 mg via INTRAVENOUS

## 2016-02-02 MED ORDER — TRAMADOL HCL 50 MG PO TABS
50.0000 mg | ORAL_TABLET | ORAL | Status: DC | PRN
  Administered 2016-02-03 – 2016-02-04 (×3): 100 mg via ORAL
  Filled 2016-02-02 (×3): qty 2

## 2016-02-02 MED ORDER — PHENYLEPHRINE HCL 10 MG/ML IJ SOLN
0.0000 ug/min | INTRAMUSCULAR | Status: DC
  Administered 2016-02-03: 10 ug/min via INTRAVENOUS
  Filled 2016-02-02: qty 2

## 2016-02-02 MED ORDER — ALLOPURINOL 100 MG PO TABS
100.0000 mg | ORAL_TABLET | Freq: Two times a day (BID) | ORAL | Status: DC
  Administered 2016-02-03 (×2): 100 mg via ORAL
  Filled 2016-02-02 (×3): qty 1

## 2016-02-02 MED ORDER — LACTATED RINGERS IV SOLN: 500.0000 mL | Freq: Once | INTRAVENOUS | Status: DC | PRN

## 2016-02-02 MED ORDER — MORPHINE SULFATE (PF) 2 MG/ML IV SOLN
2.0000 mg | INTRAVENOUS | Status: DC | PRN
  Administered 2016-02-02: 2 mg via INTRAVENOUS
  Filled 2016-02-02: qty 2

## 2016-02-02 MED ORDER — HEMOSTATIC AGENTS (NO CHARGE) OPTIME
TOPICAL | Status: DC | PRN
  Administered 2016-02-02: 1 via TOPICAL

## 2016-02-02 MED ORDER — MIDAZOLAM HCL 10 MG/2ML IJ SOLN
INTRAMUSCULAR | Status: AC
  Filled 2016-02-02: qty 2

## 2016-02-02 MED ORDER — DEXTROSE 5 % IV SOLN
1.5000 g | Freq: Two times a day (BID) | INTRAVENOUS | Status: AC
  Administered 2016-02-02 – 2016-02-04 (×4): 1.5 g via INTRAVENOUS
  Filled 2016-02-02 (×4): qty 1.5

## 2016-02-02 MED ORDER — CHLORHEXIDINE GLUCONATE 0.12 % MT SOLN
15.0000 mL | OROMUCOSAL | Status: AC
  Administered 2016-02-02: 15 mL via OROMUCOSAL
  Filled 2016-02-02: qty 15

## 2016-02-02 MED ORDER — EPHEDRINE 5 MG/ML INJ
INTRAVENOUS | Status: AC
  Filled 2016-02-02: qty 10

## 2016-02-02 MED ORDER — BISACODYL 10 MG RE SUPP: 10.0000 mg | Freq: Every day | RECTAL | Status: DC

## 2016-02-02 MED ORDER — MIDAZOLAM HCL 2 MG/2ML IJ SOLN
2.0000 mg | INTRAMUSCULAR | Status: DC | PRN
  Filled 2016-02-02: qty 2

## 2016-02-02 MED ORDER — FENTANYL CITRATE (PF) 250 MCG/5ML IJ SOLN
INTRAMUSCULAR | Status: AC
  Filled 2016-02-02: qty 20

## 2016-02-02 MED ORDER — ALBUMIN HUMAN 5 % IV SOLN
250.0000 mL | INTRAVENOUS | Status: AC | PRN
  Administered 2016-02-02 (×3): 250 mL via INTRAVENOUS
  Filled 2016-02-02: qty 250

## 2016-02-02 MED ORDER — SODIUM CHLORIDE 0.9% FLUSH
3.0000 mL | Freq: Two times a day (BID) | INTRAVENOUS | Status: DC
  Administered 2016-02-03: 10 mL via INTRAVENOUS
  Administered 2016-02-04: 3 mL via INTRAVENOUS

## 2016-02-02 MED ORDER — BISACODYL 5 MG PO TBEC
10.0000 mg | DELAYED_RELEASE_TABLET | Freq: Every day | ORAL | Status: DC
  Administered 2016-02-03: 10 mg via ORAL
  Filled 2016-02-02 (×2): qty 2

## 2016-02-02 MED ORDER — PROTAMINE SULFATE 10 MG/ML IV SOLN
INTRAVENOUS | Status: AC
  Filled 2016-02-02: qty 5

## 2016-02-02 MED ORDER — MIDAZOLAM HCL 5 MG/ML IJ SOLN
INTRAMUSCULAR | Status: DC | PRN
  Administered 2016-02-02: 2 mg via INTRAVENOUS
  Administered 2016-02-02: 1 mg via INTRAVENOUS
  Administered 2016-02-02: 2 mg via INTRAVENOUS
  Administered 2016-02-02 (×3): 1 mg via INTRAVENOUS

## 2016-02-02 MED ORDER — METOPROLOL TARTRATE 25 MG/10 ML ORAL SUSPENSION: 12.5000 mg | Freq: Two times a day (BID) | ORAL | Status: DC

## 2016-02-02 MED ORDER — CALCIUM CHLORIDE 10 % IV SOLN
INTRAVENOUS | Status: AC
  Filled 2016-02-02: qty 10

## 2016-02-02 MED ORDER — ONDANSETRON HCL 4 MG/2ML IJ SOLN
4.0000 mg | Freq: Four times a day (QID) | INTRAMUSCULAR | Status: DC | PRN
  Administered 2016-02-03 (×2): 4 mg via INTRAVENOUS
  Filled 2016-02-02 (×2): qty 2

## 2016-02-02 MED ORDER — ASPIRIN 81 MG PO CHEW: 324.0000 mg | CHEWABLE_TABLET | Freq: Every day | ORAL | Status: DC

## 2016-02-02 MED ORDER — LIDOCAINE 2% (20 MG/ML) 5 ML SYRINGE
INTRAMUSCULAR | Status: AC
  Filled 2016-02-02: qty 5

## 2016-02-02 MED ORDER — ACETAMINOPHEN 500 MG PO TABS
1000.0000 mg | ORAL_TABLET | Freq: Four times a day (QID) | ORAL | Status: DC
  Administered 2016-02-03 – 2016-02-04 (×6): 1000 mg via ORAL
  Filled 2016-02-02 (×5): qty 2

## 2016-02-02 MED ORDER — ACETAMINOPHEN 650 MG RE SUPP
650.0000 mg | Freq: Once | RECTAL | Status: AC
  Administered 2016-02-02: 650 mg via RECTAL

## 2016-02-02 MED ORDER — ANTISEPTIC ORAL RINSE SOLUTION (CORINZ)
7.0000 mL | Freq: Four times a day (QID) | OROMUCOSAL | Status: DC
  Administered 2016-02-03: 7 mL via OROMUCOSAL

## 2016-02-02 MED ORDER — INSULIN REGULAR HUMAN 100 UNIT/ML IJ SOLN
INTRAMUSCULAR | Status: DC
  Administered 2016-02-02: 2.2 [IU]/h via INTRAVENOUS
  Filled 2016-02-02: qty 2.5

## 2016-02-02 MED FILL — Sodium Bicarbonate IV Soln 8.4%: INTRAVENOUS | Qty: 50 | Status: AC

## 2016-02-02 MED FILL — Heparin Sodium (Porcine) Inj 1000 Unit/ML: INTRAMUSCULAR | Qty: 10 | Status: AC

## 2016-02-02 MED FILL — Heparin Sodium (Porcine) Inj 1000 Unit/ML: INTRAMUSCULAR | Qty: 30 | Status: AC

## 2016-02-02 MED FILL — Mannitol IV Soln 20%: INTRAVENOUS | Qty: 500 | Status: AC

## 2016-02-02 MED FILL — Lidocaine HCl IV Inj 20 MG/ML: INTRAVENOUS | Qty: 5 | Status: AC

## 2016-02-02 MED FILL — Magnesium Sulfate Inj 50%: INTRAMUSCULAR | Qty: 10 | Status: AC

## 2016-02-02 MED FILL — Potassium Chloride Inj 2 mEq/ML: INTRAVENOUS | Qty: 40 | Status: AC

## 2016-02-02 MED FILL — Electrolyte-R (PH 7.4) Solution: INTRAVENOUS | Qty: 5000 | Status: AC

## 2016-02-02 MED FILL — Sodium Chloride IV Soln 0.9%: INTRAVENOUS | Qty: 2000 | Status: AC

## 2016-02-02 SURGICAL SUPPLY — 68 items
BAG DECANTER FOR FLEXI CONT (MISCELLANEOUS) ×3 IMPLANT
BANDAGE ELASTIC 4 VELCRO ST LF (GAUZE/BANDAGES/DRESSINGS) ×3 IMPLANT
BANDAGE ELASTIC 6 VELCRO ST LF (GAUZE/BANDAGES/DRESSINGS) ×3 IMPLANT
BLADE STERNUM SYSTEM 6 (BLADE) ×3 IMPLANT
BLADE SURG 11 STRL SS (BLADE) ×3 IMPLANT
BNDG GAUZE ELAST 4 BULKY (GAUZE/BANDAGES/DRESSINGS) ×3 IMPLANT
CANISTER SUCTION 2500CC (MISCELLANEOUS) ×3 IMPLANT
CATH CPB KIT GERHARDT (MISCELLANEOUS) ×3 IMPLANT
CATH THORACIC 28FR (CATHETERS) ×3 IMPLANT
CLIP TI WIDE RED SMALL 24 (CLIP) ×3 IMPLANT
CRADLE DONUT ADULT HEAD (MISCELLANEOUS) ×3 IMPLANT
DERMABOND ADVANCED (GAUZE/BANDAGES/DRESSINGS) ×1
DERMABOND ADVANCED .7 DNX12 (GAUZE/BANDAGES/DRESSINGS) ×2 IMPLANT
DRAIN CHANNEL 28F RND 3/8 FF (WOUND CARE) ×3 IMPLANT
DRAPE CARDIOVASCULAR INCISE (DRAPES) ×1
DRAPE SLUSH/WARMER DISC (DRAPES) ×3 IMPLANT
DRAPE SRG 135X102X78XABS (DRAPES) ×2 IMPLANT
DRSG AQUACEL AG ADV 3.5X14 (GAUZE/BANDAGES/DRESSINGS) ×3 IMPLANT
ELECT BLADE 4.0 EZ CLEAN MEGAD (MISCELLANEOUS) ×3
ELECT REM PT RETURN 9FT ADLT (ELECTROSURGICAL) ×6
ELECTRODE BLDE 4.0 EZ CLN MEGD (MISCELLANEOUS) ×2 IMPLANT
ELECTRODE REM PT RTRN 9FT ADLT (ELECTROSURGICAL) ×4 IMPLANT
FELT TEFLON 1X6 (MISCELLANEOUS) ×3 IMPLANT
GAUZE SPONGE 4X4 12PLY STRL (GAUZE/BANDAGES/DRESSINGS) ×6 IMPLANT
GLOVE BIO SURGEON STRL SZ 6.5 (GLOVE) ×9 IMPLANT
GOWN STRL REUS W/ TWL LRG LVL3 (GOWN DISPOSABLE) ×8 IMPLANT
GOWN STRL REUS W/TWL LRG LVL3 (GOWN DISPOSABLE) ×4
HEMOSTAT POWDER SURGIFOAM 1G (HEMOSTASIS) ×9 IMPLANT
HEMOSTAT SURGICEL 2X14 (HEMOSTASIS) ×3 IMPLANT
KIT BASIN OR (CUSTOM PROCEDURE TRAY) ×3 IMPLANT
KIT CATH SUCT 8FR (CATHETERS) ×3 IMPLANT
KIT ROOM TURNOVER OR (KITS) ×3 IMPLANT
KIT SUCTION CATH 14FR (SUCTIONS) ×6 IMPLANT
KIT VASOVIEW 6 PRO VH 2400 (KITS) ×3 IMPLANT
LEAD PACING MYOCARDI (MISCELLANEOUS) ×3 IMPLANT
MARKER GRAFT CORONARY BYPASS (MISCELLANEOUS) ×9 IMPLANT
NS IRRIG 1000ML POUR BTL (IV SOLUTION) ×15 IMPLANT
PACK OPEN HEART (CUSTOM PROCEDURE TRAY) ×3 IMPLANT
PAD ARMBOARD 7.5X6 YLW CONV (MISCELLANEOUS) ×6 IMPLANT
PAD ELECT DEFIB RADIOL ZOLL (MISCELLANEOUS) ×3 IMPLANT
PENCIL BUTTON HOLSTER BLD 10FT (ELECTRODE) ×3 IMPLANT
PUNCH AORTIC ROTATE  4.5MM 8IN (MISCELLANEOUS) ×3 IMPLANT
SENSOR MYOCARDIAL TEMP (MISCELLANEOUS) ×3 IMPLANT
SET CARDIOPLEGIA MPS 5001102 (MISCELLANEOUS) ×3 IMPLANT
SPONGE GAUZE 4X4 12PLY STER LF (GAUZE/BANDAGES/DRESSINGS) ×6 IMPLANT
SURGIFLO W/THROMBIN 8M KIT (HEMOSTASIS) ×6 IMPLANT
SUT BONE WAX W31G (SUTURE) ×3 IMPLANT
SUT MNCRL AB 4-0 PS2 18 (SUTURE) ×3 IMPLANT
SUT PROLENE 3 0 SH1 36 (SUTURE) ×3 IMPLANT
SUT PROLENE 4 0 TF (SUTURE) ×6 IMPLANT
SUT PROLENE 6 0 CC (SUTURE) ×15 IMPLANT
SUT PROLENE 7 0 BV 1 (SUTURE) ×18 IMPLANT
SUT PROLENE 7 0 BV1 MDA (SUTURE) ×6 IMPLANT
SUT PROLENE 8 0 BV175 6 (SUTURE) ×3 IMPLANT
SUT STEEL 6MS V (SUTURE) ×3 IMPLANT
SUT STEEL SZ 6 DBL 3X14 BALL (SUTURE) ×3 IMPLANT
SUT VIC AB 1 CTX 18 (SUTURE) ×6 IMPLANT
SUT VIC AB 2-0 CT1 27 (SUTURE) ×1
SUT VIC AB 2-0 CT1 TAPERPNT 27 (SUTURE) ×2 IMPLANT
SUTURE E-PAK OPEN HEART (SUTURE) ×3 IMPLANT
SYSTEM SAHARA CHEST DRAIN ATS (WOUND CARE) ×3 IMPLANT
TAPE CLOTH SURG 4X10 WHT LF (GAUZE/BANDAGES/DRESSINGS) ×6 IMPLANT
TOWEL OR 17X24 6PK STRL BLUE (TOWEL DISPOSABLE) ×6 IMPLANT
TOWEL OR 17X26 10 PK STRL BLUE (TOWEL DISPOSABLE) ×6 IMPLANT
TRAY FOLEY IC TEMP SENS 16FR (CATHETERS) ×3 IMPLANT
TUBING INSUFFLATION (TUBING) ×3 IMPLANT
UNDERPAD 30X30 INCONTINENT (UNDERPADS AND DIAPERS) ×3 IMPLANT
WATER STERILE IRR 1000ML POUR (IV SOLUTION) ×6 IMPLANT

## 2016-02-02 NOTE — Procedures (Signed)
Extubation Procedure Note  Patient Details:   Name: Trevor Ward DOB: 24-Apr-1941 MRN: [id number]   Airway Documentation:  Airway 8 mm (Active)  Secured at (cm) 23 cm 02/02/2016  6:52 PM  Measured From Lips 02/02/2016  6:52 PM  Secured Location Right 02/02/2016  6:52 PM  Secured By Pink Tape 02/02/2016  6:52 PM  Cuff Pressure (cm H2O) 24 cm H2O 02/02/2016  2:56 PM  Site Condition Dry 02/02/2016  6:52 PM    Evaluation  O2 sats: stable throughout Complications: No apparent complications Patient did tolerate procedure well. Bilateral Breath Sounds: Clear   Yes  Patient extubated to 3L [place] without any complications. He was able to verbalize his name and location.  Maleea Camilo, Dub Mikes 02/02/2016, 7:50 PM

## 2016-02-02 NOTE — Brief Op Note (Addendum)
      [address]       [place] [address]             [phone]       02/02/2016  12:53 PM  PATIENT:  Trevor Ward  75 y.o. male  PRE-OPERATIVE DIAGNOSIS:  CAD  POST-OPERATIVE DIAGNOSIS:  CAD  PROCEDURE:  TRANSESOPHAGEAL ECHOCARDIOGRAM (TEE), MEDIAN STERNOTOMY for CORONARY ARTERY BYPASS GRAFTING (CABG) times 4 (LIMA to LAD, SVG SEQUENTIALLY to RAMUS INTERMEDIATE 1 and RAMUS INTERMEDIATE 2)using left internal mammary and left saphenous ven harvested by endovein  SURGEON:  Surgeon(s) and Role:    * Grace Isaac, MD - Primary  PHYSICIAN ASSISTANT: Lars Pinks PA-C  ANESTHESIA:   general  EBL:  Total I/O In: 1000 [I.V.:1000] Out: 875 [Urine:875]  DRAINS: Chest tubes placed in the mediastinal and pleural spaces   COUNTS CORRECT:  YES  DICTATION: .Dragon Dictation  PLAN OF CARE: Admit to inpatient   PATIENT DISPOSITION:  ICU - intubated and hemodynamically stable.   Delay start of Pharmacological VTE agent (>24hrs) due to surgical blood loss or risk of bleeding: yes  BASELINE WEIGHT: 89 kg

## 2016-02-02 NOTE — Progress Notes (Signed)
  Echocardiogram Echocardiogram Transesophageal has been performed.  Bobbye Charleston 02/02/2016, 9:49 AM

## 2016-02-02 NOTE — Progress Notes (Signed)
[facility] BRIEF SICU PROGRESS NOTE  Day of Surgery  S/P Procedure(s) (LRB): CORONARY ARTERY BYPASS GRAFTING (CABG) times 4 using left internal mammary and left saphenous ven harvested by endoven  (N/A) TRANSESOPHAGEAL ECHOCARDIOGRAM (TEE) (N/A)   Extubated uneventfully NSR w/ stable hemodynamics on low dose Neo drip Breathing comfortably w/ O2 sats 100% on 4 L/min Chest tube output low UOP adequate Labs okay  Plan: Continue current plan  Rexene Alberts, MD 02/02/2016 10:39 PM

## 2016-02-02 NOTE — Anesthesia Procedure Notes (Addendum)
Central Venous Catheter Insertion Performed by: anesthesiologist 02/02/2016 8:24 AM Patient location: Pre-op. Preanesthetic checklist: patient identified, IV checked, site marked, risks and benefits discussed, surgical consent, monitors and equipment checked, pre-op evaluation, timeout performed and anesthesia consent Position: Trendelenburg Lidocaine 1% used for infiltration Landmarks identified and Seldinger technique used Catheter size: 8.5 Fr PA cath was placed.Sheath introducer Swan type and PA catheter depth:thermodilution and 50PA Cath depth:50 Procedure performed using ultrasound guided technique. Attempts: 1 Following insertion, line sutured and dressing applied. Post procedure assessment: blood return through all ports, free fluid flow and no air. Patient tolerated the procedure well with no immediate complications.   Procedure Name: Intubation Date/Time: 02/02/2016 9:05 AM Performed by: Myna Bright Pre-anesthesia Checklist: Patient identified, Emergency Drugs available, Suction available and Patient being monitored Patient Re-evaluated:Patient Re-evaluated prior to inductionOxygen Delivery Method: Circle system utilized Preoxygenation: Pre-oxygenation with 100% oxygen Intubation Type: IV induction Ventilation: Mask ventilation without difficulty and Oral airway inserted - appropriate to patient size Laryngoscope Size: Mac and 4 Grade View: Grade I Tube type: Oral Tube size: 8.0 mm Number of attempts: 1 Airway Equipment and Method: Stylet Placement Confirmation: ETT inserted through vocal cords under direct vision,  positive ETCO2 and breath sounds checked- equal and bilateral Secured at: 23 cm Tube secured with: Tape Dental Injury: Teeth and Oropharynx as per pre-operative assessment

## 2016-02-02 NOTE — Transfer of Care (Signed)
Immediate Anesthesia Transfer of Care Note   Patient: Trevor Ward  Procedure(s) Performed: Procedure(s): CORONARY ARTERY BYPASS GRAFTING (CABG) times 4 using left internal mammary and left saphenous ven harvested by endoven  (N/A) TRANSESOPHAGEAL ECHOCARDIOGRAM (TEE) (N/A)  Patient Location: SICU  Anesthesia Type:General  Level of Consciousness: sedated, unresponsive and Patient remains intubated per anesthesia plan  Airway & Oxygen Therapy: Patient remains intubated per anesthesia plan and Patient placed on Ventilator (see vital sign flow sheet for setting)  Post-op Assessment: Report given to RN and Post -op Vital signs reviewed and stable  Post vital signs: Reviewed and stable  Last Vitals:  Filed Vitals:   02/01/16 2200 02/02/16 0500  BP:  135/75  Pulse: 74 70  Temp: 36.9 C 36.8 C  Resp:  20    Last Pain:  Filed Vitals:   02/02/16 0509  PainSc: 0-No pain      Patients Stated Pain Goal: 0 ([id number])  Complications: No apparent anesthesia complications

## 2016-02-03 ENCOUNTER — Inpatient Hospital Stay (HOSPITAL_COMMUNITY): Payer: Medicare Other

## 2016-02-03 ENCOUNTER — Encounter (HOSPITAL_COMMUNITY): Payer: Self-pay | Admitting: Cardiothoracic Surgery

## 2016-02-03 LAB — POCT I-STAT, CHEM 8
BUN: 16 mg/dL (ref 6–20)
CALCIUM ION: 1.17 mmol/L (ref 1.13–1.30)
Chloride: 100 mmol/L — ABNORMAL LOW (ref 101–111)
Creatinine, Ser: 0.9 mg/dL (ref 0.61–1.24)
GLUCOSE: 136 mg/dL — AB (ref 65–99)
HCT: 40 % (ref 39.0–52.0)
Hemoglobin: 13.6 g/dL (ref 13.0–17.0)
Potassium: 4.2 mmol/L (ref 3.5–5.1)
Sodium: 136 mmol/L (ref 135–145)
TCO2: 23 mmol/L (ref 0–100)

## 2016-02-03 LAB — CBC
HCT: 36.6 % — ABNORMAL LOW (ref 39.0–52.0)
HEMATOCRIT: 35.9 % — AB (ref 39.0–52.0)
HEMOGLOBIN: 12.1 g/dL — AB (ref 13.0–17.0)
Hemoglobin: 12.1 g/dL — ABNORMAL LOW (ref 13.0–17.0)
MCH: 29.2 pg (ref 26.0–34.0)
MCH: 28.9 pg (ref 26.0–34.0)
MCHC: 33.7 g/dL (ref 30.0–36.0)
MCHC: 33.1 g/dL (ref 30.0–36.0)
MCV: 86.7 fL (ref 78.0–100.0)
MCV: 87.4 fL (ref 78.0–100.0)
Platelets: 146 10*3/uL — ABNORMAL LOW (ref 150–400)
Platelets: 138 10*3/uL — ABNORMAL LOW (ref 150–400)
RBC: 4.14 MIL/uL — AB (ref 4.22–5.81)
RBC: 4.19 MIL/uL — ABNORMAL LOW (ref 4.22–5.81)
RDW: 14.4 % (ref 11.5–15.5)
RDW: 14.8 % (ref 11.5–15.5)
WBC: 13.6 10*3/uL — ABNORMAL HIGH (ref 4.0–10.5)
WBC: 14.9 10*3/uL — ABNORMAL HIGH (ref 4.0–10.5)

## 2016-02-03 LAB — GLUCOSE, CAPILLARY
GLUCOSE-CAPILLARY: 113 mg/dL — AB (ref 65–99)
GLUCOSE-CAPILLARY: 115 mg/dL — AB (ref 65–99)
GLUCOSE-CAPILLARY: 116 mg/dL — AB (ref 65–99)
GLUCOSE-CAPILLARY: 110 mg/dL — AB (ref 65–99)
GLUCOSE-CAPILLARY: 110 mg/dL — AB (ref 65–99)
GLUCOSE-CAPILLARY: 107 mg/dL — AB (ref 65–99)
GLUCOSE-CAPILLARY: 107 mg/dL — AB (ref 65–99)
GLUCOSE-CAPILLARY: 111 mg/dL — AB (ref 65–99)
GLUCOSE-CAPILLARY: 105 mg/dL — AB (ref 65–99)
Glucose-Capillary: 104 mg/dL — ABNORMAL HIGH (ref 65–99)
Glucose-Capillary: 106 mg/dL — ABNORMAL HIGH (ref 65–99)
Glucose-Capillary: 108 mg/dL — ABNORMAL HIGH (ref 65–99)
Glucose-Capillary: 111 mg/dL — ABNORMAL HIGH (ref 65–99)
Glucose-Capillary: 113 mg/dL — ABNORMAL HIGH (ref 65–99)
Glucose-Capillary: 113 mg/dL — ABNORMAL HIGH (ref 65–99)
Glucose-Capillary: 125 mg/dL — ABNORMAL HIGH (ref 65–99)
Glucose-Capillary: 118 mg/dL — ABNORMAL HIGH (ref 65–99)
Glucose-Capillary: 120 mg/dL — ABNORMAL HIGH (ref 65–99)
Glucose-Capillary: 98 mg/dL (ref 65–99)

## 2016-02-03 LAB — BASIC METABOLIC PANEL
Anion gap: 10 (ref 5–15)
BUN: 9 mg/dL (ref 6–20)
CO2: 22 mmol/L (ref 22–32)
CREATININE: 0.9 mg/dL (ref 0.61–1.24)
Calcium: 8.1 mg/dL — ABNORMAL LOW (ref 8.9–10.3)
Chloride: 106 mmol/L (ref 101–111)
GFR calc non Af Amer: >60 mL/min (ref >60)
Glucose, Bld: 116 mg/dL — ABNORMAL HIGH (ref 65–99)
Potassium: 4.2 mmol/L (ref 3.5–5.1)
Sodium: 138 mmol/L (ref 135–145)

## 2016-02-03 LAB — HEMOGLOBIN A1C
Hgb A1c MFr Bld: 6.4 % — ABNORMAL HIGH (ref 4.8–5.6)
MEAN PLASMA GLUCOSE: 137 mg/dL

## 2016-02-03 LAB — MAGNESIUM
MAGNESIUM: 2.2 mg/dL (ref 1.7–2.4)
Magnesium: 2.3 mg/dL (ref 1.7–2.4)

## 2016-02-03 LAB — CREATININE, SERUM
Creatinine, Ser: 1.06 mg/dL (ref 0.61–1.24)
GFR calc Af Amer: >60 mL/min (ref >60)

## 2016-02-03 MED ORDER — INSULIN DETEMIR 100 UNIT/ML ~~LOC~~ SOLN
15.0000 [IU] | Freq: Every day | SUBCUTANEOUS | Status: DC
  Filled 2016-02-03: qty 0.15

## 2016-02-03 MED ORDER — INSULIN ASPART 100 UNIT/ML ~~LOC~~ SOLN
0.0000 [IU] | SUBCUTANEOUS | Status: DC
  Administered 2016-02-03 – 2016-02-04 (×2): 2 [IU] via SUBCUTANEOUS

## 2016-02-03 MED ORDER — ENOXAPARIN SODIUM 30 MG/0.3ML ~~LOC~~ SOLN
30.0000 mg | Freq: Every day | SUBCUTANEOUS | Status: DC
  Administered 2016-02-03 – 2016-02-04 (×2): 30 mg via SUBCUTANEOUS
  Filled 2016-02-03 (×2): qty 0.3

## 2016-02-03 MED ORDER — INSULIN DETEMIR 100 UNIT/ML ~~LOC~~ SOLN
15.0000 [IU] | Freq: Once | SUBCUTANEOUS | Status: AC
  Administered 2016-02-03: 15 [IU] via SUBCUTANEOUS
  Filled 2016-02-03: qty 0.15

## 2016-02-03 NOTE — Anesthesia Postprocedure Evaluation (Signed)
Anesthesia Post Note  Patient: Trevor Ward  Procedure(s) Performed: Procedure(s) (LRB): CORONARY ARTERY BYPASS GRAFTING (CABG) times 4 using left internal mammary and left saphenous ven harvested by endoven  (N/A) TRANSESOPHAGEAL ECHOCARDIOGRAM (TEE) (N/A)  Patient location during evaluation: ICU Anesthesia Type: General Level of consciousness: patient remains intubated per anesthesia plan Vital Signs Assessment: post-procedure vital signs reviewed and stable Respiratory status: patient remains intubated per anesthesia plan Cardiovascular status: stable Anesthetic complications: no    Last Vitals:  Filed Vitals:   02/03/16 0645 02/03/16 0700  BP:  107/69  Pulse: 88 89  Temp: 37.5 C 37.5 C  Resp: 20 21    Last Pain:  Filed Vitals:   02/03/16 0711  PainSc: Trevor Ward

## 2016-02-03 NOTE — Progress Notes (Signed)
      [address]       [place] [address]             [phone]       Sleeping presently  BP 127/79 mmHg  Pulse 80  Temp(Src) 97.7 F (36.5 C) (Oral)  Resp 32  Ht 5\' 8"  (1.727 m)  Wt 208 lb 1.8 oz (94.4 kg)  BMI 31.65 kg/m2  SpO2 100%   Intake/Output Summary (Last 24 hours) at 02/03/16 1722 Last data filed at 02/03/16 1600  Gross per 24 hour  Intake 2351.09 ml  Output   1520 ml  Net 831.09 ml    CBG well controlled K= 4.2, creatinine = 0.9, HCT= 36  Karagan Lehr C. Roxan Hockey, MD [facility] [phone]

## 2016-02-03 NOTE — Progress Notes (Signed)
Patient ID: Trevor Ward, male   DOB: Jun 23, 1941, 75 y.o.   MRN: [id number] [facility] DAILY ICU PROGRESS NOTE                   [address]            [place] [address]          [phone]   1 Day Post-Op Procedure(s) (LRB): CORONARY ARTERY BYPASS GRAFTING (CABG) times 4 using left internal mammary and left saphenous ven harvested by endoven  (N/A) TRANSESOPHAGEAL ECHOCARDIOGRAM (TEE) (N/A)  Total Length of Stay:  LOS: 2 days   Subjective: Awake and alert , neuro intact  Objective: Vital signs in last 24 hours: Temp:  [97.5 F (36.4 C)-99.9 F (37.7 C)] 99.5 F (37.5 C) (05/11 0700) Pulse Rate:  [76-159] 89 (05/11 0700) Cardiac Rhythm:  [-] Normal sinus rhythm (05/11 0400) Resp:  [12-29] 21 (05/11 0700) BP: (79-133)/(56-107) 107/69 mmHg (05/11 0700) SpO2:  [93 %-100 %] 100 % (05/11 0700) Arterial Line BP: (84-140)/(47-73) 119/53 mmHg (05/11 0700) FiO2 (%):  [40 %-50 %] 40 % (05/10 1852) Weight:  [208 lb 1.8 oz (94.4 kg)] 208 lb 1.8 oz (94.4 kg) (05/11 0545)  Filed Weights   02/01/16 0850 02/02/16 0500 02/03/16 0545  Weight: 199 lb 2 oz (90.323 kg) 196 lb 1.6 oz (88.95 kg) 208 lb 1.8 oz (94.4 kg)    Weight change: 8 lb 15.8 oz (4.078 kg)   Hemodynamic parameters for last 24 hours: PAP: (23-47)/(11-30) 31/17 mmHg CO:  [1.3 L/min-4.6 L/min] 4.6 L/min CI:  [1.3 L/min/m2-2.3 L/min/m2] 2.3 L/min/m2  Intake/Output from previous day: 05/10 0701 - 05/11 0700 In: 4629.8 [I.V.:2746.8; Blood:583; IV Piggyback:1300] Out: 4645 [Urine:2865; Blood:1400; Chest Tube:380]  Intake/Output this shift:    Current Meds: Scheduled Meds: . acetaminophen  1,000 mg Oral Q6H   Or  . acetaminophen (TYLENOL) oral liquid 160 mg/5 mL  1,000 mg Per Tube Q6H  . allopurinol  100 mg Oral BID  . amitriptyline  10 mg Oral QHS  . antiseptic oral rinse  7 mL Mouth Rinse QID  . aspirin EC  325 mg Oral Daily   Or  . aspirin  324 mg Per Tube Daily  . bisacodyl  10 mg Oral Daily     Or  . bisacodyl  10 mg Rectal Daily  . cefUROXime (ZINACEF)  IV  1.5 g Intravenous Q12H  . chlorhexidine gluconate (SAGE KIT)  15 mL Mouth Rinse BID  . docusate sodium  200 mg Oral Daily  . famotidine (PEPCID) IV  20 mg Intravenous Q12H  . insulin regular  0-10 Units Intravenous TID WC  . metoprolol tartrate  12.5 mg Oral BID   Or  . metoprolol tartrate  12.5 mg Per Tube BID  . [START ON 02/04/2016] pantoprazole  40 mg Oral Daily  . sodium chloride flush  3 mL Intravenous Q12H   Continuous Infusions: . sodium chloride 10 mL/hr (02/02/16 1500)  . sodium chloride    . sodium chloride 20 mL/hr (02/02/16 1500)  . dexmedetomidine Stopped (02/02/16 1757)  . insulin (NOVOLIN-R) infusion 1.9 Units/hr (02/03/16 0700)  . lactated ringers 10 mL/hr (02/02/16 1500)  . lactated ringers 10 mL/hr (02/02/16 1500)  . nitroGLYCERIN    . phenylephrine (NEO-SYNEPHRINE) Adult infusion Stopped (02/03/16 0600)   PRN Meds:.sodium chloride, albumin human, lactated ringers, metoprolol, midazolam, morphine injection, ondansetron (ZOFRAN) IV, oxyCODONE, sodium chloride flush, traMADol  General appearance: alert, cooperative and no distress Neurologic:  intact Heart: regular rate and rhythm, S1, S2 normal, no murmur, click, rub or gallop Lungs: diminished breath sounds bibasilar Abdomen: soft, non-tender; bowel sounds normal; no masses,  no organomegaly Extremities: extremities normal, atraumatic, no cyanosis or edema and Homans sign is negative, no sign of DVT Wound: sternum stable  Lab Results: CBC: Recent Labs  02/02/16 2053 02/03/16 0434  WBC 11.9* 13.6*  HGB 12.3* 12.1*  HCT 36.5* 35.9*  PLT 155 146*   BMET:  Recent Labs  02/02/16 0420  02/02/16 2050 02/02/16 2053 02/03/16 0434  NA 141  < > 136  --  138  K 3.6  < > 4.3  --  4.2  CL 106  < > 105  --  106  CO2 24  --   --   --  22  GLUCOSE 146*  < > 145*  --  116*  BUN 8  < > 9  --  9  CREATININE 1.01  < > 0.80 0.87 0.90  CALCIUM 8.9   --   --   --  8.1*  < > = values in this interval not displayed.  PT/INR:  Recent Labs  02/02/16 1512  LABPROT 17.3*  INR 1.41   Radiology: Dg Chest Port 1 View  02/03/2016  CLINICAL DATA:  Status post coronary bypass grafting EXAM: PORTABLE CHEST 1 VIEW COMPARISON:  02/02/2016 FINDINGS: Endotracheal tube and nasogastric catheter been removed in the interval. A left-sided thoracostomy catheter, mediastinal drain and Swan-Ganz catheter are again noted and stable. No new pneumothorax is seen. Bibasilar atelectasis is noted. Calcified pleural plaques are seen. There is been near complete resolution of the tiny right-sided pneumothorax. No bony abnormality is seen. IMPRESSION: Persistent bibasilar changes. Tubes and lines as described. Tiny right lateral pneumothorax improved from the previous exam. Electronically Signed   By: Inez Catalina M.D.   On: 02/03/2016 07:36   Dg Chest Port 1 View  02/02/2016  CLINICAL DATA:  Post CABG EXAM: PORTABLE CHEST 1 VIEW COMPARISON:  Five/ 8/17 FINDINGS: Cardiomediastinal silhouette is stable. The patient is status post CABG. Calcified pleural plaques bilaterally again noted. There is right IJ Swan-Ganz catheter with tip in the region of main pulmonary artery. Left chest tube in place. Mild left basilar atelectasis. There is tiny right upper lateral pneumothorax. NG tube in place with tip in proximal stomach. Endotracheal tube in place with tip 3.4 cm above the carina. IMPRESSION: The patient is status post CABG. Calcified pleural plaques bilaterally again noted. There is right IJ Swan-Ganz catheter with tip in the region of main pulmonary artery. Left chest tube in place. Mild left basilar atelectasis. There is tiny right upper lateral pneumothorax. NG tube in place with tip in proximal stomach. Endotracheal tube in place with tip 3.4 cm above the carina. Electronically Signed   By: Lahoma Crocker M.D.   On: 02/02/2016 15:42     Assessment/Plan: S/P Procedure(s)  (LRB): CORONARY ARTERY BYPASS GRAFTING (CABG) times 4 using left internal mammary and left saphenous ven harvested by endoven  (N/A) TRANSESOPHAGEAL ECHOCARDIOGRAM (TEE) (N/A) Mobilize Diuresis Diabetes control d/c tubes/lines See progression orders Expected Acute  Blood - loss Anemia     Grace Isaac 02/03/2016 7:42 AM

## 2016-02-04 ENCOUNTER — Inpatient Hospital Stay (HOSPITAL_COMMUNITY): Payer: Medicare Other

## 2016-02-04 LAB — BASIC METABOLIC PANEL
Anion gap: 9 (ref 5–15)
BUN: 15 mg/dL (ref 6–20)
CO2: 24 mmol/L (ref 22–32)
Calcium: 8.5 mg/dL — ABNORMAL LOW (ref 8.9–10.3)
Chloride: 101 mmol/L (ref 101–111)
Creatinine, Ser: 0.96 mg/dL (ref 0.61–1.24)
GFR calc Af Amer: >60 mL/min (ref >60)
GFR calc non Af Amer: >60 mL/min (ref >60)
Glucose, Bld: 110 mg/dL — ABNORMAL HIGH (ref 65–99)
Potassium: 4.5 mmol/L (ref 3.5–5.1)
Sodium: 134 mmol/L — ABNORMAL LOW (ref 135–145)

## 2016-02-04 LAB — CBC
HCT: 36.9 % — ABNORMAL LOW (ref 39.0–52.0)
Hemoglobin: 12 g/dL — ABNORMAL LOW (ref 13.0–17.0)
MCH: 28.8 pg (ref 26.0–34.0)
MCHC: 32.5 g/dL (ref 30.0–36.0)
MCV: 88.7 fL (ref 78.0–100.0)
Platelets: 148 10*3/uL — ABNORMAL LOW (ref 150–400)
RBC: 4.16 MIL/uL — ABNORMAL LOW (ref 4.22–5.81)
RDW: 14.7 % (ref 11.5–15.5)
WBC: 15.1 10*3/uL — ABNORMAL HIGH (ref 4.0–10.5)

## 2016-02-04 LAB — GLUCOSE, CAPILLARY
GLUCOSE-CAPILLARY: 111 mg/dL — AB (ref 65–99)
GLUCOSE-CAPILLARY: 117 mg/dL — AB (ref 65–99)
GLUCOSE-CAPILLARY: 121 mg/dL — AB (ref 65–99)
Glucose-Capillary: 91 mg/dL (ref 65–99)
Glucose-Capillary: 97 mg/dL (ref 65–99)

## 2016-02-04 MED ORDER — PANTOPRAZOLE SODIUM 40 MG PO TBEC
40.0000 mg | DELAYED_RELEASE_TABLET | Freq: Every day | ORAL | Status: DC
  Administered 2016-02-05 – 2016-02-10 (×6): 40 mg via ORAL
  Filled 2016-02-04 (×6): qty 1

## 2016-02-04 MED ORDER — ONDANSETRON HCL 4 MG PO TABS
4.0000 mg | ORAL_TABLET | Freq: Four times a day (QID) | ORAL | Status: DC | PRN
  Administered 2016-02-05 – 2016-02-10 (×3): 4 mg via ORAL
  Filled 2016-02-04 (×4): qty 1

## 2016-02-04 MED ORDER — ROSUVASTATIN CALCIUM 10 MG PO TABS
5.0000 mg | ORAL_TABLET | Freq: Every day | ORAL | Status: DC
  Administered 2016-02-05 – 2016-02-10 (×6): 5 mg via ORAL
  Filled 2016-02-04 (×6): qty 1

## 2016-02-04 MED ORDER — POTASSIUM CHLORIDE CRYS ER 10 MEQ PO TBCR
10.0000 meq | EXTENDED_RELEASE_TABLET | Freq: Every day | ORAL | Status: AC
  Administered 2016-02-05 – 2016-02-06 (×2): 10 meq via ORAL
  Filled 2016-02-04 (×2): qty 1

## 2016-02-04 MED ORDER — BISACODYL 5 MG PO TBEC: 10.0000 mg | DELAYED_RELEASE_TABLET | Freq: Every day | ORAL | Status: DC | PRN

## 2016-02-04 MED ORDER — SODIUM CHLORIDE 0.9% FLUSH
3.0000 mL | Freq: Two times a day (BID) | INTRAVENOUS | Status: DC
  Administered 2016-02-04 – 2016-02-09 (×6): 3 mL via INTRAVENOUS

## 2016-02-04 MED ORDER — INSULIN ASPART 100 UNIT/ML ~~LOC~~ SOLN
0.0000 [IU] | Freq: Three times a day (TID) | SUBCUTANEOUS | Status: DC
  Administered 2016-02-05: 2 [IU] via SUBCUTANEOUS

## 2016-02-04 MED ORDER — ONDANSETRON HCL 4 MG/2ML IJ SOLN
4.0000 mg | Freq: Four times a day (QID) | INTRAMUSCULAR | Status: DC | PRN
  Administered 2016-02-09: 4 mg via INTRAVENOUS
  Filled 2016-02-04: qty 2

## 2016-02-04 MED ORDER — FUROSEMIDE 40 MG PO TABS
40.0000 mg | ORAL_TABLET | Freq: Every day | ORAL | Status: DC
  Administered 2016-02-04: 40 mg via ORAL
  Filled 2016-02-04: qty 1

## 2016-02-04 MED ORDER — SODIUM CHLORIDE 0.9% FLUSH: 3.0000 mL | INTRAVENOUS | Status: DC | PRN

## 2016-02-04 MED ORDER — SODIUM CHLORIDE 0.9 % IV SOLN: 250.0000 mL | INTRAVENOUS | Status: DC | PRN

## 2016-02-04 MED ORDER — GUAIFENESIN ER 600 MG PO TB12
600.0000 mg | ORAL_TABLET | Freq: Two times a day (BID) | ORAL | Status: DC | PRN
  Administered 2016-02-07 – 2016-02-09 (×3): 600 mg via ORAL
  Filled 2016-02-04 (×3): qty 1

## 2016-02-04 MED ORDER — ASPIRIN EC 81 MG PO TBEC
81.0000 mg | DELAYED_RELEASE_TABLET | Freq: Every day | ORAL | Status: DC
  Administered 2016-02-05 – 2016-02-10 (×6): 81 mg via ORAL
  Filled 2016-02-04 (×6): qty 1

## 2016-02-04 MED ORDER — ALLOPURINOL 100 MG PO TABS
100.0000 mg | ORAL_TABLET | Freq: Two times a day (BID) | ORAL | Status: DC
  Administered 2016-02-04 – 2016-02-10 (×12): 100 mg via ORAL
  Filled 2016-02-04 (×12): qty 1

## 2016-02-04 MED ORDER — VITAMIN B-12 1000 MCG PO TABS
500.0000 ug | ORAL_TABLET | Freq: Every day | ORAL | Status: DC
  Administered 2016-02-05 – 2016-02-10 (×6): 500 ug via ORAL
  Filled 2016-02-04 (×8): qty 1

## 2016-02-04 MED ORDER — DOCUSATE SODIUM 100 MG PO CAPS
200.0000 mg | ORAL_CAPSULE | Freq: Every day | ORAL | Status: DC
  Administered 2016-02-05 – 2016-02-10 (×5): 200 mg via ORAL
  Filled 2016-02-04 (×6): qty 2

## 2016-02-04 MED ORDER — BISACODYL 10 MG RE SUPP: 10.0000 mg | Freq: Every day | RECTAL | Status: DC | PRN

## 2016-02-04 MED ORDER — METOPROLOL TARTRATE 12.5 MG HALF TABLET
12.5000 mg | ORAL_TABLET | Freq: Two times a day (BID) | ORAL | Status: DC
Start: 2016-02-04 — End: 2016-02-05
  Administered 2016-02-04: 12.5 mg via ORAL
  Filled 2016-02-04 (×2): qty 1

## 2016-02-04 MED ORDER — MOVING RIGHT ALONG BOOK
Freq: Once | Status: AC
  Administered 2016-02-04: 08:00:00
  Filled 2016-02-04: qty 1

## 2016-02-04 MED ORDER — OXYCODONE HCL 5 MG PO TABS
5.0000 mg | ORAL_TABLET | ORAL | Status: DC | PRN
  Administered 2016-02-08 – 2016-02-09 (×2): 5 mg via ORAL
  Filled 2016-02-04 (×2): qty 1

## 2016-02-04 MED ORDER — TRAMADOL HCL 50 MG PO TABS
50.0000 mg | ORAL_TABLET | ORAL | Status: DC | PRN
  Filled 2016-02-04: qty 1

## 2016-02-04 NOTE — Op Note (Signed)
NAMEHUSSAM, Trevor Ward NO.:  [id number]  MEDICAL RECORD NO.:  [id number]  LOCATION:  2S01C                        FACILITY:  [facility]  PHYSICIAN:  Lanelle Bal, MD    DATE OF BIRTH:  06/18/1941  DATE OF PROCEDURE:  02/02/2016 DATE OF DISCHARGE:                              OPERATIVE REPORT   PREOPERATIVE DIAGNOSIS:  Coronary occlusive disease with unstable angina.  POSTOPERATIVE DIAGNOSIS:  Coronary occlusive disease with unstable angina.  SURGICAL PROCEDURE:  Coronary artery bypass grafting x4 with left internal mammary to left anterior descending coronary artery, sequential reverse saphenous vein graft to the first and second intermediate reverse saphenous vein graft to the posterior descending with the left greater saphenous vein endoscopic harvesting left thigh.  SURGEON:  Lanelle Bal, MD.  FIRST ASSISTANT:  Lars Pinks, [place].  BRIEF HISTORY:  The patient is a 75 year old male, who presented with a stroke in the summer of 2016 and underwent carotid endarterectomy.  Over the past 6-8 months, he has had progressive episodes of "indigestion," often brought on by increased exertion such as cutting his grass. Because of increasing frequency and severity, came and was admitted and underwent cardiac catheterization, which revealed preserved LV function with 80% LAD stenosis, 90% stenosis of a large intermediate branch involving 2 large branches.  The circumflex itself was high in the AV groove and had collateral flow to a totally occluded right coronary artery through the posterior descending.  Preop echo was performed which suggested a bicuspid aortic valve or more likely at the time of TEE, a trileaflet valve with some sclerosis infusion of 2 cusps though there was no aortic stenosis.  Mitral valve was without regurgitation.  A CT scan of the chest was done because of an abnormal chest x-ray.  [name] ascending aorta was dilated to 4.1 to  4.2 cm.  Risks and options of surgery were discussed with the patient in detail, and he was willing to proceed and signed informed consent.  DESCRIPTION OF PROCEDURE:  With Swan-Ganz and arterial line monitors in place, the patient underwent general endotracheal anesthesia without incident.  Skin of chest and legs was prepped with Betadine and draped in usual sterile manner.  Using the Guidant and endoscopic vein harvesting system, vein was harvested from the left thigh.  We had made an incision at the right knee and located the vein which was appeared small and bifurcated and moved to the left side.  Median sternotomy was performed. On examination of the left pleural space there was extensive calcific pleural plaques present, this are in the media tab of Left internal mammary artery was dissected down as a pedicle graft.  The distal artery was divided, had good free flow.  Pericardium was opened.  Overall, ventricular function appeared preserved.  The patient was systemically heparinized.  Ascending aorta was cannulated. The right atrium was cannulated.  An aortic root vent cardioplegia needle was introduced into the ascending aorta.  The patient was placed on cardiac pulmonary bypass 2.4 L/min/m2.  Sites of anastomosis were selected and dissected epicardium.  Aortic crossclamp was applied and 500 mL of cold blood potassium cardioplegia was administered with diastolic arrest of the heart.  Myocardial  septal temperature was monitored throughout the crossclamp.  We first turned our attention to the posterior descending coronary artery, which was relatively small vessel, was opened, admitted a 1 mm probe distally using a running 7-0 Prolene.  Distal anastomosis was performed with a second reverse saphenous vein graft.  The heart was elevated, and with some difficulty, 2 large branches of the intermediate were dissected out of the intramyocardial position.  A segment of reverse saphenous vein  graft then was sequentially anastomosed to the first and second intermediate branches with running 7-0 Prolene.  Distal extent of the same vein was sewn to the second intermediate branch.  Additional cold blood cardioplegia was administered.  Attention was then turned to the left anterior descending coronary artery.  The very distal artery was diffusely diseased and the midportion of the LAD was intramyocardial. The vessel was dissected out of the intramuscular position.  A 1 mm probe passed proximally and distally.  Using a running 8-0 Prolene, left internal mammary artery was anastomosed to the left anterior descending coronary artery.  With crossclamp still in place, 2 punch aortotomies were performed and each 2 vein grafts were anastomosed to the ascending aorta.  Air was evacuated from the grafts.  Bulldog was removed from the mammary artery with rise in myocardial septal temperature.  Aortic crossclamp was removed.  Crossclamp time 97 minutes.  The patient spontaneously converted to a sinus rhythm.  He was atrially paced to increased rate.  Sites of anastomoses were inspected and free of bleeding.  He was then ventilated and weaned from cardiopulmonary bypass without difficulty.  He remained hemodynamically stable.  He was decannulated in usual fashion.  Protamine sulfate was administered with the operative field hemostatic.  Left pleural tube and a Blake mediastinal drain were left in place.  The sternum was closed with #6 stainless steel wire.  Fascia was closed with interrupted 0 Vicryl, running 3-0 Vicryl in subcutaneous tissue, and 4-0 subcuticular stitch in skin edges.  Dry dressings were applied.  Sponge and needle count was reported as correct at the completion of procedure.  The patient tolerated the procedure without obvious complications.  Did not require any blood bank blood products.     Lanelle Bal, MD     EG/MEDQ  D:  02/03/2016  T:  02/04/2016  Job:   [id number]

## 2016-02-04 NOTE — Progress Notes (Addendum)
Patient ID: Trevor Ward, male   DOB: 1941/05/30, 75 y.o.   MRN: [id number] [facility] DAILY ICU PROGRESS NOTE                   [address]            [place] [address]          [phone]   2 Days Post-Op Procedure(s) (LRB): CORONARY ARTERY BYPASS GRAFTING (CABG) times 4 using left internal mammary and left saphenous ven harvested by endoven  (N/A) TRANSESOPHAGEAL ECHOCARDIOGRAM (TEE) (N/A)  Total Length of Stay:  LOS: 3 days   Subjective: Up in chair, walked this am  Objective: Vital signs in last 24 hours: Temp:  [97.7 F (36.5 C)-99.6 F (37.6 C)] 99.6 F (37.6 C) (05/12 0400) Pulse Rate:  [80-109] 96 (05/12 0700) Cardiac Rhythm:  [-] Normal sinus rhythm (05/12 0700) Resp:  [19-38] 22 (05/12 0700) BP: (97-151)/(58-82) 108/60 mmHg (05/12 0700) SpO2:  [93 %-100 %] 98 % (05/12 0700) Arterial Line BP: (104-139)/(48-56) 139/52 mmHg (05/11 1200) Weight:  [207 lb 10.8 oz (94.2 kg)] 207 lb 10.8 oz (94.2 kg) (05/12 0500)  Filed Weights   02/02/16 0500 02/03/16 0545 02/04/16 0500  Weight: 196 lb 1.6 oz (88.95 kg) 208 lb 1.8 oz (94.4 kg) 207 lb 10.8 oz (94.2 kg)    Weight change: -7.1 oz (-0.2 kg)   Hemodynamic parameters for last 24 hours: PAP: (33-38)/(17-19) 33/17 mmHg  Intake/Output from previous day: 05/11 0701 - 05/12 0700 In: 1430.5 [P.O.:970; I.V.:360.5; IV Piggyback:100] Out: 1040 [Urine:880; Chest Tube:160]  Intake/Output this shift:    Current Meds: Scheduled Meds: . acetaminophen  1,000 mg Oral Q6H   Or  . acetaminophen (TYLENOL) oral liquid 160 mg/5 mL  1,000 mg Per Tube Q6H  . allopurinol  100 mg Oral BID  . amitriptyline  10 mg Oral QHS  . aspirin EC  325 mg Oral Daily   Or  . aspirin  324 mg Per Tube Daily  . bisacodyl  10 mg Oral Daily   Or  . bisacodyl  10 mg Rectal Daily  . cefUROXime (ZINACEF)  IV  1.5 g Intravenous Q12H  . docusate sodium  200 mg Oral Daily  . enoxaparin (LOVENOX) injection  30 mg Subcutaneous QHS  .  insulin aspart  0-24 Units Subcutaneous Q4H  . insulin detemir  15 Units Subcutaneous Daily  . metoprolol tartrate  12.5 mg Oral BID   Or  . metoprolol tartrate  12.5 mg Per Tube BID  . pantoprazole  40 mg Oral Daily  . sodium chloride flush  3 mL Intravenous Q12H   Continuous Infusions: . sodium chloride Stopped (02/03/16 1000)  . sodium chloride    . sodium chloride 20 mL/hr (02/02/16 1500)  . dexmedetomidine Stopped (02/02/16 1757)  . insulin (NOVOLIN-R) infusion Stopped (02/03/16 1400)  . lactated ringers Stopped (02/03/16 2200)  . lactated ringers 10 mL/hr (02/02/16 1500)  . nitroGLYCERIN    . phenylephrine (NEO-SYNEPHRINE) Adult infusion Stopped (02/03/16 0600)   PRN Meds:.sodium chloride, lactated ringers, metoprolol, midazolam, morphine injection, ondansetron (ZOFRAN) IV, oxyCODONE, sodium chloride flush, traMADol  Medications Prior to Admission  Medication Sig Dispense Refill  . allopurinol (ZYLOPRIM) 100 MG tablet Take 100 mg by mouth 2 (two) times daily.    Marland Kitchen amitriptyline (ELAVIL) 10 MG tablet Take 10 mg by mouth at bedtime.     Marland Kitchen aspirin 81 MG tablet Take 81 mg by mouth daily.    Marland Kitchen  esomeprazole (NEXIUM) 40 MG capsule Take 40 mg by mouth 2 (two) times daily before a meal.     . Glucosamine Sulfate (GLUCOSAMINE RELIEF) 1000 MG TABS Take 2,000 mg by mouth daily.    Marland Kitchen lisinopril (PRINIVIL,ZESTRIL) 10 MG tablet Take 10 mg by mouth daily.    . Omega-3 Fatty Acids (FISH OIL) 1200 MG CAPS Take 1,200 mg by mouth daily.    . vitamin B-12 (CYANOCOBALAMIN) 500 MCG tablet Take 500 mcg by mouth daily.      General appearance: alert, cooperative and no distress Neurologic: intact Heart: regular rate and rhythm, S1, S2 normal, no murmur, click, rub or gallop Lungs: diminished breath sounds bibasilar Abdomen: soft, non-tender; bowel sounds normal; no masses,  no organomegaly Extremities: extremities normal, atraumatic, no cyanosis or edema and Homans sign is negative, no sign of  DVT Wound: sternum stable  Lab Results: CBC: Recent Labs  02/03/16 1648 02/03/16 1650 02/04/16 0309  WBC 14.9*  --  15.1*  HGB 12.1* 13.6 12.0*  HCT 36.6* 40.0 36.9*  PLT 138*  --  148*   BMET:  Recent Labs  02/03/16 0434  02/03/16 1650 02/04/16 0309  NA 138  --  136 134*  K 4.2  --  4.2 4.5  CL 106  --  100* 101  CO2 22  --   --  24  GLUCOSE 116*  --  136* 110*  BUN 9  --  16 15  CREATININE 0.90  < > 0.90 0.96  CALCIUM 8.1*  --   --  8.5*  < > = values in this interval not displayed.  PT/INR:  Recent Labs  02/02/16 1512  LABPROT 17.3*  INR 1.41   Radiology: No results found.   Assessment/Plan: S/P Procedure(s) (LRB): CORONARY ARTERY BYPASS GRAFTING (CABG) times 4 using left internal mammary and left saphenous ven harvested by endoven  (N/A) TRANSESOPHAGEAL ECHOCARDIOGRAM (TEE) (N/A) Mobilize Diuresis Diabetes control d/c tubes/lines Plan for transfer to step-down: see transfer orders Patient had joint pains so stopped statin, will try low dose crestor     Grace Isaac 02/04/2016 7:16 AM

## 2016-02-04 NOTE — Progress Notes (Addendum)
Pt due to void by 5:45PM. Scanned bladder - nurse nor nurse tech able to find volume. Pt only drank 120 mL fluids. PA made aware. Await orders. Will continue to monitor.   Per Junie Panning PA - attempt to give pt dose of Lasix he refused this am. If no void by 8:30pm bladder scan and call MD on call. Given PO Lasix at 6:50 PM.  Fritz Pickerel, RN

## 2016-02-04 NOTE — Progress Notes (Signed)
Patient voided 400 cc in urinal. Asked patient if he would like to ambulate and he refused at this time.

## 2016-02-04 NOTE — Progress Notes (Signed)
CARDIAC REHAB PHASE I  Went to ambulate with pt.  Pt attempting to eat lunch, falling asleep during attempted conversation. Per pt family, pt has been somewhat confused, very sleepy, and has not walked today. Spoke with pt RN, per RN, pt very weak, had difficulty following commands with transfer from wheelchair to bed. Will hold ambulation today, will follow up tomorrow as x2 assist.   Lenna Sciara, RN, BSN 02/04/2016 2:12 PM

## 2016-02-04 NOTE — Care Management Important Message (Signed)
Important Message  Patient Details  Name: Trevor Ward MRN: [id number] Date of Birth: 21-Oct-1940   Medicare Important Message Given:  Yes    Keland Peyton Abena 02/04/2016, 11:01 AM

## 2016-02-05 ENCOUNTER — Inpatient Hospital Stay (HOSPITAL_COMMUNITY): Payer: Medicare Other

## 2016-02-05 LAB — CBC
HCT: 35.1 % — ABNORMAL LOW (ref 39.0–52.0)
Hemoglobin: 11.6 g/dL — ABNORMAL LOW (ref 13.0–17.0)
MCH: 28.9 pg (ref 26.0–34.0)
MCHC: 33 g/dL (ref 30.0–36.0)
MCV: 87.5 fL (ref 78.0–100.0)
Platelets: 161 10*3/uL (ref 150–400)
RBC: 4.01 MIL/uL — ABNORMAL LOW (ref 4.22–5.81)
RDW: 14.5 % (ref 11.5–15.5)
WBC: 11.2 10*3/uL — ABNORMAL HIGH (ref 4.0–10.5)

## 2016-02-05 LAB — BASIC METABOLIC PANEL
Anion gap: 11 (ref 5–15)
BUN: 20 mg/dL (ref 6–20)
CO2: 23 mmol/L (ref 22–32)
Calcium: 8.3 mg/dL — ABNORMAL LOW (ref 8.9–10.3)
Chloride: 98 mmol/L — ABNORMAL LOW (ref 101–111)
Creatinine, Ser: 0.96 mg/dL (ref 0.61–1.24)
GFR calc Af Amer: >60 mL/min (ref >60)
GFR calc non Af Amer: >60 mL/min (ref >60)
Glucose, Bld: 135 mg/dL — ABNORMAL HIGH (ref 65–99)
Potassium: 4.1 mmol/L (ref 3.5–5.1)
Sodium: 132 mmol/L — ABNORMAL LOW (ref 135–145)

## 2016-02-05 LAB — GLUCOSE, CAPILLARY: Glucose-Capillary: 128 mg/dL — ABNORMAL HIGH (ref 65–99)

## 2016-02-05 MED ORDER — ENOXAPARIN SODIUM 40 MG/0.4ML ~~LOC~~ SOLN
40.0000 mg | Freq: Every day | SUBCUTANEOUS | Status: DC
  Administered 2016-02-05 – 2016-02-09 (×5): 40 mg via SUBCUTANEOUS
  Filled 2016-02-05 (×5): qty 0.4

## 2016-02-05 MED ORDER — METOPROLOL TARTRATE 25 MG PO TABS
25.0000 mg | ORAL_TABLET | Freq: Two times a day (BID) | ORAL | Status: DC
  Administered 2016-02-05 – 2016-02-10 (×11): 25 mg via ORAL
  Filled 2016-02-05 (×11): qty 1

## 2016-02-05 MED ORDER — METOCLOPRAMIDE HCL 5 MG/ML IJ SOLN
10.0000 mg | Freq: Three times a day (TID) | INTRAMUSCULAR | Status: AC
  Administered 2016-02-05 – 2016-02-06 (×3): 10 mg via INTRAVENOUS
  Filled 2016-02-05 (×3): qty 2

## 2016-02-05 MED ORDER — ACETAMINOPHEN 325 MG PO TABS
650.0000 mg | ORAL_TABLET | Freq: Four times a day (QID) | ORAL | Status: DC | PRN
  Administered 2016-02-07 (×2): 650 mg via ORAL
  Filled 2016-02-05 (×2): qty 2

## 2016-02-05 MED ORDER — FUROSEMIDE 10 MG/ML IJ SOLN
40.0000 mg | Freq: Once | INTRAMUSCULAR | Status: AC
  Administered 2016-02-05: 40 mg via INTRAVENOUS
  Filled 2016-02-05: qty 4

## 2016-02-05 MED ORDER — LEVALBUTEROL HCL 0.63 MG/3ML IN NEBU
0.6300 mg | INHALATION_SOLUTION | Freq: Three times a day (TID) | RESPIRATORY_TRACT | Status: DC
  Administered 2016-02-05 – 2016-02-06 (×2): 0.63 mg via RESPIRATORY_TRACT
  Filled 2016-02-05 (×2): qty 3

## 2016-02-05 MED ORDER — TRAMADOL HCL 50 MG PO TABS
50.0000 mg | ORAL_TABLET | ORAL | Status: DC | PRN
  Administered 2016-02-05 – 2016-02-10 (×6): 50 mg via ORAL
  Filled 2016-02-05 (×6): qty 1

## 2016-02-05 NOTE — Progress Notes (Addendum)
[address]       [place] [address]             [phone]      3 Days Post-Op Procedure(s) (LRB): CORONARY ARTERY BYPASS GRAFTING (CABG) times 4 using left internal mammary and left saphenous ven harvested by endoven  (N/A) TRANSESOPHAGEAL ECHOCARDIOGRAM (TEE) (N/A)   Subjective:  Trevor Ward is confused this morning.  He knows he is in the hospital but otherwise is having hallucinations.  Per his wife he is sleeping most of the day.  Very poor oral intake.  She has been giving him gatorade.  He has only urinated 400 cc since 1145 yesterday.  Spoke with wife and stated patient may not sleep all day it will worsen his confusion and then he will be up all night.  The patient was instructed to be up in chair today and he would need to ambulate.  Objective: Vital signs in last 24 hours: Temp:  [98.2 F (36.8 C)-99.6 F (37.6 C)] 98.4 F (36.9 C) (05/13 0454) Pulse Rate:  [87-114] 110 (05/13 0454) Cardiac Rhythm:  [-] Sinus tachycardia (05/12 1900) Resp:  [17-30] 19 (05/13 0454) BP: (106-148)/(66-83) 148/76 mmHg (05/13 0454) SpO2:  [92 %-100 %] 93 % (05/13 0454) Weight:  [197 lb 12 oz (89.7 kg)] 197 lb 12 oz (89.7 kg) (05/13 0454)  Intake/Output from previous day: 05/12 0701 - 05/13 0700 In: 170 [P.O.:120; IV Piggyback:50] Out: 87 [Urine:800; Chest Tube:20]  General appearance: alert, cooperative and no distress Heart: regular rate and rhythm and tachy Lungs: wheezes bilaterally Abdomen: soft, + distention, good BS Extremities: edema none present Wound: EVH sites are clean and dry, sternotomy dressing was removed and there was a moderate amount of purulent drainage along inferior portion... appeared to be fat necrosis, clear serous drainage present after wound cleaned  Lab Results:  Recent Labs  02/04/16 0309 02/05/16 0525  WBC 15.1* 11.2*  HGB 12.0* 11.6*  HCT 36.9* 35.1*  PLT 148* 161   BMET:  Recent Labs  02/04/16 0309 02/05/16 0525    NA 134* 132*  K 4.5 4.1  CL 101 98*  CO2 24 23  GLUCOSE 110* 135*  BUN 15 20  CREATININE 0.96 0.96  CALCIUM 8.5* 8.3*    PT/INR:  Recent Labs  02/02/16 1512  LABPROT 17.3*  INR 1.41   ABG    Component Value Date/Time   PHART 7.324* 02/02/2016 2046   HCO3 22.1 02/02/2016 2046   TCO2 23 02/03/2016 1650   ACIDBASEDEF 4.0* 02/02/2016 2046   O2SAT 91.0 02/02/2016 2046   CBG (last 3)   Recent Labs  02/04/16 1617 02/04/16 2142 02/05/16 0619  GLUCAP 91 97 128*    Assessment/Plan: S/P Procedure(s) (LRB): CORONARY ARTERY BYPASS GRAFTING (CABG) times 4 using left internal mammary and left saphenous ven harvested by endoven  (N/A) TRANSESOPHAGEAL ECHOCARDIOGRAM (TEE) (N/A)  1. CV- Sinus Tach- will increase Lopressor to 25 mg BID, mild HTN- monitor will likely be able to start ACE prior to discharge 2. Pulm- no acute issues, off oxygen, some wheezing on exam... Will add prn nebs, encouraged use of IS 3. Renal- creatinine WNL, poor U/O...likely due to poor oral intake and mild dehydration, will d/c Lasix, encouraged patient to drink fluids.... However will benefit from IV fluid bolus... Will discuss with staff 4. ID- fat necrosis present on inferior portion of sternotomy, wound cleaned, will follow, no fever or leukocytosis present 5. CBGs- will stop SSIP,  patient borderline diabetic, will get diabetes education consult... However if patient's lifestyle does not improve will likely require treatment in near future 6. Dipso- patient with confusion, will stop narcotics, will speak with Dr. Servando Snare about fluid bolus, increase Lopressor for Tachycardia, EPW removed this morning.  Continue current care   LOS: 4 days    BARRETT, ERIN 02/05/2016  Mildly confused, orientated to person and place and why heis here Foley out voided twice since out  Not eating much  Cr stable Monitor urine output  I have seen and examined Trevor Ward and agree with the above assessment  and  plan.  Trevor Isaac MD Beeper [phone] Office [phone] 02/05/2016 10:48 AM

## 2016-02-05 NOTE — Progress Notes (Signed)
Cardiac Rehab Phase I  Attempted to walk with pt.  Pt complained of nausea (going to be sick).  Assisted RN to get pt to chair for bath.  We will try to walk with pt again later in day if able.  Alberteen Sam, MA, ACSM RCEP 02/05/2016 11:19 AM

## 2016-02-05 NOTE — Plan of Care (Signed)
Problem: Food- and Nutrition-Related Knowledge Deficit (NB-1.1) Goal: Nutrition education Formal process to instruct or train a patient/client in a skill or to impart knowledge to help patients/clients voluntarily manage or modify food choices and eating behavior to maintain or improve health. Outcome: Adequate for Discharge  RD consulted for nutrition education regarding pre-diabetes.     Lab Results  Component Value Date    HGBA1C 6.4* 02/02/2016    RD provided "Carbohydrate Counting for People with Diabetes" handout from the Academy of [employer] and [employer]etics. Discussed different food groups and their effects on blood sugar, emphasizing carbohydrate-containing foods.   Reviewed label reading and basic of consuming healthy diet. Emphasized portion control.   Discussed importance of controlled and consistent carbohydrate intake throughout the day. Provided examples of ways to balance meals/snacks and encouraged intake of high-fiber, whole grain complex carbohydrates. Teach back method used.  Expect good compliance.   Body mass index is 30.08 kg/(m^2). Pt meets criteria for obese based on current BMI.  Current diet order is CHO modified/Heart Healthy, patient is consuming approximately 75% of meals at this time. Labs and medications reviewed.  Recommended he discuss with MD regarding when he would be appropriate to add walking or some type of aerobic exercise daily to help with his blood glucose management and heart health. He would benefit from gradual  weight loss 2-4# per month.  No further nutrition interventions warranted at this time. RD contact information provided. If additional nutrition issues arise, please re-consult RD.  Colman Cater MS,RD,CSG,LDN Office: [phone] Pager: [phone]

## 2016-02-05 NOTE — Progress Notes (Addendum)
Pacing wires were removed this AM by PAC, patient and significant other reminded that patient  to stay in bed approximately one hour. BP 122/71 patient Sinus on monitor  Heart rate 101 will monitor patient. Justus Droke, Bettina Gavia RN

## 2016-02-05 NOTE — Progress Notes (Signed)
Received diabetes coordinator consult. Patient's HgbA1c is 6.4% or prediabetes. To have staff RN's give exit notes to patient on diabetes before discharge. Ordered a dietician to see patient to give him meal planning instruction. Will continue to follow while in the hospital. Harvel Ricks RN BSN CDE

## 2016-02-06 ENCOUNTER — Inpatient Hospital Stay (HOSPITAL_COMMUNITY): Payer: Medicare Other

## 2016-02-06 LAB — BASIC METABOLIC PANEL
Anion gap: 13 (ref 5–15)
BUN: 21 mg/dL — ABNORMAL HIGH (ref 6–20)
CO2: 25 mmol/L (ref 22–32)
Calcium: 8.2 mg/dL — ABNORMAL LOW (ref 8.9–10.3)
Chloride: 97 mmol/L — ABNORMAL LOW (ref 101–111)
Creatinine, Ser: 0.87 mg/dL (ref 0.61–1.24)
GFR calc Af Amer: >60 mL/min (ref >60)
GFR calc non Af Amer: >60 mL/min (ref >60)
Glucose, Bld: 124 mg/dL — ABNORMAL HIGH (ref 65–99)
Potassium: 3.9 mmol/L (ref 3.5–5.1)
Sodium: 135 mmol/L (ref 135–145)

## 2016-02-06 LAB — CBC
HCT: 37 % — ABNORMAL LOW (ref 39.0–52.0)
Hemoglobin: 12.3 g/dL — ABNORMAL LOW (ref 13.0–17.0)
MCH: 29.5 pg (ref 26.0–34.0)
MCHC: 33.2 g/dL (ref 30.0–36.0)
MCV: 88.7 fL (ref 78.0–100.0)
Platelets: 214 10*3/uL (ref 150–400)
RBC: 4.17 MIL/uL — ABNORMAL LOW (ref 4.22–5.81)
RDW: 14.4 % (ref 11.5–15.5)
WBC: 10.9 10*3/uL — ABNORMAL HIGH (ref 4.0–10.5)

## 2016-02-06 LAB — AMYLASE: Amylase: 38 U/L (ref 28–100)

## 2016-02-06 LAB — LIPASE, BLOOD: Lipase: 17 U/L (ref 11–51)

## 2016-02-06 MED ORDER — DEXTROSE-NACL 5-0.45 % IV SOLN
INTRAVENOUS | Status: DC
  Administered 2016-02-06 – 2016-02-07 (×2): via INTRAVENOUS

## 2016-02-06 MED ORDER — LEVALBUTEROL HCL 0.63 MG/3ML IN NEBU
0.6300 mg | INHALATION_SOLUTION | Freq: Two times a day (BID) | RESPIRATORY_TRACT | Status: DC
  Administered 2016-02-06 – 2016-02-07 (×2): 0.63 mg via RESPIRATORY_TRACT
  Filled 2016-02-06 (×2): qty 3

## 2016-02-06 NOTE — Progress Notes (Addendum)
[address]       [place] [address]             [phone]      4 Days Post-Op Procedure(s) (LRB): CORONARY ARTERY BYPASS GRAFTING (CABG) times 4 using left internal mammary and left saphenous ven harvested by endoven  (N/A) TRANSESOPHAGEAL ECHOCARDIOGRAM (TEE) (N/A)   Subjective:  Trevor Ward had a rough night.  He vomited several times overnight and does not feel very good this morning.  He continues to have poor oral intake.  Patient's wife states he moved his bowels, yesterday but nursing has no record of this  Objective: Vital signs in last 24 hours: Temp:  [98.2 F (36.8 C)-99 F (37.2 C)] 98.7 F (37.1 C) (05/14 0700) Pulse Rate:  [88-112] 112 (05/14 0700) Cardiac Rhythm:  [-] Sinus tachycardia (05/13 2110) Resp:  [18-26] 18 (05/14 0700) BP: (122-162)/(71-91) 136/81 mmHg (05/14 0700) SpO2:  [91 %-96 %] 91 % (05/14 0700) FiO2 (%):  [94 %] 94 % (05/13 2227) Weight:  [198 lb 6.6 oz (90 kg)] 198 lb 6.6 oz (90 kg) (05/14 0500)  Intake/Output from previous day: 05/13 0701 - 05/14 0700 In: 250 [P.O.:240; I.V.:10] Out: 1150 [Urine:1150]  General appearance: alert, cooperative and no distress Heart: regular rate and rhythm and tachy Lungs: diminished breath sounds bibasilar Abdomen: soft, non-tender; bowel sounds normal; no masses,  no organomegaly Extremities: edema none appreciated Wound: clean and dry  Lab Results:  Recent Labs  02/05/16 0525 02/06/16 0408  WBC 11.2* 10.9*  HGB 11.6* 12.3*  HCT 35.1* 37.0*  PLT 161 214   BMET:  Recent Labs  02/05/16 0525 02/06/16 0408  NA 132* 135  K 4.1 3.9  CL 98* 97*  CO2 23 25  GLUCOSE 135* 124*  BUN 20 21*  CREATININE 0.96 0.87  CALCIUM 8.3* 8.2*    PT/INR: No results for input(s): LABPROT, INR in the last 72 hours. ABG    Component Value Date/Time   PHART 7.324* 02/02/2016 2046   HCO3 22.1 02/02/2016 2046   TCO2 23 02/03/2016 1650   ACIDBASEDEF 4.0* 02/02/2016 2046   O2SAT 91.0  02/02/2016 2046   CBG (last 3)   Recent Labs  02/04/16 1617 02/04/16 2142 02/05/16 0619  GLUCAP 91 97 128*     Assessment/Plan: S/P Procedure(s) (LRB): CORONARY ARTERY BYPASS GRAFTING (CABG) times 4 using left internal mammary and left saphenous ven harvested by endoven  (N/A) TRANSESOPHAGEAL ECHOCARDIOGRAM (TEE) (N/A)  1. CV- Sinus Tachy- continue Lopressor, will continue at current dose, see if improves with hydration if not will increase dose 2. Pulm- small bilateral effusions, off oxygen, continue IS 3. Renal- creatinine WNL, weight is below baseline, due to poor oral intake will start D5 1/2 NS at 50 ml/hr. 4. GI- nausea, vomiting... No BM recorded, abd distention improved from yesterday, good BS... Will continue zofran, reglan, check abdominal film to rule out Ileus... Amylase and Lipase are normal 5. ID- serous drainage remains from inferior portion of sternotomy, no signs of infection, continue to monitor 6. Dispo- patient stable, GI care, ABD film, start fluids   LOS: 5 days    BARRETT, ERIN 02/06/2016  abdomen not distended, bowel sounds present  Lipase and  Amylase  Nl Wbc nl Iv fluid until able to take po better  I have seen and examined Trevor Ward and agree with the above assessment  and plan.  Grace Isaac MD Beeper [phone] Office [phone] 02/06/2016  10:59 AM

## 2016-02-06 NOTE — Progress Notes (Signed)
Lower aspect of MSI with Dressing Saturated. Changed and incision painted with betadine will monitor patient. Danford Tat, Bettina Gavia RN

## 2016-02-07 ENCOUNTER — Telehealth: Payer: Self-pay | Admitting: Cardiovascular Disease

## 2016-02-07 LAB — CBC
HCT: 33.6 % — ABNORMAL LOW (ref 39.0–52.0)
Hemoglobin: 11.3 g/dL — ABNORMAL LOW (ref 13.0–17.0)
MCH: 29.6 pg (ref 26.0–34.0)
MCHC: 33.6 g/dL (ref 30.0–36.0)
MCV: 88 fL (ref 78.0–100.0)
Platelets: 220 10*3/uL (ref 150–400)
RBC: 3.82 MIL/uL — ABNORMAL LOW (ref 4.22–5.81)
RDW: 14.4 % (ref 11.5–15.5)
WBC: 7.7 10*3/uL (ref 4.0–10.5)

## 2016-02-07 LAB — COMPREHENSIVE METABOLIC PANEL
ALT: 22 U/L (ref 17–63)
AST: 28 U/L (ref 15–41)
Albumin: 2.3 g/dL — ABNORMAL LOW (ref 3.5–5.0)
Alkaline Phosphatase: 58 U/L (ref 38–126)
Anion gap: 8 (ref 5–15)
BUN: 13 mg/dL (ref 6–20)
CO2: 28 mmol/L (ref 22–32)
Calcium: 8.1 mg/dL — ABNORMAL LOW (ref 8.9–10.3)
Chloride: 100 mmol/L — ABNORMAL LOW (ref 101–111)
Creatinine, Ser: 0.93 mg/dL (ref 0.61–1.24)
GFR calc Af Amer: >60 mL/min (ref >60)
GFR calc non Af Amer: >60 mL/min (ref >60)
Glucose, Bld: 140 mg/dL — ABNORMAL HIGH (ref 65–99)
Potassium: 3.6 mmol/L (ref 3.5–5.1)
Sodium: 136 mmol/L (ref 135–145)
Total Bilirubin: 1.1 mg/dL (ref 0.3–1.2)
Total Protein: 5.6 g/dL — ABNORMAL LOW (ref 6.5–8.1)

## 2016-02-07 MED ORDER — VANCOMYCIN HCL 10 G IV SOLR
1500.0000 mg | Freq: Once | INTRAVENOUS | Status: AC
  Administered 2016-02-07: 1500 mg via INTRAVENOUS
  Filled 2016-02-07: qty 1500

## 2016-02-07 MED ORDER — LACTULOSE 10 GM/15ML PO SOLN: 20.0000 g | Freq: Every day | ORAL | Status: DC | PRN

## 2016-02-07 MED ORDER — VANCOMYCIN HCL 10 G IV SOLR: 1250.0000 mg | INTRAVENOUS | Status: DC

## 2016-02-07 MED ORDER — LEVALBUTEROL HCL 0.63 MG/3ML IN NEBU: 0.6300 mg | INHALATION_SOLUTION | Freq: Four times a day (QID) | RESPIRATORY_TRACT | Status: DC | PRN

## 2016-02-07 MED ORDER — LISINOPRIL 5 MG PO TABS
5.0000 mg | ORAL_TABLET | Freq: Every day | ORAL | Status: DC
  Administered 2016-02-07: 5 mg via ORAL
  Filled 2016-02-07 (×2): qty 1

## 2016-02-07 NOTE — Progress Notes (Signed)
Pharmacy Antibiotic Note  Trevor Ward is a 75 y.o. male s/p CABG 5/12 with sternal wound cellulitis.  Pharmacy has been consulted for Vancomycin  dosing.  Plan: Vancomycin 1500 mg IV now, then 1250 mg IV q24h  Height: 5\' 8"  (172.7 cm) Weight: 201 lb 4.5 oz (91.3 kg) IBW/kg (Calculated) : 68.4  Temp (24hrs), Avg:98.1 F (36.7 C), Min:97.6 F (36.4 C), Max:98.3 F (36.8 C)   Recent Labs Lab 02/03/16 1648 02/03/16 1650 02/04/16 0309 02/05/16 0525 02/06/16 0408 02/07/16 0340  WBC 14.9*  --  15.1* 11.2* 10.9* 7.7  CREATININE 1.06 0.90 0.96 0.96 0.87 0.93    Estimated Creatinine Clearance: 73 mL/min (by C-G formula based on Cr of 0.93).    Allergies  Allergen Reactions  . Statins Other (See Comments)    Severe myalgias    Antimicrobials this admission: Vancomycin 5/10, 5/15 >>    Caryl Pina 02/07/2016 7:45 AM

## 2016-02-07 NOTE — Evaluation (Signed)
Physical Therapy Evaluation Patient Details Name: Trevor Ward MRN: [id number] DOB: Nov 19, 1940 Today's Date: 02/07/2016   History of Present Illness  Pt s/p CABG. PMH - small rt CVA, HTN, PVD, rt CEA  Clinical Impression  Pt admitted with above diagnosis and presents to PT with functional limitations due to deficits listed below (See PT problem list). Pt needs skilled PT to maximize independence and safety to allow discharge to home. Pt doing very well and don't anticipate any problem with him returning home.     Follow Up Recommendations No PT follow up    Equipment Recommendations  None recommended by PT    Recommendations for Other Services       Precautions / Restrictions Precautions Precautions: None      Mobility  Bed Mobility Overal bed mobility: Needs Assistance Bed Mobility: Sidelying to Sit;Rolling;Sit to Supine Rolling: Min guard Sidelying to sit: Min assist   Sit to supine: Supervision   General bed mobility comments: Assist to elevate trunk due to sternal precautions  Transfers Overall transfer level: Needs assistance Equipment used: 4-wheeled walker Transfers: Sit to/from Stand Sit to Stand: Min guard         General transfer comment: Verbal cues for hand placement to follow sternal precautions. Assist for safety.  Ambulation/Gait Ambulation/Gait assistance: Supervision Ambulation Distance (Feet): 350 Feet Assistive device: 4-wheeled walker;None Gait Pattern/deviations: Step-through pattern;Decreased stride length   Gait velocity interpretation: at or above normal speed for age/gender General Gait Details: Pt with steady gait with rollator. Without rollator pt with slight unsteadiness but no loss of balance. VSS.  Stairs            Wheelchair Mobility    Modified Rankin (Stroke Patients Only)       Balance Overall balance assessment: Needs assistance Sitting-balance support: No upper extremity supported;Feet supported Sitting  balance-Leahy Scale: Normal     Standing balance support: No upper extremity supported;During functional activity Standing balance-Leahy Scale: Good                               Pertinent Vitals/Pain      Home Living Family/patient expects to be discharged to:: Private residence Living Arrangements: Spouse/significant other Available Help at Discharge: Family;Friend(s);Available PRN/intermittently Type of Home: House Home Access: Stairs to enter Entrance Stairs-Rails: Left Entrance Stairs-Number of Steps: 5-6 Home Layout: One level Home Equipment: Cane - single point;Walker - 2 wheels;Walker - 4 wheels (daughter has equipment available)      Prior Function Level of Independence: Independent         Comments: 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     Hand Dominance   Dominant Hand: Right    Extremity/Trunk Assessment   Upper Extremity Assessment: Overall WFL for tasks assessed           Lower Extremity Assessment: Overall WFL for tasks assessed         Communication   Communication: HOH  Cognition Arousal/Alertness: Awake/alert Behavior During Therapy: WFL for tasks assessed/performed Overall Cognitive Status: Within Functional Limits for tasks assessed                 General Comments: Has gotten up on his own but think this is due to his baseline independent nature    General Comments      Exercises  Assessment/Plan    PT Assessment Patient needs continued PT services  PT Diagnosis Difficulty walking   PT Problem List Decreased balance;Decreased mobility  PT Treatment Interventions DME instruction;Gait training;Functional mobility training;Therapeutic activities;Stair training;Balance training;Therapeutic exercise;Patient/family education   PT Goals (Current goals can be found in the Care Plan section) Acute Rehab PT Goals Patient Stated  Goal: return home PT Goal Formulation: With patient Time For Goal Achievement: 02/14/16 Potential to Achieve Goals: Good    Frequency Min 3X/week   Barriers to discharge        Co-evaluation               End of Session Equipment Utilized During Treatment: Gait belt Activity Tolerance: Patient tolerated treatment well Patient left: in bed;with call bell/phone within reach;with bed alarm set Nurse Communication: Mobility status         Time: [id number] PT Time Calculation (min) (ACUTE ONLY): 19 min   Charges:   PT Evaluation $PT Eval Moderate Complexity: 1 Procedure     PT G Codes:        Claramae Rigdon 02/14/2016, 3:40 PM [facility] PT [phone]

## 2016-02-07 NOTE — Progress Notes (Addendum)
      [address]       [place] [address]             [phone]      5 Days Post-Op Procedure(s) (LRB): CORONARY ARTERY BYPASS GRAFTING (CABG) times 4 using left internal mammary and left saphenous ven harvested by endoven  (N/A) TRANSESOPHAGEAL ECHOCARDIOGRAM (TEE) (N/A)   Subjective:  Trevor Ward is feeling a little better this morning.  His oral intake is improving.  Nursing still states he has not moved his bowels, but his wife states he has.  Objective: Vital signs in last 24 hours: Temp:  [97.6 F (36.4 C)-98.3 F (36.8 C)] 98.3 F (36.8 C) (05/15 0406) Pulse Rate:  [89-110] 90 (05/15 0406) Cardiac Rhythm:  [-] Sinus tachycardia (05/14 2000) Resp:  [18-22] 20 (05/15 0406) BP: (131-145)/(68-84) 138/77 mmHg (05/15 0406) SpO2:  [92 %-94 %] 94 % (05/15 0406) Weight:  [201 lb 4.5 oz (91.3 kg)] 201 lb 4.5 oz (91.3 kg) (05/15 0412)  Intake/Output from previous day: 05/14 0701 - 05/15 0700 In: 418.3 [I.V.:418.3] Out: 775 [Urine:775]  General appearance: alert, cooperative and no distress Heart: regular rate and rhythm and tachy Lungs: clear to auscultation bilaterally Abdomen: soft non tender, more distended than yesterday Extremities: edema trace Wound: clean and dry  Lab Results:  Recent Labs  02/06/16 0408 02/07/16 0340  WBC 10.9* 7.7  HGB 12.3* 11.3*  HCT 37.0* 33.6*  PLT 214 220   BMET:  Recent Labs  02/06/16 0408 02/07/16 0340  NA 135 136  K 3.9 3.6  CL 97* 100*  CO2 25 28  GLUCOSE 124* 140*  BUN 21* 13  CREATININE 0.87 0.93  CALCIUM 8.2* 8.1*    PT/INR: No results for input(s): LABPROT, INR in the last 72 hours. ABG    Component Value Date/Time   PHART 7.324* 02/02/2016 2046   HCO3 22.1 02/02/2016 2046   TCO2 23 02/03/2016 1650   ACIDBASEDEF 4.0* 02/02/2016 2046   O2SAT 91.0 02/02/2016 2046   CBG (last 3)   Recent Labs  02/04/16 1617 02/04/16 2142 02/05/16 0619  GLUCAP 91 97 128*    Assessment/Plan: S/P  Procedure(s) (LRB): CORONARY ARTERY BYPASS GRAFTING (CABG) times 4 using left internal mammary and left saphenous ven harvested by endoven  (N/A) TRANSESOPHAGEAL ECHOCARDIOGRAM (TEE) (N/A)  1. CV- Sinus Tach- rate has improved on Lopressor, remains hypertensive, will start low dose ACE 2. Pulm- no acute issues, off oxygen, continue IS 3. Renal- creatinine remains stable, PO intake has improved, will decrease IV Fluids to 25 ml/hr and hopefully d/c later today... No LE edema present 4 Dispo- patient doing better, add ACE for BP,  Will get PT consult   LOS: 6 days    BARRETT, ERIN 02/07/2016  Some drainage lower sternal wound with cough, culture done today will wait on antibiotics today as no fever or elevated WBC, looks like serous drainage  Feels better today, nausea has cleared , ambulated 400 feet , BM today . I have seen and examined Trevor Ward and agree with the above assessment  and plan.  Grace Isaac MD Beeper [phone] Office [phone] 02/07/2016 11:40 AM

## 2016-02-07 NOTE — Telephone Encounter (Signed)
Closed enocunter °

## 2016-02-07 NOTE — Care Management Important Message (Signed)
Important Message  Patient Details  Name: Trevor Ward MRN: [id number] Date of Birth: 02/01/1941   Medicare Important Message Given:  Yes    Dawayne Patricia, RN 02/07/2016, 2:54 PM

## 2016-02-07 NOTE — Progress Notes (Signed)
CARDIAC REHAB PHASE I   PRE:  Rate/Rhythm: 96 SR  BP:  Supine:   Sitting: 124/66  Standing:    SaO2: 96%RA  MODE:  Ambulation: 450 ft   POST:  Rate/Rhythm: 101ST  BP:  Supine:   Sitting: 144/82  Standing:    SaO2: 97%RA 0906-0930 Pt walked 450 ft on RA with gait belt use, rollator, and asst x 2. Can be asst x 1 with RW next walk. Tends to get a little fast. Did not need to sit to rest. To recliner with chair alarm. Family in room. Gait steady just encourage to slow down.   Graylon Good, RN BSN  02/07/2016 9:26 AM

## 2016-02-07 NOTE — Progress Notes (Signed)
Utilization review completed.  

## 2016-02-07 NOTE — Progress Notes (Signed)
Patient with BM this AM also patient dressing changed Dr. Servando Snare in room to see patient , clean dressing reapplied. Will monitor patient. Prentiss Polio, Bettina Gavia RN

## 2016-02-07 NOTE — Progress Notes (Signed)
Patient assist x1 gait unsteady to chair for breakfast. Will monitor patient. Durga Saldarriaga, Bettina Gavia RN

## 2016-02-08 DIAGNOSIS — I10 Essential (primary) hypertension: Secondary | ICD-10-CM

## 2016-02-08 DIAGNOSIS — Z951 Presence of aortocoronary bypass graft: Secondary | ICD-10-CM

## 2016-02-08 DIAGNOSIS — I200 Unstable angina: Secondary | ICD-10-CM

## 2016-02-08 DIAGNOSIS — H9190 Unspecified hearing loss, unspecified ear: Secondary | ICD-10-CM

## 2016-02-08 LAB — CBC
HCT: 34.2 % — ABNORMAL LOW (ref 39.0–52.0)
Hemoglobin: 11.2 g/dL — ABNORMAL LOW (ref 13.0–17.0)
MCH: 28.9 pg (ref 26.0–34.0)
MCHC: 32.7 g/dL (ref 30.0–36.0)
MCV: 88.4 fL (ref 78.0–100.0)
Platelets: 275 10*3/uL (ref 150–400)
RBC: 3.87 MIL/uL — ABNORMAL LOW (ref 4.22–5.81)
RDW: 14.7 % (ref 11.5–15.5)
WBC: 7.6 10*3/uL (ref 4.0–10.5)

## 2016-02-08 LAB — BASIC METABOLIC PANEL
Anion gap: 11 (ref 5–15)
BUN: 15 mg/dL (ref 6–20)
CO2: 25 mmol/L (ref 22–32)
Calcium: 8.2 mg/dL — ABNORMAL LOW (ref 8.9–10.3)
Chloride: 100 mmol/L — ABNORMAL LOW (ref 101–111)
Creatinine, Ser: 0.84 mg/dL (ref 0.61–1.24)
GFR calc Af Amer: >60 mL/min (ref >60)
GFR calc non Af Amer: >60 mL/min (ref >60)
Glucose, Bld: 105 mg/dL — ABNORMAL HIGH (ref 65–99)
Potassium: 3.7 mmol/L (ref 3.5–5.1)
Sodium: 136 mmol/L (ref 135–145)

## 2016-02-08 MED ORDER — LOSARTAN POTASSIUM 50 MG PO TABS
50.0000 mg | ORAL_TABLET | Freq: Every day | ORAL | Status: DC
  Administered 2016-02-09 – 2016-02-10 (×2): 50 mg via ORAL
  Filled 2016-02-08 (×2): qty 1

## 2016-02-08 NOTE — Progress Notes (Addendum)
      [address]       [place] [address]             [phone]      6 Days Post-Op Procedure(s) (LRB): CORONARY ARTERY BYPASS GRAFTING (CABG) times 4 using left internal mammary and left saphenous ven harvested by endoven  (N/A) TRANSESOPHAGEAL ECHOCARDIOGRAM (TEE) (N/A)   Subjective:  Mr. Trevor Ward is doing better.  He continues to cough.  He ambulated yesterday with assistance.  + BM  Objective: Vital signs in last 24 hours: Temp:  [98.3 F (36.8 C)-98.8 F (37.1 C)] 98.8 F (37.1 C) (05/16 0543) Pulse Rate:  [84-100] 91 (05/16 0543) Cardiac Rhythm:  [-] Normal sinus rhythm (05/15 1922) Resp:  [16-18] 18 (05/16 0543) BP: (111-130)/(62-72) 111/62 mmHg (05/16 0543) SpO2:  [92 %-94 %] 93 % (05/16 0543) Weight:  [194 lb 9.6 oz (88.27 kg)] 194 lb 9.6 oz (88.27 kg) (05/16 0543)  Intake/Output from previous day: 05/15 0701 - 05/16 0700 In: 1160 [P.O.:480; I.V.:680] Out: 1931 [Urine:1480; Emesis/NG output:450; Stool:1]  General appearance: alert, cooperative and no distress Heart: regular rate and rhythm Lungs: clear to auscultation bilaterally Abdomen: soft, non-tender; bowel sounds normal; no masses,  no organomegaly Extremities: edema trace Wound: continues to have serous drainage  Lab Results:  Recent Labs  02/07/16 0340 02/08/16 0405  WBC 7.7 7.6  HGB 11.3* 11.2*  HCT 33.6* 34.2*  PLT 220 275   BMET:  Recent Labs  02/07/16 0340 02/08/16 0405  NA 136 136  K 3.6 3.7  CL 100* 100*  CO2 28 25  GLUCOSE 140* 105*  BUN 13 15  CREATININE 0.93 0.84  CALCIUM 8.1* 8.2*    PT/INR: No results for input(s): LABPROT, INR in the last 72 hours. ABG    Component Value Date/Time   PHART 7.324* 02/02/2016 2046   HCO3 22.1 02/02/2016 2046   TCO2 23 02/03/2016 1650   ACIDBASEDEF 4.0* 02/02/2016 2046   O2SAT 91.0 02/02/2016 2046   CBG (last 3)  No results for input(s): GLUCAP in the last 72 hours.  Assessment/Plan: S/P Procedure(s)  (LRB): CORONARY ARTERY BYPASS GRAFTING (CABG) times 4 using left internal mammary and left saphenous ven harvested by endoven  (N/A) TRANSESOPHAGEAL ECHOCARDIOGRAM (TEE) (N/A)  1. CV- hemodynamically stable- continue lopressor 2. Pulm-wean oxygen as tolerated 3. Renal- creatinine remains stable, off fluids, U/O good, no LE edema remains below admission weight 4. GI- has moved bowels, continue bowel care 5. ID- wound culture obtained, remains afebrile, + serous drainage from inferior portion of sternotomy 6. Dispo- patient stable, remains stable, PT consult remains pending, continue current care   LOS: 7 days    BARRETT, ERIN 02/08/2016  Serous drainage from lower sternum decreased, culture negative so far  Slowing gaining  strength and less confused  I have seen and examined Shanna Cisco and agree with the above assessment  and plan.  Grace Isaac MD Beeper [phone] Office [phone] 02/08/2016 12:16 PM

## 2016-02-08 NOTE — Progress Notes (Signed)
Subjective:  Up with PT but requires a lot of encouragment  Objective:  Vital Signs in the last 24 hours: Temp:  [98.3 F (36.8 C)-98.8 F (37.1 C)] 98.8 F (37.1 C) (05/16 0543) Pulse Rate:  [84-100] 100 (05/16 0953) Resp:  [16-18] 18 (05/16 0543) BP: (111-130)/(62-79) 115/79 mmHg (05/16 0953) SpO2:  [92 %-94 %] 93 % (05/16 0543) Weight:  [194 lb 9.6 oz (88.27 kg)] 194 lb 9.6 oz (88.27 kg) (05/16 0543)  Intake/Output from previous day:  Intake/Output Summary (Last 24 hours) at 02/08/16 0956 Last data filed at 02/07/16 1756  Gross per 24 hour  Intake    240 ml  Output   1330 ml  Net  -1090 ml    Physical Exam: General appearance: alert, cooperative, no distress and HOH Lungs: scattered crackles bases Heart: regular rate and rhythm Extremities: trace edema Neurologic: Grossly normal   Rate: 98  Rhythm: normal sinus rhythm and sinus tachycardia  Lab Results:  Recent Labs  02/07/16 0340 02/08/16 0405  WBC 7.7 7.6  HGB 11.3* 11.2*  PLT 220 275    Recent Labs  02/07/16 0340 02/08/16 0405  NA 136 136  K 3.6 3.7  CL 100* 100*  CO2 28 25  GLUCOSE 140* 105*  BUN 13 15  CREATININE 0.93 0.84   No results for input(s): TROPONINI in the last 72 hours.  Invalid input(s): CK, MB No results for input(s): INR in the last 72 hours.  Scheduled Meds: . allopurinol  100 mg Oral BID  . amitriptyline  10 mg Oral QHS  . aspirin EC  81 mg Oral Daily  . docusate sodium  200 mg Oral Daily  . enoxaparin (LOVENOX) injection  40 mg Subcutaneous QHS  . lisinopril  5 mg Oral Daily  . metoprolol tartrate  25 mg Oral BID  . pantoprazole  40 mg Oral QAC breakfast  . rosuvastatin  5 mg Oral Daily  . sodium chloride flush  3 mL Intravenous Q12H  . vitamin B-12  500 mcg Oral Daily   Continuous Infusions:  PRN Meds:.sodium chloride, acetaminophen, bisacodyl **OR** bisacodyl, guaiFENesin, lactulose, levalbuterol, ondansetron **OR** ondansetron (ZOFRAN) IV, oxyCODONE, sodium  chloride flush, traMADol   Imaging: Imaging results have been reviewed  Cardiac Studies: Echo 02/01/16 ------------------------------------------------------------------- LV EF: 60% - 65%  ------------------------------------------------------------------- Study Conclusions  - Left ventricle: The cavity size was normal. There was moderate  concentric hypertrophy. Systolic function was normal. The  estimated ejection fraction was in the range of 60% to 65%. Wall  motion was normal; there were no regional wall motion  abnormalities. Doppler parameters are consistent with abnormal  left ventricular relaxation (grade 1 diastolic dysfunction).  Doppler parameters are consistent with elevated ventricular  end-diastolic filling pressure. - Aortic valve: Possibly bicuspid; moderately thickened, moderately  calcified leaflets. There was mild regurgitation. Peak gradient  (S): 14 mm Hg. Mean gradient 7 mmHg. Valve area (VTI): 2.35 cm^2.  Valve area (Vmax): 2.2 cm^2. Valve area (Vmean): 2.16 cm^2. - Aortic root: The aortic root was normal in size. - Ascending aorta: The ascending aorta was not visualized. - Mitral valve: Structurally normal valve. There was mild  regurgitation. - Left atrium: The atrium was mildly dilated. - Right ventricle: Systolic function was normal. - Right atrium: The atrium was normal in size. - Tricuspid valve: There was trivial regurgitation. - Pulmonary arteries: Systolic pressure was within the normal  range. - Inferior vena cava: The vessel was normal in size. - Pericardium, extracardiac: There  was no pericardial effusion.   Assessment/Plan:  75 y.o. male with a history of previous small stroke and carotid artery stenosis status post urgent right carotid endarterectomy in July 2016. He was seen by Dr Sallyanne Kuster in the office 01/28/16 for evaluation of chest pain and DOE c/w angina. Dr Sallyanne Kuster felt there was a high likelihood the pt had CAD and he  was admitted for coronary angiogram. This was done 02/01/16 and revealed severe 3V CAD and normal LVF. The pt had elective CABG x 4 on 02/02/16. Post op he has done well but has been a little slow to mobilize.   Principal Problem:   Unstable angina (HCC) Active Problems:   S/P CABG x 4 02/02/16   Essential hypertension   History of CVA (cerebrovascular accident)   Dyslipidemia   History of Rt CA endarterectomy   HOH (hard of hearing)   PLAN: F/U arranged.   Kerin Ransom PA-C 02/08/2016, 9:56 AM [phone]  I have seen and examined the patient along with Kerin Ransom PA-C.  I have reviewed the chart, notes and new data.  I agree with PA/NP's note.  Key new complaints: nausea, anorexia Key examination changes: basilar atelectatic crackles   PLAN: Lisinopril was giving him nausea preop. He thinks the same medication might be a problem now. Will switch to ARB.  Sanda Klein, MD, [place] [phone] 02/08/2016, 1:20 PM

## 2016-02-08 NOTE — Progress Notes (Signed)
Physical Therapy Treatment Patient Details Name: Trevor Ward MRN: [id number] DOB: 10-24-1940 Today's Date: 02/08/2016    History of Present Illness Pt s/p CABG. PMH - small rt CVA, HTN, PVD, rt CEA    PT Comments    Patient progressing and able to negotiate stairs without much difficulty; but was difficult to motivate to participate due to fatigue this after noon taking three trips before he initiated movement to participate.  Will continue to follow during acute stay. Feel he will need follow up cardiac rehab upon d/c.  Follow Up Recommendations  No PT follow up;Other (comment) (cardiac rehab)     Equipment Recommendations  None recommended by PT    Recommendations for Other Services       Precautions / Restrictions Precautions Precautions: Sternal    Mobility  Bed Mobility Overal bed mobility: Needs Assistance   Rolling: Supervision Sidelying to sit: Mod assist       General bed mobility comments: heavy assist to lift trunk, cues for using legs to assist and sternal precautions; pt SOB upon sitting up, had to sit several moments prior to standing  Transfers Overall transfer level: Needs assistance Equipment used: Rolling walker (2 wheeled) Transfers: Sit to/from Stand Sit to Stand: Min assist         General transfer comment: use of momentum; cues throughout session for precautions  Ambulation/Gait Ambulation/Gait assistance: Min guard;Supervision Ambulation Distance (Feet): 350 Feet Assistive device: Rolling walker (2 wheeled) Gait Pattern/deviations: Step-through pattern;Decreased stride length;Trunk flexed     General Gait Details: cues and facilitation for upright posture and forward gaze   Stairs Stairs: Yes Stairs assistance: Min guard Stair Management: One rail Left Number of Stairs: 5 General stair comments: quickly climbed steps cues to slow down; assist for safety  Wheelchair Mobility    Modified Rankin (Stroke Patients Only)        Balance Overall balance assessment: Needs assistance           Standing balance-Leahy Scale: Good Standing balance comment: able to stand without UE support, but uses walker due to feeling weak/SOB                    Cognition Arousal/Alertness: Awake/alert Behavior During Therapy: WFL for tasks assessed/performed Overall Cognitive Status: Within Functional Limits for tasks assessed                      Exercises      General Comments General comments (skin integrity, edema, etc.): Pre exercise vitals: HR 93; SpO2 96%; BP 131/68; post exercise vitals: HR 103, SpO2 95%; BP 118/74      Pertinent Vitals/Pain Pain Assessment: Faces Faces Pain Scale: Hurts little more Pain Location: chest esp if coughing Pain Descriptors / Indicators: Sore Pain Intervention(s): Monitored during session;Other (comment) (encouraged splinting with heart pillow)    Home Living                      Prior Function            PT Goals (current goals can now be found in the care plan section) Progress towards PT goals: Progressing toward goals    Frequency  Min 3X/week    PT Plan Current plan remains appropriate    Co-evaluation             End of Session Equipment Utilized During Treatment: Gait belt Activity Tolerance: Patient limited by fatigue Patient left: in chair;with call bell/phone  within reach;with family/visitor present     Time: [id number] PT Time Calculation (min) (ACUTE ONLY): 27 min  Charges:  $Gait Training: 23-37 mins                    G Codes:      Reginia Naas 2016-03-02, 5:03 PM  Magda Kiel, [place] Mar 02, 2016

## 2016-02-08 NOTE — Progress Notes (Signed)
CARDIAC REHAB PHASE I   PRE:  Rate/Rhythm: 97 SR    BP: sitting 115/79, 126/79    SaO2: 97 RA  MODE:  Ambulation: 550 ft   POST:  Rate/Rhythm: 108 ST    BP: sitting 124/64     SaO2: 92 RA  Awoke pt and he remained groggy. Needs verbal reminders for how to get up following sternal precautions. Fairly steady on his feet, used RW. Got feet outside RW x1. VSS. To recliner on chair alarm with daughter present. Will f/u. [id number]    [place], ACSM 02/08/2016 10:38 AM

## 2016-02-09 NOTE — Progress Notes (Signed)
Patient Name: Trevor Ward Date of Encounter: 02/09/2016  Principal Problem:   Unstable angina [facility]) Active Problems:   History of CVA (cerebrovascular accident)   Dyslipidemia   Essential hypertension   History of Rt CA endarterectomy   S/P CABG x 4 02/02/16   HOH (hard of hearing)   Primary Cardiologist: Dr. Sallyanne Kuster Patient Profile: Trevor Ward is a 75 year old male with a past medical CVA, CAD s/p urgent right carotid endarterectomy in July 2016,  HTN, and GERD.  Admitted on 02/01/16 for heart cath after complaining of exertional angina and SOB with high suspicion for CAD outpatient. He had severe triple vessel CAD, CABG on 02/02/16.   SUBJECTIVE: Feels tired, says that he is in pain from his CABG. Says he cannot eat because it causes pain at his incision.    OBJECTIVE Filed Vitals:   02/08/16 0953 02/08/16 1302 02/08/16 2119 02/09/16 0457  BP: 115/79 104/65 145/68 137/71  Pulse: 100 83 101 95  Temp:  98.5 F (36.9 C)  98.7 F (37.1 C)  TempSrc:  Oral  Oral  Resp:  18 18 18   Height:      Weight:    195 lb 8 oz (88.678 kg)  SpO2:  94% 95% 94%    Intake/Output Summary (Last 24 hours) at 02/09/16 0808 Last data filed at 02/08/16 1630  Gross per 24 hour  Intake    480 ml  Output    300 ml  Net    180 ml   Filed Weights   02/07/16 0412 02/08/16 0543 02/09/16 0457  Weight: 201 lb 4.5 oz (91.3 kg) 194 lb 9.6 oz (88.27 kg) 195 lb 8 oz (88.678 kg)    PHYSICAL EXAM General: Well developed, well nourished, male in no acute distress. Head: Normocephalic, atraumatic.  Neck: Supple without bruits, No JVD. Lungs:  Resp regular and unlabored, CTA. Heart: RRR, S1, S2, no S3, S4, or murmur; no rub. Abdomen: Soft, non-tender, non-distended, BS + x 4.  Extremities: No clubbing, cyanosis, No edema.  Neuro: Alert and oriented X 3. Moves all extremities spontaneously. Psych: Normal affect.  LABS: CBC: Recent Labs  02/07/16 0340 02/08/16 0405  WBC 7.7 7.6  HGB 11.3*  11.2*  HCT 33.6* 34.2*  MCV 88.0 88.4  PLT 220 [id number]   Basic Metabolic Panel: Recent Labs  02/07/16 0340 02/08/16 0405  NA 136 136  K 3.6 3.7  CL 100* 100*  CO2 28 25  GLUCOSE 140* 105*  BUN 13 15  CREATININE 0.93 0.84  CALCIUM 8.1* 8.2*   Liver Function Tests: Recent Labs  02/07/16 0340  AST 28  ALT 22  ALKPHOS 58  BILITOT 1.1  PROT 5.6*  ALBUMIN 2.3*    Current facility-administered medications:  .  0.9 %  sodium chloride infusion, 250 mL, Intravenous, PRN, Grace Isaac, MD .  acetaminophen (TYLENOL) tablet 650 mg, 650 mg, Oral, Q6H PRN, Grace Isaac, MD, 650 mg at 02/07/16 1151 .  allopurinol (ZYLOPRIM) tablet 100 mg, 100 mg, Oral, BID, Grace Isaac, MD, 100 mg at 02/08/16 2130 .  amitriptyline (ELAVIL) tablet 10 mg, 10 mg, Oral, QHS, Burnell Blanks, MD, 10 mg at 02/08/16 2130 .  aspirin EC tablet 81 mg, 81 mg, Oral, Daily, Grace Isaac, MD, 81 mg at 02/08/16 0953 .  bisacodyl (DULCOLAX) EC tablet 10 mg, 10 mg, Oral, Daily PRN **OR** bisacodyl (DULCOLAX) suppository 10 mg, 10 mg, Rectal, Daily PRN, Grace Isaac,  MD .  docusate sodium (COLACE) capsule 200 mg, 200 mg, Oral, Daily, Grace Isaac, MD, 200 mg at 02/08/16 0953 .  enoxaparin (LOVENOX) injection 40 mg, 40 mg, Subcutaneous, QHS, Grace Isaac, MD, 40 mg at 02/08/16 2130 .  guaiFENesin (MUCINEX) 12 hr tablet 600 mg, 600 mg, Oral, Q12H PRN, Grace Isaac, MD, 600 mg at 02/08/16 2130 .  lactulose (CHRONULAC) 10 GM/15ML solution 20 g, 20 g, Oral, Daily PRN, Erin R Barrett, PA-C .  levalbuterol (XOPENEX) nebulizer solution 0.63 mg, 0.63 mg, Inhalation, Q6H PRN, Grace Isaac, MD .  losartan (COZAAR) tablet 50 mg, 50 mg, Oral, Daily, Keyanna Sandefer, MD .  metoprolol tartrate (LOPRESSOR) tablet 25 mg, 25 mg, Oral, BID, Erin R Barrett, PA-C, 25 mg at 02/08/16 2130 .  ondansetron (ZOFRAN) tablet 4 mg, 4 mg, Oral, Q6H PRN, 4 mg at 02/05/16 2109 **OR** ondansetron (ZOFRAN)  injection 4 mg, 4 mg, Intravenous, Q6H PRN, Grace Isaac, MD .  oxyCODONE (Oxy IR/ROXICODONE) immediate release tablet 5 mg, 5 mg, Oral, Q3H PRN, Grace Isaac, MD, 5 mg at 02/08/16 1703 .  pantoprazole (PROTONIX) EC tablet 40 mg, 40 mg, Oral, QAC breakfast, Grace Isaac, MD, 40 mg at 02/09/16 0606 .  rosuvastatin (CRESTOR) tablet 5 mg, 5 mg, Oral, Daily, Grace Isaac, MD, 5 mg at 02/08/16 0953 .  sodium chloride flush (NS) 0.9 % injection 3 mL, 3 mL, Intravenous, Q12H, Grace Isaac, MD, 3 mL at 02/08/16 2130 .  sodium chloride flush (NS) 0.9 % injection 3 mL, 3 mL, Intravenous, PRN, Grace Isaac, MD .  traMADol Veatrice Bourbon) tablet 50 mg, 50 mg, Oral, Q4H PRN, Grace Isaac, MD, 50 mg at 02/08/16 2130 .  vitamin B-12 (CYANOCOBALAMIN) tablet 500 mcg, 500 mcg, Oral, Daily, Grace Isaac, MD, 500 mcg at 02/08/16 0953   TELE: Sinus tach  Rate 100's.        ECG: Sinus tach     Current Medications:  . allopurinol  100 mg Oral BID  . amitriptyline  10 mg Oral QHS  . aspirin EC  81 mg Oral Daily  . docusate sodium  200 mg Oral Daily  . enoxaparin (LOVENOX) injection  40 mg Subcutaneous QHS  . losartan  50 mg Oral Daily  . metoprolol tartrate  25 mg Oral BID  . pantoprazole  40 mg Oral QAC breakfast  . rosuvastatin  5 mg Oral Daily  . sodium chloride flush  3 mL Intravenous Q12H  . vitamin B-12  500 mcg Oral Daily      ASSESSMENT AND PLAN: Principal Problem:   Unstable angina (HCC) Active Problems:   History of CVA (cerebrovascular accident)   Dyslipidemia   Essential hypertension   History of Rt CA endarterectomy   S/P CABG x 4 02/02/16   HOH (hard of hearing)  1. S/p CABG x 4 02/02/16: Has been slow to progress his activity post op. He is sitting up in chair today, says he climbed stairs yesterday. Management per CVTS.   2. HTN: Well controlled, ACEI changed to ARB yesterday as patient said that lisinopril made him nauseous. He has not gotten a dose  of losartan yet. He is on beta blocker, still has resting HR of 90-100. Can increase.  Creatinine stable.   3. Dyslipidemia: On Crestor.    Signed, Arbutus Leas , NP 8:08 AM 02/09/2016 Pager [phone]  I have seen and examined the patient along with Arbutus Leas ,  NP.  I have reviewed the chart, notes and new data.  I agree with PA's note.  PLAN: Progressing, but slowly. Seems to be better with losartan rather than lisinopril. He seems to be back to preop weight/fluid status.  Sanda Klein, MD, [place] [phone] 02/09/2016, 1:20 PM

## 2016-02-09 NOTE — Discharge Instructions (Signed)
Coronary Artery Bypass Grafting, Care After °Refer to this sheet in the next few weeks. These instructions provide you with information on caring for yourself after your procedure. Your health care provider may also give you more specific instructions. Your treatment has been planned according to current medical practices, but problems sometimes occur. Call your health care provider if you have any problems or questions after your procedure. °WHAT TO EXPECT AFTER THE PROCEDURE °Recovery from surgery will be different for everyone. Some people feel well after 3 or 4 weeks, while for others it takes longer. After your procedure, it is typical to have the following: °· Nausea and a lack of appetite.   °· Constipation. °· Weakness and fatigue.   °· Depression or irritability.   °· Pain or discomfort at your incision site. °HOME CARE INSTRUCTIONS °· Take medicines only as directed by your health care provider. Do not stop taking medicines or start any new medicines without first checking with your health care provider. °· Take your pulse as directed by your health care provider. °· Perform deep breathing as directed by your health care provider. If you were given a device called an incentive spirometer, use it to practice deep breathing several times a day. Support your chest with a pillow or your arms when you take deep breaths or cough. °· Keep incision areas clean, dry, and protected. Remove or change any bandages (dressings) only as directed by your health care provider. You may have skin adhesive strips over the incision areas. Do not take the strips off. They will fall off on their own. °· Check incision areas daily for any swelling, redness, or drainage. °· If incisions were made in your legs, do the following: °¨ Avoid crossing your legs.   °¨ Avoid sitting for long periods of time. Change positions every 30 minutes.   °¨ Elevate your legs when you are sitting. °· Wear compression stockings as directed by your  health care provider. These stockings help keep blood clots from forming in your legs. °· Take showers once your health care provider approves. Until then, only take sponge baths. Pat incisions dry. Do not rub incisions with a washcloth or towel. Do not take baths, swim, or use a hot tub until your health care provider approves. °· Eat foods that are high in fiber, such as raw fruits and vegetables, whole grains, beans, and nuts. Meats should be lean cut. Avoid canned, processed, and fried foods. °· Drink enough fluid to keep your urine clear or pale yellow. °· Weigh yourself every day. This helps identify if you are retaining fluid that may make your heart and lungs work harder. °· Rest and limit activity as directed by your health care provider. You may be instructed to: °¨ Stop any activity at once if you have chest pain, shortness of breath, irregular heartbeats, or dizziness. Get help right away if you have any of these symptoms. °¨ Move around frequently for short periods or take short walks as directed by your health care provider. Increase your activities gradually. You may need physical therapy or cardiac rehabilitation to help strengthen your muscles and build your endurance. °¨ Avoid lifting, pushing, or pulling anything heavier than 10 lb (4.5 kg) for at least 6 weeks after surgery. °· Do not drive until your health care provider approves.  °· Ask your health care provider when you may return to work. °· Ask your health care provider when you may resume sexual activity. °· Keep all follow-up visits as directed by your health care   provider. This is important. °SEEK MEDICAL CARE IF: °· You have swelling, redness, increasing pain, or drainage at the site of an incision. °· You have a fever. °· You have swelling in your ankles or legs. °· You have pain in your legs.   °· You gain 2 or more pounds (0.9 kg) a day. °· You are nauseous or vomit. °· You have diarrhea.  °SEEK IMMEDIATE MEDICAL CARE IF: °· You have  chest pain that goes to your jaw or arms. °· You have shortness of breath.   °· You have a fast or irregular heartbeat.   °· You notice a "clicking" in your breastbone (sternum) when you move.   °· You have numbness or weakness in your arms or legs. °· You feel dizzy or light-headed.   °MAKE SURE YOU: °· Understand these instructions. °· Will watch your condition. °· Will get help right away if you are not doing well or get worse. °  °This information is not intended to replace advice given to you by your health care provider. Make sure you discuss any questions you have with your health care provider. °  °Document Released: 03/31/2005 Document Revised: 10/02/2014 Document Reviewed: 02/18/2013 °Elsevier Interactive Patient Education ©2016 [employer] ° °Endoscopic Saphenous Vein Harvesting, Care After °Refer to this sheet in the next few weeks. These instructions provide you with information on caring for yourself after your procedure. Your health care provider may also give you more specific instructions. Your treatment has been planned according to current medical practices, but problems sometimes occur. Call your health care provider if you have any problems or questions after your procedure. °HOME CARE INSTRUCTIONS °Medicine °· Take whatever pain medicine your surgeon prescribes. Follow the directions carefully. Do not take over-the-counter pain medicine unless your surgeon says it is okay. Some pain medicine can cause bleeding problems for several weeks after surgery. °· Follow your surgeon's instructions about driving. You will probably not be permitted to drive after heart surgery. °· Take any medicines your surgeon prescribes. Any medicines you took before your heart surgery should be checked with your health care provider before you start taking them again. °Wound care °· If your surgeon has prescribed an elastic bandage or stocking, ask how long you should wear it. °· Check the area around your surgical  cuts (incisions) whenever your bandages (dressings) are changed. Look for any redness or swelling. °· You will need to return to have the stitches (sutures) or staples taken out. Ask your surgeon when to do that. °· Ask your surgeon when you can shower or bathe. °Activity °· Try to keep your legs raised when you are sitting. °· Do any exercises your health care providers have given you. These may include deep breathing exercises, coughing, walking, or other exercises. °SEEK MEDICAL CARE IF: °· You have any questions about your medicines. °· You have more leg pain, especially if your pain medicine stops working. °· New or growing bruises develop on your leg. °· Your leg swells, feels tight, or becomes red. °· You have numbness in your leg. °SEEK IMMEDIATE MEDICAL CARE IF: °· Your pain gets much worse. °· Blood or fluid leaks from any of the incisions. °· Your incisions become warm, swollen, or red. °· You have chest pain. °· You have trouble breathing. °· You have a fever. °· You have more pain near your leg incision. °MAKE SURE YOU: °· Understand these instructions. °· Will watch your condition. °· Will get help right away if you are not doing well or   get worse. °  °This information is not intended to replace advice given to you by your health care provider. Make sure you discuss any questions you have with your health care provider. °  °Document Released: 05/24/2011 Document Revised: 10/02/2014 Document Reviewed: 05/24/2011 °Elsevier Interactive Patient Education ©2016 [employer] ° ° °

## 2016-02-09 NOTE — Progress Notes (Signed)
CARDIAC REHAB PHASE I   PRE:  Rate/Rhythm: 101 ST  BP:  Supine:   Sitting: 118/67  Standing:    SaO2: 96%RA  MODE:  Ambulation: 550 ft   POST:  Rate/Rhythm: 113 ST  BP:  Supine: 136/75  Sitting:   Standing:    SaO2: 96%RA [id number] Pt walked 550 ft on RA with gait belt use, rolling walker and asst x 1. Pt tolerated well but not feeling well today. Nauseated and not being able to eat. To bathroom and then to bed.  Reminded sternal precautions.   Graylon Good, RN BSN  02/09/2016 9:09 AM

## 2016-02-09 NOTE — Discharge Summary (Signed)
Physician Discharge Summary  Patient ID: Trevor Ward MRN: [id number] DOB/AGE: 1941-01-20 75 y.o.  Admit date: 02/01/2016 Discharge date: 02/10/2016  Admission Diagnoses:  Patient Active Problem List   Diagnosis Date Noted  . HOH (hard of hearing) 02/08/2016  . Unstable angina ([place])   . Exertional dyspnea 01/28/2016  . Aortic atherosclerosis ([place]) 01/28/2016  . Cerebrovascular accident (CVA) due to embolism of right middle cerebral artery ([place]) 07/26/2015  . Essential hypertension 07/26/2015  . History of Rt CA endarterectomy 07/26/2015  . Dyslipidemia   . Gout 04/11/2015  . History of CVA (cerebrovascular accident) 04/11/2015   Discharge Diagnoses:   Patient Active Problem List   Diagnosis Date Noted  . HOH (hard of hearing) 02/08/2016  . S/P CABG x 4 02/02/16 02/02/2016  . Unstable angina ([place])   . Exertional dyspnea 01/28/2016  . Aortic atherosclerosis ([place]) 01/28/2016  . Cerebrovascular accident (CVA) due to embolism of right middle cerebral artery ([place]) 07/26/2015  . Essential hypertension 07/26/2015  . History of Rt CA endarterectomy 07/26/2015  . Dyslipidemia   . Gout 04/11/2015  . History of CVA (cerebrovascular accident) 04/11/2015   Discharged Condition: good  History of Present Illness:  Trevor Ward is a 75 yo white male with history of Carotid Endarterectomy. He has had several weeks of shortness of breath and what he describes as indigestion. He denies chest pain and states he has never had chest pain. He underwent complete GI workup which was negative. Due to this the patient was referred for Cardiology evaluation. He was seen by Dr. Sallyanne Kuster who felt he was most likely experiencing angina. He offered the patient a stress test, but the patient ultimately felt that was an un-neccessary step and wished to proceed with cardiac catheterization. This was done on 02/01/2016 and showed severe 3 vessel CAD. It was felt coronary bypass grafting would be indicated  and TCTS consult was obtained.   Hospital Course:   He was evaluated by Dr. Servando Snare who was in agreement the patient would benefit from Coronary bypass procedure.  The risks and benefits of the procedure were explained to the patient and he was agreeable to proceed.  He was taken to the operating room on 02/02/2016.  He underwent CABG x 4 utilizing LIMA to LAD, Sequential SVG to Ramus Intermediate 1 and 2, and SVG to PDA.  He also underwent endoscopic harvest of the greater saphenous vein from his left leg.  He tolerated the procedure without difficulty and was taken to the SICU in stable condition.  He was extubated the evening of surgery.  During his stay in the SICU the patient was weaned off Neo Synephrine drip as tolerated.  His chest tubes and arterial lines were removed without difficulty.  He was maintaining NSR and felt stable for transfer to the step down unit on POD #2.  The patient has made slow progress.  His urinary output was very low due to poor oral intake.  He developed nausea and vomiting and was started on fluids to account for volume loss and poor oral intake.  He had an abdominal film done which did not show evidence of Ileus.  After administration of fluids his urinary output improved.  He continued to felt better and was able to tolerate oral intake.  He has a chronic history of nausea associated with eating solids.  He states this has been present since his Carotid Endarterectomy.  He has been treated with anti-emetics and was encouraged to eat as tolerated.  He developed sternal drainage from the inferior portion of his sternotomy.  Culture has been obtained and has been negative to date.  The drainage has improved and there is no gross infection present.  He has been maintaining NSR and his pacing wires have been removed without difficulty.  He has been evaluated by PT who felt patient was safe for discharge home.  He is medically stable at this time.  He continues to maintain NSR.   He is ambulating with assistance.  He is felt medically stable for discharge home today.           Significant Diagnostic Studies: angiography:    Lat Ramus lesion, 40% stenosed.  Ramus lesion, 90% stenosed.  Ost LAD to Mid LAD lesion, 80% stenosed.  Ost 1st Diag to 1st Diag lesion, 90% stenosed.  Dist LAD lesion, 80% stenosed.  Ost LM to LM lesion, 50% stenosed.  Mid RCA to Dist RCA lesion, 100% stenosed.  Ost RCA to Mid RCA lesion, 80% stenosed.  Prox Cx lesion, 30% stenosed.  The left ventricular systolic function is normal.  1. Severe triple vessel CAD 2. Normal LV systolic function 3. Unstable angina  Treatments: surgery:   Coronary artery bypass grafting x4 with left internal mammary to left anterior descending coronary artery, sequential reverse saphenous vein graft to the first and second intermediate reverse saphenous vein graft to the posterior descending with the left greater saphenous vein endoscopic harvesting left thigh.  Disposition: 01-Home or Self Care   Discharge medications:  The patient has been discharged on:   1.Beta Blocker:  Yes [ x  ]                              No   [   ]                              If No, reason:  2.Ace Inhibitor/ARB: Yes [ x ]                                     No  [    ]                                     If No, reason:  3.Statin:   Yes [  x ]                  No  [   ]                  If No, reason:  4.Ecasa:  Yes  [x   ]                  No   [   ]                  If No, reason:      Medication List    STOP taking these medications        aspirin 81 MG tablet  Replaced by:  aspirin 81 MG EC tablet     lisinopril 10 MG tablet  Commonly known as:  PRINIVIL,ZESTRIL      TAKE these medications  allopurinol 100 MG tablet  Commonly known as:  ZYLOPRIM  Take 100 mg by mouth 2 (two) times daily.     amitriptyline 10 MG tablet  Commonly known as:  ELAVIL  Take 10 mg by mouth at bedtime.      aspirin 81 MG EC tablet  Take 1 tablet (81 mg total) by mouth daily.     esomeprazole 40 MG capsule  Commonly known as:  NEXIUM  Take 40 mg by mouth 2 (two) times daily before a meal.     Fish Oil 1200 MG Caps  Take 1,200 mg by mouth daily.     GLUCOSAMINE RELIEF 1000 MG Tabs  Generic drug:  Glucosamine Sulfate  Take 2,000 mg by mouth daily.     guaiFENesin 600 MG 12 hr tablet  Commonly known as:  MUCINEX  Take 1 tablet (600 mg total) by mouth every 12 (twelve) hours as needed for cough.     losartan 50 MG tablet  Commonly known as:  COZAAR  Take 1 tablet (50 mg total) by mouth daily.     metoprolol tartrate 25 MG tablet  Commonly known as:  LOPRESSOR  Take 1 tablet (25 mg total) by mouth 2 (two) times daily.     rosuvastatin 5 MG tablet  Commonly known as:  CRESTOR  Take 1 tablet (5 mg total) by mouth daily.     traMADol 50 MG tablet  Commonly known as:  ULTRAM  Take 1 tablet (50 mg total) by mouth every 4 (four) hours as needed for moderate pain.     vitamin B-12 500 MCG tablet  Commonly known as:  CYANOCOBALAMIN  Take 500 mcg by mouth daily.       Follow-up Information    Follow up with Kerin Ransom, PA-C On 02/28/2016.   Specialties:  Cardiology, Radiology   Why:  10 am   Contact information:   [address] [place] [address] [phone]       Follow up with Grace Isaac, MD On 03/16/2016.   Specialty:  Cardiothoracic Surgery   Why:  Appointment is at 12:00, please get CXR at [facility] imaging at 11:30 which is located on the first floor of [facility] information:   [place] [address] [phone]       Signed: Ellwood Handler 02/10/2016, 8:49 AM

## 2016-02-09 NOTE — Progress Notes (Addendum)
      [address]       [place] [address]             [phone]      7 Days Post-Op Procedure(s) (LRB): CORONARY ARTERY BYPASS GRAFTING (CABG) times 4 using left internal mammary and left saphenous ven harvested by endoven  (N/A) TRANSESOPHAGEAL ECHOCARDIOGRAM (TEE) (N/A)   Subjective:  Mr. Householder continues to complain of not wanting to eat.  He states he tries but it just makes him sick.  He states this has been a problem since his Carotid Endarterctomy surgery.  Encouraged patient to eat what he can tolerate.  Encouraged family to bring him something from home that sounds good to him  Objective: Vital signs in last 24 hours: Temp:  [98.5 F (36.9 C)-98.7 F (37.1 C)] 98.7 F (37.1 C) (05/17 0457) Pulse Rate:  [83-101] 95 (05/17 0457) Cardiac Rhythm:  [-] Normal sinus rhythm (05/16 1900) Resp:  [18] 18 (05/17 0457) BP: (104-145)/(65-79) 137/71 mmHg (05/17 0457) SpO2:  [94 %-95 %] 94 % (05/17 0457) Weight:  [195 lb 8 oz (88.678 kg)] 195 lb 8 oz (88.678 kg) (05/17 0457)  Intake/Output from previous day: 05/16 0701 - 05/17 0700 In: 720 [P.O.:720] Out: 700 [Urine:700] Intake/Output this shift: Total I/O In: 240 [P.O.:240] Out: -   General appearance: alert, cooperative and no distress Heart: regular rate and rhythm Lungs: clear to auscultation bilaterally Abdomen: soft, non-tender; bowel sounds normal; no masses,  no organomegaly Extremities: edema trace Wound: clean and dry  Lab Results:  Recent Labs  02/07/16 0340 02/08/16 0405  WBC 7.7 7.6  HGB 11.3* 11.2*  HCT 33.6* 34.2*  PLT 220 275   BMET:  Recent Labs  02/07/16 0340 02/08/16 0405  NA 136 136  K 3.6 3.7  CL 100* 100*  CO2 28 25  GLUCOSE 140* 105*  BUN 13 15  CREATININE 0.93 0.84  CALCIUM 8.1* 8.2*    PT/INR: No results for input(s): LABPROT, INR in the last 72 hours. ABG    Component Value Date/Time   PHART 7.324* 02/02/2016 2046   HCO3 22.1 02/02/2016 2046   TCO2 23  02/03/2016 1650   ACIDBASEDEF 4.0* 02/02/2016 2046   O2SAT 91.0 02/02/2016 2046   CBG (last 3)  No results for input(s): GLUCAP in the last 72 hours.  Assessment/Plan: S/P Procedure(s) (LRB): CORONARY ARTERY BYPASS GRAFTING (CABG) times 4 using left internal mammary and left saphenous ven harvested by endoven  (N/A) TRANSESOPHAGEAL ECHOCARDIOGRAM (TEE) (N/A)  1. CV- hemodynamically stable on Lopressor, Cozaar 2. Pulm- no acute issues, off oxygen 3. Renal- stable, not hypervolemic no indication for Lasix at this time 4. GI- persistent nausea, continue zofran... Seems like chronic problem for patient 5. DIspo- patient stable, no H/H needs per PT, continue current care   LOS: 8 days    BARRETT, ERIN 02/09/2016  I have seen and examined Shanna Cisco and agree with the above assessment  and plan.  Grace Isaac MD Beeper [phone] Office [phone] 02/09/2016 2:00 PM

## 2016-02-10 LAB — WOUND CULTURE
Culture: NO GROWTH
Gram Stain: NONE SEEN
Special Requests: NORMAL

## 2016-02-10 MED ORDER — TRAMADOL HCL 50 MG PO TABS: 50.0000 mg | ORAL_TABLET | ORAL | Status: DC | PRN

## 2016-02-10 MED ORDER — GUAIFENESIN ER 600 MG PO TB12: 600.0000 mg | ORAL_TABLET | Freq: Two times a day (BID) | ORAL | Status: DC | PRN

## 2016-02-10 MED ORDER — METOPROLOL TARTRATE 25 MG PO TABS: 25.0000 mg | ORAL_TABLET | Freq: Two times a day (BID) | ORAL | Status: DC

## 2016-02-10 MED ORDER — ASPIRIN 81 MG PO TBEC: 81.0000 mg | DELAYED_RELEASE_TABLET | Freq: Every day | ORAL | Status: DC

## 2016-02-10 MED ORDER — ROSUVASTATIN CALCIUM 5 MG PO TABS: 5.0000 mg | ORAL_TABLET | Freq: Every day | ORAL | Status: DC

## 2016-02-10 MED ORDER — LOSARTAN POTASSIUM 50 MG PO TABS: 50.0000 mg | ORAL_TABLET | Freq: Every day | ORAL | Status: DC

## 2016-02-10 NOTE — Progress Notes (Addendum)
      [address]       [place] [address]             [phone]      8 Days Post-Op Procedure(s) (LRB): CORONARY ARTERY BYPASS GRAFTING (CABG) times 4 using left internal mammary and left saphenous ven harvested by endoven  (N/A) TRANSESOPHAGEAL ECHOCARDIOGRAM (TEE) (N/A)   Subjective:  Trevor Ward has no new complaints.  He continues to not eat much stating the smell and look of the food are not appealing to him.  He is tolerating liquids.  He has moved his bowels.  Wants to go home.  Objective: Vital signs in last 24 hours: Temp:  [98.2 F (36.8 C)-98.7 F (37.1 C)] 98.2 F (36.8 C) (05/18 0621) Pulse Rate:  [92-99] 92 (05/18 0621) Cardiac Rhythm:  [-] Normal sinus rhythm (05/18 0700) Resp:  [18] 18 (05/18 0621) BP: (119-135)/(64-67) 125/66 mmHg (05/18 0621) SpO2:  [93 %-94 %] 93 % (05/18 0621) Weight:  [194 lb 4.8 oz (88.134 kg)] 194 lb 4.8 oz (88.134 kg) (05/18 0621)  Intake/Output from previous day: 05/17 0701 - 05/18 0700 In: 240 [P.O.:240] Out: 400 [Urine:400]  General appearance: alert, cooperative and no distress Heart: regular rate and rhythm Lungs: clear to auscultation bilaterally Abdomen: soft, non-tender; bowel sounds normal; no masses,  no organomegaly Extremities: edema none Wound: clean and dry  Lab Results:  Recent Labs  02/08/16 0405  WBC 7.6  HGB 11.2*  HCT 34.2*  PLT 275   BMET:  Recent Labs  02/08/16 0405  NA 136  K 3.7  CL 100*  CO2 25  GLUCOSE 105*  BUN 15  CREATININE 0.84  CALCIUM 8.2*    PT/INR: No results for input(s): LABPROT, INR in the last 72 hours. ABG    Component Value Date/Time   PHART 7.324* 02/02/2016 2046   HCO3 22.1 02/02/2016 2046   TCO2 23 02/03/2016 1650   ACIDBASEDEF 4.0* 02/02/2016 2046   O2SAT 91.0 02/02/2016 2046   CBG (last 3)  No results for input(s): GLUCAP in the last 72 hours.  Assessment/Plan: S/P Procedure(s) (LRB): CORONARY ARTERY BYPASS GRAFTING (CABG) times 4  using left internal mammary and left saphenous ven harvested by endoven  (N/A) TRANSESOPHAGEAL ECHOCARDIOGRAM (TEE) (N/A)  1. CV- remains hemodynamically stable, continue Lopressor and Cozaar 2. Pulm- no acute issue, off oxygen continue IS at discharge 3. Renal- stable, no edema present, no indication for Lasix at this time 4. GI- bowels have moved, food continues to not be appealing to him, tolerating liquid intake... This has been chronic since his CEA several months ago... Likely exacerbated with recent surgery 5. Dispo- patient stable, will d/c patient home today if ok with Dr. Servando Snare   LOS: 9 days    Ellwood Handler 02/10/2016  Plan d/c today I have seen and examined Trevor Ward and agree with the above assessment  and plan.  Grace Isaac MD Beeper [phone] Office [phone] 02/10/2016 9:17 AM

## 2016-02-10 NOTE — Progress Notes (Signed)
Physical Therapy Treatment Patient Details Name: Trevor Ward MRN: [id number] DOB: 04-22-1941 Today's Date: 16-Feb-2016    History of Present Illness Pt s/p CABG. PMH - small rt CVA, HTN, PVD, rt CEA    PT Comments    Pt making good progress. Ready for dc home with family.  Follow Up Recommendations  No PT follow up     Equipment Recommendations  None recommended by PT    Recommendations for Other Services       Precautions / Restrictions Precautions Precautions: None    Mobility  Bed Mobility Overal bed mobility: Needs Assistance Bed Mobility: Sidelying to Sit;Rolling Rolling: Min guard Sidelying to sit: Min assist       General bed mobility comments: Assist to elevate trunk due to sternal precautions  Transfers Overall transfer level: Needs assistance Equipment used: 4-wheeled walker Transfers: Sit to/from Stand Sit to Stand: Supervision         General transfer comment: Verbal cues for hand placement to follow sternal precautions.   Ambulation/Gait Ambulation/Gait assistance: Modified independent (Device/Increase time) Ambulation Distance (Feet): 350 Feet Assistive device: 4-wheeled walker;None Gait Pattern/deviations: Step-through pattern;Decreased stride length   Gait velocity interpretation: at or above normal speed for age/gender General Gait Details: Pt with steady gait with rollator.    Stairs            Wheelchair Mobility    Modified Rankin (Stroke Patients Only)       Balance   Sitting-balance support: No upper extremity supported Sitting balance-Leahy Scale: Normal     Standing balance support: No upper extremity supported Standing balance-Leahy Scale: Good                      Cognition Arousal/Alertness: Awake/alert Behavior During Therapy: WFL for tasks assessed/performed Overall Cognitive Status: Within Functional Limits for tasks assessed                 General Comments: Has gotten up on his  own but think this is due to his baseline independent nature    Exercises      General Comments        Pertinent Vitals/Pain Pain Assessment: No/denies pain    Home Living                      Prior Function            PT Goals (current goals can now be found in the care plan section) Acute Rehab PT Goals Patient Stated Goal: return home Progress towards PT goals: Progressing toward goals    Frequency  Min 3X/week    PT Plan Current plan remains appropriate    Co-evaluation             End of Session   Activity Tolerance: Patient tolerated treatment well Patient left: with call bell/phone within reach;in chair     Time: 1015-1035 PT Time Calculation (min) (ACUTE ONLY): 20 min  Charges:  $Gait Training: 8-22 mins                    G Codes:      Isay Perleberg 02/16/16, 12:06 PM [employer] PT [phone]

## 2016-02-10 NOTE — Care Management Note (Signed)
Case Management Note Marvetta Gibbons RN, BSN Unit 2W-Case Manager [phone]  Patient Details  Name: Trevor Ward MRN: [id number] Date of Birth: Feb 10, 1941  Subjective/Objective:    Pt admitted with c/p found MVD- s/p CABG                Action/Plan: PTA pt lived at home - plan to return home- no CM needs noted.   Expected Discharge Date:    02/09/17              Expected Discharge Plan:  Home/Self Care  In-House Referral:     Discharge planning Services  CM Consult  Post Acute Care Choice:    Choice offered to:     DME Arranged:    DME Agency:     HH Arranged:    HH Agency:     Status of Service:  Completed, signed off  Medicare Important Message Given:  Yes Date Medicare IM Given:    Medicare IM give by:    Date Additional Medicare IM Given:    Additional Medicare Important Message give by:     If discussed at [place] of Stay Meetings, dates discussed:    Additional Comments:  Dawayne Patricia, RN 02/10/2016, 11:21 AM

## 2016-02-10 NOTE — Progress Notes (Signed)
Patient Name: Trevor Ward Date of Encounter: 02/10/2016  Principal Problem:   Unstable angina [facility]) Active Problems:   History of CVA (cerebrovascular accident)   Dyslipidemia   Essential hypertension   History of Rt CA endarterectomy   S/P CABG x 4 02/02/16   HOH (hard of hearing)   Primary Cardiologist: Dr. Sallyanne Kuster Patient Profile: Trevor Ward is a 75 year old male with a past medical CVA, CAD s/p urgent right carotid endarterectomy in July 2016, HTN, and GERD. Admitted on 02/01/16 for heart cath after complaining of exertional angina and SOB with high suspicion for CAD outpatient. He had severe triple vessel CAD, CABG on 02/02/16.   SUBJECTIVE: Feels well this am. Eating grits, says he feels like he can eat small amounts.    OBJECTIVE Filed Vitals:   02/09/16 0457 02/09/16 1400 02/09/16 2119 02/10/16 0621  BP: 137/71 135/67 119/64 125/66  Pulse: 95 99 96 92  Temp: 98.7 F (37.1 C) 98.7 F (37.1 C) 98.3 F (36.8 C) 98.2 F (36.8 C)  TempSrc: Oral Oral Oral Oral  Resp: 18 18 18 18   Height:      Weight: 195 lb 8 oz (88.678 kg)   194 lb 4.8 oz (88.134 kg)  SpO2: 94% 94% 93% 93%    Intake/Output Summary (Last 24 hours) at 02/10/16 0808 Last data filed at 02/09/16 2030  Gross per 24 hour  Intake    240 ml  Output    400 ml  Net   -160 ml   Filed Weights   02/08/16 0543 02/09/16 0457 02/10/16 0621  Weight: 194 lb 9.6 oz (88.27 kg) 195 lb 8 oz (88.678 kg) 194 lb 4.8 oz (88.134 kg)    PHYSICAL EXAM General: Well developed, well nourished, male in no acute distress. Head: Normocephalic, atraumatic.  Neck: Supple without bruits, No JVD. Lungs:  Resp regular and unlabored, CTA. Heart: RRR, S1, S2, no S3, S4, or murmur; no rub. Abdomen: Soft, non-tender, non-distended, BS + x 4.  Extremities: No clubbing, cyanosis, No edema.  Neuro: Alert and oriented X 3. Moves all extremities spontaneously. Psych: Normal affect.  LABS: CBC: Recent Labs  02/08/16 0405    WBC 7.6  HGB 11.2*  HCT 34.2*  MCV 88.4  PLT [id number]   Basic Metabolic Panel: Recent Labs  02/08/16 0405  NA 136  K 3.7  CL 100*  CO2 25  GLUCOSE 105*  BUN 15  CREATININE 0.84  CALCIUM 8.2*     Current facility-administered medications:  .  0.9 %  sodium chloride infusion, 250 mL, Intravenous, PRN, Grace Isaac, MD .  acetaminophen (TYLENOL) tablet 650 mg, 650 mg, Oral, Q6H PRN, Grace Isaac, MD, 650 mg at 02/07/16 1151 .  allopurinol (ZYLOPRIM) tablet 100 mg, 100 mg, Oral, BID, Grace Isaac, MD, 100 mg at 02/09/16 2121 .  amitriptyline (ELAVIL) tablet 10 mg, 10 mg, Oral, QHS, Burnell Blanks, MD, 10 mg at 02/09/16 2120 .  aspirin EC tablet 81 mg, 81 mg, Oral, Daily, Grace Isaac, MD, 81 mg at 02/09/16 1108 .  bisacodyl (DULCOLAX) EC tablet 10 mg, 10 mg, Oral, Daily PRN **OR** bisacodyl (DULCOLAX) suppository 10 mg, 10 mg, Rectal, Daily PRN, Grace Isaac, MD .  docusate sodium (COLACE) capsule 200 mg, 200 mg, Oral, Daily, Grace Isaac, MD, 200 mg at 02/08/16 0953 .  enoxaparin (LOVENOX) injection 40 mg, 40 mg, Subcutaneous, QHS, Grace Isaac, MD, 40 mg at 02/09/16 2121 .  guaiFENesin (MUCINEX) 12 hr tablet 600 mg, 600 mg, Oral, Q12H PRN, Grace Isaac, MD, 600 mg at 02/09/16 2120 .  lactulose (CHRONULAC) 10 GM/15ML solution 20 g, 20 g, Oral, Daily PRN, Erin R Barrett, PA-C .  levalbuterol (XOPENEX) nebulizer solution 0.63 mg, 0.63 mg, Inhalation, Q6H PRN, Grace Isaac, MD .  losartan (COZAAR) tablet 50 mg, 50 mg, Oral, Daily, Kariyah Baugh, MD, 50 mg at 02/09/16 1108 .  metoprolol tartrate (LOPRESSOR) tablet 25 mg, 25 mg, Oral, BID, Erin R Barrett, PA-C, 25 mg at 02/09/16 2121 .  ondansetron (ZOFRAN) tablet 4 mg, 4 mg, Oral, Q6H PRN, 4 mg at 02/10/16 0528 **OR** ondansetron (ZOFRAN) injection 4 mg, 4 mg, Intravenous, Q6H PRN, Grace Isaac, MD, 4 mg at 02/09/16 1509 .  oxyCODONE (Oxy IR/ROXICODONE) immediate release tablet 5  mg, 5 mg, Oral, Q3H PRN, Grace Isaac, MD, 5 mg at 02/09/16 1508 .  pantoprazole (PROTONIX) EC tablet 40 mg, 40 mg, Oral, QAC breakfast, Grace Isaac, MD, 40 mg at 02/10/16 0528 .  rosuvastatin (CRESTOR) tablet 5 mg, 5 mg, Oral, Daily, Grace Isaac, MD, 5 mg at 02/09/16 1109 .  sodium chloride flush (NS) 0.9 % injection 3 mL, 3 mL, Intravenous, Q12H, Grace Isaac, MD, 3 mL at 02/09/16 2124 .  sodium chloride flush (NS) 0.9 % injection 3 mL, 3 mL, Intravenous, PRN, Grace Isaac, MD .  traMADol Veatrice Bourbon) tablet 50 mg, 50 mg, Oral, Q4H PRN, Grace Isaac, MD, 50 mg at 02/10/16 0529 .  vitamin B-12 (CYANOCOBALAMIN) tablet 500 mcg, 500 mcg, Oral, Daily, Grace Isaac, MD, 500 mcg at 02/09/16 1109    TELE: NSR       ECG: NSR    Current Medications:  . allopurinol  100 mg Oral BID  . amitriptyline  10 mg Oral QHS  . aspirin EC  81 mg Oral Daily  . docusate sodium  200 mg Oral Daily  . enoxaparin (LOVENOX) injection  40 mg Subcutaneous QHS  . losartan  50 mg Oral Daily  . metoprolol tartrate  25 mg Oral BID  . pantoprazole  40 mg Oral QAC breakfast  . rosuvastatin  5 mg Oral Daily  . sodium chloride flush  3 mL Intravenous Q12H  . vitamin B-12  500 mcg Oral Daily      ASSESSMENT AND PLAN: Principal Problem:   Unstable angina (HCC) Active Problems:   History of CVA (cerebrovascular accident)   Dyslipidemia   Essential hypertension   History of Rt CA endarterectomy   S/P CABG x 4 02/02/16   HOH (hard of hearing)  1. S/p CABG x 4 02/02/16: Has been slow to progress his activity post op. He is sitting up in chair today, says he climbed stairs yesterday. Management per CVTS.   2. HTN: Well controlled,pt on ARB and beta blocker. Creatinine within normal range.   3. Dyslipidemia: On Crestor, continue same.    Signed, Arbutus Leas , NP 8:08 AM 02/10/2016 Pager [phone]  I have seen and examined the patient along with Arbutus Leas , NP.  I have  reviewed the chart, notes and new data.  I agree with NP's note.  PLAN: Continues slow, but positive path to recovery. DC today. Will arrange f/u  Sanda Klein, MD, [place] [phone] 02/10/2016, 10:41 AM

## 2016-02-10 NOTE — Progress Notes (Signed)
Awaiting discharge via wheelchair. Reviewed discharge instructions and removed IV. No issues at present.   Rexanna Louthan, Mervin Kung RN

## 2016-02-10 NOTE — Progress Notes (Signed)
1128-1202 Education completed with pt and wife who voiced understanding. Discussed sternal precautions, IS and ex ed. Has rolling walker at home for use. Discussed CRP 2 and will refer to [facility] program. Did not want to watch discharge video. Pt has A1C of 6.4. Discussed watching foods that are very starchy and cutting back on candy as wife states he eats a lot. Gave heart healthy diet and encouraged to have Medical Doctor keep check of sugars. Graylon Good RN BSN 02/10/2016 12:01 PM

## 2016-02-10 NOTE — Care Management Important Message (Signed)
Important Message  Patient Details  Name: Trevor Ward MRN: [id number] Date of Birth: 23-Feb-1941   Medicare Important Message Given:  Yes    Dawayne Patricia, RN 02/10/2016, 10:35 AM

## 2016-02-11 ENCOUNTER — Other Ambulatory Visit: Payer: Self-pay | Admitting: *Deleted

## 2016-02-11 DIAGNOSIS — R110 Nausea: Secondary | ICD-10-CM

## 2016-02-11 MED ORDER — ONDANSETRON HCL 4 MG PO TABS: 4.0000 mg | ORAL_TABLET | Freq: Three times a day (TID) | ORAL | Status: DC | PRN

## 2016-02-16 ENCOUNTER — Ambulatory Visit (INDEPENDENT_AMBULATORY_CARE_PROVIDER_SITE_OTHER): Payer: Self-pay | Admitting: Cardiothoracic Surgery

## 2016-02-16 VITALS — BP 113/75 | HR 93 | Resp 16

## 2016-02-16 DIAGNOSIS — Z951 Presence of aortocoronary bypass graft: Secondary | ICD-10-CM

## 2016-02-16 DIAGNOSIS — I25110 Atherosclerotic heart disease of native coronary artery with unstable angina pectoris: Secondary | ICD-10-CM

## 2016-02-16 NOTE — Progress Notes (Signed)
[address]       [place] [address]             [phone]      Kingsley A Hollifield [facility] Medical Record [id number] Date of Birth: 1941-01-28  Referring: Burnell Blanks* Primary Care: Mayra Neer, MD  Chief Complaint:   POST OP FOLLOW UP  History of Present Illness:     02/02/2016 OPERATIVE REPORT PREOPERATIVE DIAGNOSIS: Coronary occlusive disease with unstable angina. POSTOPERATIVE DIAGNOSIS: Coronary occlusive disease with unstable angina. SURGICAL PROCEDURE: Coronary artery bypass grafting x4 with left internal mammary to left anterior descending coronary artery, sequential reverse saphenous vein graft to the first and second intermediate reverse saphenous vein graft to the posterior descending with the left greater saphenous vein endoscopic harvesting left thigh. SURGEON: Lanelle Bal, MD.  Patient comes to the office today following recent discharge from coronary artery bypass grafting. Since discharge she is continued to make good progress at home denies fever or chills has had no wound drainage, his in-hospital confusion has improved. He comes in today because of pain in his left wrist that started yesterday. He does have a history of gout in the past primarily in his ankles.    Past Medical History  Diagnosis Date  . Hypertension   . Peripheral vascular disease ([place])     carotid artery stenosis  . History of hiatal hernia   . Depression   . GERD (gastroesophageal reflux disease)   . History of gout   . Stroke [facility]) 2016    denies residual on 02/01/2016  . High cholesterol     "can't take RX; made my legs weak" (02/01/2016)  . Skin cancer of face      "the good kind"  . Arthritis     "fingers" (02/01/2016)     History  Smoking status  . Former Smoker -- 1.5 years  . Types: Cigarettes  . Start date: 01/28/1956  Smokeless tobacco  . Never Used    Comment: "quit smoking cigarettes when I was 75 yr old"      History  Alcohol Use No     Allergies  Allergen Reactions  . Statins Other (See Comments)    Severe myalgias    Current Outpatient Prescriptions  Medication Sig Dispense Refill  . allopurinol (ZYLOPRIM) 100 MG tablet Take 100 mg by mouth 2 (two) times daily.    Marland Kitchen amitriptyline (ELAVIL) 10 MG tablet Take 10 mg by mouth at bedtime.     Marland Kitchen aspirin EC 81 MG EC tablet Take 1 tablet (81 mg total) by mouth daily.    Marland Kitchen esomeprazole (NEXIUM) 40 MG capsule Take 40 mg by mouth 2 (two) times daily before a meal.     . Glucosamine Sulfate (GLUCOSAMINE RELIEF) 1000 MG TABS Take 2,000 mg by mouth daily.    Marland Kitchen guaiFENesin (MUCINEX) 600 MG 12 hr tablet Take 1 tablet (600 mg total) by mouth every 12 (twelve) hours as needed for cough.    . losartan (COZAAR) 50 MG tablet Take 1 tablet (50 mg total) by mouth daily. 30 tablet 3  . metoprolol tartrate (LOPRESSOR) 25 MG tablet Take 1 tablet (25 mg total) by mouth 2 (two) times daily. 60 tablet 3  . Omega-3 Fatty Acids (FISH OIL) 1200 MG CAPS Take 1,200 mg by mouth daily.    . ondansetron (ZOFRAN) 4 MG tablet Take 1 tablet (4 mg total) by mouth every 8 (eight) hours as needed for nausea or  vomiting. 20 tablet 0  . rosuvastatin (CRESTOR) 5 MG tablet Take 1 tablet (5 mg total) by mouth daily. 30 tablet 3  . traMADol (ULTRAM) 50 MG tablet Take 1 tablet (50 mg total) by mouth every 4 (four) hours as needed for moderate pain. 30 tablet 0  . vitamin B-12 (CYANOCOBALAMIN) 500 MCG tablet Take 500 mcg by mouth daily.     No current facility-administered medications for this visit.       Physical Exam: BP 113/75 mmHg  Pulse 93  Resp 16  SpO2 93%  General appearance: alert, cooperative and no distress Neurologic: intact Heart: regular rate and rhythm, S1, S2 normal, no murmur, click, rub or gallop Lungs: clear to auscultation bilaterally Abdomen: soft, non-tender; bowel sounds normal; no masses,  no organomegaly Extremities: extremities normal, atraumatic,  no cyanosis or edema and There's no evidence of phlebitis in either arm from previous IV sites or arterial line or cath sites, the left wrist is a slightly swollen compared the right Wound: Patient sternal since incision is well-healed there is no drainage or evidence of cellulitis   Diagnostic Studies & Laboratory data:     Recent Radiology Findings:   No results found.    Recent Lab Findings: Lab Results  Component Value Date   WBC 7.6 02/08/2016   HGB 11.2* 02/08/2016   HCT 34.2* 02/08/2016   PLT 275 02/08/2016   GLUCOSE 105* 02/08/2016   CHOL 181 04/12/2015   TRIG 126 04/12/2015   HDL 26* 04/12/2015   LDLCALC 130* 04/12/2015   ALT 22 02/07/2016   AST 28 02/07/2016   NA 136 02/08/2016   K 3.7 02/08/2016   CL 100* 02/08/2016   CREATININE 0.84 02/08/2016   BUN 15 02/08/2016   CO2 25 02/08/2016   TSH 2.55 01/31/2016   INR 1.41 02/02/2016   HGBA1C 6.4* 02/02/2016      Assessment / Plan:    Patient doing well following coronary artery bypass grafting, on exam today he may have very early gout symptoms in the left wrist  His daughter will ensure that he is actually taking his previously prescribed allopurinol on a regular basis. If he continues with discomfort in the wrist will contact his primary care doctor for prescription for colchicine. We'll plan to see him back at his regular appointment in 3 weeks .      Grace Isaac MD      [address] [place] [address] Office [phone]   Beeper [phone]  02/16/2016 5:45 PM

## 2016-02-17 ENCOUNTER — Encounter: Payer: Self-pay | Admitting: Cardiothoracic Surgery

## 2016-02-28 ENCOUNTER — Ambulatory Visit (INDEPENDENT_AMBULATORY_CARE_PROVIDER_SITE_OTHER): Payer: Medicare Other | Admitting: Cardiology

## 2016-02-28 ENCOUNTER — Encounter: Payer: Self-pay | Admitting: Cardiology

## 2016-02-28 VITALS — BP 94/52 | HR 100 | Ht 68.0 in | Wt 182.5 lb

## 2016-02-28 DIAGNOSIS — Z9889 Other specified postprocedural states: Secondary | ICD-10-CM | POA: Diagnosis not present

## 2016-02-28 DIAGNOSIS — I2510 Atherosclerotic heart disease of native coronary artery without angina pectoris: Secondary | ICD-10-CM

## 2016-02-28 DIAGNOSIS — Z8673 Personal history of transient ischemic attack (TIA), and cerebral infarction without residual deficits: Secondary | ICD-10-CM

## 2016-02-28 DIAGNOSIS — R55 Syncope and collapse: Secondary | ICD-10-CM | POA: Diagnosis not present

## 2016-02-28 DIAGNOSIS — Z951 Presence of aortocoronary bypass graft: Secondary | ICD-10-CM

## 2016-02-28 NOTE — Patient Instructions (Addendum)
Medication Instructions: STOP LOSARTAN   Labwork: NONE  Testing/Procedures: NONE  Follow-Up: Your physician recommends that you schedule a follow-up appointment in: [place]   If you need a refill on your cardiac medications before your next appointment, please call your pharmacy.

## 2016-02-28 NOTE — Assessment & Plan Note (Signed)
Oct 2016

## 2016-02-28 NOTE — Assessment & Plan Note (Signed)
Pt has had two episodes of near syncope when standing since his CABG

## 2016-02-28 NOTE — Assessment & Plan Note (Signed)
Oct 2016- no significant residual

## 2016-02-28 NOTE — Progress Notes (Signed)
02/28/2016 Trevor Ward   11-Jan-1941  [id number]  Primary Physician Mayra Neer, MD Primary Cardiologist: Dr Sallyanne Kuster  HPI:  75 y/o male seen by Dr Sallyanne Kuster in May with complaints of chest discomfort and dyspnea. He had had a GI work up for epigastric pain before this that was normal and he was referred to cardiology. A Myoview was done and was abnormal. Subsequent coronary angiogram 02/01/16 revealed 3V CAD and the pt underwent CABG x 4 on 02/02/16. He was slow to recover post op but had no real complications. He is in the office today for follow up. His main complaint is anorexia and weakness. His daughter did describe two episodes of near syncope while standing. The pt says he can tell when they come on and knows he has to sit down.    Current Outpatient Prescriptions  Medication Sig Dispense Refill  . allopurinol (ZYLOPRIM) 100 MG tablet Take 100 mg by mouth 2 (two) times daily.    Marland Kitchen amitriptyline (ELAVIL) 10 MG tablet Take 10 mg by mouth at bedtime.     Marland Kitchen aspirin EC 81 MG EC tablet Take 1 tablet (81 mg total) by mouth daily.    Marland Kitchen esomeprazole (NEXIUM) 40 MG capsule Take 40 mg by mouth 2 (two) times daily before a meal.     . Glucosamine Sulfate (GLUCOSAMINE RELIEF) 1000 MG TABS Take 2,000 mg by mouth daily.    Marland Kitchen guaiFENesin (MUCINEX) 600 MG 12 hr tablet Take 1 tablet (600 mg total) by mouth every 12 (twelve) hours as needed for cough.    . metoprolol tartrate (LOPRESSOR) 25 MG tablet Take 1 tablet (25 mg total) by mouth 2 (two) times daily. 60 tablet 3  . Omega-3 Fatty Acids (FISH OIL) 1200 MG CAPS Take 1,200 mg by mouth daily.    . ondansetron (ZOFRAN) 4 MG tablet Take 1 tablet (4 mg total) by mouth every 8 (eight) hours as needed for nausea or vomiting. 20 tablet 0  . rosuvastatin (CRESTOR) 5 MG tablet Take 1 tablet (5 mg total) by mouth daily. 30 tablet 3  . traMADol (ULTRAM) 50 MG tablet Take 1 tablet (50 mg total) by mouth every 4 (four) hours as needed for moderate pain. 30  tablet 0  . vitamin B-12 (CYANOCOBALAMIN) 500 MCG tablet Take 500 mcg by mouth daily.     No current facility-administered medications for this visit.    Allergies  Allergen Reactions  . Statins Other (See Comments)    Severe myalgias    Social History   Social History  . Marital Status: Married    Spouse Name: N/A  . Number of Children: N/A  . Years of Education: N/A   Occupational History  . Not on file.   Social History Main Topics  . Smoking status: Former Smoker -- 1.5 years    Types: Cigarettes    Start date: 01/28/1956  . Smokeless tobacco: Never Used     Comment: "quit smoking cigarettes when I was 75 yr old"  . Alcohol Use: No  . Drug Use: No  . Sexual Activity: Yes   Other Topics Concern  . Not on file   Social History Narrative   Epworth Sleepiness Scale = 16 (as of 01/28/16)     Review of Systems: General: negative for chills, fever, night sweats or weight changes.  Cardiovascular: negative for chest pain, dyspnea on exertion, edema, orthopnea, palpitations, paroxysmal nocturnal dyspnea or shortness of breath Dermatological: negative for rash Respiratory: negative for cough  or wheezing Urologic: negative for hematuria Abdominal: negative for nausea, vomiting, diarrhea, bright red blood per rectum, melena, or hematemesis Neurologic: negative for visual changes, syncope, or dizziness All other systems reviewed and are otherwise negative except as noted above.    Blood pressure 94/52, pulse 100, height 5\' 8"  (1.727 m), weight 182 lb 8 oz (82.781 kg).  General appearance: alert, cooperative and no distress Neck: no carotid bruit, no JVD and RCE scar Lungs: basilar crackles bilateraly.  Heart: regular rate and rhythm Extremities: no edema Skin: pale cool dry Neurologic: Grossly normal  EKG [id number]  ASSESSMENT AND PLAN:   S/P CABG x 4 02/02/16 LIMA-LAD, SVG-RI1 and RI2, SVG-PDA-normal LVF  Essential hypertension Now hypotensive-stop  Cozaar  History of Rt CA endarterectomy Oct 2016  History of CVA (cerebrovascular accident) Oct 2016- no significant residual  Near syncope Pt has had two episodes of near syncope when standing since his CABG   PLAN  Stop Cozaar. I encouraged him to walk as tolerated for exercise. His family says he has been driving although he was advised against this. I reinforced that he should not be driving till cleared by the surgeon. F/U Dr Sallyanne Kuster in 6 weeks.   Kerin Ransom PA-C 02/28/2016 10:41 AM

## 2016-02-28 NOTE — Assessment & Plan Note (Signed)
LIMA-LAD, SVG-RI1 and RI2, SVG-PDA-normal LVF

## 2016-02-28 NOTE — Assessment & Plan Note (Signed)
Now hypotensive-stop Cozaar

## 2016-03-07 ENCOUNTER — Ambulatory Visit: Payer: Medicare Other | Admitting: Cardiology

## 2016-03-08 ENCOUNTER — Other Ambulatory Visit: Payer: Self-pay | Admitting: Cardiothoracic Surgery

## 2016-03-08 DIAGNOSIS — Z951 Presence of aortocoronary bypass graft: Secondary | ICD-10-CM

## 2016-03-09 DIAGNOSIS — Z85828 Personal history of other malignant neoplasm of skin: Secondary | ICD-10-CM | POA: Diagnosis not present

## 2016-03-09 DIAGNOSIS — L570 Actinic keratosis: Secondary | ICD-10-CM | POA: Diagnosis not present

## 2016-03-13 ENCOUNTER — Ambulatory Visit
Admission: RE | Admit: 2016-03-13 | Discharge: 2016-03-13 | Disposition: A | Discharge status: Home / Self Care | Payer: Medicare Other | Source: Ambulatory Visit | Attending: Cardiothoracic Surgery | Admitting: Cardiothoracic Surgery

## 2016-03-13 ENCOUNTER — Ambulatory Visit (INDEPENDENT_AMBULATORY_CARE_PROVIDER_SITE_OTHER): Payer: Self-pay | Admitting: Physician Assistant

## 2016-03-13 VITALS — BP 100/69 | HR 83 | Resp 16 | Ht 68.0 in | Wt 182.0 lb

## 2016-03-13 DIAGNOSIS — Z951 Presence of aortocoronary bypass graft: Secondary | ICD-10-CM

## 2016-03-13 DIAGNOSIS — Z950 Presence of cardiac pacemaker: Secondary | ICD-10-CM | POA: Diagnosis not present

## 2016-03-13 DIAGNOSIS — I25110 Atherosclerotic heart disease of native coronary artery with unstable angina pectoris: Secondary | ICD-10-CM

## 2016-03-13 NOTE — Progress Notes (Signed)
  HPI:  Patient returns for routine postoperative follow-up having undergone CABG x 4 on 02/02/2016 [name] early postoperative recovery while in the hospital was notable for decreased appetite, dehydration, nausea and vomiting.  Since hospital discharge the patient reports he is doing okay.  He continues to have issues with decreased taste, abdominal pain, and inability to eat.  He is ambulating without difficulty.  He is moving his bowels and his activity is increasing as tolerated.  He is mainly concerned with his continued GI problems.  He wants to know what else can be done to help correct these problems... As they have been present since his neck surgery.   Current Outpatient Prescriptions  Medication Sig Dispense Refill  . allopurinol (ZYLOPRIM) 100 MG tablet Take 100 mg by mouth 2 (two) times daily.    Marland Kitchen amitriptyline (ELAVIL) 10 MG tablet Take 10 mg by mouth at bedtime.     Marland Kitchen aspirin EC 81 MG EC tablet Take 1 tablet (81 mg total) by mouth daily.    Marland Kitchen esomeprazole (NEXIUM) 40 MG capsule Take 40 mg by mouth 2 (two) times daily before a meal.     . Glucosamine Sulfate (GLUCOSAMINE RELIEF) 1000 MG TABS Take 2,000 mg by mouth daily.    . metoprolol tartrate (LOPRESSOR) 25 MG tablet Take 1 tablet (25 mg total) by mouth 2 (two) times daily. 60 tablet 3  . Omega-3 Fatty Acids (FISH OIL) 1200 MG CAPS Take 1,200 mg by mouth daily.    . ondansetron (ZOFRAN) 4 MG tablet Take 1 tablet (4 mg total) by mouth every 8 (eight) hours as needed for nausea or vomiting. 20 tablet 0  . rosuvastatin (CRESTOR) 5 MG tablet Take 1 tablet (5 mg total) by mouth daily. 30 tablet 3  . vitamin B-12 (CYANOCOBALAMIN) 500 MCG tablet Take 500 mcg by mouth daily.    . traMADol (ULTRAM) 50 MG tablet Take 1 tablet (50 mg total) by mouth every 4 (four) hours as needed for moderate pain. (Patient not taking: Reported on 03/13/2016) 30 tablet 0   No current facility-administered medications for this visit.    Physical  Exam:  BP 100/69 mmHg  Pulse 83  Resp 16  Ht 5\' 8"  (1.727 m)  Wt 182 lb (82.555 kg)  BMI 27.68 kg/m2  SpO2 95%  Gen: no apparent distress Heart: RRR Lung: CTA bilaterally Abd: soft, non-tender, mild distention Ext: no edema Incisions: well healed  Diagnostic Tests:  CXR: no significant pleural effusions, wires are intact, post surgical changes  A/P:  1. S/p CABG x 4 doing well from surgery stand point 2. GI- continued Abd pain, decreased appetite and taste.... Was undergoing GI workup prior to surgery which was negative to date.  He is scheduled to see the GI doctor on Thursday and I encouraged the patient to discuss his current symptoms at that visit so workup can continued 3. RTC prn, follow up with Cardiology as scheduled, RTC in 3 months to see Dr. Damita Dunnings, PA-C [facility] [phone]

## 2016-03-16 ENCOUNTER — Ambulatory Visit: Payer: Medicare Other | Admitting: Cardiothoracic Surgery

## 2016-03-16 DIAGNOSIS — R110 Nausea: Secondary | ICD-10-CM | POA: Diagnosis not present

## 2016-03-27 DIAGNOSIS — F411 Generalized anxiety disorder: Secondary | ICD-10-CM | POA: Diagnosis not present

## 2016-03-27 DIAGNOSIS — I700 Atherosclerosis of aorta: Secondary | ICD-10-CM | POA: Diagnosis not present

## 2016-03-27 DIAGNOSIS — E782 Mixed hyperlipidemia: Secondary | ICD-10-CM | POA: Diagnosis not present

## 2016-03-27 DIAGNOSIS — K449 Diaphragmatic hernia without obstruction or gangrene: Secondary | ICD-10-CM | POA: Diagnosis not present

## 2016-03-27 DIAGNOSIS — Z8673 Personal history of transient ischemic attack (TIA), and cerebral infarction without residual deficits: Secondary | ICD-10-CM | POA: Diagnosis not present

## 2016-03-27 DIAGNOSIS — M109 Gout, unspecified: Secondary | ICD-10-CM | POA: Diagnosis not present

## 2016-03-27 DIAGNOSIS — I779 Disorder of arteries and arterioles, unspecified: Secondary | ICD-10-CM | POA: Diagnosis not present

## 2016-03-27 DIAGNOSIS — K219 Gastro-esophageal reflux disease without esophagitis: Secondary | ICD-10-CM | POA: Diagnosis not present

## 2016-03-27 DIAGNOSIS — G4700 Insomnia, unspecified: Secondary | ICD-10-CM | POA: Diagnosis not present

## 2016-03-27 DIAGNOSIS — I119 Hypertensive heart disease without heart failure: Secondary | ICD-10-CM | POA: Diagnosis not present

## 2016-03-27 DIAGNOSIS — Z0000 Encounter for general adult medical examination without abnormal findings: Secondary | ICD-10-CM | POA: Diagnosis not present

## 2016-04-04 ENCOUNTER — Encounter (HOSPITAL_COMMUNITY)
Admission: RE | Admit: 2016-04-04 | Discharge: 2016-04-04 | Disposition: A | Discharge status: Home / Self Care | Payer: Medicare Other | Source: Ambulatory Visit | Attending: Cardiovascular Disease | Admitting: Cardiovascular Disease

## 2016-04-04 VITALS — BP 108/60 | HR 72 | Ht 69.25 in | Wt 184.3 lb

## 2016-04-04 DIAGNOSIS — Z951 Presence of aortocoronary bypass graft: Secondary | ICD-10-CM | POA: Insufficient documentation

## 2016-04-04 NOTE — Progress Notes (Signed)
Cardiac Rehab Medication Review by a Pharmacist  Does the patient  feel that his/her medications are working for him/her?  no  Has the patient been experiencing any side effects to the medications prescribed?  no  Does the patient measure his/her own blood pressure or blood glucose at home?  yes   Does the patient have any problems obtaining medications due to transportation or finances?   no  Understanding of regimen: poor Understanding of indications: poor Potential of compliance: poor    Pharmacist comments: CH is 75 yo M presenting for cardiac rehab appointment. Pt reports no AE at this time, but is fairly unclear regarding his medication regimen. Pt does report that he monitors BP regularly. No other issues noted during this appointment.    Einar Grad, PharmD 04/04/2016 2:59 PM

## 2016-04-06 ENCOUNTER — Encounter (HOSPITAL_COMMUNITY): Payer: Self-pay

## 2016-04-06 NOTE — Progress Notes (Signed)
Cardiac Individual Treatment Plan  Patient Details  Name: Trevor Ward MRN: [id number] Date of Birth: 01/19/1941 Referring Provider:        CARDIAC REHAB PHASE II ORIENTATION from 04/04/2016 in [place]   Referring Provider  Croitoru, Dani Gobble, MD      Initial Encounter Date:       [facility] from 04/04/2016 in [place]   Date  04/04/16   Referring Provider  Croitoru, Mihai, MD      Visit Diagnosis: S/P CABG x 4  Patient's Home Medications on Admission:  Current outpatient prescriptions:  .  allopurinol (ZYLOPRIM) 100 MG tablet, Take 100 mg by mouth daily. , Disp: , Rfl:  .  amitriptyline (ELAVIL) 10 MG tablet, Take 10 mg by mouth at bedtime. , Disp: , Rfl:  .  aspirin EC 81 MG EC tablet, Take 1 tablet (81 mg total) by mouth daily., Disp: , Rfl:  .  esomeprazole (NEXIUM) 40 MG capsule, Take 40 mg by mouth daily. , Disp: , Rfl:  .  Glucosamine Sulfate (GLUCOSAMINE RELIEF) 1000 MG TABS, Take 2,000 mg by mouth daily., Disp: , Rfl:  .  metoprolol tartrate (LOPRESSOR) 25 MG tablet, Take 1 tablet (25 mg total) by mouth 2 (two) times daily. (Patient taking differently: Take 25 mg by mouth daily. ), Disp: 60 tablet, Rfl: 3 .  Omega-3 Fatty Acids (FISH OIL) 1200 MG CAPS, Take 1,200 mg by mouth daily. Pt reports taking a few times a week., Disp: , Rfl:  .  ondansetron (ZOFRAN) 4 MG tablet, Take 1 tablet (4 mg total) by mouth every 8 (eight) hours as needed for nausea or vomiting., Disp: 20 tablet, Rfl: 0 .  rosuvastatin (CRESTOR) 5 MG tablet, Take 1 tablet (5 mg total) by mouth daily., Disp: 30 tablet, Rfl: 3 .  vitamin B-12 (CYANOCOBALAMIN) 500 MCG tablet, Take 500 mcg by mouth daily., Disp: , Rfl:  .  traMADol (ULTRAM) 50 MG tablet, Take 1 tablet (50 mg total) by mouth every 4 (four) hours as needed for moderate pain. (Patient not taking: Reported on 03/13/2016), Disp: 30 tablet, Rfl: 0  Past Medical  History: Past Medical History  Diagnosis Date  . Hypertension   . Peripheral vascular disease ([place])     carotid artery stenosis  . History of hiatal hernia   . Depression   . GERD (gastroesophageal reflux disease)   . History of gout   . Stroke [facility]) 2016    denies residual on 02/01/2016  . High cholesterol     "can't take RX; made my legs weak" (02/01/2016)  . Skin cancer of face      "the good kind"  . Arthritis     "fingers" (02/01/2016)    Tobacco Use: History  Smoking status  . Former Smoker -- 1.5 years  . Types: Cigarettes  . Start date: 01/28/1956  Smokeless tobacco  . Never Used    Comment: "quit smoking cigarettes when I was 75 yr old"    Labs:     Recent Review Flowsheet Data    Labs for ITP Cardiac and Pulmonary Rehab Latest Ref Rng 02/02/2016 02/02/2016 02/02/2016 02/02/2016 02/03/2016   PHART 7.350 - 7.450 7.435 7.326(L) 7.324(L) - -   PCO2ART 35.0 - 45.0 mmHg 36.9 40.2 42.8 - -   HCO3 20.0 - 24.0 mEq/L 24.9(H) 20.9 22.1 - -   TCO2 0 - 100 mmol/L 26 22 23 22  23  ACIDBASEDEF 0.0 - 2.0 mmol/L - 5.0(H) 4.0(H) - -   O2SAT - 98.0 93.0 91.0 - -      Capillary Blood Glucose: Lab Results  Component Value Date   GLUCAP 128* 02/05/2016   GLUCAP 97 02/04/2016   GLUCAP 91 02/04/2016   GLUCAP 117* 02/04/2016   GLUCAP 121* 02/04/2016     Exercise Target Goals: Date: 04/04/16  Exercise Program Goal: Individual exercise prescription set with THRR, safety & activity barriers. Participant demonstrates ability to understand and report RPE using BORG scale, to self-measure pulse accurately, and to acknowledge the importance of the exercise prescription.  Exercise Prescription Goal: Starting with aerobic activity 30 plus minutes a day, 3 days per week for initial exercise prescription. Provide home exercise prescription and guidelines that participant acknowledges understanding prior to discharge.  Activity Barriers & Risk Stratification:     Activity Barriers &  Cardiac Risk Stratification - 04/06/16 1258    Activity Barriers & Cardiac Risk Stratification   Activity Barriers History of Falls      6 Minute Walk:     6 Minute Walk      04/04/16 1706       6 Minute Walk   Phase Initial     Distance 1495 feet     Walk Time 6 minutes     # of Rest Breaks 0     MPH 2.83     METS 2.77     RPE 11     VO2 Peak 9.71     Symptoms No     Resting HR 72 bpm     Resting BP 108/60 mmHg     Max Ex. HR 92 bpm     Max Ex. BP 120/70 mmHg     2 Minute Post BP 112/70 mmHg        Initial Exercise Prescription:     Initial Exercise Prescription - 04/04/16 1700    Date of Initial Exercise RX and Referring Provider   Date 04/04/16   Referring Provider Croitoru, Dani Gobble, MD   Bike   Level 0.8   Minutes 10   METs 2.84   NuStep   Level 2   Minutes 10   METs 2.2   Track   Laps 8   Minutes 10   METs 2.39   Prescription Details   Frequency (times per week) 3   Duration Progress to 30 minutes of continuous aerobic without signs/symptoms of physical distress   Intensity   THRR 40-80% of Max Heartrate 57-114   Ratings of Perceived Exertion 11-13   Perceived Dyspnea 0-4      Perform Capillary Blood Glucose checks as needed.  Exercise Prescription Changes:   Exercise Comments:   Discharge Exercise Prescription (Final Exercise Prescription Changes):   Nutrition:  Target Goals: Understanding of nutrition guidelines, daily intake of sodium 1500mg , cholesterol 200mg , calories 30% from fat and 7% or less from saturated fats, daily to have 5 or more servings of fruits and vegetables.  Biometrics:     Pre Biometrics - 04/04/16 1709    Pre Biometrics   Waist Circumference 38.5 inches   Hip Circumference 40 inches   Waist to Hip Ratio 0.96 %   Triceps Skinfold 10 mm   % Body Fat 25.1 %   Grip Strength 38 kg   Flexibility 11 in   Single Leg Stand 5.84 seconds       Nutrition Therapy Plan and Nutrition Goals:     Nutrition  Therapy &  Goals - 04/05/16 1524    Nutrition Therapy   Diet Therapeutic Lifestyle Changes   Personal Nutrition Goals   Personal Goal #1 Pt to have a basic understanding of a diabetic diet given A1c of 6.4   Intervention Plan   Intervention Prescribe, educate and counsel regarding individualized specific dietary modifications aiming towards targeted core components such as weight, hypertension, lipid management, diabetes, heart failure and other comorbidities.   Expected Outcomes Short Term Goal: Understand basic principles of dietary content, such as calories, fat, sodium, cholesterol and nutrients.;Long Term Goal: Adherence to prescribed nutrition plan.      Nutrition Discharge: Nutrition Scores:   Nutrition Goals Re-Evaluation:   Psychosocial: Target Goals: Acknowledge presence or absence of depression, maximize coping skills, provide positive support system. Participant is able to verbalize types and ability to use techniques and skills needed for reducing stress and depression.  Initial Review & Psychosocial Screening:     Initial Psych Review & Screening - 04/06/16 [place]? Yes   Barriers   Psychosocial barriers to participate in program The patient should benefit from training in stress management and relaxation.   Screening Interventions   Interventions Encouraged to exercise      Quality of Life Scores:   PHQ-9:     Recent Review Flowsheet Data    There is no flowsheet data to display.      Psychosocial Evaluation and Intervention:   Psychosocial Re-Evaluation:   Vocational Rehabilitation: Provide vocational rehab assistance to qualifying candidates.   Vocational Rehab Evaluation & Intervention:     Vocational Rehab - 04/06/16 1301    Initial Vocational Rehab Evaluation & Intervention   Assessment shows need for Vocational Rehabilitation No      Education: Education Goals: Education classes will be provided  on a weekly basis, covering required topics. Participant will state understanding/return demonstration of topics presented.  Learning Barriers/Preferences:     Learning Barriers/Preferences - 04/06/16 1259    Learning Barriers/Preferences   Learning Barriers Hearing  memory deficits   Learning Preferences Pictoral;Audio  Patient completed the 7th grade      Education Topics: Count Your Pulse:  -Group instruction provided by verbal instruction, demonstration, patient participation and written materials to support subject.  Instructors address importance of being able to find your pulse and how to count your pulse when at home without a heart monitor.  Patients get hands on experience counting their pulse with staff help and individually.   Heart Attack, Angina, and Risk Factor Modification:  -Group instruction provided by verbal instruction, video, and written materials to support subject.  Instructors address signs and symptoms of angina and heart attacks.    Also discuss risk factors for heart disease and how to make changes to improve heart health risk factors.   Functional Fitness:  -Group instruction provided by verbal instruction, demonstration, patient participation, and written materials to support subject.  Instructors address safety measures for doing things around the house.  Discuss how to get up and down off the floor, how to pick things up properly, how to safely get out of a chair without assistance, and balance training.   Meditation and Mindfulness:  -Group instruction provided by verbal instruction, patient participation, and written materials to support subject.  Instructor addresses importance of mindfulness and meditation practice to help reduce stress and improve awareness.  Instructor also leads participants through a meditation exercise.    Stretching for Flexibility and Mobility:  -Group  instruction provided by verbal instruction, patient participation, and  written materials to support subject.  Instructors lead participants through series of stretches that are designed to increase flexibility thus improving mobility.  These stretches are additional exercise for major muscle groups that are typically performed during regular warm up and cool down.   Hands Only CPR Anytime:  -Group instruction provided by verbal instruction, video, patient participation and written materials to support subject.  Instructors co-teach with AHA video for hands only CPR.  Participants get hands on experience with mannequins.   Nutrition I class: Heart Healthy Eating:  -Group instruction provided by PowerPoint slides, verbal discussion, and written materials to support subject matter. The instructor gives an explanation and review of the Therapeutic Lifestyle Changes diet recommendations, which includes a discussion on lipid goals, dietary fat, sodium, fiber, plant stanol/sterol esters, sugar, and the components of a well-balanced, healthy diet.   Nutrition II class: Lifestyle Skills:  -Group instruction provided by PowerPoint slides, verbal discussion, and written materials to support subject matter. The instructor gives an explanation and review of label reading, grocery shopping for heart health, heart healthy recipe modifications, and ways to make healthier choices when eating out.   Diabetes Question & Answer:  -Group instruction provided by PowerPoint slides, verbal discussion, and written materials to support subject matter. The instructor gives an explanation and review of diabetes co-morbidities, pre- and post-prandial blood glucose goals, pre-exercise blood glucose goals, signs, symptoms, and treatment of hypoglycemia and hyperglycemia, and foot care basics.   Diabetes Blitz:  -Group instruction provided by PowerPoint slides, verbal discussion, and written materials to support subject matter. The instructor gives an explanation and review of the physiology  behind type 1 and type 2 diabetes, diabetes medications and rational behind using different medications, pre- and post-prandial blood glucose recommendations and Hemoglobin A1c goals, diabetes diet, and exercise including blood glucose guidelines for exercising safely.    Portion Distortion:  -Group instruction provided by PowerPoint slides, verbal discussion, written materials, and food models to support subject matter. The instructor gives an explanation of serving size versus portion size, changes in portions sizes over the last 20 years, and what consists of a serving from each food group.   Stress Management:  -Group instruction provided by verbal instruction, video, and written materials to support subject matter.  Instructors review role of stress in heart disease and how to cope with stress positively.     Exercising on Your Own:  -Group instruction provided by verbal instruction, power point, and written materials to support subject.  Instructors discuss benefits of exercise, components of exercise, frequency and intensity of exercise, and end points for exercise.  Also discuss use of nitroglycerin and activating EMS.  Review options of places to exercise outside of rehab.  Review guidelines for sex with heart disease.   Cardiac Drugs I:  -Group instruction provided by verbal instruction and written materials to support subject.  Instructor reviews cardiac drug classes: antiplatelets, anticoagulants, beta blockers, and statins.  Instructor discusses reasons, side effects, and lifestyle considerations for each drug class.   Cardiac Drugs II:  -Group instruction provided by verbal instruction and written materials to support subject.  Instructor reviews cardiac drug classes: angiotensin converting enzyme inhibitors (ACE-I), angiotensin II receptor blockers (ARBs), nitrates, and calcium channel blockers.  Instructor discusses reasons, side effects, and lifestyle considerations for each drug  class.   Anatomy and Physiology of the Circulatory System:  -Group instruction provided by verbal instruction, video, and written materials to support  subject.  Reviews functional anatomy of heart, how it relates to various diagnoses, and what role the heart plays in the overall system.   Knowledge Questionnaire Score:     Knowledge Questionnaire Score - 04/04/16 1711    Knowledge Questionnaire Score   Pre Score 19/24      Core Components/Risk Factors/Patient Goals at Admission:     Personal Goals and Risk Factors at Admission - 04/07/16 0748    Core Components/Risk Factors/Patient Goals on Admission   Increase Strength and Stamina Yes   Intervention Provide advice, education, support and counseling about physical activity/exercise needs.;Develop an individualized exercise prescription for aerobic and resistive training based on initial evaluation findings, risk stratification, comorbidities and participant's personal goals.   Expected Outcomes Achievement of increased cardiorespiratory fitness and enhanced flexibility, muscular endurance and strength shown through measurements of functional capacity and personal statement of participant.   Personal Goal Other Yes   Personal Goal Patient would like to be able to "run with his dogs". Get back to singing. Increase energy level.   Intervention Provide individualized exercise prescription including aerobic exercise 3-5 days per week and resistance training 2 days per week to improve cardiorespiratory fitness and functional capacity.   Expected Outcomes Participate in leisure activities (ie singing, running with his dogs) with sufficient energy.      Core Components/Risk Factors/Patient Goals Review:    Core Components/Risk Factors/Patient Goals at Discharge (Final Review):    ITP Comments:     ITP Comments      04/04/16 1341           ITP Comments Medical Director- Dr. Fransico Him, MD          Comments: Patient attended  orientation from 1330 to 1530 to review rules and guidelines for program. Completed 6 minute walk test, Intitial ITP, and exercise prescription.  VSS. Telemetry Sinus rhythm with a tall t wave this has been previously documented .  Asymptomatic.Harrell Gave RN BSN

## 2016-04-12 ENCOUNTER — Encounter (HOSPITAL_COMMUNITY)
Admission: RE | Admit: 2016-04-12 | Discharge: 2016-04-12 | Disposition: A | Discharge status: Home / Self Care | Payer: Medicare Other | Source: Ambulatory Visit | Attending: Cardiovascular Disease | Admitting: Cardiovascular Disease

## 2016-04-12 DIAGNOSIS — Z951 Presence of aortocoronary bypass graft: Secondary | ICD-10-CM

## 2016-04-12 NOTE — Progress Notes (Signed)
Daily Session Note  Patient Details  Name: Trevor Ward MRN: [id number] Date of Birth: 1940-09-28 Referring Provider:        CARDIAC REHAB PHASE II ORIENTATION from 04/04/2016 in [place]   Referring Provider  Croitoru, Dani Gobble, MD      Encounter Date: 04/12/2016  Check In:   Capillary Blood Glucose: No results found for this or any previous visit (from the past 24 hour(s)).   Goals Met:  Exercise tolerated well Personal goals reviewed  Goals Unmet:  Not Applicable  Comments:  Pt started full day of exercise at the phase II cardiac rehab program.  Pt tolerated light exercise without difficulty. VSS, telemetry-Sr with no ectopy, asymptomatic.  Medication list reconciled. Pt denies barriers to medicaiton compliance.  PSYCHOSOCIAL ASSESSMENT:  PHQ-1. Pt exhibits positive coping skills, hopeful outlook with supportive family. Pt is accompanied by his daughter.  Pt with recent passing of his wife. Things have been difficult for him because her death was sudden and not expected.  Pt is dealing with closure and appreciates the support of his daughter.  No psychosocial needs identified at this time, no psychosocial interventions necessary.    Pt enjoys dancing singing karaoke. Pt desires to be able to run with his dogs. Pt is an avid hunter and would like to be able to hunt with them again.  Will continue to monitor pt progress toward meeting this goal.  Pt oriented to exercise equipment and routine.    Understanding verbalized. Cherre Huger, BSN

## 2016-04-13 ENCOUNTER — Encounter: Payer: Self-pay | Admitting: Cardiovascular Disease

## 2016-04-13 ENCOUNTER — Ambulatory Visit (INDEPENDENT_AMBULATORY_CARE_PROVIDER_SITE_OTHER): Payer: Medicare Other | Admitting: Cardiovascular Disease

## 2016-04-13 VITALS — BP 127/73 | HR 81 | Ht 68.0 in | Wt 189.8 lb

## 2016-04-13 DIAGNOSIS — Z951 Presence of aortocoronary bypass graft: Secondary | ICD-10-CM | POA: Diagnosis not present

## 2016-04-13 DIAGNOSIS — I2510 Atherosclerotic heart disease of native coronary artery without angina pectoris: Secondary | ICD-10-CM

## 2016-04-13 DIAGNOSIS — I10 Essential (primary) hypertension: Secondary | ICD-10-CM

## 2016-04-13 DIAGNOSIS — Z9889 Other specified postprocedural states: Secondary | ICD-10-CM

## 2016-04-13 DIAGNOSIS — Z8673 Personal history of transient ischemic attack (TIA), and cerebral infarction without residual deficits: Secondary | ICD-10-CM

## 2016-04-13 DIAGNOSIS — Z79899 Other long term (current) drug therapy: Secondary | ICD-10-CM | POA: Diagnosis not present

## 2016-04-13 DIAGNOSIS — E785 Hyperlipidemia, unspecified: Secondary | ICD-10-CM | POA: Diagnosis not present

## 2016-04-13 DIAGNOSIS — I700 Atherosclerosis of aorta: Secondary | ICD-10-CM

## 2016-04-13 NOTE — Progress Notes (Signed)
Cardiology Office Note    Date:  04/14/2016   ID:  Trevor Ward, DOB 10/13/1940, MRN [id number]  PCP:  Mayra Neer, MD  Cardiologist:   Sanda Klein, MD   Chief Complaint  Patient presents with  . Follow-up    Pt states no Sx.    History of Present Illness:  Trevor Ward is a 75 y.o. male who is now almost 3 months status post coronary bypass surgery for multivessel CAD (02/02/16: CABG x4 with LIMA to LAD, SVG sequential to the first and second intermediate SVG to PDA, Ceasar Mons, MD.0.  He is turned to feel much better after a slow initial postoperative course. He denies any problems with angina at rest or with activity. He no longer complains of dyspnea. He had one episode of gout during his recovery. And is now taking allopurinol daily. He is not on diuretics but does take aspirin. He denies claudication or leg edema and has not had any new neurological complaints, palpitations or syncope. He has some numbness and stinging around the saphenectomy harvest site.  Past Medical History  Diagnosis Date  . Hypertension   . Peripheral vascular disease ([place])     carotid artery stenosis  . History of hiatal hernia   . Depression   . GERD (gastroesophageal reflux disease)   . History of gout   . Stroke [facility]) 2016    denies residual on 02/01/2016  . High cholesterol     "can't take RX; made my legs weak" (02/01/2016)  . Skin cancer of face      "the good kind"  . Arthritis     "fingers" (02/01/2016)    Past Surgical History  Procedure Laterality Date  . Peripheral vascular catheterization Bilateral 04/14/2015    Procedure: Carotid Angiography;  Surgeon: Serafina Mitchell, MD;  Location: [facility];  Service: Cardiovascular;  Laterality: Bilateral;  . Knee arthroscopy Right   . Colonoscopy    . Endarterectomy Right 04/21/2015    Procedure: RIGHT CAROTID ENDARTERECTOMY;  Surgeon: Elam Dutch, MD;  Location: [place];  Service: Vascular;  Laterality: Right;  .  Esophagogastroduodenoscopy (egd) with propofol N/A 01/03/2016    Procedure: ESOPHAGOGASTRODUODENOSCOPY (EGD) WITH PROPOFOL;  Surgeon: Garlan Fair, MD;  Location: [facility];  Service: Endoscopy;  Laterality: N/A;  . Cardiac catheterization N/A 02/01/2016    Procedure: Left Heart Cath and Coronary Angiography;  Surgeon: Burnell Blanks, MD;  Location: [facility];  Service: Cardiovascular;  Laterality: N/A;  . Skin cancer excision Right     "temple"  . Coronary artery bypass graft N/A 02/02/2016    Procedure: CORONARY ARTERY BYPASS GRAFTING (CABG) times 4 using left internal mammary and left saphenous ven harvested by endoven ;  Surgeon: Grace Isaac, MD;  Location: [place];  Service: Open Heart Surgery;  Laterality: N/A;  . Tee without cardioversion N/A 02/02/2016    Procedure: TRANSESOPHAGEAL ECHOCARDIOGRAM (TEE);  Surgeon: Grace Isaac, MD;  Location: [place];  Service: Open Heart Surgery;  Laterality: N/A;    Current Medications: Outpatient Prescriptions Prior to Visit  Medication Sig Dispense Refill  . allopurinol (ZYLOPRIM) 100 MG tablet Take 100 mg by mouth daily.     Marland Kitchen amitriptyline (ELAVIL) 10 MG tablet Take 10 mg by mouth at bedtime.     Marland Kitchen aspirin EC 81 MG EC tablet Take 1 tablet (81 mg total) by mouth daily.    Marland Kitchen esomeprazole (NEXIUM) 40 MG capsule Take 40 mg  by mouth daily.     . Glucosamine Sulfate (GLUCOSAMINE RELIEF) 1000 MG TABS Take 2,000 mg by mouth daily.    . metoprolol tartrate (LOPRESSOR) 25 MG tablet Take 1 tablet (25 mg total) by mouth 2 (two) times daily. (Patient taking differently: Take 25 mg by mouth daily. Daughter reports pt takes twice a day) 60 tablet 3  . rosuvastatin (CRESTOR) 5 MG tablet Take 1 tablet (5 mg total) by mouth daily. 30 tablet 3  . traMADol (ULTRAM) 50 MG tablet Take 1 tablet (50 mg total) by mouth every 4 (four) hours as needed for moderate pain. 30 tablet 0  . vitamin B-12 (CYANOCOBALAMIN) 500 MCG tablet Take 500 mcg by  mouth daily.    . Omega-3 Fatty Acids (FISH OIL) 1200 MG CAPS Take 1,200 mg by mouth daily. Pt reports taking a few times a week.    . ondansetron (ZOFRAN) 4 MG tablet Take 1 tablet (4 mg total) by mouth every 8 (eight) hours as needed for nausea or vomiting. 20 tablet 0   No facility-administered medications prior to visit.     Allergies:   Statins   Social History   Social History  . Marital Status: Married    Spouse Name: N/A  . Number of Children: N/A  . Years of Education: N/A   Social History Main Topics  . Smoking status: Former Smoker -- 1.5 years    Types: Cigarettes    Start date: 01/28/1956  . Smokeless tobacco: Never Used     Comment: "quit smoking cigarettes when I was 75 yr old"  . Alcohol Use: No  . Drug Use: No  . Sexual Activity: Yes   Other Topics Concern  . None   Social History Narrative   Epworth Sleepiness Scale = 16 (as of 01/28/16)     Family History:  [name] family history includes Arthritis in his mother; Breast cancer in his mother; CAD in his brother, brother, and mother; CVA in his brother, mother, and sister; Diabetes in his brother, brother, and mother; Heart attack in his father; Parkinson's disease in his mother.   ROS:   Please see the history of present illness.    ROS All other systems reviewed and are negative.   PHYSICAL EXAM:   VS:  BP 127/73 mmHg  Pulse 81  Ht 5\' 8"  (1.727 m)  Wt 86.093 kg (189 lb 12.8 oz)  BMI 28.87 kg/m2   GEN: Well nourished, well developed, in no acute distress HEENT: normal Neck: no JVD, carotid bruits, or masses Cardiac: RRR; no murmurs, rubs, or gallops,no edema  Respiratory:  clear to auscultation bilaterally, normal work of breathing; not on the scar is well-healed GI: soft, nontender, nondistended, + BS MS: no deformity or atrophy Skin: warm and dry, no rash Neuro:  Alert and Oriented x 3, Strength and sensation are intact Psych: euthymic mood, full affect  Wt Readings from Last 3  Encounters:  04/13/16 86.093 kg (189 lb 12.8 oz)  04/04/16 83.6 kg (184 lb 4.9 oz)  03/13/16 82.555 kg (182 lb)      Studies/Labs Reviewed:   EKG:  EKG is not ordered today.   Recent Labs: 01/31/2016: TSH 2.55 02/03/2016: Magnesium 2.2 02/07/2016: ALT 22 02/08/2016: BUN 15; Creatinine, Ser 0.84; Hemoglobin 11.2*; Platelets 275; Potassium 3.7; Sodium 136   Lipid Panel    Component Value Date/Time   CHOL 181 04/12/2015 0623   TRIG 126 04/12/2015 0623   HDL 26* 04/12/2015 0623   CHOLHDL  7.0 04/12/2015 0623   VLDL 25 04/12/2015 0623   LDLCALC 130* 04/12/2015 [id number]    Additional studies/ records that were reviewed today include:  Notes from his visit with Dr. Servando Snare   ASSESSMENT:    1. Coronary artery disease involving native coronary artery of native heart without angina pectoris   2. S/P CABG x 4 02/02/16   3. Aortic atherosclerosis ([place])   4. History of CVA (cerebrovascular accident)   5. History of Rt CA endarterectomy   6. Dyslipidemia   7. Essential hypertension   8. Medication management      PLAN:  In order of problems listed above:  1. CAD: Currently asymptomatic. On aspirin, statin, beta blocker 2. S/P CABG: Now engaged in cardiac rehabilitation, recovering well 3. Ao atherosclerosis 4. Hx CVA: No sequelae from previous right middle cerebral artery stroke 5. S/P R CEA: Followed by Dr. Oneida Alar 6. [id number] to reevaluate lipids on statin therapy 7. HTN: Blood pressure with excellent control    Medication Adjustments/Labs and Tests Ordered: Current medicines are reviewed at length with the patient today.  Concerns regarding medicines are outlined above.  Medication changes, Labs and Tests ordered today are listed in the Patient Instructions below. Patient Instructions  Medication Instructions: Dr Sallyanne Kuster recommends that you continue on your current medications as directed. Please refer to the Current Medication list given to you today.  Labwork: Your  physician recommends that you return for lab work in 29 month.  Testing/Procedures: NONE ORDERED  Follow-up: Dr Sallyanne Kuster recommends that you schedule a follow-up appointment in 10 months (MAY 2018). You will receive a reminder letter in the mail two months in advance. If you don't receive a letter, please call our office to schedule the follow-up appointment.  If you need a refill on your cardiac medications before your next appointment, please call your pharmacy.     Signed, Sanda Klein, MD  04/14/2016 6:07 PM    [address], [place], [place]  [address] Phone: [phone]; Fax: [phone]

## 2016-04-13 NOTE — Progress Notes (Signed)
Cardiac Individual Treatment Plan  Patient Details  Name: Trevor Ward MRN: [id number] Date of Birth: [birth year] Referring Provider:        CARDIAC REHAB PHASE II ORIENTATION from 04/04/2016 in [place]   Referring Provider  Croitoru, Dani Gobble, MD      Initial Encounter Date:       [place] from 04/04/2016 in [facility]   Date  04/04/16   Referring Provider  Croitoru, Mihai, MD      Visit Diagnosis: S/P CABG x 4  Patient's Home Medications on Admission:  Current outpatient prescriptions:  .  allopurinol (ZYLOPRIM) 100 MG tablet, Take 100 mg by mouth daily. , Disp: , Rfl:  .  amitriptyline (ELAVIL) 10 MG tablet, Take 10 mg by mouth at bedtime. , Disp: , Rfl:  .  aspirin EC 81 MG EC tablet, Take 1 tablet (81 mg total) by mouth daily., Disp: , Rfl:  .  esomeprazole (NEXIUM) 40 MG capsule, Take 40 mg by mouth daily. , Disp: , Rfl:  .  Glucosamine Sulfate (GLUCOSAMINE RELIEF) 1000 MG TABS, Take 2,000 mg by mouth daily., Disp: , Rfl:  .  metoprolol tartrate (LOPRESSOR) 25 MG tablet, Take 1 tablet (25 mg total) by mouth 2 (two) times daily. (Patient taking differently: Take 25 mg by mouth daily. Daughter reports pt takes twice a day), Disp: 60 tablet, Rfl: 3 .  rosuvastatin (CRESTOR) 5 MG tablet, Take 1 tablet (5 mg total) by mouth daily., Disp: 30 tablet, Rfl: 3 .  traMADol (ULTRAM) 50 MG tablet, Take 1 tablet (50 mg total) by mouth every 4 (four) hours as needed for moderate pain., Disp: 30 tablet, Rfl: 0 .  vitamin B-12 (CYANOCOBALAMIN) 500 MCG tablet, Take 500 mcg by mouth daily., Disp: , Rfl:  .  colchicine 0.6 MG tablet, , Disp: , Rfl:   Past Medical History: Past Medical History  Diagnosis Date  . Hypertension   . Peripheral vascular disease ([place])     carotid artery stenosis  . History of hiatal hernia   . Depression   . GERD (gastroesophageal reflux disease)   . History of gout   .  Stroke [facility]) 2016    denies residual on 02/01/2016  . High cholesterol     "can't take RX; made my legs weak" (02/01/2016)  . Skin cancer of face      "the good kind"  . Arthritis     "fingers" (02/01/2016)    Tobacco Use: History  Smoking status  . Former Smoker -- 1.5 years  . Types: Cigarettes  . Start date: 01/28/1956  Smokeless tobacco  . Never Used    Comment: "quit smoking cigarettes when I was 75 yr old"    Labs: Recent Review Flowsheet Data    Labs for ITP Cardiac and Pulmonary Rehab Latest Ref Rng 02/02/2016 02/02/2016 02/02/2016 02/02/2016 02/03/2016   PHART 7.350 - 7.450 7.435 7.326(L) 7.324(L) - -   PCO2ART 35.0 - 45.0 mmHg 36.9 40.2 42.8 - -   HCO3 20.0 - 24.0 mEq/L 24.9(H) 20.9 22.1 - -   TCO2 0 - 100 mmol/L 26 22 23 22 23    ACIDBASEDEF 0.0 - 2.0 mmol/L - 5.0(H) 4.0(H) - -   O2SAT - 98.0 93.0 91.0 - -      Capillary Blood Glucose: Lab Results  Component Value Date   GLUCAP 128* 02/05/2016   GLUCAP 97 02/04/2016   GLUCAP 91 02/04/2016  GLUCAP 117* 02/04/2016   GLUCAP 121* 02/04/2016     Exercise Target Goals:    Exercise Program Goal: Individual exercise prescription set with THRR, safety & activity barriers. Participant demonstrates ability to understand and report RPE using BORG scale, to self-measure pulse accurately, and to acknowledge the importance of the exercise prescription.  Exercise Prescription Goal: Starting with aerobic activity 30 plus minutes a day, 3 days per week for initial exercise prescription. Provide home exercise prescription and guidelines that participant acknowledges understanding prior to discharge.  Activity Barriers & Risk Stratification:     Activity Barriers & Cardiac Risk Stratification - 04/06/16 1258    Activity Barriers & Cardiac Risk Stratification   Activity Barriers History of Falls      6 Minute Walk:     6 Minute Walk      04/04/16 1706       6 Minute Walk   Phase Initial     Distance 1495 feet      Walk Time 6 minutes     # of Rest Breaks 0     MPH 2.83     METS 2.77     RPE 11     VO2 Peak 9.71     Symptoms No     Resting HR 72 bpm     Resting BP 108/60 mmHg     Max Ex. HR 92 bpm     Max Ex. BP 120/70 mmHg     2 Minute Post BP 112/70 mmHg        Initial Exercise Prescription:     Initial Exercise Prescription - 04/04/16 1700    Date of Initial Exercise RX and Referring Provider   Date 04/04/16   Referring Provider Croitoru, Dani Gobble, MD   Bike   Level 0.8   Minutes 10   METs 2.84   NuStep   Level 2   Minutes 10   METs 2.2   Track   Laps 8   Minutes 10   METs 2.39   Prescription Details   Frequency (times per week) 3   Duration Progress to 30 minutes of continuous aerobic without signs/symptoms of physical distress   Intensity   THRR 40-80% of Max Heartrate 57-114   Ratings of Perceived Exertion 11-13   Perceived Dyspnea 0-4      Perform Capillary Blood Glucose checks as needed.  Exercise Prescription Changes:   Exercise Comments:   Discharge Exercise Prescription (Final Exercise Prescription Changes):   Nutrition:  Target Goals: Understanding of nutrition guidelines, daily intake of sodium 1500mg , cholesterol 200mg , calories 30% from fat and 7% or less from saturated fats, daily to have 5 or more servings of fruits and vegetables.  Biometrics:     Pre Biometrics - 04/04/16 1709    Pre Biometrics   Waist Circumference 38.5 inches   Hip Circumference 40 inches   Waist to Hip Ratio 0.96 %   Triceps Skinfold 10 mm   % Body Fat 25.1 %   Grip Strength 38 kg   Flexibility 11 in   Single Leg Stand 5.84 seconds       Nutrition Therapy Plan and Nutrition Goals:     Nutrition Therapy & Goals - 04/05/16 1524    Nutrition Therapy   Diet Therapeutic Lifestyle Changes   Personal Nutrition Goals   Personal Goal #1 Pt to have a basic understanding of a diabetic diet given A1c of 6.4   Intervention Plan   Intervention Prescribe, educate and  counsel  regarding individualized specific dietary modifications aiming towards targeted core components such as weight, hypertension, lipid management, diabetes, heart failure and other comorbidities.   Expected Outcomes Short Term Goal: Understand basic principles of dietary content, such as calories, fat, sodium, cholesterol and nutrients.;Long Term Goal: Adherence to prescribed nutrition plan.      Nutrition Discharge: Nutrition Scores:   Nutrition Goals Re-Evaluation:   Psychosocial: Target Goals: Acknowledge presence or absence of depression, maximize coping skills, provide positive support system. Participant is able to verbalize types and ability to use techniques and skills needed for reducing stress and depression.  Initial Review & Psychosocial Screening:     Initial Psych Review & Screening - 04/06/16 [place]? Yes   Barriers   Psychosocial barriers to participate in program The patient should benefit from training in stress management and relaxation.   Screening Interventions   Interventions Encouraged to exercise      Quality of Life Scores:   PHQ-9:     Recent Review Flowsheet Data    Depression screen PHQ 2/9 04/12/2016   Decreased Interest 0   Down, Depressed, Hopeless 1   PHQ - 2 Score 1      Psychosocial Evaluation and Intervention:   Psychosocial Re-Evaluation:   Vocational Rehabilitation: Provide vocational rehab assistance to qualifying candidates.   Vocational Rehab Evaluation & Intervention:     Vocational Rehab - 04/06/16 1301    Initial Vocational Rehab Evaluation & Intervention   Assessment shows need for Vocational Rehabilitation No      Education: Education Goals: Education classes will be provided on a weekly basis, covering required topics. Participant will state understanding/return demonstration of topics presented.  Learning Barriers/Preferences:     Learning Barriers/Preferences -  04/06/16 1259    Learning Barriers/Preferences   Learning Barriers Hearing  memory deficits   Learning Preferences Pictoral;Audio  Patient completed the 7th grade      Education Topics: Count Your Pulse:  -Group instruction provided by verbal instruction, demonstration, patient participation and written materials to support subject.  Instructors address importance of being able to find your pulse and how to count your pulse when at home without a heart monitor.  Patients get hands on experience counting their pulse with staff help and individually.   Heart Attack, Angina, and Risk Factor Modification:  -Group instruction provided by verbal instruction, video, and written materials to support subject.  Instructors address signs and symptoms of angina and heart attacks.    Also discuss risk factors for heart disease and how to make changes to improve heart health risk factors.   Functional Fitness:  -Group instruction provided by verbal instruction, demonstration, patient participation, and written materials to support subject.  Instructors address safety measures for doing things around the house.  Discuss how to get up and down off the floor, how to pick things up properly, how to safely get out of a chair without assistance, and balance training.   Meditation and Mindfulness:  -Group instruction provided by verbal instruction, patient participation, and written materials to support subject.  Instructor addresses importance of mindfulness and meditation practice to help reduce stress and improve awareness.  Instructor also leads participants through a meditation exercise.    Stretching for Flexibility and Mobility:  -Group instruction provided by verbal instruction, patient participation, and written materials to support subject.  Instructors lead participants through series of stretches that are designed to increase flexibility thus improving mobility.  These stretches are  additional  exercise for major muscle groups that are typically performed during regular warm up and cool down.   Hands Only CPR Anytime:  -Group instruction provided by verbal instruction, video, patient participation and written materials to support subject.  Instructors co-teach with AHA video for hands only CPR.  Participants get hands on experience with mannequins.   Nutrition I class: Heart Healthy Eating:  -Group instruction provided by PowerPoint slides, verbal discussion, and written materials to support subject matter. The instructor gives an explanation and review of the Therapeutic Lifestyle Changes diet recommendations, which includes a discussion on lipid goals, dietary fat, sodium, fiber, plant stanol/sterol esters, sugar, and the components of a well-balanced, healthy diet.   Nutrition II class: Lifestyle Skills:  -Group instruction provided by PowerPoint slides, verbal discussion, and written materials to support subject matter. The instructor gives an explanation and review of label reading, grocery shopping for heart health, heart healthy recipe modifications, and ways to make healthier choices when eating out.   Diabetes Question & Answer:  -Group instruction provided by PowerPoint slides, verbal discussion, and written materials to support subject matter. The instructor gives an explanation and review of diabetes co-morbidities, pre- and post-prandial blood glucose goals, pre-exercise blood glucose goals, signs, symptoms, and treatment of hypoglycemia and hyperglycemia, and foot care basics.   Diabetes Blitz:  -Group instruction provided by PowerPoint slides, verbal discussion, and written materials to support subject matter. The instructor gives an explanation and review of the physiology behind type 1 and type 2 diabetes, diabetes medications and rational behind using different medications, pre- and post-prandial blood glucose recommendations and Hemoglobin A1c goals, diabetes diet,  and exercise including blood glucose guidelines for exercising safely.    Portion Distortion:  -Group instruction provided by PowerPoint slides, verbal discussion, written materials, and food models to support subject matter. The instructor gives an explanation of serving size versus portion size, changes in portions sizes over the last 20 years, and what consists of a serving from each food group.   Stress Management:  -Group instruction provided by verbal instruction, video, and written materials to support subject matter.  Instructors review role of stress in heart disease and how to cope with stress positively.     Exercising on Your Own:  -Group instruction provided by verbal instruction, power point, and written materials to support subject.  Instructors discuss benefits of exercise, components of exercise, frequency and intensity of exercise, and end points for exercise.  Also discuss use of nitroglycerin and activating EMS.  Review options of places to exercise outside of rehab.  Review guidelines for sex with heart disease.   Cardiac Drugs I:  -Group instruction provided by verbal instruction and written materials to support subject.  Instructor reviews cardiac drug classes: antiplatelets, anticoagulants, beta blockers, and statins.  Instructor discusses reasons, side effects, and lifestyle considerations for each drug class.   Cardiac Drugs II:  -Group instruction provided by verbal instruction and written materials to support subject.  Instructor reviews cardiac drug classes: angiotensin converting enzyme inhibitors (ACE-I), angiotensin II receptor blockers (ARBs), nitrates, and calcium channel blockers.  Instructor discusses reasons, side effects, and lifestyle considerations for each drug class.   Anatomy and Physiology of the Circulatory System:  -Group instruction provided by verbal instruction, video, and written materials to support subject.  Reviews functional anatomy of  heart, how it relates to various diagnoses, and what role the heart plays in the overall system.   Knowledge Questionnaire Score:     Knowledge Questionnaire  Score - 04/04/16 1711    Knowledge Questionnaire Score   Pre Score 19/24      Core Components/Risk Factors/Patient Goals at Admission:     Personal Goals and Risk Factors at Admission - 04/07/16 0748    Core Components/Risk Factors/Patient Goals on Admission   Increase Strength and Stamina Yes   Intervention Provide advice, education, support and counseling about physical activity/exercise needs.;Develop an individualized exercise prescription for aerobic and resistive training based on initial evaluation findings, risk stratification, comorbidities and participant's personal goals.   Expected Outcomes Achievement of increased cardiorespiratory fitness and enhanced flexibility, muscular endurance and strength shown through measurements of functional capacity and personal statement of participant.   Personal Goal Other Yes   Personal Goal Patient would like to be able to "run with his dogs". Get back to singing. Increase energy level.   Intervention Provide individualized exercise prescription including aerobic exercise 3-5 days per week and resistance training 2 days per week to improve cardiorespiratory fitness and functional capacity.   Expected Outcomes Participate in leisure activities (ie singing, running with his dogs) with sufficient energy.      Core Components/Risk Factors/Patient Goals Review:    Core Components/Risk Factors/Patient Goals at Discharge (Final Review):    ITP Comments:     ITP Comments      04/04/16 1341           ITP Comments Medical Director- Dr. Fransico Him, MD          Comments:  Pt is making expected progress toward personal goals after completing 2 sessions.Pt with no psychosocial needs.  No further intervention warranted at this time. Recommend continued exercise and life style  modification education including  stress management and relaxation techniques to decrease cardiac risk profile. Cherre Huger, BSN

## 2016-04-13 NOTE — Patient Instructions (Signed)
Medication Instructions: Dr Sallyanne Kuster recommends that you continue on your current medications as directed. Please refer to the Current Medication list given to you today.  Labwork: Your physician recommends that you return for lab work in 47 month.  Testing/Procedures: NONE ORDERED  Follow-up: Dr Sallyanne Kuster recommends that you schedule a follow-up appointment in 10 months (MAY 2018). You will receive a reminder letter in the mail two months in advance. If you don't receive a letter, please call our office to schedule the follow-up appointment.  If you need a refill on your cardiac medications before your next appointment, please call your pharmacy.

## 2016-04-14 ENCOUNTER — Encounter (HOSPITAL_COMMUNITY)
Admission: RE | Admit: 2016-04-14 | Discharge: 2016-04-14 | Disposition: A | Discharge status: Home / Self Care | Payer: Medicare Other | Source: Ambulatory Visit | Attending: Cardiovascular Disease | Admitting: Cardiovascular Disease

## 2016-04-14 DIAGNOSIS — Z951 Presence of aortocoronary bypass graft: Secondary | ICD-10-CM | POA: Diagnosis not present

## 2016-04-14 DIAGNOSIS — I25118 Atherosclerotic heart disease of native coronary artery with other forms of angina pectoris: Secondary | ICD-10-CM | POA: Insufficient documentation

## 2016-04-14 LAB — GLUCOSE, CAPILLARY: GLUCOSE-CAPILLARY: 103 mg/dL — AB (ref 65–99)

## 2016-04-17 ENCOUNTER — Encounter (HOSPITAL_COMMUNITY)
Admission: RE | Admit: 2016-04-17 | Discharge: 2016-04-17 | Disposition: A | Discharge status: Home / Self Care | Payer: Medicare Other | Source: Ambulatory Visit | Attending: Cardiovascular Disease | Admitting: Cardiovascular Disease

## 2016-04-17 DIAGNOSIS — Z951 Presence of aortocoronary bypass graft: Secondary | ICD-10-CM

## 2016-04-19 ENCOUNTER — Encounter (HOSPITAL_COMMUNITY)
Admission: RE | Admit: 2016-04-19 | Discharge: 2016-04-19 | Disposition: A | Discharge status: Home / Self Care | Payer: Medicare Other | Source: Ambulatory Visit | Attending: Cardiovascular Disease | Admitting: Cardiovascular Disease

## 2016-04-19 DIAGNOSIS — Z951 Presence of aortocoronary bypass graft: Secondary | ICD-10-CM

## 2016-04-21 ENCOUNTER — Encounter (HOSPITAL_COMMUNITY)
Admission: RE | Admit: 2016-04-21 | Discharge: 2016-04-21 | Disposition: A | Discharge status: Home / Self Care | Payer: Medicare Other | Source: Ambulatory Visit | Attending: Cardiovascular Disease | Admitting: Cardiovascular Disease

## 2016-04-21 DIAGNOSIS — Z951 Presence of aortocoronary bypass graft: Secondary | ICD-10-CM

## 2016-04-24 ENCOUNTER — Encounter (HOSPITAL_COMMUNITY)
Admission: RE | Admit: 2016-04-24 | Discharge: 2016-04-24 | Disposition: A | Discharge status: Home / Self Care | Payer: Medicare Other | Source: Ambulatory Visit | Attending: Cardiovascular Disease | Admitting: Cardiovascular Disease

## 2016-04-24 DIAGNOSIS — Z951 Presence of aortocoronary bypass graft: Secondary | ICD-10-CM

## 2016-04-26 ENCOUNTER — Encounter (HOSPITAL_COMMUNITY)
Admission: RE | Admit: 2016-04-26 | Discharge: 2016-04-26 | Disposition: A | Discharge status: Home / Self Care | Payer: Medicare Other | Source: Ambulatory Visit | Attending: Cardiovascular Disease | Admitting: Cardiovascular Disease

## 2016-04-26 DIAGNOSIS — Z951 Presence of aortocoronary bypass graft: Secondary | ICD-10-CM

## 2016-04-28 ENCOUNTER — Encounter (HOSPITAL_COMMUNITY): Payer: Medicare Other

## 2016-05-01 ENCOUNTER — Encounter (HOSPITAL_COMMUNITY)
Admission: RE | Admit: 2016-05-01 | Discharge: 2016-05-01 | Disposition: A | Discharge status: Home / Self Care | Payer: Medicare Other | Source: Ambulatory Visit | Attending: Cardiovascular Disease | Admitting: Cardiovascular Disease

## 2016-05-01 DIAGNOSIS — Z951 Presence of aortocoronary bypass graft: Secondary | ICD-10-CM

## 2016-05-01 NOTE — Progress Notes (Signed)
Bastion A Bar 75 y.o. male Nutrition Note Spoke with pt and pt's wife. Nutrition Plan and Nutrition Survey goals reviewed with pt. Pt is following Step 2 of the Therapeutic Lifestyle Changes diet. Pt's wt is down 16 lb since pt's surgery due to decreased appetite. Pt states his appetite "is back now." According to pt's A1c, pt is pre-diabetic. Pt and pt's wife were unaware of pre-diabetes. Pre-diabetes discussed. Pt expressed understanding of the information reviewed. Pt aware of nutrition education classes offered. Pt is not planning on attending nutrition classes due to being [facility].  Lab Results  Component Value Date   HGBA1C 6.4 (H) 02/02/2016    Vitals - 1 value per visit 04/13/2016 04/04/2016 03/13/2016 02/28/2016 02/16/2016  Weight (lb) 189.8 184.3 182 182.5    Vitals - 1 value per visit 02/10/2016 02/01/2016 01/28/2016  Weight (lb) 194.3  199.13    Nutrition Diagnosis ? Food-and nutrition-related knowledge deficit related to lack of exposure to information as related to diagnosis of: ? CVD ? Pre-DM  Nutrition Intervention ? Pt's individual nutrition plan reviewed with pt. ? Benefits of adopting Therapeutic Lifestyle Changes discussed when Medficts reviewed. ? Pt to attend the Portion Distortion class   ? Pt given handouts for: ? Nutrition I class ? Nutrition II class ? pre-diabetes ? Continue client-centered nutrition education by RD, as part of interdisciplinary care. Goal(s) ? Pt to identify and limit food sources of saturated fat, trans fat, and sodium ? Pt able to name foods that affect blood glucose  Monitor and Evaluate progress toward nutrition goal with team. Derek Mound, M.Ed, RD, LDN, CDE 05/01/2016 12:16 PM

## 2016-05-03 ENCOUNTER — Encounter (HOSPITAL_COMMUNITY)
Admission: RE | Admit: 2016-05-03 | Discharge: 2016-05-03 | Disposition: A | Discharge status: Home / Self Care | Payer: Medicare Other | Source: Ambulatory Visit | Attending: Cardiovascular Disease | Admitting: Cardiovascular Disease

## 2016-05-03 DIAGNOSIS — Z951 Presence of aortocoronary bypass graft: Secondary | ICD-10-CM | POA: Diagnosis not present

## 2016-05-05 ENCOUNTER — Encounter (HOSPITAL_COMMUNITY): Payer: Medicare Other

## 2016-05-08 ENCOUNTER — Encounter (HOSPITAL_COMMUNITY)
Admission: RE | Admit: 2016-05-08 | Discharge: 2016-05-08 | Disposition: A | Discharge status: Home / Self Care | Payer: Medicare Other | Source: Ambulatory Visit | Attending: Cardiovascular Disease | Admitting: Cardiovascular Disease

## 2016-05-08 DIAGNOSIS — Z951 Presence of aortocoronary bypass graft: Secondary | ICD-10-CM | POA: Diagnosis not present

## 2016-05-08 NOTE — Progress Notes (Signed)
Reviewed home exercise guidelines with patient and patient's wife including endpoints, temperature precautions, target heart rate and rate of perceived exertion. Pt plans to walk and use his stationary bike at home as his mode of home exercise. Pt has orthopedic limitations/ knee pain, and tolerates the bike better than walking. Pt voices understanding of instructions given. Sol Passer, MS, ACSM CCEP

## 2016-05-10 ENCOUNTER — Encounter (HOSPITAL_COMMUNITY)
Admission: RE | Admit: 2016-05-10 | Discharge: 2016-05-10 | Disposition: A | Discharge status: Home / Self Care | Payer: Medicare Other | Source: Ambulatory Visit | Attending: Cardiovascular Disease | Admitting: Cardiovascular Disease

## 2016-05-10 DIAGNOSIS — Z951 Presence of aortocoronary bypass graft: Secondary | ICD-10-CM

## 2016-05-11 NOTE — Progress Notes (Signed)
Cardiac Individual Treatment Plan  Patient Details  Name: Trevor Ward MRN: [id number] Date of Birth: Jan 23, 1941 Referring Provider:   Flowsheet Row CARDIAC REHAB PHASE II ORIENTATION from 04/04/2016 in [place]  Referring Provider  Croitoru, Dani Gobble, MD      Initial Encounter Date:  [place] from 04/04/2016 in [place]  Date  04/04/16  Referring Provider  Croitoru, Mihai, MD      Visit Diagnosis: S/P CABG x 4  Patient's Home Medications on Admission:  Current Outpatient Prescriptions:  .  allopurinol (ZYLOPRIM) 100 MG tablet, Take 100 mg by mouth daily. , Disp: , Rfl:  .  amitriptyline (ELAVIL) 10 MG tablet, Take 10 mg by mouth at bedtime. , Disp: , Rfl:  .  aspirin EC 81 MG EC tablet, Take 1 tablet (81 mg total) by mouth daily., Disp: , Rfl:  .  colchicine 0.6 MG tablet, , Disp: , Rfl:  .  esomeprazole (NEXIUM) 40 MG capsule, Take 40 mg by mouth daily. , Disp: , Rfl:  .  Glucosamine Sulfate (GLUCOSAMINE RELIEF) 1000 MG TABS, Take 2,000 mg by mouth daily., Disp: , Rfl:  .  metoprolol tartrate (LOPRESSOR) 25 MG tablet, Take 1 tablet (25 mg total) by mouth 2 (two) times daily. (Patient taking differently: Take 25 mg by mouth daily. Daughter reports pt takes twice a day), Disp: 60 tablet, Rfl: 3 .  rosuvastatin (CRESTOR) 5 MG tablet, Take 1 tablet (5 mg total) by mouth daily., Disp: 30 tablet, Rfl: 3 .  traMADol (ULTRAM) 50 MG tablet, Take 1 tablet (50 mg total) by mouth every 4 (four) hours as needed for moderate pain., Disp: 30 tablet, Rfl: 0 .  vitamin B-12 (CYANOCOBALAMIN) 500 MCG tablet, Take 500 mcg by mouth daily., Disp: , Rfl:   Past Medical History: Past Medical History:  Diagnosis Date  . Arthritis    "fingers" (02/01/2016)  . Depression   . GERD (gastroesophageal reflux disease)   . High cholesterol    "can't take RX; made my legs weak" (02/01/2016)  . History of  gout   . History of hiatal hernia   . Hypertension   . Peripheral vascular disease ([place])    carotid artery stenosis  . Skin cancer of face     "the good kind"  . Stroke [facility]) 2016   denies residual on 02/01/2016    Tobacco Use: History  Smoking Status  . Former Smoker  . Years: 1.50  . Types: Cigarettes  . Start date: 01/28/1956  Smokeless Tobacco  . Never Used    Comment: "quit smoking cigarettes when I was 75 yr old"    Labs: Recent Review Flowsheet Data    Labs for ITP Cardiac and Pulmonary Rehab Latest Ref Rng & Units 02/02/2016 02/02/2016 02/02/2016 02/02/2016 02/03/2016   Cholestrol 0 - 200 mg/dL - - - - -   LDLCALC 0 - 99 mg/dL - - - - -   HDL >40 mg/dL - - - - -   Trlycerides <150 mg/dL - - - - -   Hemoglobin A1c 4.8 - 5.6 % - - - - -   PHART 7.350 - 7.450 7.435 7.326(L) 7.324(L) - -   PCO2ART 35.0 - 45.0 mmHg 36.9 40.2 42.8 - -   HCO3 20.0 - 24.0 mEq/L 24.9(H) 20.9 22.1 - -   TCO2 0 - 100 mmol/L 26 22 23 22 23    ACIDBASEDEF 0.0 - 2.0  mmol/L - 5.0(H) 4.0(H) - -   O2SAT % 98.0 93.0 91.0 - -      Capillary Blood Glucose: Lab Results  Component Value Date   GLUCAP 103 (H) 04/14/2016   GLUCAP 128 (H) 02/05/2016   GLUCAP 97 02/04/2016   GLUCAP 91 02/04/2016   GLUCAP 117 (H) 02/04/2016     Exercise Target Goals:    Exercise Program Goal: Individual exercise prescription set with THRR, safety & activity barriers. Participant demonstrates ability to understand and report RPE using BORG scale, to self-measure pulse accurately, and to acknowledge the importance of the exercise prescription.  Exercise Prescription Goal: Starting with aerobic activity 30 plus minutes a day, 3 days per week for initial exercise prescription. Provide home exercise prescription and guidelines that participant acknowledges understanding prior to discharge.  Activity Barriers & Risk Stratification:     Activity Barriers & Cardiac Risk Stratification - 04/06/16 1258      Activity  Barriers & Cardiac Risk Stratification   Activity Barriers History of Falls      6 Minute Walk:     6 Minute Walk    Row Name 04/04/16 1706         6 Minute Walk   Phase Initial     Distance 1495 feet     Walk Time 6 minutes     # of Rest Breaks 0     MPH 2.83     METS 2.77     RPE 11     VO2 Peak 9.71     Symptoms No     Resting HR 72 bpm     Resting BP 108/60     Max Ex. HR 92 bpm     Max Ex. BP 120/70     2 Minute Post BP 112/70        Initial Exercise Prescription:     Initial Exercise Prescription - 04/04/16 1700      Date of Initial Exercise RX and Referring Provider   Date 04/04/16   Referring Provider Croitoru, Dani Gobble, MD     Bike   Level 0.8   Minutes 10   METs 2.84     NuStep   Level 2   Minutes 10   METs 2.2     Track   Laps 8   Minutes 10   METs 2.39     Prescription Details   Frequency (times per week) 3   Duration Progress to 30 minutes of continuous aerobic without signs/symptoms of physical distress     Intensity   THRR 40-80% of Max Heartrate 57-114   Ratings of Perceived Exertion 11-13   Perceived Dyspnea 0-4      Perform Capillary Blood Glucose checks as needed.  Exercise Prescription Changes:     Exercise Prescription Changes    Row Name 04/26/16 1400 05/10/16 1400           Exercise Review   Progression Yes Yes        Response to Exercise   Blood Pressure (Admit) 128/82 142/82      Blood Pressure (Exercise) 154/90 140/80      Blood Pressure (Exit) 110/80 124/62      Heart Rate (Admit) 84 bpm 70 bpm      Heart Rate (Exercise) 110 bpm 119 bpm      Heart Rate (Exit) 94 bpm 75 bpm      Rating of Perceived Exertion (Exercise) 13 12      Symptoms none none  Comments  - Reviewed home exercise on 05/08/16.      Duration Progress to 30 minutes of continuous aerobic without signs/symptoms of physical distress Progress to 30 minutes of continuous aerobic without signs/symptoms of physical distress      Intensity  THRR unchanged THRR unchanged        Progression   Progression Continue to progress workloads to maintain intensity without signs/symptoms of physical distress. Continue to progress workloads to maintain intensity without signs/symptoms of physical distress.      Average METs 2.9 3        Resistance Training   Training Prescription Yes Yes      Weight 3lbs 3lbs      Reps 10-12 10-12        Interval Training   Interval Training No No        Bike   Level 1.2 1.2      Minutes 10 10      METs 3.6 3.58        NuStep   Level 1 1      Minutes 10 10      METs 2.4 2.2        Track   Laps 16 13      Minutes 10 10      METs 3.79 3.26        Home Exercise Plan   Plans to continue exercise at  - Home      Frequency  - Add 2 additional days to program exercise sessions.         Exercise Comments:     Exercise Comments    Row Name 04/26/16 1447 05/08/16 1655         Exercise Comments Patient off to a good start with exercise. Reviewed home exercise guidelines with patient and pt's wife.         Discharge Exercise Prescription (Final Exercise Prescription Changes):     Exercise Prescription Changes - 05/10/16 1400      Exercise Review   Progression Yes     Response to Exercise   Blood Pressure (Admit) 142/82   Blood Pressure (Exercise) 140/80   Blood Pressure (Exit) 124/62   Heart Rate (Admit) 70 bpm   Heart Rate (Exercise) 119 bpm   Heart Rate (Exit) 75 bpm   Rating of Perceived Exertion (Exercise) 12   Symptoms none   Comments Reviewed home exercise on 05/08/16.   Duration Progress to 30 minutes of continuous aerobic without signs/symptoms of physical distress   Intensity THRR unchanged     Progression   Progression Continue to progress workloads to maintain intensity without signs/symptoms of physical distress.   Average METs 3     Resistance Training   Training Prescription Yes   Weight 3lbs   Reps 10-12     Interval Training   Interval Training No      Bike   Level 1.2   Minutes 10   METs 3.58     NuStep   Level 1   Minutes 10   METs 2.2     Track   Laps 13   Minutes 10   METs 3.26     Home Exercise Plan   Plans to continue exercise at Home   Frequency Add 2 additional days to program exercise sessions.      Nutrition:  Target Goals: Understanding of nutrition guidelines, daily intake of sodium 1500mg , cholesterol 200mg , calories 30% from fat and 7% or less from saturated fats,  daily to have 5 or more servings of fruits and vegetables.  Biometrics:     Pre Biometrics - 04/04/16 1709      Pre Biometrics   Waist Circumference 38.5 inches   Hip Circumference 40 inches   Waist to Hip Ratio 0.96 %   Triceps Skinfold 10 mm   % Body Fat 25.1 %   Grip Strength 38 kg   Flexibility 11 in   Single Leg Stand 5.84 seconds       Nutrition Therapy Plan and Nutrition Goals:     Nutrition Therapy & Goals - 05/01/16 1219      Nutrition Therapy   Diet Therapeutic Lifestyle Changes     Personal Nutrition Goals   Personal Goal #1 Pt to maintain wt while in Cardiac Rehab   Personal Goal #2 Pt to understand the basics of a Carb Modified, Diabetic diet     Intervention Plan   Intervention Prescribe, educate and counsel regarding individualized specific dietary modifications aiming towards targeted core components such as weight, hypertension, lipid management, diabetes, heart failure and other comorbidities.;Nutrition handout(s) given to patient.  Pre-diabetes handout given   Expected Outcomes Short Term Goal: Understand basic principles of dietary content, such as calories, fat, sodium, cholesterol and nutrients.;Long Term Goal: Adherence to prescribed nutrition plan.      Nutrition Discharge: Nutrition Scores:     Nutrition Assessments - 05/01/16 1219      MEDFICTS Scores   Pre Score 47      Nutrition Goals Re-Evaluation:   Psychosocial: Target Goals: Acknowledge presence or absence of depression, maximize  coping skills, provide positive support system. Participant is able to verbalize types and ability to use techniques and skills needed for reducing stress and depression.  Initial Review & Psychosocial Screening:     Initial Psych Review & Screening - 04/06/16 [place]? Yes     Barriers   Psychosocial barriers to participate in program The patient should benefit from training in stress management and relaxation.     Screening Interventions   Interventions Encouraged to exercise      Quality of Life Scores:     Quality of Life - 04/14/16 0646      Quality of Life Scores   Health/Function Pre 18.43 %   Socioeconomic Pre 15.43 %   Psych/Spiritual Pre 29.14 %   Family Pre 22.8 %   GLOBAL Pre 20.73 %      PHQ-9: Recent Review Flowsheet Data    Depression screen PHQ 2/9 04/12/2016   Decreased Interest 0   Down, Depressed, Hopeless 1   PHQ - 2 Score 1      Psychosocial Evaluation and Intervention:   Psychosocial Re-Evaluation:   Vocational Rehabilitation: Provide vocational rehab assistance to qualifying candidates.   Vocational Rehab Evaluation & Intervention:     Vocational Rehab - 04/06/16 1301      Initial Vocational Rehab Evaluation & Intervention   Assessment shows need for Vocational Rehabilitation No      Education: Education Goals: Education classes will be provided on a weekly basis, covering required topics. Participant will state understanding/return demonstration of topics presented.  Learning Barriers/Preferences:     Learning Barriers/Preferences - 04/06/16 1259      Learning Barriers/Preferences   Learning Barriers Hearing  memory deficits   Learning Preferences Pictoral;Audio  Patient completed the 7th grade      Education Topics: Count Your Pulse:  -Group instruction provided  by verbal instruction, demonstration, patient participation and written materials to support subject.  Instructors  address importance of being able to find your pulse and how to count your pulse when at home without a heart monitor.  Patients get hands on experience counting their pulse with staff help and individually.   Heart Attack, Angina, and Risk Factor Modification:  -Group instruction provided by verbal instruction, video, and written materials to support subject.  Instructors address signs and symptoms of angina and heart attacks.    Also discuss risk factors for heart disease and how to make changes to improve heart health risk factors.   Functional Fitness:  -Group instruction provided by verbal instruction, demonstration, patient participation, and written materials to support subject.  Instructors address safety measures for doing things around the house.  Discuss how to get up and down off the floor, how to pick things up properly, how to safely get out of a chair without assistance, and balance training. Flowsheet Row CARDIAC REHAB PHASE II EXERCISE from 04/21/2016 in [place]  Date  04/14/16  Instruction Review Code  2- meets goals/outcomes      Meditation and Mindfulness:  -Group instruction provided by verbal instruction, patient participation, and written materials to support subject.  Instructor addresses importance of mindfulness and meditation practice to help reduce stress and improve awareness.  Instructor also leads participants through a meditation exercise.    Stretching for Flexibility and Mobility:  -Group instruction provided by verbal instruction, patient participation, and written materials to support subject.  Instructors lead participants through series of stretches that are designed to increase flexibility thus improving mobility.  These stretches are additional exercise for major muscle groups that are typically performed during regular warm up and cool down. Flowsheet Row CARDIAC REHAB PHASE II EXERCISE from 04/21/2016 in [place]  Date  04/21/16  Instruction Review Code  2- meets goals/outcomes      Hands Only CPR Anytime:  -Group instruction provided by verbal instruction, video, patient participation and written materials to support subject.  Instructors co-teach with AHA video for hands only CPR.  Participants get hands on experience with mannequins.   Nutrition I class: Heart Healthy Eating:  -Group instruction provided by PowerPoint slides, verbal discussion, and written materials to support subject matter. The instructor gives an explanation and review of the Therapeutic Lifestyle Changes diet recommendations, which includes a discussion on lipid goals, dietary fat, sodium, fiber, plant stanol/sterol esters, sugar, and the components of a well-balanced, healthy diet.   Nutrition II class: Lifestyle Skills:  -Group instruction provided by PowerPoint slides, verbal discussion, and written materials to support subject matter. The instructor gives an explanation and review of label reading, grocery shopping for heart health, heart healthy recipe modifications, and ways to make healthier choices when eating out.   Diabetes Question & Answer:  -Group instruction provided by PowerPoint slides, verbal discussion, and written materials to support subject matter. The instructor gives an explanation and review of diabetes co-morbidities, pre- and post-prandial blood glucose goals, pre-exercise blood glucose goals, signs, symptoms, and treatment of hypoglycemia and hyperglycemia, and foot care basics.   Diabetes Blitz:  -Group instruction provided by PowerPoint slides, verbal discussion, and written materials to support subject matter. The instructor gives an explanation and review of the physiology behind type 1 and type 2 diabetes, diabetes medications and rational behind using different medications, pre- and post-prandial blood glucose recommendations and Hemoglobin A1c goals, diabetes  diet,  and exercise including blood glucose guidelines for exercising safely.    Portion Distortion:  -Group instruction provided by PowerPoint slides, verbal discussion, written materials, and food models to support subject matter. The instructor gives an explanation of serving size versus portion size, changes in portions sizes over the last 20 years, and what consists of a serving from each food group.   Stress Management:  -Group instruction provided by verbal instruction, video, and written materials to support subject matter.  Instructors review role of stress in heart disease and how to cope with stress positively.     Exercising on Your Own:  -Group instruction provided by verbal instruction, power point, and written materials to support subject.  Instructors discuss benefits of exercise, components of exercise, frequency and intensity of exercise, and end points for exercise.  Also discuss use of nitroglycerin and activating EMS.  Review options of places to exercise outside of rehab.  Review guidelines for sex with heart disease. Flowsheet Row CARDIAC REHAB PHASE II EXERCISE from 04/21/2016 in [facility]  Date  04/19/16  Instruction Review Code  2- meets goals/outcomes      Cardiac Drugs I:  -Group instruction provided by verbal instruction and written materials to support subject.  Instructor reviews cardiac drug classes: antiplatelets, anticoagulants, beta blockers, and statins.  Instructor discusses reasons, side effects, and lifestyle considerations for each drug class.   Cardiac Drugs II:  -Group instruction provided by verbal instruction and written materials to support subject.  Instructor reviews cardiac drug classes: angiotensin converting enzyme inhibitors (ACE-I), angiotensin II receptor blockers (ARBs), nitrates, and calcium channel blockers.  Instructor discusses reasons, side effects, and lifestyle considerations for each drug  class.   Anatomy and Physiology of the Circulatory System:  -Group instruction provided by verbal instruction, video, and written materials to support subject.  Reviews functional anatomy of heart, how it relates to various diagnoses, and what role the heart plays in the overall system.   Knowledge Questionnaire Score:     Knowledge Questionnaire Score - 04/04/16 1711      Knowledge Questionnaire Score   Pre Score 19/24      Core Components/Risk Factors/Patient Goals at Admission:     Personal Goals and Risk Factors at Admission - 04/07/16 0748      Core Components/Risk Factors/Patient Goals on Admission   Increase Strength and Stamina Yes   Intervention Provide advice, education, support and counseling about physical activity/exercise needs.;Develop an individualized exercise prescription for aerobic and resistive training based on initial evaluation findings, risk stratification, comorbidities and participant's personal goals.   Expected Outcomes Achievement of increased cardiorespiratory fitness and enhanced flexibility, muscular endurance and strength shown through measurements of functional capacity and personal statement of participant.   Personal Goal Other Yes   Personal Goal Patient would like to be able to "run with his dogs". Get back to singing. Increase energy level.   Intervention Provide individualized exercise prescription including aerobic exercise 3-5 days per week and resistance training 2 days per week to improve cardiorespiratory fitness and functional capacity.   Expected Outcomes Participate in leisure activities (ie singing, running with his dogs) with sufficient energy.      Core Components/Risk Factors/Patient Goals Review:      Goals and Risk Factor Review    Row Name 05/08/16 1655             Core Components/Risk Factors/Patient Goals Review   Personal Goals Review Other       Review  Reviewed home exercise guidelines with patient. Pt has bike  at home and plans to walk for his home exercise.        Expected Outcomes Begin home exercise program in addition to cardiac rehab to improve cardiorespiratory fitness, so that pt can have energy and stamina to do desired leisure activities.          Core Components/Risk Factors/Patient Goals at Discharge (Final Review):      Goals and Risk Factor Review - 05/08/16 1655      Core Components/Risk Factors/Patient Goals Review   Personal Goals Review Other   Review Reviewed home exercise guidelines with patient. Pt has bike at home and plans to walk for his home exercise.    Expected Outcomes Begin home exercise program in addition to cardiac rehab to improve cardiorespiratory fitness, so that pt can have energy and stamina to do desired leisure activities.      ITP Comments:     ITP Comments    Row Name 04/04/16 1341           ITP Comments Medical Director- Dr. Fransico Him, MD          Comments:  Pt is making expected progress toward personal goals after completing 11 sessions. Recommend continued exercise and life style modification education including  stress management and relaxation techniques to decrease cardiac risk profile. Psychosocial Assessment remains unchanged.  Pt with supportive family. Pt family very supportive in his rehab by bringing him to exercise three times a week. No psychosocial needs identified, no further interventions warranted at this time. Cherre Huger, BSN

## 2016-05-12 ENCOUNTER — Encounter (HOSPITAL_COMMUNITY)
Admission: RE | Admit: 2016-05-12 | Discharge: 2016-05-12 | Disposition: A | Discharge status: Home / Self Care | Payer: Medicare Other | Source: Ambulatory Visit | Attending: Cardiovascular Disease | Admitting: Cardiovascular Disease

## 2016-05-12 DIAGNOSIS — Z951 Presence of aortocoronary bypass graft: Secondary | ICD-10-CM | POA: Diagnosis not present

## 2016-05-15 ENCOUNTER — Encounter (HOSPITAL_COMMUNITY)
Admission: RE | Admit: 2016-05-15 | Discharge: 2016-05-15 | Disposition: A | Discharge status: Home / Self Care | Payer: Medicare Other | Source: Ambulatory Visit | Attending: Cardiovascular Disease | Admitting: Cardiovascular Disease

## 2016-05-15 DIAGNOSIS — Z951 Presence of aortocoronary bypass graft: Secondary | ICD-10-CM

## 2016-05-16 DIAGNOSIS — Z79899 Other long term (current) drug therapy: Secondary | ICD-10-CM | POA: Diagnosis not present

## 2016-05-16 DIAGNOSIS — E785 Hyperlipidemia, unspecified: Secondary | ICD-10-CM | POA: Diagnosis not present

## 2016-05-16 LAB — COMPREHENSIVE METABOLIC PANEL
ALBUMIN: 3.9 g/dL (ref 3.6–5.1)
ALT: 10 U/L (ref 9–46)
AST: 14 U/L (ref 10–35)
Alkaline Phosphatase: 64 U/L (ref 40–115)
BILIRUBIN TOTAL: 0.5 mg/dL (ref 0.2–1.2)
BUN: 8 mg/dL (ref 7–25)
CALCIUM: 9.1 mg/dL (ref 8.6–10.3)
CHLORIDE: 102 mmol/L (ref 98–110)
CO2: 27 mmol/L (ref 20–31)
CREATININE: 0.85 mg/dL (ref 0.70–1.18)
Glucose, Bld: 113 mg/dL — ABNORMAL HIGH (ref 65–99)
POTASSIUM: 4.7 mmol/L (ref 3.5–5.3)
Sodium: 140 mmol/L (ref 135–146)
Total Protein: 6.8 g/dL (ref 6.1–8.1)

## 2016-05-16 LAB — LIPID PANEL
CHOL/HDL RATIO: 6 ratio — AB (ref <=5.0)
CHOLESTEROL: 137 mg/dL (ref 125–200)
HDL: 23 mg/dL — AB (ref >=40)
LDL Cholesterol: 83 mg/dL (ref <130)
TRIGLYCERIDES: 155 mg/dL — AB (ref <150)
VLDL: 31 mg/dL — AB (ref <30)

## 2016-05-17 ENCOUNTER — Encounter (HOSPITAL_COMMUNITY)
Admission: RE | Admit: 2016-05-17 | Discharge: 2016-05-17 | Disposition: A | Discharge status: Home / Self Care | Payer: Medicare Other | Source: Ambulatory Visit | Attending: Cardiovascular Disease | Admitting: Cardiovascular Disease

## 2016-05-17 DIAGNOSIS — Z951 Presence of aortocoronary bypass graft: Secondary | ICD-10-CM | POA: Diagnosis not present

## 2016-05-19 ENCOUNTER — Encounter (HOSPITAL_COMMUNITY)
Admission: RE | Admit: 2016-05-19 | Discharge: 2016-05-19 | Disposition: A | Discharge status: Home / Self Care | Payer: Medicare Other | Source: Ambulatory Visit | Attending: Cardiovascular Disease | Admitting: Cardiovascular Disease

## 2016-05-19 DIAGNOSIS — Z951 Presence of aortocoronary bypass graft: Secondary | ICD-10-CM

## 2016-05-22 ENCOUNTER — Encounter (HOSPITAL_COMMUNITY): Payer: Medicare Other

## 2016-05-24 ENCOUNTER — Encounter (HOSPITAL_COMMUNITY)
Admission: RE | Admit: 2016-05-24 | Discharge: 2016-05-24 | Disposition: A | Discharge status: Home / Self Care | Payer: Medicare Other | Source: Ambulatory Visit | Attending: Cardiovascular Disease | Admitting: Cardiovascular Disease

## 2016-05-24 DIAGNOSIS — Z951 Presence of aortocoronary bypass graft: Secondary | ICD-10-CM | POA: Diagnosis not present

## 2016-05-26 ENCOUNTER — Encounter (HOSPITAL_COMMUNITY): Admission: RE | Admit: 2016-05-26 | Payer: Medicare Other | Source: Ambulatory Visit

## 2016-05-26 ENCOUNTER — Telehealth: Payer: Self-pay

## 2016-05-26 DIAGNOSIS — E785 Hyperlipidemia, unspecified: Secondary | ICD-10-CM

## 2016-05-26 NOTE — Telephone Encounter (Signed)
Recommendations reviewed with patients wife. Wife verbalized understanding and agreed with plan. Labs ordered, printed, and mailed to patient.

## 2016-05-26 NOTE — Telephone Encounter (Signed)
-----   Message from Sanda Klein, MD sent at 05/19/2016  9:59 AM EDT ----- It could indeed be the rosuvastatin. I would recommend that he come off of it for a month and see if he feels better. Then restart it 10 mg only twice a week. Check cholesterol 3 months later

## 2016-05-31 ENCOUNTER — Encounter (HOSPITAL_COMMUNITY)
Admission: RE | Admit: 2016-05-31 | Discharge: 2016-05-31 | Disposition: A | Discharge status: Home / Self Care | Payer: Medicare Other | Source: Ambulatory Visit | Attending: Cardiovascular Disease | Admitting: Cardiovascular Disease

## 2016-05-31 DIAGNOSIS — Z951 Presence of aortocoronary bypass graft: Secondary | ICD-10-CM

## 2016-06-02 ENCOUNTER — Encounter (HOSPITAL_COMMUNITY)
Admission: RE | Admit: 2016-06-02 | Discharge: 2016-06-02 | Disposition: A | Discharge status: Home / Self Care | Payer: Medicare Other | Source: Ambulatory Visit | Attending: Cardiovascular Disease | Admitting: Cardiovascular Disease

## 2016-06-02 DIAGNOSIS — Z951 Presence of aortocoronary bypass graft: Secondary | ICD-10-CM

## 2016-06-05 ENCOUNTER — Encounter (HOSPITAL_COMMUNITY): Payer: Medicare Other

## 2016-06-07 ENCOUNTER — Encounter (HOSPITAL_COMMUNITY)
Admission: RE | Admit: 2016-06-07 | Discharge: 2016-06-07 | Disposition: A | Discharge status: Home / Self Care | Payer: Medicare Other | Source: Ambulatory Visit | Attending: Surgery | Admitting: Surgery

## 2016-06-07 DIAGNOSIS — Z951 Presence of aortocoronary bypass graft: Secondary | ICD-10-CM | POA: Diagnosis not present

## 2016-06-08 DIAGNOSIS — L570 Actinic keratosis: Secondary | ICD-10-CM | POA: Diagnosis not present

## 2016-06-08 NOTE — Progress Notes (Signed)
Cardiac Individual Treatment Plan  Patient Details  Name: Trevor Ward MRN: [id number] Date of Birth: Dec 05, 1940 Referring Provider:   Flowsheet Row CARDIAC REHAB PHASE II ORIENTATION from 04/04/2016 in [facility]  Referring Provider  Croitoru, Dani Gobble, MD      Initial Encounter Date:  [place] from 04/04/2016 in [facility]  Date  04/04/16  Referring Provider  Croitoru, Mihai, MD      Visit Diagnosis: S/P CABG x 4  Patient's Home Medications on Admission:  Current Outpatient Prescriptions:  .  allopurinol (ZYLOPRIM) 100 MG tablet, Take 100 mg by mouth daily. , Disp: , Rfl:  .  amitriptyline (ELAVIL) 10 MG tablet, Take 10 mg by mouth at bedtime. , Disp: , Rfl:  .  aspirin EC 81 MG EC tablet, Take 1 tablet (81 mg total) by mouth daily., Disp: , Rfl:  .  colchicine 0.6 MG tablet, , Disp: , Rfl:  .  esomeprazole (NEXIUM) 40 MG capsule, Take 40 mg by mouth daily. , Disp: , Rfl:  .  Glucosamine Sulfate (GLUCOSAMINE RELIEF) 1000 MG TABS, Take 2,000 mg by mouth daily., Disp: , Rfl:  .  metoprolol tartrate (LOPRESSOR) 25 MG tablet, Take 1 tablet (25 mg total) by mouth 2 (two) times daily. (Patient taking differently: Take 25 mg by mouth daily. Daughter reports pt takes twice a day), Disp: 60 tablet, Rfl: 3 .  rosuvastatin (CRESTOR) 5 MG tablet, Take 1 tablet (5 mg total) by mouth daily., Disp: 30 tablet, Rfl: 3 .  traMADol (ULTRAM) 50 MG tablet, Take 1 tablet (50 mg total) by mouth every 4 (four) hours as needed for moderate pain., Disp: 30 tablet, Rfl: 0 .  vitamin B-12 (CYANOCOBALAMIN) 500 MCG tablet, Take 500 mcg by mouth daily., Disp: , Rfl:   Past Medical History: Past Medical History:  Diagnosis Date  . Arthritis    "fingers" (02/01/2016)  . Depression   . GERD (gastroesophageal reflux disease)   . High cholesterol    "can't take RX; made my legs weak" (02/01/2016)  . History of  gout   . History of hiatal hernia   . Hypertension   . Peripheral vascular disease ([place])    carotid artery stenosis  . Skin cancer of face     "the good kind"  . Stroke [facility]) 2016   denies residual on 02/01/2016    Tobacco Use: History  Smoking Status  . Former Smoker  . Years: 1.50  . Types: Cigarettes  . Start date: 01/28/1956  Smokeless Tobacco  . Never Used    Comment: "quit smoking cigarettes when I was 75 yr old"    Labs: Recent Review Flowsheet Data    Labs for ITP Cardiac and Pulmonary Rehab Latest Ref Rng & Units 02/02/2016 02/02/2016 02/02/2016 02/03/2016 05/16/2016   Cholestrol 125 - 200 mg/dL - - - - 137   LDLCALC <130 mg/dL - - - - 83   HDL >=40 mg/dL - - - - 23(L)   Trlycerides <150 mg/dL - - - - 155(H)   Hemoglobin A1c 4.8 - 5.6 % - - - - -   PHART 7.350 - 7.450 7.326(L) 7.324(L) - - -   PCO2ART 35.0 - 45.0 mmHg 40.2 42.8 - - -   HCO3 20.0 - 24.0 mEq/L 20.9 22.1 - - -   TCO2 0 - 100 mmol/L 22 23 22 23  -   ACIDBASEDEF 0.0 - 2.0 mmol/L 5.0(H)  4.0(H) - - -   O2SAT % 93.0 91.0 - - -      Capillary Blood Glucose: Lab Results  Component Value Date   GLUCAP 103 (H) 04/14/2016   GLUCAP 128 (H) 02/05/2016   GLUCAP 97 02/04/2016   GLUCAP 91 02/04/2016   GLUCAP 117 (H) 02/04/2016     Exercise Target Goals:    Exercise Program Goal: Individual exercise prescription set with THRR, safety & activity barriers. Participant demonstrates ability to understand and report RPE using BORG scale, to self-measure pulse accurately, and to acknowledge the importance of the exercise prescription.  Exercise Prescription Goal: Starting with aerobic activity 30 plus minutes a day, 3 days per week for initial exercise prescription. Provide home exercise prescription and guidelines that participant acknowledges understanding prior to discharge.  Activity Barriers & Risk Stratification:     Activity Barriers & Cardiac Risk Stratification - 04/06/16 1258      Activity Barriers &  Cardiac Risk Stratification   Activity Barriers History of Falls      6 Minute Walk:     6 Minute Walk    Row Name 04/04/16 1706         6 Minute Walk   Phase Initial     Distance 1495 feet     Walk Time 6 minutes     # of Rest Breaks 0     MPH 2.83     METS 2.77     RPE 11     VO2 Peak 9.71     Symptoms No     Resting HR 72 bpm     Resting BP 108/60     Max Ex. HR 92 bpm     Max Ex. BP 120/70     2 Minute Post BP 112/70        Initial Exercise Prescription:     Initial Exercise Prescription - 04/04/16 1700      Date of Initial Exercise RX and Referring Provider   Date 04/04/16   Referring Provider Croitoru, Dani Gobble, MD     Bike   Level 0.8   Minutes 10   METs 2.84     NuStep   Level 2   Minutes 10   METs 2.2     Track   Laps 8   Minutes 10   METs 2.39     Prescription Details   Frequency (times per week) 3   Duration Progress to 30 minutes of continuous aerobic without signs/symptoms of physical distress     Intensity   THRR 40-80% of Max Heartrate 57-114   Ratings of Perceived Exertion 11-13   Perceived Dyspnea 0-4      Perform Capillary Blood Glucose checks as needed.  Exercise Prescription Changes:      Exercise Prescription Changes    Row Name 04/26/16 1400 05/10/16 1400 06/05/16 0800         Exercise Review   Progression Yes Yes -       Response to Exercise   Blood Pressure (Admit) 128/82 142/82 128/72     Blood Pressure (Exercise) 154/90 140/80 136/70     Blood Pressure (Exit) 110/80 124/62 100/68     Heart Rate (Admit) 84 bpm 70 bpm 82 bpm     Heart Rate (Exercise) 110 bpm 119 bpm 99 bpm     Heart Rate (Exit) 94 bpm 75 bpm 81 bpm     Rating of Perceived Exertion (Exercise) 13 12 11      Symptoms none  none none     Comments  - Reviewed home exercise on 05/08/16. Reviewed home exercise on 05/08/16.     Duration Progress to 30 minutes of continuous aerobic without signs/symptoms of physical distress Progress to 30 minutes of  continuous aerobic without signs/symptoms of physical distress Progress to 30 minutes of continuous aerobic without signs/symptoms of physical distress     Intensity THRR unchanged THRR unchanged THRR unchanged       Progression   Progression Continue to progress workloads to maintain intensity without signs/symptoms of physical distress. Continue to progress workloads to maintain intensity without signs/symptoms of physical distress. Continue to progress workloads to maintain intensity without signs/symptoms of physical distress.     Average METs 2.9 3 2.8       Resistance Training   Training Prescription Yes Yes Yes     Weight 3lbs 3lbs 3lbs     Reps 10-12 10-12 10-12       Interval Training   Interval Training No No No       Bike   Level 1.2 1.2 1.3     Minutes 10 10 10      METs 3.6 3.58 3.77       NuStep   Level 1 1 3      Minutes 10 10 10      METs 2.4 2.2 2.3       Arm Ergometer   Level  -  - 1     Minutes  -  - 10     METs  -  - 2.48       Track   Laps 16 13 -     Minutes 10 10 -     METs 3.79 3.26 -       Home Exercise Plan   Plans to continue exercise at  - Home Home     Frequency  - Add 2 additional days to program exercise sessions. Add 2 additional days to program exercise sessions.        Exercise Comments:      Exercise Comments    Row Name 04/26/16 1447 05/08/16 1655 06/07/16 1650       Exercise Comments Patient off to a good start with exercise. Reviewed home exercise guidelines with patient and pt's wife. Patient is riding his staionary bike 8-10 minutes daily. Encouraged him to increase duration to 15 minutes and will progress from there.        Discharge Exercise Prescription (Final Exercise Prescription Changes):     Exercise Prescription Changes - 06/05/16 0800      Exercise Review   Progression --     Response to Exercise   Blood Pressure (Admit) 128/72   Blood Pressure (Exercise) 136/70   Blood Pressure (Exit) 100/68   Heart Rate  (Admit) 82 bpm   Heart Rate (Exercise) 99 bpm   Heart Rate (Exit) 81 bpm   Rating of Perceived Exertion (Exercise) 11   Symptoms none   Comments Reviewed home exercise on 05/08/16.   Duration Progress to 30 minutes of continuous aerobic without signs/symptoms of physical distress   Intensity THRR unchanged     Progression   Progression Continue to progress workloads to maintain intensity without signs/symptoms of physical distress.   Average METs 2.8     Resistance Training   Training Prescription Yes   Weight 3lbs   Reps 10-12     Interval Training   Interval Training No     Bike   Level 1.3  Minutes 10   METs 3.77     NuStep   Level 3   Minutes 10   METs 2.3     Arm Ergometer   Level 1   Minutes 10   METs 2.48     Track   Laps --   Minutes --   METs --     Home Exercise Plan   Plans to continue exercise at Home   Frequency Add 2 additional days to program exercise sessions.      Nutrition:  Target Goals: Understanding of nutrition guidelines, daily intake of sodium 1500mg , cholesterol 200mg , calories 30% from fat and 7% or less from saturated fats, daily to have 5 or more servings of fruits and vegetables.  Biometrics:     Pre Biometrics - 04/04/16 1709      Pre Biometrics   Waist Circumference 38.5 inches   Hip Circumference 40 inches   Waist to Hip Ratio 0.96 %   Triceps Skinfold 10 mm   % Body Fat 25.1 %   Grip Strength 38 kg   Flexibility 11 in   Single Leg Stand 5.84 seconds       Nutrition Therapy Plan and Nutrition Goals:     Nutrition Therapy & Goals - 05/01/16 1219      Nutrition Therapy   Diet Therapeutic Lifestyle Changes     Personal Nutrition Goals   Personal Goal #1 Pt to maintain wt while in Cardiac Rehab   Personal Goal #2 Pt to understand the basics of a Carb Modified, Diabetic diet     Intervention Plan   Intervention Prescribe, educate and counsel regarding individualized specific dietary modifications aiming  towards targeted core components such as weight, hypertension, lipid management, diabetes, heart failure and other comorbidities.;Nutrition handout(s) given to patient.  Pre-diabetes handout given   Expected Outcomes Short Term Goal: Understand basic principles of dietary content, such as calories, fat, sodium, cholesterol and nutrients.;Long Term Goal: Adherence to prescribed nutrition plan.      Nutrition Discharge: Nutrition Scores:     Nutrition Assessments - 05/01/16 1219      MEDFICTS Scores   Pre Score 47      Nutrition Goals Re-Evaluation:   Psychosocial: Target Goals: Acknowledge presence or absence of depression, maximize coping skills, provide positive support system. Participant is able to verbalize types and ability to use techniques and skills needed for reducing stress and depression.  Initial Review & Psychosocial Screening:     Initial Psych Review & Screening - 04/06/16 [place]? Yes     Barriers   Psychosocial barriers to participate in program The patient should benefit from training in stress management and relaxation.     Screening Interventions   Interventions Encouraged to exercise      Quality of Life Scores:     Quality of Life - 04/14/16 0646      Quality of Life Scores   Health/Function Pre 18.43 %   Socioeconomic Pre 15.43 %   Psych/Spiritual Pre 29.14 %   Family Pre 22.8 %   GLOBAL Pre 20.73 %      PHQ-9: Recent Review Flowsheet Data    Depression screen PHQ 2/9 04/12/2016   Decreased Interest 0   Down, Depressed, Hopeless 1   PHQ - 2 Score 1      Psychosocial Evaluation and Intervention:     Psychosocial Evaluation - 06/08/16 1900      Psychosocial Evaluation &  Interventions   Interventions Encouraged to exercise with the program and follow exercise prescription;Relaxation education;Stress management education   Comments No needs identified, no further intervention warranted       Psychosocial Re-Evaluation:     Psychosocial Re-Evaluation    [place] Name 06/08/16 1900             Psychosocial Re-Evaluation   Interventions Encouraged to attend Cardiac Rehabilitation for the exercise          Vocational Rehabilitation: Provide vocational rehab assistance to qualifying candidates.   Vocational Rehab Evaluation & Intervention:     Vocational Rehab - 04/06/16 1301      Initial Vocational Rehab Evaluation & Intervention   Assessment shows need for Vocational Rehabilitation No      Education: Education Goals: Education classes will be provided on a weekly basis, covering required topics. Participant will state understanding/return demonstration of topics presented.  Learning Barriers/Preferences:     Learning Barriers/Preferences - 04/06/16 1259      Learning Barriers/Preferences   Learning Barriers Hearing  memory deficits   Learning Preferences Pictoral;Audio  Patient completed the 7th grade      Education Topics: Count Your Pulse:  -Group instruction provided by verbal instruction, demonstration, patient participation and written materials to support subject.  Instructors address importance of being able to find your pulse and how to count your pulse when at home without a heart monitor.  Patients get hands on experience counting their pulse with staff help and individually.   Heart Attack, Angina, and Risk Factor Modification:  -Group instruction provided by verbal instruction, video, and written materials to support subject.  Instructors address signs and symptoms of angina and heart attacks.    Also discuss risk factors for heart disease and how to make changes to improve heart health risk factors.   Functional Fitness:  -Group instruction provided by verbal instruction, demonstration, patient participation, and written materials to support subject.  Instructors address safety measures for doing things around the house.  Discuss how to  get up and down off the floor, how to pick things up properly, how to safely get out of a chair without assistance, and balance training. Flowsheet Row CARDIAC REHAB PHASE II EXERCISE from 05/24/2016 in [facility]  Date  04/14/16  Instruction Review Code  2- meets goals/outcomes      Meditation and Mindfulness:  -Group instruction provided by verbal instruction, patient participation, and written materials to support subject.  Instructor addresses importance of mindfulness and meditation practice to help reduce stress and improve awareness.  Instructor also leads participants through a meditation exercise.  Flowsheet Row CARDIAC REHAB PHASE II EXERCISE from 05/24/2016 in [place]  Date  05/24/16  Instruction Review Code  2- meets goals/outcomes      Stretching for Flexibility and Mobility:  -Group instruction provided by verbal instruction, patient participation, and written materials to support subject.  Instructors lead participants through series of stretches that are designed to increase flexibility thus improving mobility.  These stretches are additional exercise for major muscle groups that are typically performed during regular warm up and cool down. Flowsheet Row CARDIAC REHAB PHASE II EXERCISE from 05/24/2016 in [facility]  Date  05/19/16  Instruction Review Code  2- meets goals/outcomes      Hands Only CPR Anytime:  -Group instruction provided by verbal instruction, video, patient participation and written materials to support subject.  Instructors co-teach with [employer]  video for hands only CPR.  Participants get hands on experience with mannequins.   Nutrition I class: Heart Healthy Eating:  -Group instruction provided by PowerPoint slides, verbal discussion, and written materials to support subject matter. The instructor gives an explanation and review of the Therapeutic Lifestyle  Changes diet recommendations, which includes a discussion on lipid goals, dietary fat, sodium, fiber, plant stanol/sterol esters, sugar, and the components of a well-balanced, healthy diet.   Nutrition II class: Lifestyle Skills:  -Group instruction provided by PowerPoint slides, verbal discussion, and written materials to support subject matter. The instructor gives an explanation and review of label reading, grocery shopping for heart health, heart healthy recipe modifications, and ways to make healthier choices when eating out.   Diabetes Question & Answer:  -Group instruction provided by PowerPoint slides, verbal discussion, and written materials to support subject matter. The instructor gives an explanation and review of diabetes co-morbidities, pre- and post-prandial blood glucose goals, pre-exercise blood glucose goals, signs, symptoms, and treatment of hypoglycemia and hyperglycemia, and foot care basics.   Diabetes Blitz:  -Group instruction provided by PowerPoint slides, verbal discussion, and written materials to support subject matter. The instructor gives an explanation and review of the physiology behind type 1 and type 2 diabetes, diabetes medications and rational behind using different medications, pre- and post-prandial blood glucose recommendations and Hemoglobin A1c goals, diabetes diet, and exercise including blood glucose guidelines for exercising safely.    Portion Distortion:  -Group instruction provided by PowerPoint slides, verbal discussion, written materials, and food models to support subject matter. The instructor gives an explanation of serving size versus portion size, changes in portions sizes over the last 20 years, and what consists of a serving from each food group.   Stress Management:  -Group instruction provided by verbal instruction, video, and written materials to support subject matter.  Instructors review role of stress in heart disease and how to cope  with stress positively.     Exercising on Your Own:  -Group instruction provided by verbal instruction, power point, and written materials to support subject.  Instructors discuss benefits of exercise, components of exercise, frequency and intensity of exercise, and end points for exercise.  Also discuss use of nitroglycerin and activating EMS.  Review options of places to exercise outside of rehab.  Review guidelines for sex with heart disease. Flowsheet Row CARDIAC REHAB PHASE II EXERCISE from 05/24/2016 in [facility] [place]  Date  04/19/16  Instruction Review Code  2- meets goals/outcomes      Cardiac Drugs I:  -Group instruction provided by verbal instruction and written materials to support subject.  Instructor reviews cardiac drug classes: antiplatelets, anticoagulants, beta blockers, and statins.  Instructor discusses reasons, side effects, and lifestyle considerations for each drug class.   Cardiac Drugs II:  -Group instruction provided by verbal instruction and written materials to support subject.  Instructor reviews cardiac drug classes: angiotensin converting enzyme inhibitors (ACE-I), angiotensin II receptor blockers (ARBs), nitrates, and calcium channel blockers.  Instructor discusses reasons, side effects, and lifestyle considerations for each drug class.   Anatomy and Physiology of the Circulatory System:  -Group instruction provided by verbal instruction, video, and written materials to support subject.  Reviews functional anatomy of heart, how it relates to various diagnoses, and what role the heart plays in the overall system.   Knowledge Questionnaire Score:     Knowledge Questionnaire Score - 04/04/16 1711      Knowledge Questionnaire Score  Pre Score 19/24      Core Components/Risk Factors/Patient Goals at Admission:     Personal Goals and Risk Factors at Admission - 04/07/16 0748      Core Components/Risk Factors/Patient Goals on  Admission   Increase Strength and Stamina Yes   Intervention Provide advice, education, support and counseling about physical activity/exercise needs.;Develop an individualized exercise prescription for aerobic and resistive training based on initial evaluation findings, risk stratification, comorbidities and participant's personal goals.   Expected Outcomes Achievement of increased cardiorespiratory fitness and enhanced flexibility, muscular endurance and strength shown through measurements of functional capacity and personal statement of participant.   Personal Goal Other Yes   Personal Goal Patient would like to be able to "run with his dogs". Get back to singing. Increase energy level.   Intervention Provide individualized exercise prescription including aerobic exercise 3-5 days per week and resistance training 2 days per week to improve cardiorespiratory fitness and functional capacity.   Expected Outcomes Participate in leisure activities (ie singing, running with his dogs) with sufficient energy.      Core Components/Risk Factors/Patient Goals Review:      Goals and Risk Factor Review    Row Name 05/08/16 1655 06/07/16 1651           Core Components/Risk Factors/Patient Goals Review   Personal Goals Review Other Other      Review Reviewed home exercise guidelines with patient. Pt has bike at home and plans to walk for his home exercise.  Patient is running with his dogs several hours a day, which was one of his personal goals. Pt is back to singing and feels that his energy level is increasing. Pt states he "can get up and do stuff" now.      Expected Outcomes Begin home exercise program in addition to cardiac rehab to improve cardiorespiratory fitness, so that pt can have energy and stamina to do desired leisure activities. Increase exercise duration to help increase energy and fitness levels.         Core Components/Risk Factors/Patient Goals at Discharge (Final Review):       Goals and Risk Factor Review - 06/07/16 1651      Core Components/Risk Factors/Patient Goals Review   Personal Goals Review Other   Review Patient is running with his dogs several hours a day, which was one of his personal goals. Pt is back to singing and feels that his energy level is increasing. Pt states he "can get up and do stuff" now.   Expected Outcomes Increase exercise duration to help increase energy and fitness levels.      ITP Comments:     ITP Comments    Row Name 04/04/16 1341           ITP Comments Medical Director- Dr. Fransico Him, MD          Comments:  Pt is making expected progress toward personal goals after completing 20 sessions. Pt with some hindrance due to musculoskeletal issues. Walking remains difficult for this pt. Recommend continued exercise and life style modification education including  stress management and relaxation techniques to decrease cardiac risk profile.  Psychosocial assessment remains unchanged.  No further needs identified. No further interventions are warranted at this time. Cherre Huger, BSN

## 2016-06-09 ENCOUNTER — Encounter: Payer: Self-pay | Admitting: Family

## 2016-06-09 ENCOUNTER — Encounter (HOSPITAL_COMMUNITY)
Admission: RE | Admit: 2016-06-09 | Discharge: 2016-06-09 | Disposition: A | Discharge status: Home / Self Care | Payer: Medicare Other | Source: Ambulatory Visit | Attending: Cardiovascular Disease | Admitting: Cardiovascular Disease

## 2016-06-09 DIAGNOSIS — Z951 Presence of aortocoronary bypass graft: Secondary | ICD-10-CM

## 2016-06-12 ENCOUNTER — Encounter (HOSPITAL_COMMUNITY)
Admission: RE | Admit: 2016-06-12 | Discharge: 2016-06-12 | Disposition: A | Discharge status: Home / Self Care | Payer: Medicare Other | Source: Ambulatory Visit | Attending: Cardiovascular Disease | Admitting: Cardiovascular Disease

## 2016-06-12 DIAGNOSIS — Z951 Presence of aortocoronary bypass graft: Secondary | ICD-10-CM

## 2016-06-14 ENCOUNTER — Other Ambulatory Visit: Payer: Self-pay | Admitting: Family Medicine

## 2016-06-14 ENCOUNTER — Encounter (HOSPITAL_COMMUNITY)
Admission: RE | Admit: 2016-06-14 | Discharge: 2016-06-14 | Disposition: A | Discharge status: Home / Self Care | Payer: Medicare Other | Source: Ambulatory Visit | Attending: Cardiovascular Disease | Admitting: Cardiovascular Disease

## 2016-06-14 DIAGNOSIS — Z951 Presence of aortocoronary bypass graft: Secondary | ICD-10-CM | POA: Diagnosis not present

## 2016-06-14 DIAGNOSIS — R911 Solitary pulmonary nodule: Secondary | ICD-10-CM

## 2016-06-15 ENCOUNTER — Ambulatory Visit (INDEPENDENT_AMBULATORY_CARE_PROVIDER_SITE_OTHER): Payer: Medicare Other | Admitting: Family

## 2016-06-15 ENCOUNTER — Ambulatory Visit (HOSPITAL_COMMUNITY)
Admission: RE | Admit: 2016-06-15 | Discharge: 2016-06-15 | Disposition: A | Discharge status: Home / Self Care | Payer: Medicare Other | Source: Ambulatory Visit | Attending: Family | Admitting: Family

## 2016-06-15 ENCOUNTER — Encounter: Payer: Medicare Other | Admitting: Cardiothoracic Surgery

## 2016-06-15 ENCOUNTER — Encounter: Payer: Self-pay | Admitting: Family

## 2016-06-15 VITALS — BP 143/88 | HR 107 | Temp 98.6°F | Resp 16 | Ht 68.0 in | Wt 199.0 lb

## 2016-06-15 DIAGNOSIS — I2510 Atherosclerotic heart disease of native coronary artery without angina pectoris: Secondary | ICD-10-CM

## 2016-06-15 DIAGNOSIS — Z9889 Other specified postprocedural states: Secondary | ICD-10-CM

## 2016-06-15 DIAGNOSIS — Z8673 Personal history of transient ischemic attack (TIA), and cerebral infarction without residual deficits: Secondary | ICD-10-CM

## 2016-06-15 DIAGNOSIS — Z48812 Encounter for surgical aftercare following surgery on the circulatory system: Secondary | ICD-10-CM

## 2016-06-15 DIAGNOSIS — I6521 Occlusion and stenosis of right carotid artery: Secondary | ICD-10-CM | POA: Diagnosis not present

## 2016-06-15 LAB — VAS US CAROTID
LCCADDIAS: 18 cm/s
LCCAPDIAS: 27 cm/s
LCCAPSYS: 149 cm/s
LEFT ECA DIAS: -17 cm/s
LEFT VERTEBRAL DIAS: 5 cm/s
LICAPSYS: 55 cm/s
Left CCA dist sys: 98 cm/s
Left ICA dist dias: -21 cm/s
Left ICA dist sys: -86 cm/s
Left ICA prox dias: 11 cm/s
RCCADSYS: -89 cm/s
RCCAPSYS: 87 cm/s
RIGHT CCA MID DIAS: -14 cm/s
RIGHT ECA DIAS: -13 cm/s
RIGHT VERTEBRAL DIAS: -5 cm/s
Right CCA prox dias: 10 cm/s

## 2016-06-15 NOTE — Patient Instructions (Signed)
Stroke Prevention Some medical conditions and behaviors are associated with an increased chance of having a stroke. You may prevent a stroke by making healthy choices and managing medical conditions. HOW CAN I REDUCE MY RISK OF HAVING A STROKE?   Stay physically active. Get at least 30 minutes of activity on most or all days.  Do not smoke. It may also be helpful to avoid exposure to secondhand smoke.  Limit alcohol use. Moderate alcohol use is considered to be:  No more than 2 drinks per day for men.  No more than 1 drink per day for nonpregnant women.  Eat healthy foods. This involves:  Eating 5 or more servings of fruits and vegetables a day.  Making dietary changes that address high blood pressure (hypertension), high cholesterol, diabetes, or obesity.  Manage your cholesterol levels.  Making food choices that are high in fiber and low in saturated fat, trans fat, and cholesterol may control cholesterol levels.  Take any prescribed medicines to control cholesterol as directed by your health care provider.  Manage your diabetes.  Controlling your carbohydrate and sugar intake is recommended to manage diabetes.  Take any prescribed medicines to control diabetes as directed by your health care provider.  Control your hypertension.  Making food choices that are low in salt (sodium), saturated fat, trans fat, and cholesterol is recommended to manage hypertension.  Ask your health care provider if you need treatment to lower your blood pressure. Take any prescribed medicines to control hypertension as directed by your health care provider.  If you are 18-39 years of age, have your blood pressure checked every 3-5 years. If you are 40 years of age or older, have your blood pressure checked every year.  Maintain a healthy weight.  Reducing calorie intake and making food choices that are low in sodium, saturated fat, trans fat, and cholesterol are recommended to manage  weight.  Stop drug abuse.  Avoid taking birth control pills.  Talk to your health care provider about the risks of taking birth control pills if you are over 35 years old, smoke, get migraines, or have ever had a blood clot.  Get evaluated for sleep disorders (sleep apnea).  Talk to your health care provider about getting a sleep evaluation if you snore a lot or have excessive sleepiness.  Take medicines only as directed by your health care provider.  For some people, aspirin or blood thinners (anticoagulants) are helpful in reducing the risk of forming abnormal blood clots that can lead to stroke. If you have the irregular heart rhythm of atrial fibrillation, you should be on a blood thinner unless there is a good reason you cannot take them.  Understand all your medicine instructions.  Make sure that other conditions (such as anemia or atherosclerosis) are addressed. SEEK IMMEDIATE MEDICAL CARE IF:   You have sudden weakness or numbness of the face, arm, or leg, especially on one side of the body.  Your face or eyelid droops to one side.  You have sudden confusion.  You have trouble speaking (aphasia) or understanding.  You have sudden trouble seeing in one or both eyes.  You have sudden trouble walking.  You have dizziness.  You have a loss of balance or coordination.  You have a sudden, severe headache with no known cause.  You have new chest pain or an irregular heartbeat. Any of these symptoms may represent a serious problem that is an emergency. Do not wait to see if the symptoms will   go away. Get medical help at once. Call your local emergency services (911 in [place] Do not drive yourself to the hospital.   This information is not intended to replace advice given to you by your health care provider. Make sure you discuss any questions you have with your health care provider.   Document Released: 10/19/2004 Document Revised: 10/02/2014 Document Reviewed:  03/14/2013 Elsevier Interactive Patient Education 2016 Elsevier Inc.  

## 2016-06-15 NOTE — Progress Notes (Signed)
Chief Complaint: Follow up Extracranial Carotid Artery Stenosis   History of Present Illness  Trevor Ward is a 75 y.o. male patient of Dr. Oneida Alar who presents for routine surveillance s/p right CEA (Date: 04/21/15). [name] neck incision is well healed with minimal swelling.  He has not had any post operative TIA or stroke symptoms.  He had a pre-opererative stoke on 04/11/15 as manifested by left facial drooping and staggering. He was evaluated that evening at [facility] per wife. Wife states that MRI of his brain shows he had a small stroke. He denies any subsequent strokes.  He no longer staggers and  no longer has the daily headache that he had before the CEA.  He denies any history of amaurosis fugax, hemiparesis, or speech difficulties.   He had a 5 vessel CABG in May 2017. He states he feels better since his CABG. He has a cough when he lays down at night.  He denies any worse cough or dyspnea than usual.   He had a non melanoma skin cancer removed from his right temple recently.   Pt Diabetic: "pre diabetic" Pt smoker: non smoker  Pt meds include: Statin : no, he had severe myalgias when he took a statin ASA: yes Other anticoagulants/antiplatelets: no   Past Medical History:  Diagnosis Date  . Arthritis    "fingers" (02/01/2016)  . Depression   . GERD (gastroesophageal reflux disease)   . High cholesterol    "can't take RX; made my legs weak" (02/01/2016)  . History of gout   . History of hiatal hernia   . Hypertension   . Peripheral vascular disease ([place])    carotid artery stenosis  . Skin cancer of face     "the good kind"  . Stroke [facility]) 2016   denies residual on 02/01/2016    Social History Social History  Substance Use Topics  . Smoking status: Former Smoker    Years: 1.50    Types: Cigarettes    Start date: 01/28/1956  . Smokeless tobacco: Never Used     Comment: "quit smoking cigarettes when I was 75 yr old"  . Alcohol use No    Family  History Family History  Problem Relation Age of Onset  . CAD Mother   . CVA Mother   . Diabetes Mother   . Breast cancer Mother   . Parkinson's disease Mother   . Arthritis Mother   . Heart attack Father   . CVA Sister   . CAD Brother   . Diabetes Brother   . CVA Brother   . CAD Brother   . Diabetes Brother     Surgical History Past Surgical History:  Procedure Laterality Date  . CARDIAC CATHETERIZATION N/A 02/01/2016   Procedure: Left Heart Cath and Coronary Angiography;  Surgeon: Burnell Blanks, MD;  Location: [place] [facility];  Service: Cardiovascular;  Laterality: N/A;  . COLONOSCOPY    . CORONARY ARTERY BYPASS GRAFT N/A 02/02/2016   Procedure: CORONARY ARTERY BYPASS GRAFTING (CABG) times 4 using left internal mammary and left saphenous ven harvested by endoven ;  Surgeon: Grace Isaac, MD;  Location: [place];  Service: Open Heart Surgery;  Laterality: N/A;  . ENDARTERECTOMY Right 04/21/2015   Procedure: RIGHT CAROTID ENDARTERECTOMY;  Surgeon: Elam Dutch, MD;  Location: [place];  Service: Vascular;  Laterality: Right;  . ESOPHAGOGASTRODUODENOSCOPY (EGD) WITH PROPOFOL N/A 01/03/2016   Procedure: ESOPHAGOGASTRODUODENOSCOPY (EGD) WITH PROPOFOL;  Surgeon: Garlan Fair, MD;  Location: [facility];  Service: Endoscopy;  Laterality: N/A;  . KNEE ARTHROSCOPY Right   . PERIPHERAL VASCULAR CATHETERIZATION Bilateral 04/14/2015   Procedure: Carotid Angiography;  Surgeon: Serafina Mitchell, MD;  Location: [place] [facility];  Service: Cardiovascular;  Laterality: Bilateral;  . SKIN CANCER EXCISION Right    "temple"  . TEE WITHOUT CARDIOVERSION N/A 02/02/2016   Procedure: TRANSESOPHAGEAL ECHOCARDIOGRAM (TEE);  Surgeon: Grace Isaac, MD;  Location: [place];  Service: Open Heart Surgery;  Laterality: N/A;    Allergies  Allergen Reactions  . Statins Other (See Comments)    Severe myalgias    Current Outpatient Prescriptions  Medication Sig Dispense Refill  .  allopurinol (ZYLOPRIM) 100 MG tablet Take 100 mg by mouth daily.     Marland Kitchen amitriptyline (ELAVIL) 10 MG tablet Take 10 mg by mouth at bedtime.     Marland Kitchen aspirin EC 81 MG EC tablet Take 1 tablet (81 mg total) by mouth daily.    . colchicine 0.6 MG tablet     . esomeprazole (NEXIUM) 40 MG capsule Take 40 mg by mouth daily.     . Glucosamine Sulfate (GLUCOSAMINE RELIEF) 1000 MG TABS Take 2,000 mg by mouth daily.    . metoprolol tartrate (LOPRESSOR) 25 MG tablet Take 1 tablet (25 mg total) by mouth 2 (two) times daily. (Patient taking differently: Take 25 mg by mouth daily. Daughter reports pt takes twice a day) 60 tablet 3  . rosuvastatin (CRESTOR) 5 MG tablet Take 1 tablet (5 mg total) by mouth daily. 30 tablet 3  . traMADol (ULTRAM) 50 MG tablet Take 1 tablet (50 mg total) by mouth every 4 (four) hours as needed for moderate pain. 30 tablet 0  . vitamin B-12 (CYANOCOBALAMIN) 500 MCG tablet Take 500 mcg by mouth daily.     No current facility-administered medications for this visit.     Review of Systems : See HPI for pertinent positives and negatives.  Physical Examination  Vitals:   06/15/16 1450 06/15/16 1456  BP: 127/83 (!) 143/88  Pulse: (!) 107   Resp: 16   Temp: 98.6 F (37 C)   TempSrc: Oral   SpO2: 96%   Weight: 199 lb (90.3 kg)   Height: 5\' 8"  (1.727 m)    Body mass index is 30.26 kg/m.  General: WDWN male in NAD GAIT: normal Eyes: PERRLA Pulmonary:  Non-labored respirations at rest, but labored putting on his socks and shoes, rales in left posterior fields, no rhonchi or wheezes.  Cardiac: regular rhythm,  no detected murmur.  VASCULAR EXAM Carotid Bruits Right Left   Negative Negative    Aorta is not palpable. Radial pulses are 2+ palpable and equal.  LE Pulses Right Left       POPLITEAL  not palpable  not palpable       POSTERIOR  TIBIAL   palpable   palpable       DORSALIS PEDIS      ANTERIOR TIBIAL  palpable  palpable    Gastrointestinal: soft, nontender, BS WNL, no r/g,  no palpable masses.  Musculoskeletal: no muscle atrophy/wasting. M/S 5/5 throughout, extremities without ischemic changes  Neurologic: A&O X 3; Appropriate Affect, Speech is normal CN 2-12 intact except is hard of hearing, pain and light touch intact in extremities, Motor exam as listed above.    Assessment: Trevor Ward is a 75 y.o. male who is s/p right CEA on 04/21/15. He had a pre-opererative stoke on 04/11/15 as manifested by left facial drooping and staggering. He was evaluated that evening at [facility] per wife. Wife states that MRI of his brain shows he had a small stroke. He denies any subsequent strokes.  He no longer staggers and no longer has the daily headache that he had before the CEA.  Fortunately he does not have DM and never used tobacco. He is statin intolerant and takes a daily ASA.   I advised pt to see his PCP re the rales in his posterior left lung fields, and night time cough. He denies that his cough or dyspnea is any worse than usual.   DATA Today's carotid duplex suggests right CEA site with <40% restenosis, and <40% stenosis of the left ICA. Bilateral vertebral arteries are antegrade. Bilateral subclavian arteries are multiphasic (normal).  No significant change compared to exam on 12/09/15.   Plan: Follow-up in 1 year with Carotid Duplex scan.   I discussed in depth with the patient the nature of atherosclerosis, and emphasized the importance of maximal medical management including strict control of blood pressure, blood glucose, and lipid levels, obtaining regular exercise, and continued cessation of smoking.  The patient is aware that without maximal medical management the underlying atherosclerotic disease process will progress, limiting the benefit of any interventions. The patient was given information  about stroke prevention and what symptoms should prompt the patient to seek immediate medical care. Thank you for allowing [place] to participate in this patient's care.  Clemon Chambers, RN, MSN, FNP-C [facility] Office: [phone]  Clinic Physician: Oneida Alar  06/15/16 3:04 PM

## 2016-06-16 ENCOUNTER — Encounter (HOSPITAL_COMMUNITY): Payer: Medicare Other

## 2016-06-16 ENCOUNTER — Telehealth (HOSPITAL_COMMUNITY): Payer: Self-pay | Admitting: Family Medicine

## 2016-06-19 ENCOUNTER — Encounter (HOSPITAL_COMMUNITY)
Admission: RE | Admit: 2016-06-19 | Discharge: 2016-06-19 | Disposition: A | Discharge status: Home / Self Care | Payer: Medicare Other | Source: Ambulatory Visit | Attending: Cardiovascular Disease | Admitting: Cardiovascular Disease

## 2016-06-19 DIAGNOSIS — Z951 Presence of aortocoronary bypass graft: Secondary | ICD-10-CM

## 2016-06-21 ENCOUNTER — Encounter (HOSPITAL_COMMUNITY)
Admission: RE | Admit: 2016-06-21 | Discharge: 2016-06-21 | Disposition: A | Discharge status: Home / Self Care | Payer: Medicare Other | Source: Ambulatory Visit | Attending: Cardiovascular Disease | Admitting: Cardiovascular Disease

## 2016-06-21 ENCOUNTER — Other Ambulatory Visit: Payer: Self-pay

## 2016-06-21 DIAGNOSIS — Z951 Presence of aortocoronary bypass graft: Secondary | ICD-10-CM

## 2016-06-21 MED ORDER — METOPROLOL TARTRATE 25 MG PO TABS: 25.0000 mg | ORAL_TABLET | Freq: Two times a day (BID) | ORAL | 3 refills | Status: DC

## 2016-06-22 ENCOUNTER — Ambulatory Visit (INDEPENDENT_AMBULATORY_CARE_PROVIDER_SITE_OTHER): Payer: Medicare Other | Admitting: Cardiothoracic Surgery

## 2016-06-22 ENCOUNTER — Encounter: Payer: Self-pay | Admitting: Cardiothoracic Surgery

## 2016-06-22 ENCOUNTER — Other Ambulatory Visit: Payer: Self-pay | Admitting: Cardiovascular Disease

## 2016-06-22 VITALS — BP 129/83 | HR 85 | Resp 16 | Ht 68.0 in | Wt 200.4 lb

## 2016-06-22 DIAGNOSIS — Z951 Presence of aortocoronary bypass graft: Secondary | ICD-10-CM | POA: Diagnosis not present

## 2016-06-22 DIAGNOSIS — I200 Unstable angina: Secondary | ICD-10-CM

## 2016-06-22 DIAGNOSIS — I2510 Atherosclerotic heart disease of native coronary artery without angina pectoris: Secondary | ICD-10-CM | POA: Diagnosis not present

## 2016-06-22 DIAGNOSIS — I25110 Atherosclerotic heart disease of native coronary artery with unstable angina pectoris: Secondary | ICD-10-CM | POA: Diagnosis not present

## 2016-06-22 NOTE — Progress Notes (Signed)
[facility] [facility] [address]       [place] [address]             [phone]      Zaul A Addison [place] Medical Record [id number] Date of Birth: [birth year]  Referring: Burnell Blanks* Primary Care: Mayra Neer, MD  Chief Complaint:   POST OP FOLLOW UP  History of Present Illness:     02/02/2016 OPERATIVE REPORT PREOPERATIVE DIAGNOSIS: Coronary occlusive disease with unstable angina. POSTOPERATIVE DIAGNOSIS: Coronary occlusive disease with unstable angina. SURGICAL PROCEDURE: Coronary artery bypass grafting x4 with left internal mammary to left anterior descending coronary artery, sequential reverse saphenous vein graft to the first and second intermediate reverse saphenous vein graft to the posterior descending with the left greater saphenous vein endoscopic harvesting left thigh. SURGEON: Lanelle Bal, MD.  Patient comes to the office today following recent discharge from coronary artery bypass grafting. Since discharge she is continued to make good progress at home denies fever or chills has had no wound drainage, his in-hospital confusion has improved. He comes in today because of pain in his left wrist that started yesterday. He does have a history of gout in the past primarily in his ankles.    Past Medical History:  Diagnosis Date  . Arthritis    "fingers" (02/01/2016)  . Depression   . GERD (gastroesophageal reflux disease)   . High cholesterol    "can't take RX; made my legs weak" (02/01/2016)  . History of gout   . History of hiatal hernia   . Hypertension   . Peripheral vascular disease ([place])    carotid artery stenosis  . Skin cancer of face     "the good kind"  . Stroke [facility]) 2016   denies residual on 02/01/2016     History  Smoking Status  . Former Smoker  . Years: 1.50  . Types: Cigarettes  . Start date: 01/28/1956  Smokeless Tobacco  . Never Used    Comment: "quit smoking cigarettes when I was 75 yr old"    History    Alcohol Use No     Allergies  Allergen Reactions  . Statins Other (See Comments)    Severe myalgias    Current Outpatient Prescriptions  Medication Sig Dispense Refill  . allopurinol (ZYLOPRIM) 100 MG tablet Take 100 mg by mouth daily.     Marland Kitchen amitriptyline (ELAVIL) 10 MG tablet Take 10 mg by mouth at bedtime.     Marland Kitchen aspirin EC 81 MG EC tablet Take 1 tablet (81 mg total) by mouth daily.    . colchicine 0.6 MG tablet     . esomeprazole (NEXIUM) 40 MG capsule Take 40 mg by mouth daily.     . Glucosamine Sulfate (GLUCOSAMINE RELIEF) 1000 MG TABS Take 2,000 mg by mouth daily.    . metoprolol tartrate (LOPRESSOR) 25 MG tablet TAKE 1 TABLET BY MOUTH TWICE A DAY 60 tablet 11  . rosuvastatin (CRESTOR) 5 MG tablet Take 1 tablet (5 mg total) by mouth daily. 30 tablet 3  . traMADol (ULTRAM) 50 MG tablet Take 1 tablet (50 mg total) by mouth every 4 (four) hours as needed for moderate pain. 30 tablet 0  . vitamin B-12 (CYANOCOBALAMIN) 500 MCG tablet Take 500 mcg by mouth daily.     No current facility-administered medications for this visit.        Physical Exam: There were no vitals taken for this visit.  General appearance: alert, cooperative and no  distress Neurologic: intact Heart: regular rate and rhythm, S1, S2 normal, no murmur, click, rub or gallop Lungs: clear to auscultation bilaterally Abdomen: soft, non-tender; bowel sounds normal; no masses,  no organomegaly Extremities: extremities normal, atraumatic, no cyanosis or edema and There's no evidence of phlebitis in either arm from previous IV sites or arterial line or cath sites, the left wrist is a slightly swollen compared the right Wound: Patient sternal since incision is well-healed there is no drainage or evidence of cellulitis   Diagnostic Studies & Laboratory data:     Recent Radiology Findings:   No results found.    Recent Lab Findings: Lab Results  Component Value Date   WBC 7.6 02/08/2016   HGB 11.2 (L)  02/08/2016   HCT 34.2 (L) 02/08/2016   PLT 275 02/08/2016   GLUCOSE 113 (H) 05/16/2016   CHOL 137 05/16/2016   TRIG 155 (H) 05/16/2016   HDL 23 (L) 05/16/2016   LDLCALC 83 05/16/2016   ALT 10 05/16/2016   AST 14 05/16/2016   NA 140 05/16/2016   K 4.7 05/16/2016   CL 102 05/16/2016   CREATININE 0.85 05/16/2016   BUN 8 05/16/2016   CO2 27 05/16/2016   TSH 2.55 01/31/2016   INR 1.41 02/02/2016   HGBA1C 6.4 (H) 02/02/2016   Ct: FROM MAY 2017 IMPRESSION: 1. Multiple calcified pleural plaques again noted bilaterally consistent with prior asbestos exposure. These are very similar in appearance to the prior CT in 2006 with potentially some interval increase calcifications in several of the plaques. No evidence to suggest malignancy. 2. Chronic lung disease with bronchiolectasis in both lower lungs, right greater than left. No evidence to suggest progressive asbestosis. 3. Coronary atherosclerosis. 4. Mildly dilated ascending thoracic aorta measuring 4.1-4.2 cm. Recommend annual imaging followup by CTA or MRA. This recommendation follows 2010 ACCF/AHA/AATS/ACR/ASA/SCA/SCAI/SIR/STS/SVM Guidelines for the Diagnosis and Management of Patients with Thoracic Aortic Disease. Circulation. 2010; [id number]   Assessment / Plan:    Patient doing well following coronary artery bypass grafting,    Grace Isaac MD      [address] [place], [address] Office [phone]   Beeper [phone]  06/22/2016 3:53 PM

## 2016-06-22 NOTE — Telephone Encounter (Signed)
Rx request sent to pharmacy.  

## 2016-06-23 ENCOUNTER — Encounter (HOSPITAL_COMMUNITY)
Admission: RE | Admit: 2016-06-23 | Discharge: 2016-06-23 | Disposition: A | Discharge status: Home / Self Care | Payer: Medicare Other | Source: Ambulatory Visit | Attending: Cardiovascular Disease | Admitting: Cardiovascular Disease

## 2016-06-23 DIAGNOSIS — Z951 Presence of aortocoronary bypass graft: Secondary | ICD-10-CM | POA: Diagnosis not present

## 2016-06-26 ENCOUNTER — Ambulatory Visit
Admission: RE | Admit: 2016-06-26 | Discharge: 2016-06-26 | Disposition: A | Discharge status: Home / Self Care | Payer: Medicare Other | Source: Ambulatory Visit | Attending: Family Medicine | Admitting: Family Medicine

## 2016-06-26 ENCOUNTER — Encounter (HOSPITAL_COMMUNITY): Payer: Medicare Other

## 2016-06-26 ENCOUNTER — Telehealth (HOSPITAL_COMMUNITY): Payer: Self-pay | Admitting: Family Medicine

## 2016-06-26 DIAGNOSIS — R911 Solitary pulmonary nodule: Secondary | ICD-10-CM

## 2016-06-26 DIAGNOSIS — R918 Other nonspecific abnormal finding of lung field: Secondary | ICD-10-CM | POA: Diagnosis not present

## 2016-06-26 MED ORDER — IOPAMIDOL (ISOVUE-300) INJECTION 61%
75.0000 mL | Freq: Once | INTRAVENOUS | Status: AC | PRN
  Administered 2016-06-26: 75 mL via INTRAVENOUS

## 2016-06-28 ENCOUNTER — Encounter (HOSPITAL_COMMUNITY): Payer: Medicare Other

## 2016-06-28 ENCOUNTER — Encounter (HOSPITAL_COMMUNITY)
Admission: RE | Admit: 2016-06-28 | Discharge: 2016-06-28 | Disposition: A | Discharge status: Home / Self Care | Payer: Medicare Other | Source: Ambulatory Visit | Attending: Cardiovascular Disease | Admitting: Cardiovascular Disease

## 2016-06-28 DIAGNOSIS — Z87891 Personal history of nicotine dependence: Secondary | ICD-10-CM | POA: Insufficient documentation

## 2016-06-28 DIAGNOSIS — Z48812 Encounter for surgical aftercare following surgery on the circulatory system: Secondary | ICD-10-CM | POA: Diagnosis not present

## 2016-06-28 DIAGNOSIS — K219 Gastro-esophageal reflux disease without esophagitis: Secondary | ICD-10-CM | POA: Diagnosis not present

## 2016-06-28 DIAGNOSIS — E7800 Pure hypercholesterolemia, unspecified: Secondary | ICD-10-CM | POA: Insufficient documentation

## 2016-06-28 DIAGNOSIS — Z79899 Other long term (current) drug therapy: Secondary | ICD-10-CM | POA: Diagnosis not present

## 2016-06-28 DIAGNOSIS — Z951 Presence of aortocoronary bypass graft: Secondary | ICD-10-CM | POA: Insufficient documentation

## 2016-06-28 DIAGNOSIS — Z8673 Personal history of transient ischemic attack (TIA), and cerebral infarction without residual deficits: Secondary | ICD-10-CM | POA: Insufficient documentation

## 2016-06-30 ENCOUNTER — Encounter (HOSPITAL_COMMUNITY): Payer: Medicare Other

## 2016-07-03 ENCOUNTER — Encounter (HOSPITAL_COMMUNITY): Payer: Medicare Other

## 2016-07-05 ENCOUNTER — Encounter (HOSPITAL_COMMUNITY)
Admission: RE | Admit: 2016-07-05 | Discharge: 2016-07-05 | Disposition: A | Discharge status: Home / Self Care | Payer: Medicare Other | Source: Ambulatory Visit | Attending: Cardiovascular Disease | Admitting: Cardiovascular Disease

## 2016-07-05 DIAGNOSIS — Z87891 Personal history of nicotine dependence: Secondary | ICD-10-CM | POA: Diagnosis not present

## 2016-07-05 DIAGNOSIS — E7800 Pure hypercholesterolemia, unspecified: Secondary | ICD-10-CM | POA: Diagnosis not present

## 2016-07-05 DIAGNOSIS — K219 Gastro-esophageal reflux disease without esophagitis: Secondary | ICD-10-CM | POA: Diagnosis not present

## 2016-07-05 DIAGNOSIS — Z8673 Personal history of transient ischemic attack (TIA), and cerebral infarction without residual deficits: Secondary | ICD-10-CM | POA: Diagnosis not present

## 2016-07-05 DIAGNOSIS — Z951 Presence of aortocoronary bypass graft: Secondary | ICD-10-CM | POA: Diagnosis not present

## 2016-07-05 DIAGNOSIS — Z48812 Encounter for surgical aftercare following surgery on the circulatory system: Secondary | ICD-10-CM | POA: Diagnosis not present

## 2016-07-06 NOTE — Progress Notes (Signed)
Cardiac Individual Treatment Plan  Patient Details  Name: Trevor Ward MRN: [id number] Date of Birth: [birth year] Referring Provider:   Flowsheet Row CARDIAC REHAB PHASE II ORIENTATION from 04/04/2016 in [place]  Referring Provider  Croitoru, Dani Gobble, MD      Initial Encounter Date:  [place] from 04/04/2016 in [place]  Date  04/04/16  Referring Provider  Croitoru, Mihai, MD      Visit Diagnosis: S/P CABG x 4  Patient's Home Medications on Admission:  Current Outpatient Prescriptions:  .  allopurinol (ZYLOPRIM) 100 MG tablet, Take 100 mg by mouth daily. , Disp: , Rfl:  .  amitriptyline (ELAVIL) 10 MG tablet, Take 10 mg by mouth at bedtime. , Disp: , Rfl:  .  aspirin EC 81 MG EC tablet, Take 1 tablet (81 mg total) by mouth daily., Disp: , Rfl:  .  colchicine 0.6 MG tablet, , Disp: , Rfl:  .  esomeprazole (NEXIUM) 40 MG capsule, Take 40 mg by mouth daily. , Disp: , Rfl:  .  Glucosamine Sulfate (GLUCOSAMINE RELIEF) 1000 MG TABS, Take 2,000 mg by mouth daily., Disp: , Rfl:  .  metoprolol tartrate (LOPRESSOR) 25 MG tablet, TAKE 1 TABLET BY MOUTH TWICE A DAY, Disp: 60 tablet, Rfl: 11 .  rosuvastatin (CRESTOR) 5 MG tablet, Take 1 tablet (5 mg total) by mouth daily. (Patient taking differently: Take 5 mg by mouth. ONLY TWO TIMES A WEEK), Disp: 30 tablet, Rfl: 3 .  traMADol (ULTRAM) 50 MG tablet, Take 1 tablet (50 mg total) by mouth every 4 (four) hours as needed for moderate pain., Disp: 30 tablet, Rfl: 0 .  vitamin B-12 (CYANOCOBALAMIN) 500 MCG tablet, Take 500 mcg by mouth daily., Disp: , Rfl:   Past Medical History: Past Medical History:  Diagnosis Date  . Arthritis    "fingers" (02/01/2016)  . Depression   . GERD (gastroesophageal reflux disease)   . High cholesterol    "can't take RX; made my legs weak" (02/01/2016)  . History of gout   . History of hiatal hernia   .  Hypertension   . Peripheral vascular disease ([place])    carotid artery stenosis  . Skin cancer of face     "the good kind"  . Stroke [facility]) 2016   denies residual on 02/01/2016    Tobacco Use: History  Smoking Status  . Former Smoker  . Years: 1.50  . Types: Cigarettes  . Start date: 01/28/1956  Smokeless Tobacco  . Never Used    Comment: "quit smoking cigarettes when I was 75 yr old"    Labs: Recent Review Flowsheet Data    Labs for ITP Cardiac and Pulmonary Rehab Latest Ref Rng & Units 02/02/2016 02/02/2016 02/02/2016 02/03/2016 05/16/2016   Cholestrol 125 - 200 mg/dL - - - - 137   LDLCALC <130 mg/dL - - - - 83   HDL >=40 mg/dL - - - - 23(L)   Trlycerides <150 mg/dL - - - - 155(H)   Hemoglobin A1c 4.8 - 5.6 % - - - - -   PHART 7.350 - 7.450 7.326(L) 7.324(L) - - -   PCO2ART 35.0 - 45.0 mmHg 40.2 42.8 - - -   HCO3 20.0 - 24.0 mEq/L 20.9 22.1 - - -   TCO2 0 - 100 mmol/L 22 23 22 23  -   ACIDBASEDEF 0.0 - 2.0 mmol/L 5.0(H) 4.0(H) - - -   O2SAT %  93.0 91.0 - - -      Capillary Blood Glucose: Lab Results  Component Value Date   GLUCAP 103 (H) 04/14/2016   GLUCAP 128 (H) 02/05/2016   GLUCAP 97 02/04/2016   GLUCAP 91 02/04/2016   GLUCAP 117 (H) 02/04/2016     Exercise Target Goals:    Exercise Program Goal: Individual exercise prescription set with THRR, safety & activity barriers. Participant demonstrates ability to understand and report RPE using BORG scale, to self-measure pulse accurately, and to acknowledge the importance of the exercise prescription.  Exercise Prescription Goal: Starting with aerobic activity 30 plus minutes a day, 3 days per week for initial exercise prescription. Provide home exercise prescription and guidelines that participant acknowledges understanding prior to discharge.  Activity Barriers & Risk Stratification:     Activity Barriers & Cardiac Risk Stratification - 04/06/16 1258      Activity Barriers & Cardiac Risk Stratification   Activity  Barriers History of Falls      6 Minute Walk:     6 Minute Walk    Row Name 04/04/16 1706         6 Minute Walk   Phase Initial     Distance 1495 feet     Walk Time 6 minutes     # of Rest Breaks 0     MPH 2.83     METS 2.77     RPE 11     VO2 Peak 9.71     Symptoms No     Resting HR 72 bpm     Resting BP 108/60     Max Ex. HR 92 bpm     Max Ex. BP 120/70     2 Minute Post BP 112/70        Initial Exercise Prescription:     Initial Exercise Prescription - 04/04/16 1700      Date of Initial Exercise RX and Referring Provider   Date 04/04/16   Referring Provider Croitoru, Dani Gobble, MD     Bike   Level 0.8   Minutes 10   METs 2.84     NuStep   Level 2   Minutes 10   METs 2.2     Track   Laps 8   Minutes 10   METs 2.39     Prescription Details   Frequency (times per week) 3   Duration Progress to 30 minutes of continuous aerobic without signs/symptoms of physical distress     Intensity   THRR 40-80% of Max Heartrate 57-114   Ratings of Perceived Exertion 11-13   Perceived Dyspnea 0-4      Perform Capillary Blood Glucose checks as needed.  Exercise Prescription Changes:     Exercise Prescription Changes    Row Name 04/26/16 1400 05/10/16 1400 06/05/16 0800 07/03/16 1600       Exercise Review   Progression Yes Yes - Yes      Response to Exercise   Blood Pressure (Admit) 128/82 142/82 128/72 118/82    Blood Pressure (Exercise) 154/90 140/80 136/70 128/68    Blood Pressure (Exit) 110/80 124/62 100/68 124/70    Heart Rate (Admit) 84 bpm 70 bpm 82 bpm 78 bpm    Heart Rate (Exercise) 110 bpm 119 bpm 99 bpm 116 bpm    Heart Rate (Exit) 94 bpm 75 bpm 81 bpm 79 bpm    Rating of Perceived Exertion (Exercise) 13 12 11 11     Symptoms none none none none  Comments  - Reviewed home exercise on 05/08/16. Reviewed home exercise on 05/08/16. Reviewed home exercise on 05/08/16.    Duration Progress to 30 minutes of continuous aerobic without signs/symptoms  of physical distress Progress to 30 minutes of continuous aerobic without signs/symptoms of physical distress Progress to 30 minutes of continuous aerobic without signs/symptoms of physical distress Progress to 30 minutes of continuous aerobic without signs/symptoms of physical distress    Intensity THRR unchanged THRR unchanged THRR unchanged THRR unchanged      Progression   Progression Continue to progress workloads to maintain intensity without signs/symptoms of physical distress. Continue to progress workloads to maintain intensity without signs/symptoms of physical distress. Continue to progress workloads to maintain intensity without signs/symptoms of physical distress. Continue to progress workloads to maintain intensity without signs/symptoms of physical distress.    Average METs 2.9 3 2.8 3.1      Resistance Training   Training Prescription Yes Yes Yes Yes    Weight 3lbs 3lbs 3lbs 3lbs    Reps 10-12 10-12 10-12 10-12      Interval Training   Interval Training No No No No      Bike   Level 1.2 1.2 1.3 1.3    Minutes 10 10 10 10     METs 3.6 3.58 3.77 3.7      NuStep   Level 1 1 3 3     Minutes 10 10 10 10     METs 2.4 2.2 2.3 2.3      Arm Ergometer   Level  -  - 1 1    Minutes  -  - 10 25    METs  -  - 2.48 2.44      Track   Laps 16 13 -  -    Minutes 10 10 -  -    METs 3.79 3.26 -  -      Home Exercise Plan   Plans to continue exercise at  - [facility]    Frequency  - Add 2 additional days to program exercise sessions. Add 2 additional days to program exercise sessions. Add 2 additional days to program exercise sessions.       Exercise Comments:     Exercise Comments    Row Name 04/26/16 1447 05/08/16 1655 06/07/16 1650 07/03/16 1647     Exercise Comments Patient off to a good start with exercise. Reviewed home exercise guidelines with patient and pt's wife. Patient is riding his staionary bike 8-10 minutes daily. Encouraged him to increase duration to 15  minutes and will progress from there. Making steady progress with exercise.       Discharge Exercise Prescription (Final Exercise Prescription Changes):     Exercise Prescription Changes - 07/03/16 1600      Exercise Review   Progression Yes     Response to Exercise   Blood Pressure (Admit) 118/82   Blood Pressure (Exercise) 128/68   Blood Pressure (Exit) 124/70   Heart Rate (Admit) 78 bpm   Heart Rate (Exercise) 116 bpm   Heart Rate (Exit) 79 bpm   Rating of Perceived Exertion (Exercise) 11   Symptoms none   Comments Reviewed home exercise on 05/08/16.   Duration Progress to 30 minutes of continuous aerobic without signs/symptoms of physical distress   Intensity THRR unchanged     Progression   Progression Continue to progress workloads to maintain intensity without signs/symptoms of physical distress.   Average METs 3.1     Resistance Training  Training Prescription Yes   Weight 3lbs   Reps 10-12     Interval Training   Interval Training No     Bike   Level 1.3   Minutes 10   METs 3.7     NuStep   Level 3   Minutes 10   METs 2.3     Arm Ergometer   Level 1   Minutes 25   METs 2.44     Home Exercise Plan   Plans to continue exercise at Home   Frequency Add 2 additional days to program exercise sessions.      Nutrition:  Target Goals: Understanding of nutrition guidelines, daily intake of sodium 1500mg , cholesterol 200mg , calories 30% from fat and 7% or less from saturated fats, daily to have 5 or more servings of fruits and vegetables.  Biometrics:     Pre Biometrics - 04/04/16 1709      Pre Biometrics   Waist Circumference 38.5 inches   Hip Circumference 40 inches   Waist to Hip Ratio 0.96 %   Triceps Skinfold 10 mm   % Body Fat 25.1 %   Grip Strength 38 kg   Flexibility 11 in   Single Leg Stand 5.84 seconds       Nutrition Therapy Plan and Nutrition Goals:     Nutrition Therapy & Goals - 05/01/16 1219      Nutrition Therapy    Diet Therapeutic Lifestyle Changes     Personal Nutrition Goals   Personal Goal #1 Pt to maintain wt while in Cardiac Rehab   Personal Goal #2 Pt to understand the basics of a Carb Modified, Diabetic diet     Intervention Plan   Intervention Prescribe, educate and counsel regarding individualized specific dietary modifications aiming towards targeted core components such as weight, hypertension, lipid management, diabetes, heart failure and other comorbidities.;Nutrition handout(s) given to patient.  Pre-diabetes handout given   Expected Outcomes Short Term Goal: Understand basic principles of dietary content, such as calories, fat, sodium, cholesterol and nutrients.;Long Term Goal: Adherence to prescribed nutrition plan.      Nutrition Discharge: Nutrition Scores:     Nutrition Assessments - 05/01/16 1219      MEDFICTS Scores   Pre Score 47      Nutrition Goals Re-Evaluation:   Psychosocial: Target Goals: Acknowledge presence or absence of depression, maximize coping skills, provide positive support system. Participant is able to verbalize types and ability to use techniques and skills needed for reducing stress and depression.  Initial Review & Psychosocial Screening:     Initial Psych Review & Screening - 04/06/16 [place]? Yes     Barriers   Psychosocial barriers to participate in program The patient should benefit from training in stress management and relaxation.     Screening Interventions   Interventions Encouraged to exercise      Quality of Life Scores:     Quality of Life - 04/14/16 0646      Quality of Life Scores   Health/Function Pre 18.43 %   Socioeconomic Pre 15.43 %   Psych/Spiritual Pre 29.14 %   Family Pre 22.8 %   GLOBAL Pre 20.73 %      PHQ-9: Recent Review Flowsheet Data    Depression screen [facility] 2/9 04/12/2016   Decreased Interest 0   Down, Depressed, Hopeless 1   PHQ - 2 Score 1       Psychosocial Evaluation and  Intervention:     Psychosocial Evaluation - 07/06/16 1708      Psychosocial Evaluation & Interventions   Interventions Encouraged to exercise with the program and follow exercise prescription;Relaxation education;Stress management education   Comments No needs identified, no further intervention warranted      Psychosocial Re-Evaluation:     Psychosocial Re-Evaluation    [place] Name 06/08/16 1900             Psychosocial Re-Evaluation   Interventions Encouraged to attend Cardiac Rehabilitation for the exercise          Vocational Rehabilitation: Provide vocational rehab assistance to qualifying candidates.   Vocational Rehab Evaluation & Intervention:     Vocational Rehab - 04/06/16 1301      Initial Vocational Rehab Evaluation & Intervention   Assessment shows need for Vocational Rehabilitation No      Education: Education Goals: Education classes will be provided on a weekly basis, covering required topics. Participant will state understanding/return demonstration of topics presented.  Learning Barriers/Preferences:     Learning Barriers/Preferences - 04/06/16 1259      Learning Barriers/Preferences   Learning Barriers Hearing  memory deficits   Learning Preferences Pictoral;Audio  Patient completed the 7th grade      Education Topics: Count Your Pulse:  -Group instruction provided by verbal instruction, demonstration, patient participation and written materials to support subject.  Instructors address importance of being able to find your pulse and how to count your pulse when at home without a heart monitor.  Patients get hands on experience counting their pulse with staff help and individually.   Heart Attack, Angina, and Risk Factor Modification:  -Group instruction provided by verbal instruction, video, and written materials to support subject.  Instructors address signs and symptoms of angina and heart attacks.     Also discuss risk factors for heart disease and how to make changes to improve heart health risk factors.   Functional Fitness:  -Group instruction provided by verbal instruction, demonstration, patient participation, and written materials to support subject.  Instructors address safety measures for doing things around the house.  Discuss how to get up and down off the floor, how to pick things up properly, how to safely get out of a chair without assistance, and balance training. Flowsheet Row CARDIAC REHAB PHASE II EXERCISE from 06/23/2016 in [place]  Date  06/09/16  Instruction Review Code  2- meets goals/outcomes      Meditation and Mindfulness:  -Group instruction provided by verbal instruction, patient participation, and written materials to support subject.  Instructor addresses importance of mindfulness and meditation practice to help reduce stress and improve awareness.  Instructor also leads participants through a meditation exercise.  Flowsheet Row CARDIAC REHAB PHASE II EXERCISE from 06/23/2016 in [place]  Date  05/24/16  Instruction Review Code  2- meets goals/outcomes      Stretching for Flexibility and Mobility:  -Group instruction provided by verbal instruction, patient participation, and written materials to support subject.  Instructors lead participants through series of stretches that are designed to increase flexibility thus improving mobility.  These stretches are additional exercise for major muscle groups that are typically performed during regular warm up and cool down. Flowsheet Row CARDIAC REHAB PHASE II EXERCISE from 06/23/2016 in [place]  Date  06/23/16  Instruction Review Code  2- meets goals/outcomes      Hands Only CPR Anytime:  -Group instruction provided  by verbal instruction, video, patient participation and written materials to support subject.   Instructors co-teach with AHA video for hands only CPR.  Participants get hands on experience with mannequins.   Nutrition I class: Heart Healthy Eating:  -Group instruction provided by PowerPoint slides, verbal discussion, and written materials to support subject matter. The instructor gives an explanation and review of the Therapeutic Lifestyle Changes diet recommendations, which includes a discussion on lipid goals, dietary fat, sodium, fiber, plant stanol/sterol esters, sugar, and the components of a well-balanced, healthy diet.   Nutrition II class: Lifestyle Skills:  -Group instruction provided by PowerPoint slides, verbal discussion, and written materials to support subject matter. The instructor gives an explanation and review of label reading, grocery shopping for heart health, heart healthy recipe modifications, and ways to make healthier choices when eating out.   Diabetes Question & Answer:  -Group instruction provided by PowerPoint slides, verbal discussion, and written materials to support subject matter. The instructor gives an explanation and review of diabetes co-morbidities, pre- and post-prandial blood glucose goals, pre-exercise blood glucose goals, signs, symptoms, and treatment of hypoglycemia and hyperglycemia, and foot care basics.   Diabetes Blitz:  -Group instruction provided by PowerPoint slides, verbal discussion, and written materials to support subject matter. The instructor gives an explanation and review of the physiology behind type 1 and type 2 diabetes, diabetes medications and rational behind using different medications, pre- and post-prandial blood glucose recommendations and Hemoglobin A1c goals, diabetes diet, and exercise including blood glucose guidelines for exercising safely.    Portion Distortion:  -Group instruction provided by PowerPoint slides, verbal discussion, written materials, and food models to support subject matter. The instructor gives an  explanation of serving size versus portion size, changes in portions sizes over the last 20 years, and what consists of a serving from each food group.   Stress Management:  -Group instruction provided by verbal instruction, video, and written materials to support subject matter.  Instructors review role of stress in heart disease and how to cope with stress positively.     Exercising on Your Own:  -Group instruction provided by verbal instruction, power point, and written materials to support subject.  Instructors discuss benefits of exercise, components of exercise, frequency and intensity of exercise, and end points for exercise.  Also discuss use of nitroglycerin and activating EMS.  Review options of places to exercise outside of rehab.  Review guidelines for sex with heart disease. Flowsheet Row CARDIAC REHAB PHASE II EXERCISE from 06/23/2016 in [place]  Date  04/19/16  Instruction Review Code  2- meets goals/outcomes      Cardiac Drugs I:  -Group instruction provided by verbal instruction and written materials to support subject.  Instructor reviews cardiac drug classes: antiplatelets, anticoagulants, beta blockers, and statins.  Instructor discusses reasons, side effects, and lifestyle considerations for each drug class.   Cardiac Drugs II:  -Group instruction provided by verbal instruction and written materials to support subject.  Instructor reviews cardiac drug classes: angiotensin converting enzyme inhibitors (ACE-I), angiotensin II receptor blockers (ARBs), nitrates, and calcium channel blockers.  Instructor discusses reasons, side effects, and lifestyle considerations for each drug class.   Anatomy and Physiology of the Circulatory System:  -Group instruction provided by verbal instruction, video, and written materials to support subject.  Reviews functional anatomy of heart, how it relates to various diagnoses, and what role the heart plays in  the overall system.   Knowledge Questionnaire Score:  Knowledge Questionnaire Score - 04/04/16 1711      Knowledge Questionnaire Score   Pre Score 19/24      Core Components/Risk Factors/Patient Goals at Admission:     Personal Goals and Risk Factors at Admission - 04/07/16 0748      Core Components/Risk Factors/Patient Goals on Admission   Increase Strength and Stamina Yes   Intervention Provide advice, education, support and counseling about physical activity/exercise needs.;Develop an individualized exercise prescription for aerobic and resistive training based on initial evaluation findings, risk stratification, comorbidities and participant's personal goals.   Expected Outcomes Achievement of increased cardiorespiratory fitness and enhanced flexibility, muscular endurance and strength shown through measurements of functional capacity and personal statement of participant.   Personal Goal Other Yes   Personal Goal Patient would like to be able to "run with his dogs". Get back to singing. Increase energy level.   Intervention Provide individualized exercise prescription including aerobic exercise 3-5 days per week and resistance training 2 days per week to improve cardiorespiratory fitness and functional capacity.   Expected Outcomes Participate in leisure activities (ie singing, running with his dogs) with sufficient energy.      Core Components/Risk Factors/Patient Goals Review:      Goals and Risk Factor Review    Row Name 05/08/16 1655 06/07/16 1651 07/03/16 1648         Core Components/Risk Factors/Patient Goals Review   Personal Goals Review Other Other Increase Strength and Stamina;Other     Review Reviewed home exercise guidelines with patient. Pt has bike at home and plans to walk for his home exercise.  Patient is running with his dogs several hours a day, which was one of his personal goals. Pt is back to singing and feels that his energy level is increasing.  Pt states he "can get up and do stuff" now. Patient states he feels good and his energy level has increased. Pt is back to doing projects that he was doing prior to his cardiac event.     Expected Outcomes Begin home exercise program in addition to cardiac rehab to improve cardiorespiratory fitness, so that pt can have energy and stamina to do desired leisure activities. Increase exercise duration to help increase energy and fitness levels. Maintain regular exercise routine to achieve health and fitness goals.        Core Components/Risk Factors/Patient Goals at Discharge (Final Review):      Goals and Risk Factor Review - 07/03/16 1648      Core Components/Risk Factors/Patient Goals Review   Personal Goals Review Increase Strength and Stamina;Other   Review Patient states he feels good and his energy level has increased. Pt is back to doing projects that he was doing prior to his cardiac event.   Expected Outcomes Maintain regular exercise routine to achieve health and fitness goals.      ITP Comments:     ITP Comments    Row Name 04/04/16 1341           ITP Comments Medical Director- Dr. Fransico Him, MD          Comments:  Pt is making expected progress toward personal goals after completing 27 sessions. Pt has decreased his participation in rehab to 2 days a week. Pt family brings him to exercise and he did not wat to inconvience them.  Pt has a humorous outlook on life and his cardiac recovery.  No needs identified. Recommend continued exercise and life style modification education including  stress management  and relaxation techniques to decrease cardiac risk profile.

## 2016-07-07 ENCOUNTER — Encounter (HOSPITAL_COMMUNITY)
Admission: RE | Admit: 2016-07-07 | Discharge: 2016-07-07 | Disposition: A | Discharge status: Home / Self Care | Payer: Medicare Other | Source: Ambulatory Visit | Attending: Cardiovascular Disease | Admitting: Cardiovascular Disease

## 2016-07-07 DIAGNOSIS — Z87891 Personal history of nicotine dependence: Secondary | ICD-10-CM | POA: Diagnosis not present

## 2016-07-07 DIAGNOSIS — K219 Gastro-esophageal reflux disease without esophagitis: Secondary | ICD-10-CM | POA: Diagnosis not present

## 2016-07-07 DIAGNOSIS — Z951 Presence of aortocoronary bypass graft: Secondary | ICD-10-CM

## 2016-07-07 DIAGNOSIS — Z48812 Encounter for surgical aftercare following surgery on the circulatory system: Secondary | ICD-10-CM | POA: Diagnosis not present

## 2016-07-07 DIAGNOSIS — E7800 Pure hypercholesterolemia, unspecified: Secondary | ICD-10-CM | POA: Diagnosis not present

## 2016-07-07 DIAGNOSIS — Z8673 Personal history of transient ischemic attack (TIA), and cerebral infarction without residual deficits: Secondary | ICD-10-CM | POA: Diagnosis not present

## 2016-07-10 ENCOUNTER — Encounter (HOSPITAL_COMMUNITY): Payer: Medicare Other

## 2016-07-12 ENCOUNTER — Encounter (HOSPITAL_COMMUNITY)
Admission: RE | Admit: 2016-07-12 | Discharge: 2016-07-12 | Disposition: A | Discharge status: Home / Self Care | Payer: Medicare Other | Source: Ambulatory Visit | Attending: Cardiovascular Disease | Admitting: Cardiovascular Disease

## 2016-07-12 DIAGNOSIS — E7800 Pure hypercholesterolemia, unspecified: Secondary | ICD-10-CM | POA: Diagnosis not present

## 2016-07-12 DIAGNOSIS — Z8673 Personal history of transient ischemic attack (TIA), and cerebral infarction without residual deficits: Secondary | ICD-10-CM | POA: Diagnosis not present

## 2016-07-12 DIAGNOSIS — K219 Gastro-esophageal reflux disease without esophagitis: Secondary | ICD-10-CM | POA: Diagnosis not present

## 2016-07-12 DIAGNOSIS — Z951 Presence of aortocoronary bypass graft: Secondary | ICD-10-CM

## 2016-07-12 DIAGNOSIS — Z87891 Personal history of nicotine dependence: Secondary | ICD-10-CM | POA: Diagnosis not present

## 2016-07-12 DIAGNOSIS — Z48812 Encounter for surgical aftercare following surgery on the circulatory system: Secondary | ICD-10-CM | POA: Diagnosis not present

## 2016-07-14 ENCOUNTER — Encounter (HOSPITAL_COMMUNITY)
Admission: RE | Admit: 2016-07-14 | Discharge: 2016-07-14 | Disposition: A | Discharge status: Home / Self Care | Payer: Medicare Other | Source: Ambulatory Visit | Attending: Cardiovascular Disease | Admitting: Cardiovascular Disease

## 2016-07-14 DIAGNOSIS — Z951 Presence of aortocoronary bypass graft: Secondary | ICD-10-CM

## 2016-07-17 ENCOUNTER — Encounter (HOSPITAL_COMMUNITY): Payer: Medicare Other

## 2016-07-19 ENCOUNTER — Encounter (HOSPITAL_COMMUNITY)
Admission: RE | Admit: 2016-07-19 | Discharge: 2016-07-19 | Disposition: A | Discharge status: Home / Self Care | Payer: Medicare Other | Source: Ambulatory Visit | Attending: Cardiovascular Disease | Admitting: Cardiovascular Disease

## 2016-07-19 DIAGNOSIS — Z87891 Personal history of nicotine dependence: Secondary | ICD-10-CM | POA: Diagnosis not present

## 2016-07-19 DIAGNOSIS — K219 Gastro-esophageal reflux disease without esophagitis: Secondary | ICD-10-CM | POA: Diagnosis not present

## 2016-07-19 DIAGNOSIS — Z8673 Personal history of transient ischemic attack (TIA), and cerebral infarction without residual deficits: Secondary | ICD-10-CM | POA: Diagnosis not present

## 2016-07-19 DIAGNOSIS — Z951 Presence of aortocoronary bypass graft: Secondary | ICD-10-CM | POA: Diagnosis not present

## 2016-07-19 DIAGNOSIS — E7800 Pure hypercholesterolemia, unspecified: Secondary | ICD-10-CM | POA: Diagnosis not present

## 2016-07-19 DIAGNOSIS — Z48812 Encounter for surgical aftercare following surgery on the circulatory system: Secondary | ICD-10-CM | POA: Diagnosis not present

## 2016-07-21 ENCOUNTER — Encounter (HOSPITAL_COMMUNITY)
Admission: RE | Admit: 2016-07-21 | Discharge: 2016-07-21 | Disposition: A | Discharge status: Home / Self Care | Payer: Medicare Other | Source: Ambulatory Visit | Attending: Cardiovascular Disease | Admitting: Cardiovascular Disease

## 2016-07-21 DIAGNOSIS — Z951 Presence of aortocoronary bypass graft: Secondary | ICD-10-CM | POA: Diagnosis not present

## 2016-07-21 DIAGNOSIS — Z8673 Personal history of transient ischemic attack (TIA), and cerebral infarction without residual deficits: Secondary | ICD-10-CM | POA: Diagnosis not present

## 2016-07-21 DIAGNOSIS — Z48812 Encounter for surgical aftercare following surgery on the circulatory system: Secondary | ICD-10-CM | POA: Diagnosis not present

## 2016-07-21 DIAGNOSIS — K219 Gastro-esophageal reflux disease without esophagitis: Secondary | ICD-10-CM | POA: Diagnosis not present

## 2016-07-21 DIAGNOSIS — E7800 Pure hypercholesterolemia, unspecified: Secondary | ICD-10-CM | POA: Diagnosis not present

## 2016-07-21 DIAGNOSIS — Z87891 Personal history of nicotine dependence: Secondary | ICD-10-CM | POA: Diagnosis not present

## 2016-07-24 ENCOUNTER — Encounter (HOSPITAL_COMMUNITY)
Admission: RE | Admit: 2016-07-24 | Discharge: 2016-07-24 | Disposition: A | Discharge status: Home / Self Care | Payer: Medicare Other | Source: Ambulatory Visit | Attending: Cardiovascular Disease | Admitting: Cardiovascular Disease

## 2016-07-24 DIAGNOSIS — Z951 Presence of aortocoronary bypass graft: Secondary | ICD-10-CM | POA: Diagnosis not present

## 2016-07-24 DIAGNOSIS — Z8673 Personal history of transient ischemic attack (TIA), and cerebral infarction without residual deficits: Secondary | ICD-10-CM | POA: Diagnosis not present

## 2016-07-24 DIAGNOSIS — Z87891 Personal history of nicotine dependence: Secondary | ICD-10-CM | POA: Diagnosis not present

## 2016-07-24 DIAGNOSIS — Z48812 Encounter for surgical aftercare following surgery on the circulatory system: Secondary | ICD-10-CM | POA: Diagnosis not present

## 2016-07-24 DIAGNOSIS — E7800 Pure hypercholesterolemia, unspecified: Secondary | ICD-10-CM | POA: Diagnosis not present

## 2016-07-24 DIAGNOSIS — K219 Gastro-esophageal reflux disease without esophagitis: Secondary | ICD-10-CM | POA: Diagnosis not present

## 2016-07-26 ENCOUNTER — Encounter (HOSPITAL_COMMUNITY): Payer: Medicare Other

## 2016-07-28 ENCOUNTER — Encounter (HOSPITAL_COMMUNITY)
Admission: RE | Admit: 2016-07-28 | Discharge: 2016-07-28 | Disposition: A | Discharge status: Home / Self Care | Payer: Medicare Other | Source: Ambulatory Visit | Attending: Cardiovascular Disease | Admitting: Cardiovascular Disease

## 2016-07-28 DIAGNOSIS — Z951 Presence of aortocoronary bypass graft: Secondary | ICD-10-CM | POA: Diagnosis not present

## 2016-07-31 ENCOUNTER — Encounter (HOSPITAL_COMMUNITY): Payer: Medicare Other

## 2016-08-02 ENCOUNTER — Encounter (HOSPITAL_COMMUNITY)
Admission: RE | Admit: 2016-08-02 | Discharge: 2016-08-02 | Disposition: A | Discharge status: Home / Self Care | Payer: Medicare Other | Source: Ambulatory Visit | Attending: Cardiovascular Disease | Admitting: Cardiovascular Disease

## 2016-08-02 DIAGNOSIS — Z951 Presence of aortocoronary bypass graft: Secondary | ICD-10-CM | POA: Diagnosis not present

## 2016-08-03 NOTE — Progress Notes (Signed)
Cardiac Individual Treatment Plan  Patient Details  Name: Trevor Ward MRN: [id number] Date of Birth: January 03, 1941 Referring Provider:   Flowsheet Row CARDIAC REHAB PHASE II ORIENTATION from 04/04/2016 in [place]  Referring Provider  Croitoru, Dani Gobble, MD      Initial Encounter Date:  [place] from 04/04/2016 in [place]  Date  04/04/16  Referring Provider  Croitoru, Mihai, MD      Visit Diagnosis: S/P CABG x 4  Patient's Home Medications on Admission:  Current Outpatient Prescriptions:  .  allopurinol (ZYLOPRIM) 100 MG tablet, Take 100 mg by mouth daily. , Disp: , Rfl:  .  amitriptyline (ELAVIL) 10 MG tablet, Take 10 mg by mouth at bedtime. , Disp: , Rfl:  .  aspirin EC 81 MG EC tablet, Take 1 tablet (81 mg total) by mouth daily., Disp: , Rfl:  .  colchicine 0.6 MG tablet, , Disp: , Rfl:  .  esomeprazole (NEXIUM) 40 MG capsule, Take 40 mg by mouth daily. , Disp: , Rfl:  .  Glucosamine Sulfate (GLUCOSAMINE RELIEF) 1000 MG TABS, Take 2,000 mg by mouth daily., Disp: , Rfl:  .  metoprolol tartrate (LOPRESSOR) 25 MG tablet, TAKE 1 TABLET BY MOUTH TWICE A DAY, Disp: 60 tablet, Rfl: 11 .  rosuvastatin (CRESTOR) 5 MG tablet, Take 1 tablet (5 mg total) by mouth daily. (Patient taking differently: Take 5 mg by mouth. ONLY TWO TIMES A WEEK), Disp: 30 tablet, Rfl: 3 .  traMADol (ULTRAM) 50 MG tablet, Take 1 tablet (50 mg total) by mouth every 4 (four) hours as needed for moderate pain., Disp: 30 tablet, Rfl: 0 .  vitamin B-12 (CYANOCOBALAMIN) 500 MCG tablet, Take 500 mcg by mouth daily., Disp: , Rfl:   Past Medical History: Past Medical History:  Diagnosis Date  . Arthritis    "fingers" (02/01/2016)  . Depression   . GERD (gastroesophageal reflux disease)   . High cholesterol    "can't take RX; made my legs weak" (02/01/2016)  . History of gout   . History of hiatal hernia   .  Hypertension   . Peripheral vascular disease ([place])    carotid artery stenosis  . Skin cancer of face     "the good kind"  . Stroke [facility]) 2016   denies residual on 02/01/2016    Tobacco Use: History  Smoking Status  . Former Smoker  . Years: 1.50  . Types: Cigarettes  . Start date: 01/28/1956  Smokeless Tobacco  . Never Used    Comment: "quit smoking cigarettes when I was 75 yr old"    Labs: Recent Review Flowsheet Data    Labs for ITP Cardiac and Pulmonary Rehab Latest Ref Rng & Units 02/02/2016 02/02/2016 02/02/2016 02/03/2016 05/16/2016   Cholestrol 125 - 200 mg/dL - - - - 137   LDLCALC <130 mg/dL - - - - 83   HDL >=40 mg/dL - - - - 23(L)   Trlycerides <150 mg/dL - - - - 155(H)   Hemoglobin A1c 4.8 - 5.6 % - - - - -   PHART 7.350 - 7.450 7.326(L) 7.324(L) - - -   PCO2ART 35.0 - 45.0 mmHg 40.2 42.8 - - -   HCO3 20.0 - 24.0 mEq/L 20.9 22.1 - - -   TCO2 0 - 100 mmol/L 22 23 22 23  -   ACIDBASEDEF 0.0 - 2.0 mmol/L 5.0(H) 4.0(H) - - -   O2SAT %  93.0 91.0 - - -      Capillary Blood Glucose: Lab Results  Component Value Date   GLUCAP 103 (H) 04/14/2016   GLUCAP 128 (H) 02/05/2016   GLUCAP 97 02/04/2016   GLUCAP 91 02/04/2016   GLUCAP 117 (H) 02/04/2016     Exercise Target Goals:    Exercise Program Goal: Individual exercise prescription set with THRR, safety & activity barriers. Participant demonstrates ability to understand and report RPE using BORG scale, to self-measure pulse accurately, and to acknowledge the importance of the exercise prescription.  Exercise Prescription Goal: Starting with aerobic activity 30 plus minutes a day, 3 days per week for initial exercise prescription. Provide home exercise prescription and guidelines that participant acknowledges understanding prior to discharge.  Activity Barriers & Risk Stratification:     Activity Barriers & Cardiac Risk Stratification - 04/06/16 1258      Activity Barriers & Cardiac Risk Stratification   Activity  Barriers History of Falls      6 Minute Walk:     6 Minute Walk    Row Name 04/04/16 1706 07/19/16 1150       6 Minute Walk   Phase Initial Discharge    Distance 1495 feet 1472 feet    Distance % Change  - -1.54 %    Walk Time 6 minutes 6 minutes    # of Rest Breaks 0 0    MPH 2.83 2.79    METS 2.77 2.54    RPE 11 11    VO2 Peak 9.71 8.91    Symptoms No No    Resting HR 72 bpm 65 bpm    Resting BP 108/60 118/80    Max Ex. HR 92 bpm 70 bpm    Max Ex. BP 120/70 150/76    2 Minute Post BP 112/70  -       Initial Exercise Prescription:     Initial Exercise Prescription - 04/04/16 1700      Date of Initial Exercise RX and Referring Provider   Date 04/04/16   Referring Provider Croitoru, Dani Gobble, MD     Bike   Level 0.8   Minutes 10   METs 2.84     NuStep   Level 2   Minutes 10   METs 2.2     Track   Laps 8   Minutes 10   METs 2.39     Prescription Details   Frequency (times per week) 3   Duration Progress to 30 minutes of continuous aerobic without signs/symptoms of physical distress     Intensity   THRR 40-80% of Max Heartrate 57-114   Ratings of Perceived Exertion 11-13   Perceived Dyspnea 0-4      Perform Capillary Blood Glucose checks as needed.  Exercise Prescription Changes:     Exercise Prescription Changes    Row Name 04/26/16 1400 05/10/16 1400 06/05/16 0800 07/03/16 1600 07/19/16 1600     Exercise Review   Progression Yes Yes - Yes -     Response to Exercise   Blood Pressure (Admit) 128/82 142/82 128/72 118/82 118/80   Blood Pressure (Exercise) 154/90 140/80 136/70 128/68 150/76   Blood Pressure (Exit) 110/80 124/62 100/68 124/70 122/62   Heart Rate (Admit) 84 bpm 70 bpm 82 bpm 78 bpm 65 bpm   Heart Rate (Exercise) 110 bpm 119 bpm 99 bpm 116 bpm 104 bpm   Heart Rate (Exit) 94 bpm 75 bpm 81 bpm 79 bpm 73 bpm   Rating of  Perceived Exertion (Exercise) 13 12 11 11 11    Symptoms none none none none none   Comments  - Reviewed home  exercise on 05/08/16. Reviewed home exercise on 05/08/16. Reviewed home exercise on 05/08/16. Reviewed home exercise on 05/08/16.   Duration Progress to 30 minutes of continuous aerobic without signs/symptoms of physical distress Progress to 30 minutes of continuous aerobic without signs/symptoms of physical distress Progress to 30 minutes of continuous aerobic without signs/symptoms of physical distress Progress to 30 minutes of continuous aerobic without signs/symptoms of physical distress Progress to 30 minutes of continuous aerobic without signs/symptoms of physical distress   Intensity THRR unchanged THRR unchanged THRR unchanged THRR unchanged THRR unchanged     Progression   Progression Continue to progress workloads to maintain intensity without signs/symptoms of physical distress. Continue to progress workloads to maintain intensity without signs/symptoms of physical distress. Continue to progress workloads to maintain intensity without signs/symptoms of physical distress. Continue to progress workloads to maintain intensity without signs/symptoms of physical distress. Continue to progress workloads to maintain intensity without signs/symptoms of physical distress.   Average METs 2.9 3 2.8 3.1 2.9     Resistance Training   Training Prescription Yes Yes Yes Yes Yes   Weight 3lbs 3lbs 3lbs 3lbs 2lbs   Reps 10-12 10-12 10-12 10-12 10-12     Interval Training   Interval Training No No No No No     Bike   Level 1.2 1.2 1.3 1.3 1.3   Minutes 10 10 10 10 10    METs 3.6 3.58 3.77 3.7 3.72     NuStep   Level 1 1 3 3 3    Minutes 10 10 10 10 10    METs 2.4 2.2 2.3 2.3 2.4     Arm Ergometer   Level  -  - 1 1 1    Minutes  -  - 10 25 25    METs  -  - 2.48 2.44 2.44     Track   Laps 16 13 -  -  -   Minutes 10 10 -  -  -   METs 3.79 3.26 -  -  -     Home Exercise Plan   Plans to continue exercise at  - [place]   Frequency  - Add 2 additional days to program exercise sessions. Add  2 additional days to program exercise sessions. Add 2 additional days to program exercise sessions. Add 2 additional days to program exercise sessions.   [place] Name 08/01/16 1000             Response to Exercise   Blood Pressure (Admit) 134/84       Blood Pressure (Exercise) 142/80       Blood Pressure (Exit) 132/82       Heart Rate (Admit) 73 bpm       Heart Rate (Exercise) 114 bpm       Heart Rate (Exit) 73 bpm       Rating of Perceived Exertion (Exercise) 11       Symptoms none       Comments Reviewed home exercise on 05/08/16.       Duration Progress to 30 minutes of continuous aerobic without signs/symptoms of physical distress       Intensity THRR unchanged         Progression   Progression Continue to progress workloads to maintain intensity without signs/symptoms of physical distress.       Average METs  2.8         Resistance Training   Training Prescription Yes       Weight 3lbs       Reps 10-12         Interval Training   Interval Training No         Bike   Level 1.3       Minutes 10       METs 3.69         NuStep   Level 4       Minutes 10       METs 2.3         Arm Ergometer   Level 1       Minutes 25       METs 2.44         Home Exercise Plan   Plans to continue exercise at Home       Frequency Add 2 additional days to program exercise sessions.          Exercise Comments:     Exercise Comments    Row Name 04/26/16 1447 05/08/16 1655 06/07/16 1650 07/03/16 1647 08/02/16 1704   Exercise Comments Patient off to a good start with exercise. Reviewed home exercise guidelines with patient and pt's wife. Patient is riding his staionary bike 8-10 minutes daily. Encouraged him to increase duration to 15 minutes and will progress from there. Making steady progress with exercise. Reviewed METs and goals. Pt is responding well to exercise prescription will continue to monitor pt's fitness levels and exercise capacity      Discharge Exercise Prescription  (Final Exercise Prescription Changes):     Exercise Prescription Changes - 08/01/16 1000      Response to Exercise   Blood Pressure (Admit) 134/84   Blood Pressure (Exercise) 142/80   Blood Pressure (Exit) 132/82   Heart Rate (Admit) 73 bpm   Heart Rate (Exercise) 114 bpm   Heart Rate (Exit) 73 bpm   Rating of Perceived Exertion (Exercise) 11   Symptoms none   Comments Reviewed home exercise on 05/08/16.   Duration Progress to 30 minutes of continuous aerobic without signs/symptoms of physical distress   Intensity THRR unchanged     Progression   Progression Continue to progress workloads to maintain intensity without signs/symptoms of physical distress.   Average METs 2.8     Resistance Training   Training Prescription Yes   Weight 3lbs   Reps 10-12     Interval Training   Interval Training No     Bike   Level 1.3   Minutes 10   METs 3.69     NuStep   Level 4   Minutes 10   METs 2.3     Arm Ergometer   Level 1   Minutes 25   METs 2.44     Home Exercise Plan   Plans to continue exercise at Home   Frequency Add 2 additional days to program exercise sessions.      Nutrition:  Target Goals: Understanding of nutrition guidelines, daily intake of sodium 1500mg , cholesterol 200mg , calories 30% from fat and 7% or less from saturated fats, daily to have 5 or more servings of fruits and vegetables.  Biometrics:     Pre Biometrics - 04/04/16 1709      Pre Biometrics   Waist Circumference 38.5 inches   Hip Circumference 40 inches   Waist to Hip Ratio 0.96 %   Triceps Skinfold 10 mm   %  Body Fat 25.1 %   Grip Strength 38 kg   Flexibility 11 in   Single Leg Stand 5.84 seconds       Nutrition Therapy Plan and Nutrition Goals:     Nutrition Therapy & Goals - 05/01/16 1219      Nutrition Therapy   Diet Therapeutic Lifestyle Changes     Personal Nutrition Goals   Personal Goal #1 Pt to maintain wt while in Cardiac Rehab   Personal Goal #2 Pt to  understand the basics of a Carb Modified, Diabetic diet     Intervention Plan   Intervention Prescribe, educate and counsel regarding individualized specific dietary modifications aiming towards targeted core components such as weight, hypertension, lipid management, diabetes, heart failure and other comorbidities.;Nutrition handout(s) given to patient.  Pre-diabetes handout given   Expected Outcomes Short Term Goal: Understand basic principles of dietary content, such as calories, fat, sodium, cholesterol and nutrients.;Long Term Goal: Adherence to prescribed nutrition plan.      Nutrition Discharge: Nutrition Scores:     Nutrition Assessments - 05/01/16 1219      MEDFICTS Scores   Pre Score 47      Nutrition Goals Re-Evaluation:   Psychosocial: Target Goals: Acknowledge presence or absence of depression, maximize coping skills, provide positive support system. Participant is able to verbalize types and ability to use techniques and skills needed for reducing stress and depression.  Initial Review & Psychosocial Screening:     Initial Psych Review & Screening - 04/06/16 [place]? Yes     Barriers   Psychosocial barriers to participate in program The patient should benefit from training in stress management and relaxation.     Screening Interventions   Interventions Encouraged to exercise      Quality of Life Scores:     Quality of Life - 04/14/16 0646      Quality of Life Scores   Health/Function Pre 18.43 %   Socioeconomic Pre 15.43 %   Psych/Spiritual Pre 29.14 %   Family Pre 22.8 %   GLOBAL Pre 20.73 %      PHQ-9: Recent Review Flowsheet Data    Depression screen [facility] 2/9 04/12/2016   Decreased Interest 0   Down, Depressed, Hopeless 1   PHQ - 2 Score 1      Psychosocial Evaluation and Intervention:     Psychosocial Evaluation - 08/03/16 1452      Psychosocial Evaluation & Interventions   Interventions  Encouraged to exercise with the program and follow exercise prescription;Relaxation education;Stress management education   Comments No needs identified, no further intervention warranted     Discharge Psychosocial Assessment & Intervention   Discharge Continue support measures as needed      Psychosocial Re-Evaluation:     Psychosocial Re-Evaluation    [place] Name 06/08/16 1900 08/03/16 1452           Psychosocial Re-Evaluation   Interventions Encouraged to attend Cardiac Rehabilitation for the exercise Encouraged to attend Cardiac Rehabilitation for the exercise         Vocational Rehabilitation: Provide vocational rehab assistance to qualifying candidates.   Vocational Rehab Evaluation & Intervention:     Vocational Rehab - 04/06/16 1301      Initial Vocational Rehab Evaluation & Intervention   Assessment shows need for Vocational Rehabilitation No      Education: Education Goals: Education classes will be provided on a weekly basis, covering required topics. Participant  will state understanding/return demonstration of topics presented.  Learning Barriers/Preferences:     Learning Barriers/Preferences - 04/06/16 1259      Learning Barriers/Preferences   Learning Barriers Hearing  memory deficits   Learning Preferences Pictoral;Audio  Patient completed the 7th grade      Education Topics: Count Your Pulse:  -Group instruction provided by verbal instruction, demonstration, patient participation and written materials to support subject.  Instructors address importance of being able to find your pulse and how to count your pulse when at home without a heart monitor.  Patients get hands on experience counting their pulse with staff help and individually. Flowsheet Row CARDIAC REHAB PHASE II EXERCISE from 08/02/2016 in [place]  Date  07/28/16  Educator  Andi Hence, RN  Instruction Review Code  2- meets goals/outcomes      Heart  Attack, Angina, and Risk Factor Modification:  -Group instruction provided by verbal instruction, video, and written materials to support subject.  Instructors address signs and symptoms of angina and heart attacks.    Also discuss risk factors for heart disease and how to make changes to improve heart health risk factors.   Functional Fitness:  -Group instruction provided by verbal instruction, demonstration, patient participation, and written materials to support subject.  Instructors address safety measures for doing things around the house.  Discuss how to get up and down off the floor, how to pick things up properly, how to safely get out of a chair without assistance, and balance training. Flowsheet Row CARDIAC REHAB PHASE II EXERCISE from 08/02/2016 in [place]  Date  07/14/16  Educator  Seward Carol  Instruction Review Code  R- Review/reinforce      Meditation and Mindfulness:  -Group instruction provided by verbal instruction, patient participation, and written materials to support subject.  Instructor addresses importance of mindfulness and meditation practice to help reduce stress and improve awareness.  Instructor also leads participants through a meditation exercise.  Flowsheet Row CARDIAC REHAB PHASE II EXERCISE from 08/02/2016 in [place]  Date  05/24/16  Instruction Review Code  2- meets goals/outcomes      Stretching for Flexibility and Mobility:  -Group instruction provided by verbal instruction, patient participation, and written materials to support subject.  Instructors lead participants through series of stretches that are designed to increase flexibility thus improving mobility.  These stretches are additional exercise for major muscle groups that are typically performed during regular warm up and cool down. Flowsheet Row CARDIAC REHAB PHASE II EXERCISE from 08/02/2016 in [place]  Date  07/21/16  Educator  Seward Carol  Instruction Review Code  R- Review/reinforce      Hands Only CPR Anytime:  -Group instruction provided by verbal instruction, video, patient participation and written materials to support subject.  Instructors co-teach with AHA video for hands only CPR.  Participants get hands on experience with mannequins.   Nutrition I class: Heart Healthy Eating:  -Group instruction provided by PowerPoint slides, verbal discussion, and written materials to support subject matter. The instructor gives an explanation and review of the Therapeutic Lifestyle Changes diet recommendations, which includes a discussion on lipid goals, dietary fat, sodium, fiber, plant stanol/sterol esters, sugar, and the components of a well-balanced, healthy diet.   Nutrition II class: Lifestyle Skills:  -Group instruction provided by PowerPoint slides, verbal discussion, and written materials to support subject matter. The instructor gives an explanation  and review of label reading, grocery shopping for heart health, heart healthy recipe modifications, and ways to make healthier choices when eating out.   Diabetes Question & Answer:  -Group instruction provided by PowerPoint slides, verbal discussion, and written materials to support subject matter. The instructor gives an explanation and review of diabetes co-morbidities, pre- and post-prandial blood glucose goals, pre-exercise blood glucose goals, signs, symptoms, and treatment of hypoglycemia and hyperglycemia, and foot care basics.   Diabetes Blitz:  -Group instruction provided by PowerPoint slides, verbal discussion, and written materials to support subject matter. The instructor gives an explanation and review of the physiology behind type 1 and type 2 diabetes, diabetes medications and rational behind using different medications, pre- and post-prandial blood glucose recommendations and Hemoglobin A1c goals,  diabetes diet, and exercise including blood glucose guidelines for exercising safely.    Portion Distortion:  -Group instruction provided by PowerPoint slides, verbal discussion, written materials, and food models to support subject matter. The instructor gives an explanation of serving size versus portion size, changes in portions sizes over the last 20 years, and what consists of a serving from each food group.   Stress Management:  -Group instruction provided by verbal instruction, video, and written materials to support subject matter.  Instructors review role of stress in heart disease and how to cope with stress positively.     Exercising on Your Own:  -Group instruction provided by verbal instruction, power point, and written materials to support subject.  Instructors discuss benefits of exercise, components of exercise, frequency and intensity of exercise, and end points for exercise.  Also discuss use of nitroglycerin and activating EMS.  Review options of places to exercise outside of rehab.  Review guidelines for sex with heart disease. Flowsheet Row CARDIAC REHAB PHASE II EXERCISE from 06/23/2016 in [place]  Date  04/19/16  Instruction Review Code  2- meets goals/outcomes      Cardiac Drugs I:  -Group instruction provided by verbal instruction and written materials to support subject.  Instructor reviews cardiac drug classes: antiplatelets, anticoagulants, beta blockers, and statins.  Instructor discusses reasons, side effects, and lifestyle considerations for each drug class.   Cardiac Drugs II:  -Group instruction provided by verbal instruction and written materials to support subject.  Instructor reviews cardiac drug classes: angiotensin converting enzyme inhibitors (ACE-I), angiotensin II receptor blockers (ARBs), nitrates, and calcium channel blockers.  Instructor discusses reasons, side effects, and lifestyle considerations for each drug  class. Flowsheet Row CARDIAC REHAB PHASE II EXERCISE from 08/02/2016 in [place]  Date  08/02/16  Instruction Review Code  2- meets goals/outcomes      Anatomy and Physiology of the Circulatory System:  -Group instruction provided by verbal instruction, video, and written materials to support subject.  Reviews functional anatomy of heart, how it relates to various diagnoses, and what role the heart plays in the overall system.   Knowledge Questionnaire Score:     Knowledge Questionnaire Score - 04/04/16 1711      Knowledge Questionnaire Score   Pre Score 19/24      Core Components/Risk Factors/Patient Goals at Admission:     Personal Goals and Risk Factors at Admission - 04/07/16 0748      Core Components/Risk Factors/Patient Goals on Admission   Increase Strength and Stamina Yes   Intervention Provide advice, education, support and counseling about physical activity/exercise needs.;Develop an individualized exercise prescription for aerobic and resistive training based on initial  evaluation findings, risk stratification, comorbidities and participant's personal goals.   Expected Outcomes Achievement of increased cardiorespiratory fitness and enhanced flexibility, muscular endurance and strength shown through measurements of functional capacity and personal statement of participant.   Personal Goal Other Yes   Personal Goal Patient would like to be able to "run with his dogs". Get back to singing. Increase energy level.   Intervention Provide individualized exercise prescription including aerobic exercise 3-5 days per week and resistance training 2 days per week to improve cardiorespiratory fitness and functional capacity.   Expected Outcomes Participate in leisure activities (ie singing, running with his dogs) with sufficient energy.      Core Components/Risk Factors/Patient Goals Review:      Goals and Risk Factor Review    Row Name 05/08/16  1655 06/07/16 1651 07/03/16 1648 08/02/16 1706       Core Components/Risk Factors/Patient Goals Review   Personal Goals Review Other Other Increase Strength and Stamina;Other Other;Increase Strength and Stamina    Review Reviewed home exercise guidelines with patient. Pt has bike at home and plans to walk for his home exercise.  Patient is running with his dogs several hours a day, which was one of his personal goals. Pt is back to singing and feels that his energy level is increasing. Pt states he "can get up and do stuff" now. Patient states he feels good and his energy level has increased. Pt is back to doing projects that he was doing prior to his cardiac event. Pt feels stronger and increased energy levels.     Expected Outcomes Begin home exercise program in addition to cardiac rehab to improve cardiorespiratory fitness, so that pt can have energy and stamina to do desired leisure activities. Increase exercise duration to help increase energy and fitness levels. Maintain regular exercise routine to achieve health and fitness goals. Pt will maintain fitness levels or progress exercise intensity based on perceived exertion and HR/BP       Core Components/Risk Factors/Patient Goals at Discharge (Final Review):      Goals and Risk Factor Review - 08/02/16 1706      Core Components/Risk Factors/Patient Goals Review   Personal Goals Review Other;Increase Strength and Stamina   Review Pt feels stronger and increased energy levels.    Expected Outcomes Pt will maintain fitness levels or progress exercise intensity based on perceived exertion and HR/BP      ITP Comments:     ITP Comments    Row Name 04/04/16 1341           ITP Comments Medical Director- Dr. Fransico Him, MD          Comments:  Pt is making expected progress toward personal goals after completing  35 sessions. Pt will graduate from the cardiac rehab on tomorrow.  Psychosocial assessment - no needs identified, pt  demonstrates healthy and positive outlook on life. Recommend continued exercise and life style modification education including  stress management and relaxation techniques to decrease cardiac risk profile. Cherre Huger, BSN

## 2016-08-04 ENCOUNTER — Encounter (HOSPITAL_COMMUNITY)
Admission: RE | Admit: 2016-08-04 | Discharge: 2016-08-04 | Disposition: A | Discharge status: Home / Self Care | Payer: Medicare Other | Source: Ambulatory Visit | Attending: Cardiovascular Disease | Admitting: Cardiovascular Disease

## 2016-08-04 VITALS — Wt 200.6 lb

## 2016-08-04 DIAGNOSIS — Z951 Presence of aortocoronary bypass graft: Secondary | ICD-10-CM | POA: Diagnosis not present

## 2016-08-04 NOTE — Progress Notes (Signed)
Discharge Summary  Patient Details  Name: Trevor Ward MRN: [id number] Date of Birth: March 08, 1941 Referring Provider:   Flowsheet Row CARDIAC REHAB PHASE II ORIENTATION from 04/04/2016 in [place]  Referring Provider  Croitoru, Dani Gobble, MD       Number of Visits: 45  Reason for Discharge:  Patient reached a stable level of exercise. Patient independent in their exercise.  Smoking History:  History  Smoking Status  . Former Smoker  . Years: 1.50  . Types: Cigarettes  . Start date: 01/28/1956  Smokeless Tobacco  . Never Used    Comment: "quit smoking cigarettes when I was 75 yr old"    Diagnosis:  S/P CABG x 4  ADL UCSD:   Initial Exercise Prescription:     Initial Exercise Prescription - 04/04/16 1700      Date of Initial Exercise RX and Referring Provider   Date 04/04/16   Referring Provider Croitoru, Dani Gobble, MD     Bike   Level 0.8   Minutes 10   METs 2.84     NuStep   Level 2   Minutes 10   METs 2.2     Track   Laps 8   Minutes 10   METs 2.39     Prescription Details   Frequency (times per week) 3   Duration Progress to 30 minutes of continuous aerobic without signs/symptoms of physical distress     Intensity   THRR 40-80% of Max Heartrate 57-114   Ratings of Perceived Exertion 11-13   Perceived Dyspnea 0-4      Discharge Exercise Prescription (Final Exercise Prescription Changes):     Exercise Prescription Changes - 08/22/16 1100      Exercise Review   Progression Yes     Response to Exercise   Blood Pressure (Admit) 132/86   Blood Pressure (Exercise) 144/70   Blood Pressure (Exit) 124/78   Heart Rate (Admit) 88 bpm   Heart Rate (Exercise) 106 bpm   Heart Rate (Exit) 87 bpm   Rating of Perceived Exertion (Exercise) 13   Symptoms none   Comments Reviewed home exercise on 05/08/16.   Duration Progress to 30 minutes of continuous aerobic without signs/symptoms of physical distress   Intensity THRR  unchanged     Progression   Progression Continue to progress workloads to maintain intensity without signs/symptoms of physical distress.   Average METs 3     Resistance Training   Training Prescription Yes   Weight 3lbs   Reps 10-12     Interval Training   Interval Training No     Bike   Level 1.3   Minutes 10   METs 3.75     NuStep   Level 4   Minutes 10   METs 2.8     Arm Ergometer   Level 1   Minutes 25   METs 2.47     Home Exercise Plan   Plans to continue exercise at Home   Frequency Add 2 additional days to program exercise sessions.      Functional Capacity:     6 Minute Walk    Row Name 04/04/16 1706 07/19/16 1150       6 Minute Walk   Phase Initial Discharge    Distance 1495 feet 1472 feet    Distance % Change  - -1.54 %    Walk Time 6 minutes 6 minutes    # of Rest Breaks 0 0    MPH  2.83 2.79    METS 2.77 2.54    RPE 11 11    VO2 Peak 9.71 8.91    Symptoms No No    Resting HR 72 bpm 65 bpm    Resting BP 108/60 118/80    Max Ex. HR 92 bpm 70 bpm    Max Ex. BP 120/70 150/76    2 Minute Post BP 112/70  -       Psychological, QOL, Others - Outcomes: PHQ 2/9: Depression screen PHQ 2/9 04/12/2016  Decreased Interest 0  Down, Depressed, Hopeless 1  PHQ - 2 Score 1    Quality of Life:     Quality of Life - 08/04/16 1153      Quality of Life Scores   Health/Function Pre 18.43 %   Health/Function Post 22.8 %   Health/Function % Change 23.71 %   Socioeconomic Pre 15.43 %   Socioeconomic Post 16.71 %   Socioeconomic % Change  8.3 %   Psych/Spiritual Pre 29.14 %   Psych/Spiritual Post 22.29 %   Psych/Spiritual % Change -23.51 %   Family Pre 22.8 %   Family Post 24 %   Family % Change 5.26 %   GLOBAL Pre 20.73 %   GLOBAL Post 21.62 %   GLOBAL % Change 4.29 %      Personal Goals: Goals established at orientation with interventions provided to work toward goal.     Personal Goals and Risk Factors at Admission - 04/07/16 0748       Core Components/Risk Factors/Patient Goals on Admission   Increase Strength and Stamina Yes   Intervention Provide advice, education, support and counseling about physical activity/exercise needs.;Develop an individualized exercise prescription for aerobic and resistive training based on initial evaluation findings, risk stratification, comorbidities and participant's personal goals.   Expected Outcomes Achievement of increased cardiorespiratory fitness and enhanced flexibility, muscular endurance and strength shown through measurements of functional capacity and personal statement of participant.   Personal Goal Other Yes   Personal Goal Patient would like to be able to "run with his dogs". Get back to singing. Increase energy level.   Intervention Provide individualized exercise prescription including aerobic exercise 3-5 days per week and resistance training 2 days per week to improve cardiorespiratory fitness and functional capacity.   Expected Outcomes Participate in leisure activities (ie singing, running with his dogs) with sufficient energy.       Personal Goals Discharge:     Goals and Risk Factor Review    Row Name 05/08/16 1655 06/07/16 1651 07/03/16 1648 08/02/16 1706       Core Components/Risk Factors/Patient Goals Review   Personal Goals Review Other Other Increase Strength and Stamina;Other Other;Increase Strength and Stamina    Review Reviewed home exercise guidelines with patient. Pt has bike at home and plans to walk for his home exercise.  Patient is running with his dogs several hours a day, which was one of his personal goals. Pt is back to singing and feels that his energy level is increasing. Pt states he "can get up and do stuff" now. Patient states he feels good and his energy level has increased. Pt is back to doing projects that he was doing prior to his cardiac event. Pt feels stronger and increased energy levels.     Expected Outcomes Begin home exercise  program in addition to cardiac rehab to improve cardiorespiratory fitness, so that pt can have energy and stamina to do desired leisure activities. Increase exercise duration  to help increase energy and fitness levels. Maintain regular exercise routine to achieve health and fitness goals. Pt will maintain fitness levels or progress exercise intensity based on perceived exertion and HR/BP       Nutrition & Weight - Outcomes:     Pre Biometrics - 04/04/16 1709      Pre Biometrics   Waist Circumference 38.5 inches   Hip Circumference 40 inches   Waist to Hip Ratio 0.96 %   Triceps Skinfold 10 mm   % Body Fat 25.1 %   Grip Strength 38 kg   Flexibility 11 in   Single Leg Stand 5.84 seconds         Post Biometrics - 08/22/16 1137       Post  Biometrics   Weight 200 lb 9.9 oz (91 kg)   Waist Circumference 40.25 inches   Hip Circumference 41.75 inches   Waist to Hip Ratio 0.96 %   Triceps Skinfold 10 mm   % Body Fat 26.8 %   Grip Strength 44.5 kg   Flexibility 12 in   Single Leg Stand 10.12 seconds      Nutrition:     Nutrition Therapy & Goals - 05/01/16 1219      Nutrition Therapy   Diet Therapeutic Lifestyle Changes     Personal Nutrition Goals   Personal Goal #1 Pt to maintain wt while in Cardiac Rehab   Personal Goal #2 Pt to understand the basics of a Carb Modified, Diabetic diet     Intervention Plan   Intervention Prescribe, educate and counsel regarding individualized specific dietary modifications aiming towards targeted core components such as weight, hypertension, lipid management, diabetes, heart failure and other comorbidities.;Nutrition handout(s) given to patient.  Pre-diabetes handout given   Expected Outcomes Short Term Goal: Understand basic principles of dietary content, such as calories, fat, sodium, cholesterol and nutrients.;Long Term Goal: Adherence to prescribed nutrition plan.      Nutrition Discharge:     Nutrition Assessments - 08/30/16  0748      MEDFICTS Scores   Pre Score 47   Post Score 45   Score Difference -2      Education Questionnaire Score:     Knowledge Questionnaire Score - 08/04/16 1153      Knowledge Questionnaire Score   Post Score 24/24      Goals reviewed with patient. Pt graduated from cardiac rehab program today with completion of 36 exercise sessions in Phase II. Pt maintained good attendance scaled back to 2 x week due to having to depend on family members to bring him.  Pt  progressed nicely during his participation in rehab as evidenced by maintaining his MET level. Pt seldomly attended education sessions. Unable to reconcile  Medication list.  Pt is a poor historian for the medications he takes.  Pt wife fills out his pillbox for him.  Repeat  PHQ score- 0 .  Pt has made some  lifestyle changes and should be commended for his success. Pt feels at his age of 29 he should live life as he pleases.  Pt family supports him in his lifestyle choices.  Pt feels he has made progress toward his goals during cardiac rehab.   Pt plans to continue exercise with walking and using stationary bike. Anala Whisenant Elana Alm, BSN .

## 2016-08-07 ENCOUNTER — Encounter (HOSPITAL_COMMUNITY): Payer: Medicare Other

## 2016-08-09 ENCOUNTER — Encounter (HOSPITAL_COMMUNITY): Payer: Medicare Other

## 2016-08-11 ENCOUNTER — Encounter (HOSPITAL_COMMUNITY): Payer: Medicare Other

## 2016-08-14 ENCOUNTER — Encounter (HOSPITAL_COMMUNITY): Payer: Medicare Other

## 2016-08-29 NOTE — Addendum Note (Signed)
Addended by: Lianne Cure A on: 08/29/2016 09:08 AM   Modules accepted: Orders

## 2016-08-30 NOTE — Addendum Note (Signed)
Encounter addended by: Jewel Baize, RD on: 08/30/2016  7:53 AM<BR>    Actions taken: Flowsheet data copied forward, Visit Navigator Flowsheet section accepted

## 2016-09-01 ENCOUNTER — Encounter (HOSPITAL_COMMUNITY): Payer: Self-pay | Admitting: *Deleted

## 2016-09-07 DIAGNOSIS — L570 Actinic keratosis: Secondary | ICD-10-CM | POA: Diagnosis not present

## 2016-09-27 DIAGNOSIS — E785 Hyperlipidemia, unspecified: Secondary | ICD-10-CM | POA: Diagnosis not present

## 2016-09-28 LAB — LIPID PANEL
Cholesterol: 146 mg/dL (ref <200)
HDL: 22 mg/dL — AB (ref >40)
LDL CALC: 98 mg/dL (ref <100)
Total CHOL/HDL Ratio: 6.6 Ratio — ABNORMAL HIGH (ref <5.0)
Triglycerides: 129 mg/dL (ref <150)
VLDL: 26 mg/dL (ref <30)

## 2016-10-10 ENCOUNTER — Telehealth: Payer: Self-pay | Admitting: Cardiovascular Disease

## 2016-10-10 NOTE — Telephone Encounter (Signed)
Spoke to daughter [facility]) after discussing w [place], and advised on labwork and recommendations per Dr C (no change to dosing of crestor or need for labwork recheck) Caller voiced understanding and thanks.

## 2016-10-10 NOTE — Telephone Encounter (Signed)
New Message   Pt daughter said she was returning a phone call regarding pt lab work.

## 2016-10-30 ENCOUNTER — Other Ambulatory Visit: Payer: Self-pay | Admitting: Family Medicine

## 2016-10-30 ENCOUNTER — Ambulatory Visit
Admission: RE | Admit: 2016-10-30 | Discharge: 2016-10-30 | Disposition: A | Discharge status: Home / Self Care | Payer: Medicare Other | Source: Ambulatory Visit | Attending: Family Medicine | Admitting: Family Medicine

## 2016-10-30 DIAGNOSIS — R0989 Other specified symptoms and signs involving the circulatory and respiratory systems: Secondary | ICD-10-CM

## 2016-10-30 DIAGNOSIS — F411 Generalized anxiety disorder: Secondary | ICD-10-CM | POA: Diagnosis not present

## 2016-10-30 DIAGNOSIS — E782 Mixed hyperlipidemia: Secondary | ICD-10-CM | POA: Diagnosis not present

## 2016-10-30 DIAGNOSIS — I712 Thoracic aortic aneurysm, without rupture: Secondary | ICD-10-CM | POA: Diagnosis not present

## 2016-10-30 DIAGNOSIS — N529 Male erectile dysfunction, unspecified: Secondary | ICD-10-CM | POA: Diagnosis not present

## 2016-10-30 DIAGNOSIS — I779 Disorder of arteries and arterioles, unspecified: Secondary | ICD-10-CM | POA: Diagnosis not present

## 2016-10-30 DIAGNOSIS — I700 Atherosclerosis of aorta: Secondary | ICD-10-CM | POA: Diagnosis not present

## 2016-10-30 DIAGNOSIS — I119 Hypertensive heart disease without heart failure: Secondary | ICD-10-CM | POA: Diagnosis not present

## 2016-10-30 DIAGNOSIS — R0689 Other abnormalities of breathing: Secondary | ICD-10-CM | POA: Diagnosis not present

## 2016-11-02 ENCOUNTER — Encounter: Payer: Self-pay | Admitting: Cardiology

## 2016-11-07 DIAGNOSIS — R7301 Impaired fasting glucose: Secondary | ICD-10-CM | POA: Diagnosis not present

## 2016-12-28 ENCOUNTER — Telehealth: Payer: Self-pay | Admitting: Cardiovascular Disease

## 2016-12-28 NOTE — Telephone Encounter (Signed)
Returned call to patient wife (ok per DPR).  Wife states patient has been experiencing chest pain with nausea and jaw pain x 2-3 week. Reports episode last night of nausea, and episode this AM with jaw numbness.    Spoke to patient-patient states he had episode a few weeks ago and he took rolaids and drank a coke and "belched" and the pain went away.  Denies pain is worse with movement but describes it as sore when he touches "where they cut me". Is not associated with exertion, SOB, diaphoresis or vomiting.  Reports nausea intermittently "once in a while" but does not associate it with the chest pain, does not always occur at the same time.  Reports this morning he had an episode where it felt like he had a "toothache" but denies chest pain at this time.  Wife and daughter states that they were told he was having CP with toothache and nausea and were concerned.  Has not taken NTG.  Wife reports patient also has fallen a couple times in the last few week, fell off the bed and the couch, denies injury.  Wife and daughter concerned as they were told a different story and his symptoms are similar to the past when he had surgery. (Hx CABG x 4 01/2016)   Patient denies chest pain, nausea, jaw pain, SOB at current.  Appointment made for Monday 4/9 with Almyra Deforest [facility] for further evaluation.  Advised if symptoms reoccur to proceed to ER for evaluation.  Patient, daughter, and wife aware and verbalized understanding.    Routed to MD for further recommendations.

## 2016-12-28 NOTE — Telephone Encounter (Signed)
New message      Pt c/o of Chest Pain: STAT if CP now or developed within 24 hours  1. Are you having CP right now?  no  2. Are you experiencing any other symptoms (ex. SOB, nausea, vomiting, sweating)?  Nausea, jaw pain, always has sob  3. How long have you been experiencing CP?  Last night and this am  4. Is your CP continuous or coming and going?  Comes and goes  5. Have you taken Nitroglycerin?  no?

## 2016-12-28 NOTE — Telephone Encounter (Signed)
Wife made aware, verbalized understanding.

## 2016-12-28 NOTE — Telephone Encounter (Signed)
Agree with plan. Go to ED for unrelenting discomfort >30 minutes. MCr

## 2017-01-01 ENCOUNTER — Ambulatory Visit (INDEPENDENT_AMBULATORY_CARE_PROVIDER_SITE_OTHER): Payer: Medicare Other | Admitting: Physician Assistant

## 2017-01-01 ENCOUNTER — Telehealth: Payer: Self-pay | Admitting: Physician Assistant

## 2017-01-01 VITALS — BP 130/74 | HR 73 | Ht 68.0 in | Wt 202.4 lb

## 2017-01-01 DIAGNOSIS — I10 Essential (primary) hypertension: Secondary | ICD-10-CM

## 2017-01-01 DIAGNOSIS — I739 Peripheral vascular disease, unspecified: Secondary | ICD-10-CM

## 2017-01-01 DIAGNOSIS — I779 Disorder of arteries and arterioles, unspecified: Secondary | ICD-10-CM

## 2017-01-01 DIAGNOSIS — Z8673 Personal history of transient ischemic attack (TIA), and cerebral infarction without residual deficits: Secondary | ICD-10-CM

## 2017-01-01 DIAGNOSIS — R079 Chest pain, unspecified: Secondary | ICD-10-CM | POA: Diagnosis not present

## 2017-01-01 DIAGNOSIS — E785 Hyperlipidemia, unspecified: Secondary | ICD-10-CM | POA: Diagnosis not present

## 2017-01-01 MED ORDER — ISOSORBIDE MONONITRATE ER 30 MG PO TB24: 15.0000 mg | ORAL_TABLET | Freq: Every day | ORAL | 3 refills | Status: DC

## 2017-01-01 MED ORDER — PANTOPRAZOLE SODIUM 40 MG PO TBEC: 40.0000 mg | DELAYED_RELEASE_TABLET | Freq: Every day | ORAL | 3 refills | Status: AC

## 2017-01-01 NOTE — Telephone Encounter (Signed)
New Message    Pt daughter would like to know why his medication was changed? Is the pt having a problem with his heart or is it just a stomach issue? Why was he put on medicine with Nitro in it?

## 2017-01-01 NOTE — Progress Notes (Signed)
Cardiology Office Note    Date:  01/02/2017   ID:  Trevor Ward, DOB Jul 02, 1941, MRN [id number]  PCP:  Mayra Neer, MD  Cardiologist:  Dr. Sallyanne Kuster  Chief Complaint  Patient presents with  . Chest Pain    seen for Dr. Sallyanne Kuster    History of Present Illness:  Trevor Ward is a 76 y.o. male with PMH of HTN, carotid artery stenosis, h/o CVA, gout and HLD. He underwent 4v CABG (LIMA to LAD, sequential SVG to first and second ramus intermediate, SVG to PDA) in May 2017 for multivessel CAD following abnormal Myoview. He had a slow recovery postoperatively, but had no real complications. He did have presyncope on hospital follow up in June 2017, his Cozaar was discontinued due to hypotension. He was last seen in July 2017, at which time he was doing well, 10 month follow-up was recommended.  Trevor Ward presents today for evaluation of burning sensation in the chest radiating to the jaw. He think it is acid reflux, he says he had similar to his symptom years ago. It does not have clear association with exertion. The last episode was last Thursday, he was ready to go to bed at the time. He drank a glass of milk before the symptom went away. Today on EKG, and there is no sign of ischemia. Given the lack of exertional component, possible options including medical therapy versus stress test. Patient is very hesitant to go through a stress test at this time. Therefore, I recommended medical therapy. I will discontinue Nexium and switch to Protonix 40 mg daily. We will add Imdur 15 mg daily for antianginal purposes. I will see the patient back in 2 weeks to see if his symptoms improved. If it does improve, we will hold off on additional ischemic workup.   Of note, patient's last lipid testing in January 2018 continued to show his LDL is uncontrolled. LDL goal is less than 70. He could not tolerate the statins, he is currently only on 5 mg daily of Crestor. We did consider PCSK 9 inhibitor as an  alternative. The cost is an issue in this case since patient has Medicare.     Past Medical History:  Diagnosis Date  . Arthritis    "fingers" (02/01/2016)  . Depression   . GERD (gastroesophageal reflux disease)   . High cholesterol    "can't take RX; made my legs weak" (02/01/2016)  . History of gout   . History of hiatal hernia   . Hypertension   . Peripheral vascular disease ([place])    carotid artery stenosis  . Skin cancer of face     "the good kind"  . Stroke [facility]) 2016   denies residual on 02/01/2016    Past Surgical History:  Procedure Laterality Date  . CARDIAC CATHETERIZATION N/A 02/01/2016   Procedure: Left Heart Cath and Coronary Angiography;  Surgeon: Burnell Blanks, MD;  Location: [place] [facility];  Service: Cardiovascular;  Laterality: N/A;  . COLONOSCOPY    . CORONARY ARTERY BYPASS GRAFT N/A 02/02/2016   Procedure: CORONARY ARTERY BYPASS GRAFTING (CABG) times 4 using left internal mammary and left saphenous ven harvested by endoven ;  Surgeon: Grace Isaac, MD;  Location: [place];  Service: Open Heart Surgery;  Laterality: N/A;  . ENDARTERECTOMY Right 04/21/2015   Procedure: RIGHT CAROTID ENDARTERECTOMY;  Surgeon: Elam Dutch, MD;  Location: [facility] OR;  Service: Vascular;  Laterality: Right;  . ESOPHAGOGASTRODUODENOSCOPY (EGD) WITH PROPOFOL N/A 01/03/2016  Procedure: ESOPHAGOGASTRODUODENOSCOPY (EGD) WITH PROPOFOL;  Surgeon: Garlan Fair, MD;  Location: [facility];  Service: Endoscopy;  Laterality: N/A;  . KNEE ARTHROSCOPY Right   . PERIPHERAL VASCULAR CATHETERIZATION Bilateral 04/14/2015   Procedure: Carotid Angiography;  Surgeon: Serafina Mitchell, MD;  Location: [place] [facility];  Service: Cardiovascular;  Laterality: Bilateral;  . SKIN CANCER EXCISION Right    "temple"  . TEE WITHOUT CARDIOVERSION N/A 02/02/2016   Procedure: TRANSESOPHAGEAL ECHOCARDIOGRAM (TEE);  Surgeon: Grace Isaac, MD;  Location: [place];  Service: Open Heart Surgery;   Laterality: N/A;    Current Medications: Outpatient Medications Prior to Visit  Medication Sig Dispense Refill  . allopurinol (ZYLOPRIM) 100 MG tablet Take 100 mg by mouth daily.     Marland Kitchen amitriptyline (ELAVIL) 10 MG tablet Take 10 mg by mouth at bedtime.     Marland Kitchen aspirin EC 81 MG EC tablet Take 1 tablet (81 mg total) by mouth daily.    . colchicine 0.6 MG tablet     . Glucosamine Sulfate (GLUCOSAMINE RELIEF) 1000 MG TABS Take 2,000 mg by mouth daily.    . metoprolol tartrate (LOPRESSOR) 25 MG tablet TAKE 1 TABLET BY MOUTH TWICE A DAY 60 tablet 11  . rosuvastatin (CRESTOR) 5 MG tablet Take 1 tablet (5 mg total) by mouth daily. (Patient taking differently: Take 5 mg by mouth. ONLY TWO TIMES A WEEK) 30 tablet 3  . traMADol (ULTRAM) 50 MG tablet Take 1 tablet (50 mg total) by mouth every 4 (four) hours as needed for moderate pain. 30 tablet 0  . vitamin B-12 (CYANOCOBALAMIN) 500 MCG tablet Take 500 mcg by mouth daily.    Marland Kitchen esomeprazole (NEXIUM) 40 MG capsule Take 40 mg by mouth daily.      No facility-administered medications prior to visit.      Allergies:   Statins   Social History   Social History  . Marital status: Married    Spouse name: N/A  . Number of children: N/A  . Years of education: N/A   Social History Main Topics  . Smoking status: Former Smoker    Years: 1.50    Types: Cigarettes    Start date: 01/28/1956  . Smokeless tobacco: Never Used     Comment: "quit smoking cigarettes when I was 76 yr old"  . Alcohol use No  . Drug use: No  . Sexual activity: Yes   Other Topics Concern  . None   Social History Narrative   Epworth Sleepiness Scale = 16 (as of 01/28/16)     Family History:  [name] family history includes Arthritis in his mother; Breast cancer in his mother; CAD in his brother, brother, and mother; CVA in his brother, mother, and sister; Diabetes in his brother, brother, and mother; Heart attack in his father; Parkinson's disease in his mother.   ROS:     Please see the history of present illness.    ROS All other systems reviewed and are negative.   PHYSICAL EXAM:   VS:  BP 130/74   Pulse 73   Ht 5\' 8"  (1.727 m)   Wt 202 lb 6.4 oz (91.8 kg)   BMI 30.77 kg/m    GEN: Well nourished, well developed, in no acute distress  HEENT: normal  Neck: no JVD, carotid bruits, or masses Cardiac: RRR; no murmurs, rubs, or gallops,no edema  Respiratory:  clear to auscultation bilaterally, normal work of breathing GI: soft, nontender, nondistended, + BS MS: no deformity or  atrophy  Skin: warm and dry, no rash Neuro:  Alert and Oriented x 3, Strength and sensation are intact Psych: euthymic mood, full affect  Wt Readings from Last 3 Encounters:  01/01/17 202 lb 6.4 oz (91.8 kg)  08/22/16 200 lb 9.9 oz (91 kg)  06/22/16 200 lb 6.4 oz (90.9 kg)      Studies/Labs Reviewed:   EKG:  EKG is ordered today.  The ekg ordered today demonstrates Normal sinus rhythm without significant ST-T wave changes  Recent Labs: 01/31/2016: TSH 2.55 02/03/2016: Magnesium 2.2 02/08/2016: Hemoglobin 11.2; Platelets 275 05/16/2016: ALT 10; BUN 8; Creat 0.85; Potassium 4.7; Sodium 140   Lipid Panel    Component Value Date/Time   CHOL 146 09/27/2016 1056   TRIG 129 09/27/2016 1056   HDL 22 (L) 09/27/2016 1056   CHOLHDL 6.6 (H) 09/27/2016 1056   VLDL 26 09/27/2016 1056   LDLCALC 98 09/27/2016 1056    Additional studies/ records that were reviewed today include:   Cath 02/01/2016 Conclusion    Lat Ramus lesion, 40% stenosed.  Ramus lesion, 90% stenosed.  Ost LAD to Mid LAD lesion, 80% stenosed.  Ost 1st Diag to 1st Diag lesion, 90% stenosed.  Dist LAD lesion, 80% stenosed.  Ost LM to LM lesion, 50% stenosed.  Mid RCA to Dist RCA lesion, 100% stenosed.  Ost RCA to Mid RCA lesion, 80% stenosed.  Prox Cx lesion, 30% stenosed.  The left ventricular systolic function is normal.   1. Severe triple vessel CAD 2. Normal LV systolic function 3.  Unstable angina  Recommendations: Will admit to telemetry. Will ask CT surgery to see to discuss CABG. Continue ASA. He is statin intolerant. Will start beta blocker.       Echo 02/01/2016  LV EF: 60% -   65%  ------------------------------------------------------------------- Study Conclusions  - Left ventricle: The cavity size was normal. There was moderate   concentric hypertrophy. Systolic function was normal. The   estimated ejection fraction was in the range of 60% to 65%. Wall   motion was normal; there were no regional wall motion   abnormalities. Doppler parameters are consistent with abnormal   left ventricular relaxation (grade 1 diastolic dysfunction).   Doppler parameters are consistent with elevated ventricular   end-diastolic filling pressure. - Aortic valve: Possibly bicuspid; moderately thickened, moderately   calcified leaflets. There was mild regurgitation. Peak gradient   (S): 14 mm Hg. Mean gradient 7 mmHg. Valve area (VTI): 2.35 cm^2.   Valve area (Vmax): 2.2 cm^2. Valve area (Vmean): 2.16 cm^2. - Aortic root: The aortic root was normal in size. - Ascending aorta: The ascending aorta was not visualized. - Mitral valve: Structurally normal valve. There was mild   regurgitation. - Left atrium: The atrium was mildly dilated. - Right ventricle: Systolic function was normal. - Right atrium: The atrium was normal in size. - Tricuspid valve: There was trivial regurgitation. - Pulmonary arteries: Systolic pressure was within the normal   range. - Inferior vena cava: The vessel was normal in size. - Pericardium, extracardiac: There was no pericardial effusion.    Carotid US 02/01/2016 Summary:  - Carotid duplex was completed at [facility] in   March of 2017. results were as follows - Right at a previous   endarterectomy. There is no evidence of restenosis. Left - 1% to   39% ICA stenosis. Bilateral - Vertebral artery flow is antegrade. -  Palmar arch evaluation - Doppler waveforms remained normal   bilaterally with  both radial and ulnar compressions. - ABIs and Doppler waveforms indicate normal arterial flow   bilaterally at rest.    CABG by Dr. Servando Snare 02/01/2017 SURGICAL PROCEDURE:  Coronary artery bypass grafting x4 with left internal mammary to left anterior descending coronary artery, sequential reverse saphenous vein graft to the first and second intermediate reverse saphenous vein graft to the posterior descending with the left greater saphenous vein endoscopic harvesting left thigh.     ASSESSMENT:    1. Chest pain, unspecified type   2. Essential hypertension   3. Bilateral carotid artery disease ([place])   4. H/O: CVA (cerebrovascular accident)   5. Hyperlipidemia, unspecified hyperlipidemia type      PLAN:  In order of problems listed above:  1. Burning sensation in the chest: Unclear if it's acid reflux or angina. According to the patient, he had similar symptom years ago, however this type of symptom was also noticeable prior to his bypass surgery. His symptoms the symptom is not occurring very frequently at this time. We discussed options including stress test versus medical therapy. He is very hesitant to go through with a stress test at this time. Eventually we agreed on medical therapy for now, I will discontinue his Nexium and changed to Protonix 40 mg daily and add 15 mg daily of Imdur. If his blood pressure is able to tolerate, I will continue to up titrate the Imdur dose. I will see him back in 2 weeks, if his symptoms improve, we will hold off on additional workup.  2. Hypertension: Blood pressure stable 130/74  3. Bilateral carotid artery disease: Last carotid ultrasound on 02/01/2016 only showed mild disease  4. Hyperlipidemia: Last lipid panel obtained on 09/27/2016 showed cholesterol 146, HDL 22, LDL 98, triglycerides 129. His LDL goal should be less than 70, we did consider PCSK 9 inhibitor,  however was too costly for him. He is currently only on Crestor 5 mg daily, apparently he has significant myalgia associated with other statins.     Medication Adjustments/Labs and Tests Ordered: Current medicines are reviewed at length with the patient today.  Concerns regarding medicines are outlined above.  Medication changes, Labs and Tests ordered today are listed in the Patient Instructions below. Patient Instructions  Medication Instructions:  STOP Nexium START Protonix 40 mg (1 tablet) one time daily.   START isosorbide 15mg  (1/2 tablet) one time daily.  Follow-Up: Your physician recommends that you schedule a follow-up appointment in: 2 weeks with Almyra Deforest PA or Dr. Sallyanne Kuster.   Any Other Special Instructions Will Be Listed Below (If Applicable).     If you need a refill on your cardiac medications before your next appointment, please call your pharmacy.      Hilbert Corrigan, [place]  01/02/2017 10:46 AM    [address], [place], [place]  [address] Phone: [phone]; Fax: [phone]

## 2017-01-01 NOTE — Patient Instructions (Signed)
Medication Instructions:  STOP Nexium START Protonix 40 mg (1 tablet) one time daily.   START isosorbide 15mg  (1/2 tablet) one time daily.  Follow-Up: Your physician recommends that you schedule a follow-up appointment in: 2 weeks with Almyra Deforest PA or Dr. Sallyanne Kuster.   Any Other Special Instructions Will Be Listed Below (If Applicable).     If you need a refill on your cardiac medications before your next appointment, please call your pharmacy.

## 2017-01-01 NOTE — Telephone Encounter (Signed)
Returned the call back to the patient's daughter (per DPR). She stated that she wanted to know why he was prescribed the Imdur. She was informed, per today's visit, that it was for antianginal purposes. He was also switched from Nexium to Protonix to see if these two medications would relieve his symptoms. She stated that she understood and would call if she had any other questions or concerns.

## 2017-01-02 ENCOUNTER — Encounter: Payer: Self-pay | Admitting: Physician Assistant

## 2017-01-15 ENCOUNTER — Encounter: Payer: Self-pay | Admitting: Physician Assistant

## 2017-01-15 ENCOUNTER — Ambulatory Visit (INDEPENDENT_AMBULATORY_CARE_PROVIDER_SITE_OTHER): Payer: Medicare Other | Admitting: Physician Assistant

## 2017-01-15 VITALS — BP 112/70 | HR 66 | Ht 68.0 in | Wt 200.0 lb

## 2017-01-15 DIAGNOSIS — I10 Essential (primary) hypertension: Secondary | ICD-10-CM | POA: Diagnosis not present

## 2017-01-15 DIAGNOSIS — E785 Hyperlipidemia, unspecified: Secondary | ICD-10-CM | POA: Diagnosis not present

## 2017-01-15 DIAGNOSIS — I25810 Atherosclerosis of coronary artery bypass graft(s) without angina pectoris: Secondary | ICD-10-CM

## 2017-01-15 DIAGNOSIS — Z8673 Personal history of transient ischemic attack (TIA), and cerebral infarction without residual deficits: Secondary | ICD-10-CM

## 2017-01-15 DIAGNOSIS — I6529 Occlusion and stenosis of unspecified carotid artery: Secondary | ICD-10-CM

## 2017-01-15 MED ORDER — ISOSORBIDE MONONITRATE ER 30 MG PO TB24: 30.0000 mg | ORAL_TABLET | Freq: Every day | ORAL | 3 refills | Status: DC

## 2017-01-15 NOTE — Progress Notes (Signed)
Cardiology Office Note    Date:  01/15/2017   ID:  Trevor Ward, DOB 07-17-1941, MRN [id number]  PCP:  Mayra Neer, MD  Cardiologist:  Dr. Sallyanne Kuster  Chief Complaint  Patient presents with  . Follow-up    pt has no complaints today. Seen for Dr. Sallyanne Kuster    History of Present Illness:  Trevor Ward is a 76 y.o. male  with PMH of HTN, carotid artery stenosis, h/o CVA, gout and HLD. He underwent 4v CABG (LIMA to LAD, sequential SVG to first and second ramus intermediate, SVG to PDA) in May 2017 for multivessel CAD following abnormal Myoview. He had a slow recovery postoperatively, but had no real complications. He did have presyncope on hospital follow up in June 2017, his Cozaar was discontinued due to hypotension.   I saw the patient 01/01/2017 for evaluation of burning sensation in the chest radiating to the jaw. According to the patient, he had similar symptoms years ago, however also mentions he has similar symptoms prior to previous bypass surgery. EKG shows no sign of ischemia. There was no exertional component to his recent symptoms. I did offer medical therapy versus stress test. He was very hesitant to go through with a stress test at this time. Therefore I switched his Nexium to Protonix and added Imdur 15 mg daily for antianginal purposes. We also considered PCSK 9 inhibitor as he is only on 5 mg daily of Crestor, however the cost was prohibitive.  He presents today for reassessment. He has not had any further burning sensation since the last time we met. Otherwise he denies any shortness of breath. He does have bilateral basilar crackles, this is consistent with previous CT scan showing bilateral lower lobe bronchiectasis. Otherwise he does not seems to be volume overloaded on physical exam. He has no edema in the lower extremity. We will further up titrated antianginal medication by increasing Imdur to 30 mg daily. Otherwise he denies any headache associated with  Imdur.   Past Medical History:  Diagnosis Date  . Arthritis    "fingers" (02/01/2016)  . Depression   . GERD (gastroesophageal reflux disease)   . High cholesterol    "can't take RX; made my legs weak" (02/01/2016)  . History of gout   . History of hiatal hernia   . Hypertension   . Peripheral vascular disease ([place])    carotid artery stenosis  . Skin cancer of face     "the good kind"  . Stroke [facility]) 2016   denies residual on 02/01/2016    Past Surgical History:  Procedure Laterality Date  . CARDIAC CATHETERIZATION N/A 02/01/2016   Procedure: Left Heart Cath and Coronary Angiography;  Surgeon: Burnell Blanks, MD;  Location: [place] CV LAB;  Service: Cardiovascular;  Laterality: N/A;  . COLONOSCOPY    . CORONARY ARTERY BYPASS GRAFT N/A 02/02/2016   Procedure: CORONARY ARTERY BYPASS GRAFTING (CABG) times 4 using left internal mammary and left saphenous ven harvested by endoven ;  Surgeon: [place] Isaac, MD;  Location: [place];  Service: Open Heart Surgery;  Laterality: N/A;  . ENDARTERECTOMY Right 04/21/2015   Procedure: RIGHT CAROTID ENDARTERECTOMY;  Surgeon: Elam Dutch, MD;  Location: [place];  Service: Vascular;  Laterality: Right;  . ESOPHAGOGASTRODUODENOSCOPY (EGD) WITH PROPOFOL N/A 01/03/2016   Procedure: ESOPHAGOGASTRODUODENOSCOPY (EGD) WITH PROPOFOL;  Surgeon: Garlan Fair, MD;  Location: [facility];  Service: Endoscopy;  Laterality: N/A;  . KNEE ARTHROSCOPY Right   . PERIPHERAL VASCULAR  CATHETERIZATION Bilateral 04/14/2015   Procedure: Carotid Angiography;  Surgeon: Nada Libman, MD;  Location: [facility] INVASIVE CV LAB;  Service: Cardiovascular;  Laterality: Bilateral;  . SKIN CANCER EXCISION Right    "temple"  . TEE WITHOUT CARDIOVERSION N/A 02/02/2016   Procedure: TRANSESOPHAGEAL ECHOCARDIOGRAM (TEE);  Surgeon: Delight Ovens, MD;  Location: [facility] OR;  Service: Open Heart Surgery;  Laterality: N/A;    Current Medications: Outpatient Medications Prior to  Visit  Medication Sig Dispense Refill  . allopurinol (ZYLOPRIM) 100 MG tablet Take 100 mg by mouth daily.     Marland Kitchen aspirin EC 81 MG EC tablet Take 1 tablet (81 mg total) by mouth daily.    . colchicine 0.6 MG tablet     . Glucosamine Sulfate (GLUCOSAMINE RELIEF) 1000 MG TABS Take 2,000 mg by mouth daily.    . metoprolol tartrate (LOPRESSOR) 25 MG tablet TAKE 1 TABLET BY MOUTH TWICE A DAY 60 tablet 11  . pantoprazole (PROTONIX) 40 MG tablet Take 1 tablet (40 mg total) by mouth daily. 90 tablet 3  . rosuvastatin (CRESTOR) 5 MG tablet Take 1 tablet (5 mg total) by mouth daily. (Patient taking differently: Take 5 mg by mouth. ONLY TWO TIMES A WEEK) 30 tablet 3  . vitamin B-12 (CYANOCOBALAMIN) 500 MCG tablet Take 500 mcg by mouth daily.    . isosorbide mononitrate (IMDUR) 30 MG 24 hr tablet Take 0.5 tablets (15 mg total) by mouth daily. 45 tablet 3  . amitriptyline (ELAVIL) 10 MG tablet Take 10 mg by mouth at bedtime.     . traMADol (ULTRAM) 50 MG tablet Take 1 tablet (50 mg total) by mouth every 4 (four) hours as needed for moderate pain. (Patient not taking: Reported on 01/15/2017) 30 tablet 0   No facility-administered medications prior to visit.      Allergies:   Statins   Social History   Social History  . Marital status: Married    Spouse name: N/A  . Number of children: N/A  . Years of education: N/A   Social History Main Topics  . Smoking status: Former Smoker    Years: 1.50    Types: Cigarettes    Start date: 01/28/1956  . Smokeless tobacco: Never Used     Comment: "quit smoking cigarettes when I was 76 yr old"  . Alcohol use No  . Drug use: No  . Sexual activity: Yes   Other Topics Concern  . None   Social History Narrative   Epworth Sleepiness Scale = 16 (as of 01/28/16)     Family History:  [name] family history includes Arthritis in his mother; Breast cancer in his mother; CAD in his brother, brother, and mother; CVA in his brother, mother, and sister; Diabetes in  his brother, brother, and mother; Heart attack in his father; Parkinson's disease in his mother.   ROS:   Please see the history of present illness.    ROS All other systems reviewed and are negative.   PHYSICAL EXAM:   VS:  BP 112/70   Pulse 66   Ht 5\' 8"  (1.727 m)   Wt 200 lb (90.7 kg)   BMI 30.41 kg/m    GEN: Well nourished, well developed, in no acute distress  HEENT: normal  Neck: no JVD, carotid bruits, or masses Cardiac: RRR; no murmurs, rubs, or gallops,no edema  Respiratory:  clear to auscultation bilaterally, normal work of breathing +bibasilar crackle GI: soft, nontender, nondistended, + BS MS: no deformity or atrophy  Skin: warm and dry, no rash Neuro:  Alert and Oriented x 3, Strength and sensation are intact Psych: euthymic mood, full affect  Wt Readings from Last 3 Encounters:  01/15/17 200 lb (90.7 kg)  01/01/17 202 lb 6.4 oz (91.8 kg)  08/22/16 200 lb 9.9 oz (91 kg)      Studies/Labs Reviewed:   EKG:  EKG is not ordered today.   Recent Labs: 01/31/2016: TSH 2.55 02/03/2016: Magnesium 2.2 02/08/2016: Hemoglobin 11.2; Platelets 275 05/16/2016: ALT 10; BUN 8; Creat 0.85; Potassium 4.7; Sodium 140   Lipid Panel    Component Value Date/Time   CHOL 146 09/27/2016 1056   TRIG 129 09/27/2016 1056   HDL 22 (L) 09/27/2016 1056   CHOLHDL 6.6 (H) 09/27/2016 1056   VLDL 26 09/27/2016 1056   LDLCALC 98 09/27/2016 1056    Additional studies/ records that were reviewed today include:   Cath 02/01/2016 Conclusion    Lat Ramus lesion, 40% stenosed.  Ramus lesion, 90% stenosed.  Ost LAD to Mid LAD lesion, 80% stenosed.  Ost 1st Diag to 1st Diag lesion, 90% stenosed.  Dist LAD lesion, 80% stenosed.  Ost LM to LM lesion, 50% stenosed.  Mid RCA to Dist RCA lesion, 100% stenosed.  Ost RCA to Mid RCA lesion, 80% stenosed.  Prox Cx lesion, 30% stenosed.  The left ventricular systolic function is normal.  1. Severe triple vessel CAD 2. Normal LV  systolic function 3. Unstable angina  Recommendations: Will admit to telemetry. Will ask CT surgery to see to discuss CABG. Continue ASA. He is statin intolerant. Will start beta blocker.       Echo 02/01/2016  LV EF: 60% - 65%  ------------------------------------------------------------------- Study Conclusions  - Left ventricle: The cavity size was normal. There was moderate concentric hypertrophy. Systolic function was normal. The estimated ejection fraction was in the range of 60% to 65%. Wall motion was normal; there were no regional wall motion abnormalities. Doppler parameters are consistent with abnormal left ventricular relaxation (grade 1 diastolic dysfunction). Doppler parameters are consistent with elevated ventricular end-diastolic filling pressure. - Aortic valve: Possibly bicuspid; moderately thickened, moderately calcified leaflets. There was mild regurgitation. Peak gradient (S): 14 mm Hg. Mean gradient 7 mmHg. Valve area (VTI): 2.35 cm^2. Valve area (Vmax): 2.2 cm^2. Valve area (Vmean): 2.16 cm^2. - Aortic root: The aortic root was normal in size. - Ascending aorta: The ascending aorta was not visualized. - Mitral valve: Structurally normal valve. There was mild regurgitation. - Left atrium: The atrium was mildly dilated. - Right ventricle: Systolic function was normal. - Right atrium: The atrium was normal in size. - Tricuspid valve: There was trivial regurgitation. - Pulmonary arteries: Systolic pressure was within the normal range. - Inferior vena cava: The vessel was normal in size. - Pericardium, extracardiac: There was no pericardial effusion.    Carotid US 02/01/2016 Summary:  - Carotid duplex was completed at [facility] in March of 2017. results were as follows - Right at a previous endarterectomy. There is no evidence of restenosis. Left - 1% to 39% ICA stenosis. Bilateral - Vertebral  artery flow is antegrade. - Palmar arch evaluation - Doppler waveforms remained normal bilaterally with both radial and ulnar compressions. - ABIs and Doppler waveforms indicate normal arterial flow bilaterally at rest.    CABG by Dr. Servando Snare 02/01/2017 SURGICAL PROCEDURE: Coronary artery bypass grafting x4 with left internal mammary to left anterior descending coronary artery, sequential reverse saphenous vein graft to the first and  second intermediate reverse saphenous vein graft to the posterior descending with the left greater saphenous vein endoscopic harvesting left thigh.    ASSESSMENT:    1. Coronary artery disease involving coronary bypass graft of native heart without angina pectoris   2. Essential hypertension   3. Stenosis of carotid artery, unspecified laterality   4. Hyperlipidemia, unspecified hyperlipidemia type   5. H/O: CVA (cerebrovascular accident)      PLAN:  In order of problems listed above:  1. CAD s/p CABG x 4 LIMA to LAD, sequential SVG to first and second ramus intermediate, SVG to PDAin May 2017  - Recently evaluated for burning sensation in the chest which had no correlation with exertion. I offered either medical therapy or stress test, he was unwilling to proceed with stress test, therefore I switched him to Protonix and added Imdur. Since then his chest discomfort has completely resolved. Will continue on current medical therapy, continue up titration of antianginal medication, increase Imdur to 30 mg daily.  2. Hypertension: Blood pressure stable  3. Bilateral carotid artery disease: Last carotid ultrasound in May 2017 only showed mild disease  4. Hyperlipidemia: Significant myalgia to statins. He is currently on Crestor 5 mg daily. Last lipid panel in January 2018 showed LDL of 98, goal should be less than 70. He is unlikely to be able to afford PCSK 9 A inhibitor.    Medication Adjustments/Labs and Tests Ordered: Current medicines  are reviewed at length with the patient today.  Concerns regarding medicines are outlined above.  Medication changes, Labs and Tests ordered today are listed in the Patient Instructions below. Patient Instructions  Medication Instructions:   INCREASE Imdur to '30mg'$  daily  Labwork: none   Testing/Procedures: none   Follow-Up: as scheduled with Dr. Sallyanne Kuster    If you need a refill on your cardiac medications before your next appointment, please call your pharmacy.      Hilbert Corrigan, [place]  01/15/2017 6:54 PM    [facility] [address], [place], [place]  [address] Phone: [phone]; Fax: [phone]

## 2017-01-15 NOTE — Patient Instructions (Signed)
Medication Instructions:   INCREASE Imdur to 30mg  daily  Labwork: none   Testing/Procedures: none   Follow-Up: as scheduled with Dr. Sallyanne Kuster    If you need a refill on your cardiac medications before your next appointment, please call your pharmacy.

## 2017-02-12 ENCOUNTER — Encounter: Payer: Self-pay | Admitting: Cardiovascular Disease

## 2017-02-12 ENCOUNTER — Ambulatory Visit (INDEPENDENT_AMBULATORY_CARE_PROVIDER_SITE_OTHER): Payer: Medicare Other | Admitting: Cardiovascular Disease

## 2017-02-12 VITALS — BP 104/68 | HR 74 | Ht 68.0 in | Wt 201.0 lb

## 2017-02-12 DIAGNOSIS — I700 Atherosclerosis of aorta: Secondary | ICD-10-CM

## 2017-02-12 DIAGNOSIS — Z9889 Other specified postprocedural states: Secondary | ICD-10-CM

## 2017-02-12 DIAGNOSIS — I25118 Atherosclerotic heart disease of native coronary artery with other forms of angina pectoris: Secondary | ICD-10-CM | POA: Diagnosis not present

## 2017-02-12 DIAGNOSIS — Z8673 Personal history of transient ischemic attack (TIA), and cerebral infarction without residual deficits: Secondary | ICD-10-CM

## 2017-02-12 DIAGNOSIS — I10 Essential (primary) hypertension: Secondary | ICD-10-CM | POA: Diagnosis not present

## 2017-02-12 DIAGNOSIS — I209 Angina pectoris, unspecified: Secondary | ICD-10-CM

## 2017-02-12 DIAGNOSIS — I6529 Occlusion and stenosis of unspecified carotid artery: Secondary | ICD-10-CM

## 2017-02-12 DIAGNOSIS — E785 Hyperlipidemia, unspecified: Secondary | ICD-10-CM

## 2017-02-12 NOTE — Patient Instructions (Signed)
Medication Instructions: Dr Sallyanne Kuster recommends that you continue on your current medications as directed. Please refer to the Current Medication list given to you today.  Labwork: Your physician recommends that you return for lab work in 77 month - FASTING.  Testing/Procedures: NONE ORDERED  Follow-up: Dr Sallyanne Kuster recommends that you schedule a follow-up appointment in 12 months. You will receive a reminder letter in the mail two months in advance. If you don't receive a letter, please call our office to schedule the follow-up appointment.  If you need a refill on your cardiac medications before your next appointment, please call your pharmacy.

## 2017-02-12 NOTE — Progress Notes (Signed)
Cardiology Office Note    Date:  02/12/2017   ID:  Trevor Ward, DOB August 23, 1941, MRN [id number]  PCP:  Mayra Neer, MD  Cardiologist:   Sanda Klein, MD   Chief Complaint  Patient presents with  . Follow-up    pt c/o DOE    History of Present Illness:  Trevor Ward is a 76 y.o. male who is now almost 3 months status post coronary bypass surgery for multivessel CAD (02/02/16: CABG x4 with LIMA to LAD, SVG sequential to the first and second intermediate SVG to PDA, Ceasar Mons, MD).  This spring he began to experience some heartburn. This was not clearly associated with physical activity but was reminiscent of his pre-bypass complaints. His ECG was normal. He declined a stress test. His proton pump inhibitor prescription was modified and low-dose isosorbide mononitrate was added. He has been essentially asymptomatic since that time. He does not like to exercise much. He cannot keep up with his wife when they go walking. He has been taking rosuvastatin 5 mg daily for about a month now, without any muscular skeletal complaints.  The patient specifically denies any chest pain with exertion, dyspnea at rest or with exertion, orthopnea, paroxysmal nocturnal dyspnea, syncope, palpitations, focal neurological deficits, intermittent claudication, lower extremity edema, unexplained weight gain, cough, hemoptysis or wheezing.   Past Medical History:  Diagnosis Date  . Arthritis    "fingers" (02/01/2016)  . Depression   . GERD (gastroesophageal reflux disease)   . High cholesterol    "can't take RX; made my legs weak" (02/01/2016)  . History of gout   . History of hiatal hernia   . Hypertension   . Peripheral vascular disease ([place])    carotid artery stenosis  . Skin cancer of face     "the good kind"  . Stroke [facility]) 2016   denies residual on 02/01/2016    Past Surgical History:  Procedure Laterality Date  . CARDIAC CATHETERIZATION N/A 02/01/2016   Procedure: Left Heart Cath  and Coronary Angiography;  Surgeon: Burnell Blanks, MD;  Location: [place] CV LAB;  Service: Cardiovascular;  Laterality: N/A;  . COLONOSCOPY    . CORONARY ARTERY BYPASS GRAFT N/A 02/02/2016   Procedure: CORONARY ARTERY BYPASS GRAFTING (CABG) times 4 using left internal mammary and left saphenous ven harvested by endoven ;  Surgeon: [place] Isaac, MD;  Location: [place];  Service: Open Heart Surgery;  Laterality: N/A;  . ENDARTERECTOMY Right 04/21/2015   Procedure: RIGHT CAROTID ENDARTERECTOMY;  Surgeon: Elam Dutch, MD;  Location: [place];  Service: Vascular;  Laterality: Right;  . ESOPHAGOGASTRODUODENOSCOPY (EGD) WITH PROPOFOL N/A 01/03/2016   Procedure: ESOPHAGOGASTRODUODENOSCOPY (EGD) WITH PROPOFOL;  Surgeon: Garlan Fair, MD;  [facility];  Service: Endoscopy;  Laterality: N/A;  . KNEE ARTHROSCOPY Right   . PERIPHERAL VASCULAR CATHETERIZATION Bilateral 04/14/2015   Procedure: Carotid Angiography;  Surgeon: Serafina Mitchell, MD;  Location: [place] [facility];  Service: Cardiovascular;  Laterality: Bilateral;  . SKIN CANCER EXCISION Right    "temple"  . TEE WITHOUT CARDIOVERSION N/A 02/02/2016   Procedure: TRANSESOPHAGEAL ECHOCARDIOGRAM (TEE);  Surgeon: [place] Isaac, MD;  Location: [place];  Service: Open Heart Surgery;  Laterality: N/A;    Current Medications: Outpatient Medications Prior to Visit  Medication Sig Dispense Refill  . allopurinol (ZYLOPRIM) 100 MG tablet Take 100 mg by mouth daily.     Marland Kitchen aspirin EC 81 MG EC tablet Take 1 tablet (81 mg  total) by mouth daily.    . colchicine 0.6 MG tablet     . Glucosamine Sulfate (GLUCOSAMINE RELIEF) 1000 MG TABS Take 2,000 mg by mouth daily.    . isosorbide mononitrate (IMDUR) 30 MG 24 hr tablet Take 1 tablet (30 mg total) by mouth daily. 90 tablet 3  . metoprolol tartrate (LOPRESSOR) 25 MG tablet TAKE 1 TABLET BY MOUTH TWICE A DAY 60 tablet 11  . pantoprazole (PROTONIX) 40 MG tablet Take 1 tablet (40 mg total)  by mouth daily. 90 tablet 3  . rosuvastatin (CRESTOR) 5 MG tablet Take 1 tablet (5 mg total) by mouth daily. (Patient taking differently: Take 5 mg by mouth. ONLY TWO TIMES A WEEK) 30 tablet 3  . vitamin B-12 (CYANOCOBALAMIN) 500 MCG tablet Take 500 mcg by mouth daily.     No facility-administered medications prior to visit.      Allergies:   Statins   Social History   Social History  . Marital status: Married    Spouse name: N/A  . Number of children: N/A  . Years of education: N/A   Social History Main Topics  . Smoking status: Former Smoker    Years: 1.50    Types: Cigarettes    Start date: 01/28/1956  . Smokeless tobacco: Never Used     Comment: "quit smoking cigarettes when I was 76 yr old"  . Alcohol use No  . Drug use: No  . Sexual activity: Yes   Other Topics Concern  . None   Social History Narrative   Epworth Sleepiness Scale = 16 (as of 01/28/16)     Family History:  [name] family history includes Arthritis in his mother; Breast cancer in his mother; CAD in his brother, brother, and mother; CVA in his brother, mother, and sister; Diabetes in his brother, brother, and mother; Heart attack in his father; Parkinson's disease in his mother.   ROS:   Please see the history of present illness.    ROS All other systems reviewed and are negative.   PHYSICAL EXAM:   VS:  BP 104/68   Pulse 74   Ht 5\' 8"  (1.727 m)   Wt 201 lb (91.2 kg)   BMI 30.56 kg/m     General: Alert, oriented x3, no distress Head: no evidence of trauma, PERRL, EOMI, no exophtalmos or lid lag, no myxedema, no xanthelasma; normal ears, nose and oropharynx Neck: normal jugular venous pulsations and no hepatojugular reflux; brisk carotid pulses without delay and no carotid bruits Chest: clear to auscultation, no signs of consolidation by percussion or palpation, normal fremitus, symmetrical and full respiratory excursions Cardiovascular: normal position and quality of the apical impulse,  regular rhythm, normal first and second heart sounds, no murmurs, rubs or gallops Abdomen: no tenderness or distention, no masses by palpation, no abnormal pulsatility or arterial bruits, normal bowel sounds, no hepatosplenomegaly Extremities: no clubbing, cyanosis or edema; 2+ radial, ulnar and brachial pulses bilaterally; 2+ right femoral, posterior tibial and dorsalis pedis pulses; 2+ left femoral, posterior tibial and dorsalis pedis pulses; no subclavian or femoral bruits Neurological: grossly nonfocal   Wt Readings from Last 3 Encounters:  02/12/17 201 lb (91.2 kg)  01/15/17 200 lb (90.7 kg)  01/01/17 202 lb 6.4 oz (91.8 kg)      Studies/Labs Reviewed:   EKG:  EKG is not ordered today. ECG 01/01/2017 shows normal sinus rhythm, normal tracing without any repolarization abnormalities.  Recent Labs: 05/16/2016: ALT 10; BUN 8; Creat 0.85; Potassium  4.7; Sodium 140   Lipid Panel    Component Value Date/Time   CHOL 146 09/27/2016 1056   TRIG 129 09/27/2016 1056   HDL 22 (L) 09/27/2016 1056   CHOLHDL 6.6 (H) 09/27/2016 1056   VLDL 26 09/27/2016 1056   [place] 98 09/27/2016 1056    Additional studies/ records that were reviewed today include:  Notes from his visit with Dr. Servando Snare   ASSESSMENT:    1. Coronary artery disease of native artery of native heart with stable angina pectoris ([place])   2. Aortic atherosclerosis ([place])   3. History of ischemic right MCA stroke   4. History of Rt CA endarterectomy   5. Dyslipidemia   6. Essential hypertension      PLAN:  In order of problems listed above:  1. CAD s/p CABG: Not clear to me whether his complaints truly represented angina or acid reflux. They seem to have improved with long-acting nitrates and I suspect that he has stable angina. He seems to think it was his hiatal hernia. Encouraged him to remain physically active and reports exercise related chest symptoms. 2. Ao atherosclerosis: Seen on imaging studies. 3. Hx CVA:  History of right middle cerebral artery stroke without residual neurological deficit. 4. S/P R CEA: Followed by Dr. Oneida Alar 5. HLP: He's been on the daily dose of statin now for about a month without side effects. Will check lipid profile in about another month. Target LDL less than 70. 6. HTN: Excellent blood pressure control    Medication Adjustments/Labs and Tests Ordered: Current medicines are reviewed at length with the patient today.  Concerns regarding medicines are outlined above.  Medication changes, Labs and Tests ordered today are listed in the Patient Instructions below. Patient Instructions  Medication Instructions: Dr Sallyanne Kuster recommends that you continue on your current medications as directed. Please refer to the Current Medication list given to you today.  Labwork: Your physician recommends that you return for lab work in 65 month - FASTING.  Testing/Procedures: NONE ORDERED  Follow-up: Dr Sallyanne Kuster recommends that you schedule a follow-up appointment in 12 months. You will receive a reminder letter in the mail two months in advance. If you don't receive a letter, please call our office to schedule the follow-up appointment.  If you need a refill on your cardiac medications before your next appointment, please call your pharmacy.    Signed, Sanda Klein, MD  02/12/2017 8:54 AM    [facility] [place], [place], [place]  [address] Phone: [phone]; Fax: [phone]

## 2017-03-16 DIAGNOSIS — Z85828 Personal history of other malignant neoplasm of skin: Secondary | ICD-10-CM | POA: Diagnosis not present

## 2017-03-16 DIAGNOSIS — E785 Hyperlipidemia, unspecified: Secondary | ICD-10-CM | POA: Diagnosis not present

## 2017-03-16 DIAGNOSIS — L905 Scar conditions and fibrosis of skin: Secondary | ICD-10-CM | POA: Diagnosis not present

## 2017-03-16 DIAGNOSIS — L570 Actinic keratosis: Secondary | ICD-10-CM | POA: Diagnosis not present

## 2017-03-16 LAB — LIPID PANEL
CHOL/HDL RATIO: 5 ratio (ref 0.0–5.0)
CHOLESTEROL TOTAL: 115 mg/dL (ref 100–199)
HDL: 23 mg/dL — AB (ref >39)
LDL Calculated: 58 mg/dL (ref 0–99)
TRIGLYCERIDES: 168 mg/dL — AB (ref 0–149)
VLDL Cholesterol Cal: 34 mg/dL (ref 5–40)

## 2017-03-20 ENCOUNTER — Encounter: Payer: Self-pay | Admitting: Cardiovascular Disease

## 2017-05-15 ENCOUNTER — Other Ambulatory Visit: Payer: Self-pay | Admitting: Family Medicine

## 2017-05-15 DIAGNOSIS — F411 Generalized anxiety disorder: Secondary | ICD-10-CM | POA: Diagnosis not present

## 2017-05-15 DIAGNOSIS — I119 Hypertensive heart disease without heart failure: Secondary | ICD-10-CM | POA: Diagnosis not present

## 2017-05-15 DIAGNOSIS — I712 Thoracic aortic aneurysm, without rupture, unspecified: Secondary | ICD-10-CM

## 2017-05-15 DIAGNOSIS — Z8673 Personal history of transient ischemic attack (TIA), and cerebral infarction without residual deficits: Secondary | ICD-10-CM | POA: Diagnosis not present

## 2017-05-15 DIAGNOSIS — I779 Disorder of arteries and arterioles, unspecified: Secondary | ICD-10-CM | POA: Diagnosis not present

## 2017-05-15 DIAGNOSIS — K449 Diaphragmatic hernia without obstruction or gangrene: Secondary | ICD-10-CM | POA: Diagnosis not present

## 2017-05-15 DIAGNOSIS — R7301 Impaired fasting glucose: Secondary | ICD-10-CM | POA: Diagnosis not present

## 2017-05-15 DIAGNOSIS — M109 Gout, unspecified: Secondary | ICD-10-CM | POA: Diagnosis not present

## 2017-05-15 DIAGNOSIS — Z0000 Encounter for general adult medical examination without abnormal findings: Secondary | ICD-10-CM | POA: Diagnosis not present

## 2017-05-15 DIAGNOSIS — I700 Atherosclerosis of aorta: Secondary | ICD-10-CM | POA: Diagnosis not present

## 2017-05-15 DIAGNOSIS — N529 Male erectile dysfunction, unspecified: Secondary | ICD-10-CM | POA: Diagnosis not present

## 2017-05-15 DIAGNOSIS — E782 Mixed hyperlipidemia: Secondary | ICD-10-CM | POA: Diagnosis not present

## 2017-05-18 ENCOUNTER — Other Ambulatory Visit: Payer: Self-pay | Admitting: Cardiothoracic Surgery

## 2017-06-04 ENCOUNTER — Ambulatory Visit
Admission: RE | Admit: 2017-06-04 | Discharge: 2017-06-04 | Disposition: A | Discharge status: Home / Self Care | Payer: Medicare Other | Source: Ambulatory Visit | Attending: Family Medicine | Admitting: Family Medicine

## 2017-06-04 DIAGNOSIS — I712 Thoracic aortic aneurysm, without rupture, unspecified: Secondary | ICD-10-CM

## 2017-06-04 MED ORDER — IOPAMIDOL (ISOVUE-370) INJECTION 76%
75.0000 mL | Freq: Once | INTRAVENOUS | Status: AC | PRN
  Administered 2017-06-04: 75 mL via INTRAVENOUS

## 2017-06-21 ENCOUNTER — Ambulatory Visit (HOSPITAL_COMMUNITY)
Admission: RE | Admit: 2017-06-21 | Discharge: 2017-06-21 | Disposition: A | Discharge status: Home / Self Care | Payer: Medicare Other | Source: Ambulatory Visit | Attending: Vascular Surgery | Admitting: Vascular Surgery

## 2017-06-21 ENCOUNTER — Encounter: Payer: Self-pay | Admitting: Family

## 2017-06-21 ENCOUNTER — Ambulatory Visit (INDEPENDENT_AMBULATORY_CARE_PROVIDER_SITE_OTHER): Payer: Medicare Other | Admitting: Cardiothoracic Surgery

## 2017-06-21 ENCOUNTER — Ambulatory Visit (INDEPENDENT_AMBULATORY_CARE_PROVIDER_SITE_OTHER): Payer: Medicare Other | Admitting: Family

## 2017-06-21 ENCOUNTER — Encounter: Payer: Self-pay | Admitting: Cardiothoracic Surgery

## 2017-06-21 VITALS — BP 114/74 | HR 67 | Temp 97.0°F | Resp 18 | Ht 68.0 in | Wt 199.0 lb

## 2017-06-21 VITALS — BP 124/76 | HR 65 | Resp 16 | Ht 68.0 in | Wt 199.0 lb

## 2017-06-21 DIAGNOSIS — Z951 Presence of aortocoronary bypass graft: Secondary | ICD-10-CM

## 2017-06-21 DIAGNOSIS — I200 Unstable angina: Secondary | ICD-10-CM

## 2017-06-21 DIAGNOSIS — I712 Thoracic aortic aneurysm, without rupture, unspecified: Secondary | ICD-10-CM

## 2017-06-21 DIAGNOSIS — I6529 Occlusion and stenosis of unspecified carotid artery: Secondary | ICD-10-CM

## 2017-06-21 DIAGNOSIS — I6521 Occlusion and stenosis of right carotid artery: Secondary | ICD-10-CM

## 2017-06-21 DIAGNOSIS — Z8673 Personal history of transient ischemic attack (TIA), and cerebral infarction without residual deficits: Secondary | ICD-10-CM

## 2017-06-21 DIAGNOSIS — Z9889 Other specified postprocedural states: Secondary | ICD-10-CM | POA: Insufficient documentation

## 2017-06-21 DIAGNOSIS — I25110 Atherosclerotic heart disease of native coronary artery with unstable angina pectoris: Secondary | ICD-10-CM

## 2017-06-21 LAB — VAS US CAROTID
LEFT ECA DIAS: -11 cm/s
LEFT VERTEBRAL DIAS: 0 cm/s
LICADDIAS: -15 cm/s
LICADSYS: -58 cm/s
LICAPDIAS: -14 cm/s
LICAPSYS: -78 cm/s
Left CCA dist dias: 11 cm/s
Left CCA dist sys: 75 cm/s
Left CCA prox dias: -16 cm/s
Left CCA prox sys: -119 cm/s
RCCADSYS: -54 cm/s
RIGHT CCA MID DIAS: 14 cm/s
RIGHT ECA DIAS: -7 cm/s
RIGHT VERTEBRAL DIAS: 11 cm/s
Right CCA prox dias: 9 cm/s
Right CCA prox sys: 90 cm/s

## 2017-06-21 NOTE — Progress Notes (Signed)
Chief Complaint: Follow up Extracranial Carotid Artery Stenosis   History of Present Illness  Trevor Ward is a 76 y.o. male who presents for routine surveillance s/p right CEA (Date: 04/21/15) by Dr. Oneida Alar.  He has not had any post operative TIA or stroke symptoms.  He had a pre-opererative stoke on 04/11/15 as manifested by left facial drooping and staggering. He was evaluated that evening at [facility] per wife. Wife states that MRI of his brain shows he had a small stroke. He denies any subsequent strokes.  He no longer staggers and no longer has the daily headache that he had before the CEA.  He denies any history of amaurosis fugax, hemiparesis, or speech difficulties.   He had a 5 vessel CABG in May 2017. He states he feels better since his CABG. He has a cough when he lays down at night.  He denies any worse cough or dyspnea than usual.   He had a non melanoma skin cancer removed from his right temple.   Pt Diabetic: last A1C result on file was 6.4 on 02-02-16 Pt smoker: non smoker  Pt meds include: Statin : yes ASA: yes Other anticoagulants/antiplatelets: no   Past Medical History:  Diagnosis Date  . Arthritis    "fingers" (02/01/2016)  . Depression   . Diabetes mellitus without complication ([place])   . GERD (gastroesophageal reflux disease)   . High cholesterol    "can't take RX; made my legs weak" (02/01/2016)  . History of gout   . History of hiatal hernia   . Hypertension   . Peripheral vascular disease ([place])    carotid artery stenosis  . Skin cancer of face     "the good kind"  . Stroke [facility]) 2016   denies residual on 02/01/2016    Social History Social History  Substance Use Topics  . Smoking status: Former Smoker    Years: 1.50    Types: Cigarettes    Start date: 01/28/1956  . Smokeless tobacco: Never Used     Comment: "quit smoking cigarettes when I was 76 yr old"  . Alcohol use No    Family History Family History  Problem Relation Age of  Onset  . CAD Mother   . CVA Mother   . Diabetes Mother   . Breast cancer Mother   . Parkinson's disease Mother   . Arthritis Mother   . Heart attack Father   . CVA Sister   . CAD Brother   . Diabetes Brother   . CVA Brother   . CAD Brother   . Diabetes Brother     Surgical History Past Surgical History:  Procedure Laterality Date  . CARDIAC CATHETERIZATION N/A 02/01/2016   Procedure: Left Heart Cath and Coronary Angiography;  Surgeon: Burnell Blanks, MD;  Location: [place] [facility];  Service: Cardiovascular;  Laterality: N/A;  . COLONOSCOPY    . CORONARY ARTERY BYPASS GRAFT N/A 02/02/2016   Procedure: CORONARY ARTERY BYPASS GRAFTING (CABG) times 4 using left internal mammary and left saphenous ven harvested by endoven ;  Surgeon: Grace Isaac, MD;  Location: [place];  Service: Open Heart Surgery;  Laterality: N/A;  . ENDARTERECTOMY Right 04/21/2015   Procedure: RIGHT CAROTID ENDARTERECTOMY;  Surgeon: Elam Dutch, MD;  Location: [place];  Service: Vascular;  Laterality: Right;  . ESOPHAGOGASTRODUODENOSCOPY (EGD) WITH PROPOFOL N/A 01/03/2016   Procedure: ESOPHAGOGASTRODUODENOSCOPY (EGD) WITH PROPOFOL;  Surgeon: Garlan Fair, MD;  Location: [facility];  Service: Endoscopy;  Laterality: N/A;  . KNEE ARTHROSCOPY Right   . open heart surgery  01/2016  . PERIPHERAL VASCULAR CATHETERIZATION Bilateral 04/14/2015   Procedure: Carotid Angiography;  Surgeon: Serafina Mitchell, MD;  Location: [place] [facility];  Service: Cardiovascular;  Laterality: Bilateral;  . SKIN CANCER EXCISION Right    "temple"  . TEE WITHOUT CARDIOVERSION N/A 02/02/2016   Procedure: TRANSESOPHAGEAL ECHOCARDIOGRAM (TEE);  Surgeon: Grace Isaac, MD;  Location: [place];  Service: Open Heart Surgery;  Laterality: N/A;    Allergies  Allergen Reactions  . Statins Other (See Comments)    Severe myalgias    Current Outpatient Prescriptions  Medication Sig Dispense Refill  . allopurinol (ZYLOPRIM) 100  MG tablet Take 100 mg by mouth daily.     Marland Kitchen aspirin EC 81 MG EC tablet Take 1 tablet (81 mg total) by mouth daily.    . colchicine 0.6 MG tablet     . Glucosamine Sulfate (GLUCOSAMINE RELIEF) 1000 MG TABS Take 2,000 mg by mouth daily.    . isosorbide mononitrate (IMDUR) 30 MG 24 hr tablet Take 1 tablet (30 mg total) by mouth daily. 90 tablet 3  . metoprolol tartrate (LOPRESSOR) 25 MG tablet TAKE 1 TABLET BY MOUTH TWICE A DAY 60 tablet 11  . pantoprazole (PROTONIX) 40 MG tablet Take 1 tablet (40 mg total) by mouth daily. 90 tablet 3  . rosuvastatin (CRESTOR) 5 MG tablet Take 1 tablet (5 mg total) by mouth daily. (Patient taking differently: Take 5 mg by mouth. ONLY TWO TIMES A WEEK) 30 tablet 3  . vitamin B-12 (CYANOCOBALAMIN) 500 MCG tablet Take 500 mcg by mouth daily.     No current facility-administered medications for this visit.     Review of Systems : See HPI for pertinent positives and negatives.  Physical Examination  Vitals:   06/21/17 1339 06/21/17 1341  BP: 110/64 114/74  Pulse: 67   Resp: 18   Temp: (!) 97 F (36.1 C)   TempSrc: Oral   SpO2: 97%   Weight: 199 lb (90.3 kg)   Height: 5\' 8"  (1.727 m)    Body mass index is 30.26 kg/m.  General: WDWN obese male in NAD GAIT:normal Eyes: PERRLA Pulmonary: Non-labored respirations at rest, CTAB, no rales, rhonchi, or wheezes.  Cardiac: regular rhythm, no detected murmur.  VASCULAR EXAM Carotid Bruits Right Left   Negative Negative   Abdominal aortic pulse is not palpable. Radial pulses are 2+ palpable and equal.   LE Pulses Right Left  POPLITEAL not palpable not palpable  POSTERIOR TIBIAL palpable palpable  DORSALIS PEDIS ANTERIOR TIBIAL palpable palpable    Gastrointestinal: soft, nontender, BS WNL, no r/g, no palpable masses.  Musculoskeletal: no muscle atrophy/wasting. M/S 5/5 throughout, extremities without ischemic changes  Neurologic:  A&O X 3; Appropriate Affect, speech is normal, CN 2-12 intact except is hard of hearing, pain and light touch intact in extremities, Motor exam as listed above.    Assessment: Trevor Ward is a 76 y.o. male who is s/p right CEA on 04/21/15. He had a pre-opererative stoke on 04/11/15 as manifested by left facial drooping and staggering. He was evaluated that evening at [facility] per wife. Wife states that MRI of his brain shows he had a small stroke. He denies any subsequent strokes.  He no longer staggers and no longer has the daily headache that he had before the CEA.  Fortunately he has never used tobacco, seems to well controlled DM. He takes a daily ASA  and statin.    DATA Carotid Duplex (06/21/17):  Right CEA site with <40% restenosis, and <40% stenosis of the left ICA. Bilateral subclavian arteries are multiphasic (normal).  Right vertebral artery flow is antegrade, left is abnormal/preocclusive.  Abnormal left vertebral artery flow since last exam on 06-15-16.    Plan: Follow-up in 1 year with Carotid Duplex scan.    I discussed in depth with the patient the nature of atherosclerosis, and emphasized the importance of maximal medical management including strict control of blood pressure, blood glucose, and lipid levels, obtaining regular exercise, and continued cessation of smoking.  The patient is aware that without maximal medical management the underlying atherosclerotic disease process will progress, limiting the benefit of any interventions. The patient was given information about stroke prevention and what symptoms should prompt the patient to seek immediate medical care. Thank you for allowing [place] to participate in this patient's care.  Clemon Chambers, RN, MSN, FNP-C [facility] Office: [place]: Oneida Alar  06/21/17 1:46 PM

## 2017-06-21 NOTE — Progress Notes (Signed)
[address]       [place] [address]             [phone]      Bradshaw A Albin [place] Medical Record #[id number] Date of Birth: 12-28-1940  Referring: Burnell Blanks* Primary Care: Mayra Neer, MD  Chief Complaint:   POST OP FOLLOW UP  History of Present Illness:     02/02/2016 OPERATIVE REPORT PREOPERATIVE DIAGNOSIS: Coronary occlusive disease with unstable angina. POSTOPERATIVE DIAGNOSIS: Coronary occlusive disease with unstable angina. SURGICAL PROCEDURE: Coronary artery bypass grafting x4 with left internal mammary to left anterior descending coronary artery, sequential reverse saphenous vein graft to the first and second intermediate reverse saphenous vein graft to the posterior descending with the left greater saphenous vein endoscopic harvesting left thigh. SURGEON: Lanelle Bal, MD.  Patient comes to the office today for follow-up CT of the chest . In May 2017 the patient had coronary artery bypass grafting. 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 Since last seen he said no recurrent angina or evidence of congestive heart failure.      Past Medical History:  Diagnosis Date  . Arthritis    "fingers" (02/01/2016)  . Depression   . Diabetes mellitus without complication ([place])   . GERD (gastroesophageal reflux disease)   . High cholesterol    "can't take RX; made my legs weak" (02/01/2016)  . History of gout   . History of hiatal hernia   . Hypertension   . Peripheral vascular disease ([place])    carotid artery stenosis  . Skin cancer of face     "the good kind"  . Stroke [facility]) 2016   denies residual on 02/01/2016     History  Smoking Status  . Former Smoker  . Years: 1.50  . Types: Cigarettes  . Start date: 01/28/1956  Smokeless Tobacco  . Never Used    Comment: "quit smoking cigarettes when I was 76 yr old"      History  Alcohol Use No     Allergies  Allergen Reactions  . Statins Other (See Comments)    Severe myalgias    Current Outpatient Prescriptions  Medication Sig Dispense Refill  . allopurinol (ZYLOPRIM) 100 MG tablet Take 100 mg by mouth daily.     Marland Kitchen aspirin EC 81 MG EC tablet Take 1 tablet (81 mg total) by mouth daily.    . colchicine 0.6 MG tablet     . Glucosamine Sulfate (GLUCOSAMINE RELIEF) 1000 MG TABS Take 2,000 mg by mouth daily.    . isosorbide mononitrate (IMDUR) 30 MG 24 hr tablet Take 1 tablet (30 mg total) by mouth daily. 90 tablet 3  . metoprolol tartrate (LOPRESSOR) 25 MG tablet TAKE 1 TABLET BY MOUTH TWICE A DAY 60 tablet 11  . pantoprazole (PROTONIX) 40 MG tablet Take 1 tablet (40 mg total) by mouth daily. 90 tablet 3  . rosuvastatin (CRESTOR) 5 MG tablet Take 1 tablet (5 mg total) by mouth daily. (Patient taking differently: Take 5 mg by mouth. ONLY TWO TIMES A WEEK) 30 tablet 3  . vitamin B-12 (CYANOCOBALAMIN) 500 MCG tablet Take 500 mcg by mouth daily.     No current facility-administered medications for this visit.        Physical Exam: BP 124/76 (BP Location: Right Arm, Patient Position: Sitting, Cuff Size: Large)   Pulse 65  Resp 16   Ht 5\' 8"  (1.727 m)   Wt 199 lb (90.3 kg)   SpO2 94% Comment: ON RA  BMI 30.26 kg/m   General appearance: alert, cooperative and no distress Neurologic: intact Heart: regular rate and rhythm, S1, S2 normal, no murmur, click, rub or gallop Lungs: clear to auscultation bilaterally Abdomen: soft, non-tender; bowel sounds normal; no masses,  no organomegaly Extremities: extremities normal, atraumatic, no cyanosis or edema and There's no evidence of phlebitis in either arm from previous IV sites or arterial line or cath sites, the left wrist is a slightly swollen compared the right Wound: Patient sternal since incision is well-healed there is no drainage or evidence of cellulitis   Diagnostic Studies & Laboratory  data:     Recent Radiology Findings:  Study Result   CLINICAL DATA:  Thoracic aortic aneurysm without rupture.  EXAM: CT ANGIOGRAPHY CHEST WITH CONTRAST  TECHNIQUE: Multidetector CT imaging of the chest was performed using the standard protocol during bolus administration of intravenous contrast. Multiplanar CT image reconstructions and MIPs were obtained to evaluate the vascular anatomy.  CONTRAST:  75 mL of Isovue 370 intravenously.  COMPARISON:  CT scan of June 26, 2016.  FINDINGS: Cardiovascular: Atherosclerosis of thoracic aorta is noted without dissection. Status post coronary artery bypass graft. Coronary artery calcifications are noted. On the current exam, the thoracic aorta has a maximum measured diameter 4.0 cm. This is not significantly changed compared to prior exam. No pericardial effusion is noted.  Mediastinum/Nodes: No enlarged mediastinal, hilar, or axillary lymph nodes. Thyroid gland, trachea, and esophagus demonstrate no significant findings.  Lungs/Pleura: No pneumothorax or pleural effusion is noted. Stable calcified and noncalcified pleural plaques are noted bilaterally. No acute pulmonary disease is noted.  Upper Abdomen: No acute abnormality.  Musculoskeletal: No chest wall abnormality. No acute or significant osseous findings.  Review of the MIP images confirms the above findings.  IMPRESSION: 4.0 cm ascending thoracic aortic aneurysm which is not significantly changed compared to prior exam. Recommend annual imaging followup by CTA or MRA. This recommendation follows 2010 ACCF/AHA/AATS/ACR/ASA/SCA/SCAI/SIR/STS/SVM Guidelines for the Diagnosis and Management of Patients with Thoracic Aortic Disease. Circulation. 2010; 121: G644-I347.  Status post coronary artery bypass graft.  Stable bilateral calcified and noncalcified pleural plaques are noted consistent with asbestos exposure.  Aortic Atherosclerosis  (ICD10-I70.0).   Electronically Signed   By: Marijo Conception, M.D.   On: 06/04/2017 09:47      I have independently reviewed the above radiology studies  and reviewed the findings with the patient.   Recent Lab Findings: Lab Results  Component Value Date   WBC 7.6 02/08/2016   HGB 11.2 (L) 02/08/2016   HCT 34.2 (L) 02/08/2016   PLT 275 02/08/2016   GLUCOSE 113 (H) 05/16/2016   CHOL 115 03/16/2017   TRIG 168 (H) 03/16/2017   HDL 23 (L) 03/16/2017   LDLCALC 58 03/16/2017   ALT 10 05/16/2016   AST 14 05/16/2016   NA 140 05/16/2016   K 4.7 05/16/2016   CL 102 05/16/2016   CREATININE 0.85 05/16/2016   BUN 8 05/16/2016   CO2 27 05/16/2016   TSH 2.55 01/31/2016   INR 1.41 02/02/2016   HGBA1C 6.4 (H) 02/02/2016   Ct: FROM MAY 2017 IMPRESSION: 1. Multiple calcified pleural plaques again noted bilaterally consistent with prior asbestos exposure. These are very similar in appearance to the prior CT in 2006 with potentially some interval increase calcifications in several of the plaques. No evidence to  suggest malignancy. 2. Chronic lung disease with bronchiolectasis in both lower lungs, right greater than left. No evidence to suggest progressive asbestosis. 3. Coronary atherosclerosis. 4. Mildly dilated ascending thoracic aorta measuring 4.1-4.2 cm. Recommend annual imaging followup by CTA or MRA. This recommendation follows 2010 ACCF/AHA/AATS/ACR/ASA/SCA/SCAI/SIR/STS/SVM Guidelines for the Diagnosis and Management of Patients with Thoracic Aortic Disease. Circulation. 2010; 121: K352-Y818   Assessment / Plan:    1/ 4.0 cm ascending thoracic aortic aneurysm which is not significantly changed compared to prior exam. -Follow-up scan in 18 months both to evaluate his ascending aorta and the status of his extensive pleural plaques with known asbestos exposure. 2/ Status post coronary artery bypass graft.  3/ Stable bilateral calcified and noncalcified pleural plaques  are noted consistent with asbestos exposure.   Grace Isaac MD      [employer] [place] [address] Office [phone]   Beeper [phone]  06/21/2017 5:07 PM

## 2017-06-21 NOTE — Patient Instructions (Signed)
Stroke Prevention Some medical conditions and behaviors are associated with an increased chance of having a stroke. You may prevent a stroke by making healthy choices and managing medical conditions. How can I reduce my risk of having a stroke?  Stay physically active. Get at least 30 minutes of activity on most or all days.  Do not smoke. It may also be helpful to avoid exposure to secondhand smoke.  Limit alcohol use. Moderate alcohol use is considered to be: ? No more than 2 drinks per day for men. ? No more than 1 drink per day for nonpregnant women.  Eat healthy foods. This involves: ? Eating 5 or more servings of fruits and vegetables a day. ? Making dietary changes that address high blood pressure (hypertension), high cholesterol, diabetes, or obesity.  Manage your cholesterol levels. ? Making food choices that are high in fiber and low in saturated fat, trans fat, and cholesterol may control cholesterol levels. ? Take any prescribed medicines to control cholesterol as directed by your health care provider.  Manage your diabetes. ? Controlling your carbohydrate and sugar intake is recommended to manage diabetes. ? Take any prescribed medicines to control diabetes as directed by your health care provider.  Control your hypertension. ? Making food choices that are low in salt (sodium), saturated fat, trans fat, and cholesterol is recommended to manage hypertension. ? Ask your health care provider if you need treatment to lower your blood pressure. Take any prescribed medicines to control hypertension as directed by your health care provider. ? If you are 18-39 years of age, have your blood pressure checked every 3-5 years. If you are 40 years of age or older, have your blood pressure checked every year.  Maintain a healthy weight. ? Reducing calorie intake and making food choices that are low in sodium, saturated fat, trans fat, and cholesterol are recommended to manage  weight.  Stop drug abuse.  Avoid taking birth control pills. ? Talk to your health care provider about the risks of taking birth control pills if you are over 35 years old, smoke, get migraines, or have ever had a blood clot.  Get evaluated for sleep disorders (sleep apnea). ? Talk to your health care provider about getting a sleep evaluation if you snore a lot or have excessive sleepiness.  Take medicines only as directed by your health care provider. ? For some people, aspirin or blood thinners (anticoagulants) are helpful in reducing the risk of forming abnormal blood clots that can lead to stroke. If you have the irregular heart rhythm of atrial fibrillation, you should be on a blood thinner unless there is a good reason you cannot take them. ? Understand all your medicine instructions.  Make sure that other conditions (such as anemia or atherosclerosis) are addressed. Get help right away if:  You have sudden weakness or numbness of the face, arm, or leg, especially on one side of the body.  Your face or eyelid droops to one side.  You have sudden confusion.  You have trouble speaking (aphasia) or understanding.  You have sudden trouble seeing in one or both eyes.  You have sudden trouble walking.  You have dizziness.  You have a loss of balance or coordination.  You have a sudden, severe headache with no known cause.  You have new chest pain or an irregular heartbeat. Any of these symptoms may represent a serious problem that is an emergency. Do not wait to see if the symptoms will go away.   Get medical help at once. Call your local emergency services (911 in [place] Do not drive yourself to the hospital. This information is not intended to replace advice given to you by your health care provider. Make sure you discuss any questions you have with your health care provider. Document Released: 10/19/2004 Document Revised: 02/17/2016 Document Reviewed: 03/14/2013 Elsevier  Interactive Patient Education  2017 Elsevier Inc.     Preventing Cerebrovascular Disease Arteries are blood vessels that carry blood that contains oxygen from the heart to all parts of the body. Cerebrovascular disease affects arteries that supply the brain. Any condition that blocks or disrupts blood flow to the brain can cause cerebrovascular disease. Brain cells that lose blood supply start to die within minutes (stroke). Stroke is the main danger of cerebrovascular disease. Atherosclerosis and high blood pressure are common causes of cerebrovascular disease. Atherosclerosis is narrowing and hardening of an artery that results when fat, cholesterol, calcium, or other substances (plaque) build up inside an artery. Plaque reduces blood flow through the artery. High blood pressure increases the risk of bleeding inside the brain. Making diet and lifestyle changes to prevent atherosclerosis and high blood pressure lowers your risk of cerebrovascular disease. What nutrition changes can be made?  Eat more fruits, vegetables, and whole grains.  Reduce how much saturated fat you eat. To do this, eat less red meat and fewer full-fat dairy products.  Eat healthy proteins instead of red meat. Healthy proteins include: ? Fish. Eat fish that contains heart-healthy omega-3 fatty acids, twice a week. Examples include salmon, albacore tuna, mackerel, and herring. ? Chicken. ? Nuts. ? Low-fat or nonfat yogurt.  Avoid processed meats, like bacon and lunchmeat.  Avoid foods that contain: ? A lot of sugar, such as sweets and drinks with added sugar. ? A lot of salt (sodium). Avoid adding extra salt to your food, as told by your health care provider. ? Trans fats, such as margarine and baked goods. Trans fats may be listed as "partially hydrogenated oils" on food labels.  Check food labels to see how much sodium, sugar, and trans fats are in foods.  Use vegetable oils that contain low amounts of  saturated fat, such as olive oil or canola oil. What lifestyle changes can be made?  Drink alcohol in moderation. This means no more than 1 drink a day for nonpregnant women and 2 drinks a day for men. One drink equals 12 oz of beer, 5 oz of wine, or 1 oz of hard liquor.  If you are overweight, ask your health care provider to recommend a weight-loss plan for you. Losing 5-10 lb (2.2-4.5 kg) can reduce your risk of diabetes, atherosclerosis, and high blood pressure.  Exercise for 30?60 minutes on most days, or as much as told by your health care provider. ? Do moderate-intensity exercise, such as brisk walking, bicycling, and water aerobics. Ask your health care provider which activities are safe for you.  Do not use any products that contain nicotine or tobacco, such as cigarettes and e-cigarettes. If you need help quitting, ask your health care provider. Why are these changes important? Making these changes lowers your risk of many diseases that can cause cerebrovascular disease and stroke. Stroke is a leading cause of death and disability. Making these changes also improves your overall health and quality of life. What can I do to lower my risk? The following factors make you more likely to develop cerebrovascular disease:  Being overweight.  Smoking.  Being physically inactive.    Eating a high-fat diet.  Having certain health conditions, such as: ? Diabetes. ? High blood pressure. ? Heart disease. ? Atherosclerosis. ? High cholesterol. ? Sickle cell disease.  Talk with your health care provider about your risk for cerebrovascular disease. Work with your health care provider to control diseases that you have that may contribute to cerebrovascular disease. Your health care provider may prescribe medicines to help prevent major causes of cerebrovascular disease. Where to find more information: Learn more about preventing cerebrovascular disease from:  National Heart, Lung, and  Blood Institute: www.nhlbi.nih.gov/health/health-topics/topics/stroke  Centers for Disease Control and Prevention: cdc.gov/stroke/about.htm  Summary  Cerebrovascular disease can lead to a stroke.  Atherosclerosis and high blood pressure are major causes of cerebrovascular disease.  Making diet and lifestyle changes can reduce your risk of cerebrovascular disease.  Work with your health care provider to get your risk factors under control to reduce your risk of cerebrovascular disease. This information is not intended to replace advice given to you by your health care provider. Make sure you discuss any questions you have with your health care provider. Document Released: 09/26/2015 Document Revised: 03/31/2016 Document Reviewed: 09/26/2015 Elsevier Interactive Patient Education  2018 Elsevier Inc.  

## 2017-06-25 DIAGNOSIS — E1165 Type 2 diabetes mellitus with hyperglycemia: Secondary | ICD-10-CM | POA: Diagnosis not present

## 2017-07-09 DIAGNOSIS — E119 Type 2 diabetes mellitus without complications: Secondary | ICD-10-CM | POA: Diagnosis not present

## 2017-07-09 DIAGNOSIS — H353111 Nonexudative age-related macular degeneration, right eye, early dry stage: Secondary | ICD-10-CM | POA: Diagnosis not present

## 2017-07-09 DIAGNOSIS — H2513 Age-related nuclear cataract, bilateral: Secondary | ICD-10-CM | POA: Diagnosis not present

## 2017-08-21 ENCOUNTER — Other Ambulatory Visit: Payer: Self-pay | Admitting: Cardiovascular Disease

## 2017-08-22 NOTE — Addendum Note (Signed)
Addended by: Lianne Cure A on: 08/22/2017 11:30 AM   Modules accepted: Orders

## 2017-11-01 DIAGNOSIS — B349 Viral infection, unspecified: Secondary | ICD-10-CM | POA: Diagnosis not present

## 2017-11-01 DIAGNOSIS — R05 Cough: Secondary | ICD-10-CM | POA: Diagnosis not present

## 2017-11-26 DIAGNOSIS — I10 Essential (primary) hypertension: Secondary | ICD-10-CM | POA: Diagnosis not present

## 2017-11-26 DIAGNOSIS — E119 Type 2 diabetes mellitus without complications: Secondary | ICD-10-CM | POA: Diagnosis not present

## 2017-11-26 DIAGNOSIS — E782 Mixed hyperlipidemia: Secondary | ICD-10-CM | POA: Diagnosis not present

## 2017-11-29 DIAGNOSIS — I10 Essential (primary) hypertension: Secondary | ICD-10-CM | POA: Diagnosis not present

## 2018-01-18 ENCOUNTER — Telehealth: Payer: Self-pay | Admitting: Cardiovascular Disease

## 2018-01-18 NOTE — Telephone Encounter (Signed)
Spoke to Ms. Camarena, she states patient switch to [employer] order. She states patient does not need any medication at present. RN informed wife  Call office when patient is ready for a refill. She states that Mcarthur Rossetti will be sending a fax as well.  Office will refill when obtained she voiced understanding

## 2018-01-18 NOTE — Telephone Encounter (Signed)
New Messsage:   Pt wants to make sure all his prescriptions and refills be sent to [place] changed pharmacy.

## 2018-01-22 ENCOUNTER — Other Ambulatory Visit: Payer: Self-pay | Admitting: *Deleted

## 2018-01-22 MED ORDER — METOPROLOL TARTRATE 25 MG PO TABS: 25.0000 mg | ORAL_TABLET | Freq: Two times a day (BID) | ORAL | 0 refills | Status: DC

## 2018-01-22 MED ORDER — ISOSORBIDE MONONITRATE ER 30 MG PO TB24: 30.0000 mg | ORAL_TABLET | Freq: Every day | ORAL | 1 refills | Status: DC

## 2018-01-28 ENCOUNTER — Other Ambulatory Visit: Payer: Self-pay | Admitting: *Deleted

## 2018-01-28 MED ORDER — ISOSORBIDE MONONITRATE ER 30 MG PO TB24: 30.0000 mg | ORAL_TABLET | Freq: Every day | ORAL | 1 refills | Status: DC

## 2018-01-28 MED ORDER — METOPROLOL TARTRATE 25 MG PO TABS: 25.0000 mg | ORAL_TABLET | Freq: Two times a day (BID) | ORAL | 0 refills | Status: DC

## 2018-02-05 DIAGNOSIS — R42 Dizziness and giddiness: Secondary | ICD-10-CM | POA: Diagnosis not present

## 2018-02-11 ENCOUNTER — Encounter: Payer: Self-pay | Admitting: Adult Health

## 2018-02-11 ENCOUNTER — Ambulatory Visit (INDEPENDENT_AMBULATORY_CARE_PROVIDER_SITE_OTHER): Payer: Medicare HMO | Admitting: Adult Health

## 2018-02-11 VITALS — BP 122/82 | HR 68 | Ht 68.0 in | Wt 198.8 lb

## 2018-02-11 DIAGNOSIS — I714 Abdominal aortic aneurysm, without rupture, unspecified: Secondary | ICD-10-CM

## 2018-02-11 DIAGNOSIS — E7800 Pure hypercholesterolemia, unspecified: Secondary | ICD-10-CM

## 2018-02-11 DIAGNOSIS — I10 Essential (primary) hypertension: Secondary | ICD-10-CM | POA: Diagnosis not present

## 2018-02-11 DIAGNOSIS — I712 Thoracic aortic aneurysm, without rupture, unspecified: Secondary | ICD-10-CM

## 2018-02-11 DIAGNOSIS — I25118 Atherosclerotic heart disease of native coronary artery with other forms of angina pectoris: Secondary | ICD-10-CM | POA: Diagnosis not present

## 2018-02-11 NOTE — Patient Instructions (Signed)
Medication Instructions:  NO CHANGES- Your physician recommends that you continue on your current medications as directed. Please refer to the Current Medication list given to you today.  If you need a refill on your cardiac medications before your next appointment, please call your pharmacy.  Testing/Procedures: Please arrive at the [facility] main entrance of [facility] at       (30-45 minutes prior to test start time)  [facility]  [place], [place] [address]  [phone]  >Proceed to the [facility] Radiology Department (First Floor).  >Please follow these instructions carefully (unless otherwise directed):  >Hold all erectile dysfunction medications at least 48 hours prior to test.   On the Night Before the Test:  - Drink plenty of water.  - Do not consume any caffeinated/decaffeinated beverages or chocolate 12 hours prior to your test.  - Do not take any antihistamines 12 hours prior to your test.  - If you take Metformin do not take 24 hours prior to test.  - If the patient has contrast allergy:  > Patient will need a prescription for Prednisone and very clear instructions (as follows):  - Prednisone 50 mg - take 13 hours prior to test  - Take another Prednisone 50 mg 7 hours prior to test  - Take another Prednisone 50 mg 1 hour prior to test  - Take Benadryl 50 mg 1 hour prior to test  - Patient must complete all four doses of above prophylactic medications.  - Patient will need a ride after test due to Benadryl.   On the Day of the Test:  - Drink plenty of water. Do not drink any water within one hour of the test.  - Do not eat any food 4 hours prior to the test.  - You may take your regular medications prior to the test.  - IF NOT ON A BETA BLOCKER - Take 50 mg of lopressor (metoprolol) one hour before the test.  - HOLD Furosemide morning of the test.   After the Test:  - Drink plenty of water.  - After receiving IV contrast, you  may experience a mild flushed feeling. This is normal.  - On occasion, you may experience a mild rash up to 24 hours after the test. This is not dangerous. If this occurs, you can take Benadryl 25 mg and increase your fluid intake.  - If you experience trouble breathing, this can be serious. If it is severe call 911 IMMEDIATELY. If it is mild, please call our office.  - If you take any of these medications: Glipizide/Metformin, Avandament, Glucavance, please do not take 48 hours after completing test.   Follow-Up: Your physician wants you to follow-up in: [place] should receive a reminder letter in the mail two months in advance. If you do not receive a letter, please call our office MARCH 2020 to schedule the MAY 2020 follow-up appointment.   Thank you for choosing [facility] at [facility]!!

## 2018-02-11 NOTE — Progress Notes (Signed)
Cardiology Office Note   Date:  02/11/2018   ID:  Trevor Ward, DOB Jun 23, 1941, MRN [id number]  PCP:  Mayra Neer, MD  Cardiologist: Cro[place]  Chief Complaint  Patient presents with  . Coronary Artery Disease  . AAA     History of Present Illness: Trevor Ward is a 77 y.o. male who presents for ongoing assessment and management of coronary artery disease with history of CABG x4 (LIMA to LAD, SVG to sequential to the first and second intermediate, and SVG to PDA in 2017.  The patient was last seen in the office on 02/12/2017 and was without any cardiac complaints, with medically compliant.  On follow-up with Dr. 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  Other history includes AAA, 4.0 cm ascending thoracic aortic aneurysm (per CT scan 06/2017), diabetes, hypertension, hypercholesterolemia, carotid artery stenosis, hiatal hernia and depression.  He comes today without cardiac complaints. He remains active, he denies DOE, exertional chest pain, or fatigue.  He is medically compliant.   Past Medical History:  Diagnosis Date  . Arthritis    "fingers" (02/01/2016)  . Depression   . Diabetes mellitus without complication ([place])   . GERD (gastroesophageal reflux disease)   . High cholesterol    "can't take RX; made my legs weak" (02/01/2016)  . History of gout   . History of hiatal hernia   . Hypertension   . Peripheral vascular disease ([place])    carotid artery stenosis  . Skin cancer of face     "the good kind"  . Stroke [facility]) 2016   denies residual on 02/01/2016    Past Surgical History:  Procedure Laterality Date  . CARDIAC CATHETERIZATION N/A 02/01/2016   Procedure: Left Heart Cath and Coronary Angiography;  Surgeon: Burnell Blanks, MD;  Location: [place] CV LAB;  Service: Cardiovascular;  Laterality: N/A;  .  COLONOSCOPY    . CORONARY ARTERY BYPASS GRAFT N/A 02/02/2016   Procedure: CORONARY ARTERY BYPASS GRAFTING (CABG) times 4 using left internal mammary and left saphenous ven harvested by endoven ;  Surgeon: Grace Isaac, MD;  Location: [place];  Service: Open Heart Surgery;  Laterality: N/A;  . ENDARTERECTOMY Right 04/21/2015   Procedure: RIGHT CAROTID ENDARTERECTOMY;  Surgeon: Elam Dutch, MD;  Location: [place];  Service: Vascular;  Laterality: Right;  . ESOPHAGOGASTRODUODENOSCOPY (EGD) WITH PROPOFOL N/A 01/03/2016   Procedure: ESOPHAGOGASTRODUODENOSCOPY (EGD) WITH PROPOFOL;  Surgeon: Garlan Fair, MD;  [facility];  Service: Endoscopy;  Laterality: N/A;  . KNEE ARTHROSCOPY Right   . open heart surgery  01/2016  . PERIPHERAL VASCULAR CATHETERIZATION Bilateral 04/14/2015   Procedure: Carotid Angiography;  Surgeon: Serafina Mitchell, MD;  Location: [place] CV LAB;  Service: Cardiovascular;  Laterality: Bilateral;  . SKIN CANCER EXCISION Right    "temple"  . TEE WITHOUT CARDIOVERSION N/A 02/02/2016   Procedure: TRANSESOPHAGEAL ECHOCARDIOGRAM (TEE);  Surgeon: Grace Isaac, MD;  Location: [place];  Service: Open Heart Surgery;  Laterality: N/A;     Current Outpatient Medications  Medication Sig Dispense Refill  . allopurinol (ZYLOPRIM) 100 MG tablet Take 100 mg by mouth daily.     Marland Kitchen aspirin EC 81 MG EC tablet Take 1 tablet (81 mg total) by mouth daily.    . colchicine 0.6 MG tablet     . Glucosamine  Sulfate (GLUCOSAMINE RELIEF) 1000 MG TABS Take 2,000 mg by mouth daily.    . isosorbide mononitrate (IMDUR) 30 MG 24 hr tablet Take 1 tablet (30 mg total) by mouth daily. KEEP OV. 90 tablet 1  . meclizine (ANTIVERT) 25 MG tablet TAKE 1 TABLET BY MOUTH 4 TIMES A DAY AS NEEDED FOR DIZZINESS  0  . metoprolol tartrate (LOPRESSOR) 25 MG tablet Take 1 tablet (25 mg total) by mouth 2 (two) times daily. KEEP OV. 180 tablet 0  . pantoprazole (PROTONIX) 40 MG tablet Take 1 tablet (40 mg  total) by mouth daily. 90 tablet 3  . rosuvastatin (CRESTOR) 5 MG tablet Take 1 tablet (5 mg total) by mouth daily. (Patient taking differently: Take 5 mg by mouth. ONLY TWO TIMES A WEEK) 30 tablet 3  . vitamin B-12 (CYANOCOBALAMIN) 500 MCG tablet Take 500 mcg by mouth daily.     No current facility-administered medications for this visit.     Allergies:   Statins    Social History:  The patient  reports that he has quit smoking. His smoking use included cigarettes. He started smoking about 62 years ago. He quit after 1.50 years of use. He has never used smokeless tobacco. He reports that he does not drink alcohol or use drugs.   Family History:  [name] family history includes Arthritis in his mother; Breast cancer in his mother; CAD in his brother, brother, and mother; CVA in his brother, mother, and sister; Diabetes in his brother, brother, and mother; Heart attack in his father; Parkinson's disease in his mother.    ROS: All other systems are reviewed and negative. Unless otherwise mentioned in H&P    PHYSICAL EXAM: VS:  BP 122/82 (BP Location: Left Arm)   Pulse 68   Ht 5\' 8"  (1.727 m)   Wt 198 lb 12.8 oz (90.2 kg)   BMI 30.23 kg/m  , BMI Body mass index is 30.23 kg/m. GEN: Well nourished, well developed, in no acute distress  HEENT: normal  Neck: no JVD, carotid bruits, or masses Cardiac: RRR; no murmurs, rubs, or gallops,no edema  Respiratory: Crackles in the bases. GI: soft, nontender, nondistended, + BS MS: no deformity or atrophy  Skin: warm and dry, no rash Neuro:  Strength and sensation are intact Psych: euthymic mood, full affect   EKG: NSR rate of 68 bpm   Recent Labs: No results found for requested labs within last 8760 hours.    Lipid Panel    Component Value Date/Time   CHOL 115 03/16/2017 0855   TRIG 168 (H) 03/16/2017 0855   HDL 23 (L) 03/16/2017 0855   CHOLHDL 5.0 03/16/2017 0855   CHOLHDL 6.6 (H) 09/27/2016 1056   VLDL 26 09/27/2016 1056     LDLCALC 58 03/16/2017 0855      Wt Readings from Last 3 Encounters:  02/11/18 198 lb 12.8 oz (90.2 kg)  06/21/17 199 lb (90.3 kg)  06/21/17 199 lb (90.3 kg)      Other studies Reviewed:  Echocardiogram 02/02/2016  Left ventricle: The cavity size was normal. Wall thickness was   normal. The estimated ejection fraction was = 60%. There was no   dynamic obstruction. Wall motion was normal; there were no   regional wall motion abnormalities. - Aortic valve: Cusp separation was reduced. No evidence of   vegetation. There was trivial regurgitation directed centrally in   the LVOT. There was no significant perivalvular regurgitation. - Mitral valve: No evidence of vegetation. - Left  atrium: No evidence of thrombus in the atrial cavity or   appendage. - Right atrium: No evidence of thrombus in the atrial cavity or   appendage. - Atrial septum: The septum was thickened. No defect or patent   foramen ovale was identified. - Tricuspid valve: No evidence of vegetation.  CT scan  Chest: 06/04/2018   4.0 cm ascending thoracic aortic aneurysm which is not significantly changed compared to prior exam. Recommend annual imaging followup by CTA or MRA. This recommendation follows 2010 ACCF/AHA/AATS/ACR/ASA/SCA/SCAI/SIR/STS/SVM Guidelines for the Diagnosis and Management of Patients with Thoracic Aortic Disease. Circulation. 2010; 121: V035-K093.  Status post coronary artery bypass graft.  Stable bilateral calcified and noncalcified pleural plaques are noted consistent with asbestos exposure.  ASSESSMENT AND PLAN:  1.  CAD: Hx of CABG in 2017. He is doing well and without complaints. He remains medically complaint. He remains on BB, ASA, and statin therapy.   2. AAA: Last CT scan was in 2018 and revealed size of AAA 4.0. Will be due for repeat CT in October 2019 for surveillance.    3. Hypertension: BP is well controlled. Labs are completed by PCP  4. Hypercholesterolemia: Continue  statin therapy. Labs are ordered by PCP. I reviewed most recent labs.fron 03/12/2017. Well controlled.    Current medicines are reviewed at length with the patient today.    Labs/ tests ordered today include: None   Trevor Ward, ANP, AACC   02/11/2018 2:22 PM    [facility] [address], [place], [place] [address] Phone: [phone]; Fax: [phone]

## 2018-04-22 DIAGNOSIS — L578 Other skin changes due to chronic exposure to nonionizing radiation: Secondary | ICD-10-CM | POA: Diagnosis not present

## 2018-04-22 DIAGNOSIS — L570 Actinic keratosis: Secondary | ICD-10-CM | POA: Diagnosis not present

## 2018-05-06 DIAGNOSIS — F411 Generalized anxiety disorder: Secondary | ICD-10-CM | POA: Diagnosis not present

## 2018-05-06 DIAGNOSIS — F4321 Adjustment disorder with depressed mood: Secondary | ICD-10-CM | POA: Diagnosis not present

## 2018-05-23 DIAGNOSIS — L570 Actinic keratosis: Secondary | ICD-10-CM | POA: Diagnosis not present

## 2018-05-28 ENCOUNTER — Other Ambulatory Visit: Payer: Self-pay | Admitting: Cardiovascular Disease

## 2018-06-03 DIAGNOSIS — Z0000 Encounter for general adult medical examination without abnormal findings: Secondary | ICD-10-CM | POA: Diagnosis not present

## 2018-06-03 DIAGNOSIS — M109 Gout, unspecified: Secondary | ICD-10-CM | POA: Diagnosis not present

## 2018-06-03 DIAGNOSIS — E782 Mixed hyperlipidemia: Secondary | ICD-10-CM | POA: Diagnosis not present

## 2018-06-03 DIAGNOSIS — I700 Atherosclerosis of aorta: Secondary | ICD-10-CM | POA: Diagnosis not present

## 2018-06-03 DIAGNOSIS — R7301 Impaired fasting glucose: Secondary | ICD-10-CM | POA: Diagnosis not present

## 2018-06-03 DIAGNOSIS — I119 Hypertensive heart disease without heart failure: Secondary | ICD-10-CM | POA: Diagnosis not present

## 2018-06-03 DIAGNOSIS — F411 Generalized anxiety disorder: Secondary | ICD-10-CM | POA: Diagnosis not present

## 2018-06-03 DIAGNOSIS — I779 Disorder of arteries and arterioles, unspecified: Secondary | ICD-10-CM | POA: Diagnosis not present

## 2018-06-03 DIAGNOSIS — I712 Thoracic aortic aneurysm, without rupture: Secondary | ICD-10-CM | POA: Diagnosis not present

## 2018-06-11 ENCOUNTER — Other Ambulatory Visit: Payer: Self-pay | Admitting: Family Medicine

## 2018-06-11 DIAGNOSIS — I712 Thoracic aortic aneurysm, without rupture, unspecified: Secondary | ICD-10-CM

## 2018-06-19 ENCOUNTER — Other Ambulatory Visit: Payer: Medicare HMO

## 2018-06-19 ENCOUNTER — Ambulatory Visit
Admission: RE | Admit: 2018-06-19 | Discharge: 2018-06-19 | Disposition: A | Discharge status: Home / Self Care | Payer: Medicare HMO | Source: Ambulatory Visit | Attending: Family Medicine | Admitting: Family Medicine

## 2018-06-19 DIAGNOSIS — I712 Thoracic aortic aneurysm, without rupture, unspecified: Secondary | ICD-10-CM

## 2018-06-19 MED ORDER — IOPAMIDOL (ISOVUE-370) INJECTION 76%
75.0000 mL | Freq: Once | INTRAVENOUS | Status: AC | PRN
  Administered 2018-06-19: 75 mL via INTRAVENOUS

## 2018-08-14 ENCOUNTER — Other Ambulatory Visit: Payer: Self-pay | Admitting: Cardiovascular Disease

## 2018-10-24 ENCOUNTER — Other Ambulatory Visit: Payer: Self-pay | Admitting: *Deleted

## 2018-10-24 DIAGNOSIS — I712 Thoracic aortic aneurysm, without rupture, unspecified: Secondary | ICD-10-CM

## 2018-12-19 ENCOUNTER — Other Ambulatory Visit: Payer: Medicare HMO

## 2018-12-19 ENCOUNTER — Ambulatory Visit: Payer: Medicare HMO | Admitting: Cardiothoracic Surgery

## 2019-01-28 ENCOUNTER — Telehealth: Payer: Self-pay | Admitting: *Deleted

## 2019-01-28 NOTE — Telephone Encounter (Signed)
01/28/19 LMOM @ 0929 am,re: follow up appointment.

## 2019-02-04 ENCOUNTER — Telehealth: Payer: Self-pay | Admitting: Cardiovascular Disease

## 2019-02-04 NOTE — Telephone Encounter (Signed)
12 month recall.  Left message to call back to schedule.

## 2019-02-21 ENCOUNTER — Telehealth: Payer: Self-pay | Admitting: *Deleted

## 2019-02-21 NOTE — Telephone Encounter (Signed)

## 2019-02-24 ENCOUNTER — Telehealth (INDEPENDENT_AMBULATORY_CARE_PROVIDER_SITE_OTHER): Payer: Medicare HMO | Admitting: Cardiovascular Disease

## 2019-02-24 ENCOUNTER — Encounter: Payer: Self-pay | Admitting: Cardiovascular Disease

## 2019-02-24 DIAGNOSIS — Z8673 Personal history of transient ischemic attack (TIA), and cerebral infarction without residual deficits: Secondary | ICD-10-CM | POA: Diagnosis not present

## 2019-02-24 DIAGNOSIS — I712 Thoracic aortic aneurysm, without rupture: Secondary | ICD-10-CM | POA: Diagnosis not present

## 2019-02-24 DIAGNOSIS — I700 Atherosclerosis of aorta: Secondary | ICD-10-CM | POA: Diagnosis not present

## 2019-02-24 DIAGNOSIS — Z9889 Other specified postprocedural states: Secondary | ICD-10-CM | POA: Diagnosis not present

## 2019-02-24 DIAGNOSIS — I10 Essential (primary) hypertension: Secondary | ICD-10-CM | POA: Diagnosis not present

## 2019-02-24 DIAGNOSIS — Z951 Presence of aortocoronary bypass graft: Secondary | ICD-10-CM | POA: Diagnosis not present

## 2019-02-24 DIAGNOSIS — E785 Hyperlipidemia, unspecified: Secondary | ICD-10-CM

## 2019-02-24 DIAGNOSIS — I25118 Atherosclerotic heart disease of native coronary artery with other forms of angina pectoris: Secondary | ICD-10-CM | POA: Diagnosis not present

## 2019-02-24 DIAGNOSIS — I714 Abdominal aortic aneurysm, without rupture: Secondary | ICD-10-CM | POA: Diagnosis not present

## 2019-02-24 DIAGNOSIS — I7121 Aneurysm of the ascending aorta, without rupture: Secondary | ICD-10-CM

## 2019-02-24 DIAGNOSIS — I7143 Infrarenal abdominal aortic aneurysm, without rupture: Secondary | ICD-10-CM

## 2019-02-24 NOTE — Patient Instructions (Signed)

## 2019-02-24 NOTE — Progress Notes (Signed)
Virtual Visit via Video Note   This visit type was conducted due to national recommendations for restrictions regarding the COVID-19 Pandemic (e.g. social distancing) in an effort to limit this patient's exposure and mitigate transmission in our community.  Due to his co-morbid illnesses, this patient is at least at moderate risk for complications without adequate follow up.  This format is felt to be most appropriate for this patient at this time.  All issues noted in this document were discussed and addressed.  A limited physical exam was performed with this format.  Please refer to the patient's chart for his consent to telehealth for [facility].   Date:  02/26/2019   ID:  Trevor Ward, DOB 10/20/1940, MRN [id number]  Patient Location: Home Provider Location: Home  PCP:  Mayra Neer, MD  Cardiologist:  Kemal Amores Electrophysiologist:  None   Evaluation Performed:  Follow-Up Visit  Chief Complaint:  CAD  History of Present Illness:    Trevor Ward is a 78 y.o. male with a history of CAD s/p CABG (02/02/16: CABG x4 with LIMA to LAD, SVG sequential to the first and second intermediate, SVG to PDA, Ceasar Mons, MD). Additional medical problems include aortic atherosclerosis with small aneurysm of the ascending thoracic aorta, type 2 diabetes mellitus, dyslipidemia, hypertension, history of right middle cerebral artery stroke and right carotid endarterectomy, hiatal hernia and depression.  He has been doing quite well and denies any issues with chest discomfort either at rest or with activity.  He does sometimes feel "wobbly" when getting up first thing in the morning, sometimes feels "crazy in the head" when he is standing for a long time and has had a few falls, thankfully none of them with head injury. These are longstanding complaints and he has a standing prescription for meclizine.  He denies any dyspnea at rest with activity and has not had leg edema.  Last September his  CT angiogram of the aorta showed borderline enlargement of the ascending aorta at 3.9 cm. Once again were noted calcified pleural plaques and a stable subpleural right lower lobe lung nodule, possibly sequelae of previous work but has noticed this.  Glycemic control has been good with a hemoglobin A1c of 7.1% last September and an LDL cholesterol of 59.  Has a stubbornly low HDL of only 22 but all the other metabolic parameters are good.  The patient specifically denies any chest pain at rest exertion, dyspnea at rest or with exertion, orthopnea, paroxysmal nocturnal dyspnea, syncope, palpitations, focal neurological deficits, intermittent claudication, lower extremity edema, unexplained weight gain, cough, hemoptysis or wheezing.  The patient does not have symptoms concerning for COVID-19 infection (fever, chills, cough, or new shortness of breath).    Past Medical History:  Diagnosis Date  . Arthritis    "fingers" (02/01/2016)  . Depression   . Diabetes mellitus without complication ([place])   . GERD (gastroesophageal reflux disease)   . High cholesterol    "can't take RX; made my legs weak" (02/01/2016)  . History of gout   . History of hiatal hernia   . Hypertension   . Peripheral vascular disease ([place])    carotid artery stenosis  . Skin cancer of face     "the good kind"  . Stroke [facility]) 2016   denies residual on 02/01/2016   Past Surgical History:  Procedure Laterality Date  . CARDIAC CATHETERIZATION N/A 02/01/2016   Procedure: Left Heart Cath and Coronary Angiography;  Surgeon: Burnell Blanks, MD;  Location: [place] CV LAB;  Service: Cardiovascular;  Laterality: N/A;  . COLONOSCOPY    . CORONARY ARTERY BYPASS GRAFT N/A 02/02/2016   Procedure: CORONARY ARTERY BYPASS GRAFTING (CABG) times 4 using left internal mammary and left saphenous ven harvested by endoven ;  Surgeon: Grace Isaac, MD;  Location: [place];  Service: Open Heart Surgery;  Laterality: N/A;  . ENDARTERECTOMY  Right 04/21/2015   Procedure: RIGHT CAROTID ENDARTERECTOMY;  Surgeon: Elam Dutch, MD;  Location: [place];  Service: Vascular;  Laterality: Right;  . ESOPHAGOGASTRODUODENOSCOPY (EGD) WITH PROPOFOL N/A 01/03/2016   Procedure: ESOPHAGOGASTRODUODENOSCOPY (EGD) WITH PROPOFOL;  Surgeon: Garlan Fair, MD;  [facility];  Service: Endoscopy;  Laterality: N/A;  . KNEE ARTHROSCOPY Right   . open heart surgery  01/2016  . PERIPHERAL VASCULAR CATHETERIZATION Bilateral 04/14/2015   Procedure: Carotid Angiography;  Surgeon: Serafina Mitchell, MD;  Location: [place] CV LAB;  Service: Cardiovascular;  Laterality: Bilateral;  . SKIN CANCER EXCISION Right    "temple"  . TEE WITHOUT CARDIOVERSION N/A 02/02/2016   Procedure: TRANSESOPHAGEAL ECHOCARDIOGRAM (TEE);  Surgeon: Grace Isaac, MD;  Location: [place];  Service: Open Heart Surgery;  Laterality: N/A;     Current Meds  Medication Sig  . allopurinol (ZYLOPRIM) 100 MG tablet Take 100 mg by mouth daily.   Marland Kitchen aspirin EC 81 MG EC tablet Take 1 tablet (81 mg total) by mouth daily.  . colchicine 0.6 MG tablet   . Glucosamine Sulfate (GLUCOSAMINE RELIEF) 1000 MG TABS Take 2,000 mg by mouth daily.  . isosorbide mononitrate (IMDUR) 30 MG 24 hr tablet TAKE 1 TABLET DAILY. KEEP OFFICE VISIT.  Marland Kitchen meclizine (ANTIVERT) 25 MG tablet TAKE 1 TABLET BY MOUTH 4 TIMES A DAY AS NEEDED FOR DIZZINESS  . metoprolol tartrate (LOPRESSOR) 25 MG tablet TAKE 1 TABLET TWO TIMES DAILY. KEEP OFFICE VISIT.  Marland Kitchen pantoprazole (PROTONIX) 40 MG tablet Take 1 tablet (40 mg total) by mouth daily.  . rosuvastatin (CRESTOR) 5 MG tablet Take 1 tablet (5 mg total) by mouth daily.  . vitamin B-12 (CYANOCOBALAMIN) 500 MCG tablet Take 500 mcg by mouth daily.     Allergies:   Statins   Social History   Tobacco Use  . Smoking status: Former Smoker    Years: 1.50    Types: Cigarettes    Start date: 01/28/1956  . Smokeless tobacco: Never Used  . Tobacco comment: "quit smoking  cigarettes when I was 78 yr old"  Substance Use Topics  . Alcohol use: No    Alcohol/week: 0.0 standard drinks  . Drug use: No     Family Hx: [name] family history includes Arthritis in his mother; Breast cancer in his mother; CAD in his brother, brother, and mother; CVA in his brother, mother, and sister; Diabetes in his brother, brother, and mother; Heart attack in his father; Parkinson's disease in his mother.  ROS:   Please see the history of present illness.    All other systems reviewed and are negative.   Prior CV studies:   The following studies were reviewed today:  Labs from PCP  Labs/Other Tests and Data Reviewed:    EKG:  An ECG dated 02/11/2018 was personally reviewed today and demonstrated:  Normal sinus rhythm, minor nonspecific repolarization changes  Recent Labs: September 2019 hemoglobin 15.8, creatinine 1.3, hemoglobin A1c 7.1%, normal liver function tests  Recent Lipid Panel Lab Results  Component Value Date/Time   CHOL 115 03/16/2017 08:55 AM  TRIG 168 (H) 03/16/2017 08:55 AM   HDL 23 (L) 03/16/2017 08:55 AM   CHOLHDL 5.0 03/16/2017 08:55 AM   CHOLHDL 6.6 (H) 09/27/2016 10:56 AM   LDLCALC 58 03/16/2017 08:55 AM   September 2019 total cholesterol 110, HDL 22, LDL 59, triglycerides 142  Wt Readings from Last 3 Encounters:  02/24/19 202 lb (91.6 kg)  02/11/18 198 lb 12.8 oz (90.2 kg)  06/21/17 199 lb (90.3 kg)     Objective:    Vital Signs:  BP 138/78   Pulse 65   Ht 5\' 8"  (1.727 m)   Wt 202 lb (91.6 kg)   BMI 30.71 kg/m    VITAL SIGNS:  reviewed GEN:  no acute distress EYES:  sclerae anicteric, EOMI - Extraocular Movements Intact RESPIRATORY:  normal respiratory effort, symmetric expansion CARDIOVASCULAR:  no peripheral edema SKIN:  no rash, lesions or ulcers. MUSCULOSKELETAL:  no obvious deformities. NEURO:  alert and oriented x 3, no obvious focal deficit PSYCH:  normal affect  ASSESSMENT & PLAN:    1. CAD s/p CABG:  Asymptomatic, free of angina.  On aspirin and statin with well-controlled risk factors. 2. Asc Ao Aneurysm: relatively minor in size and stable.  Aortic atherosclerosis is present. 3. HLP: Excellent LDL cholesterol, very low HDL.  Encouraged physical activity. 4. Orthostatic hypotension: Some of his complaints sound much more consistent with orthostatic hypotension than they do with true vertigo.  The only medications he is currently taking which could worsen this are metoprolol and isosorbide.  The metoprolol is important to prevent enlargement of the aortic aneurysm.  If necessary, we can talk about weaning him off the isosorbide.  COVID-19 Education: The signs and symptoms of COVID-19 were discussed with the patient and how to seek care for testing (follow up with PCP or arrange E-visit).  The importance of social distancing was discussed today.  Time:   Today, I have spent 19 minutes with the patient with telehealth technology discussing the above problems.     Medication Adjustments/Labs and Tests Ordered: Current medicines are reviewed at length with the patient today.  Concerns regarding medicines are outlined above.   Tests Ordered: No orders of the defined types were placed in this encounter.   Medication Changes: No orders of the defined types were placed in this encounter.  Patient Instructions  Medication Instructions:  Your physician recommends that you continue on your current medications as directed. Please refer to the Current Medication list given to you today.  If you need a refill on your cardiac medications before your next appointment, please call your pharmacy.   Lab work: None ordered  Testing/Procedures: None ordered  Follow-Up: At [facility], you and your health needs are our priority.  As part of our continuing mission to provide you with exceptional heart care, we have created designated Provider Care Teams.  These Care Teams include your primary  Cardiologist (physician) and Advanced Practice Providers (APPs -  Physician Assistants and Nurse Practitioners) who all work together to provide you with the care you need, when you need it. You will need a follow up appointment in 12 months.  Please call our office 2 months in advance to schedule this appointment.  You may see Sanda Klein, MD or one of the following Advanced Practice Providers on your designated Care Team: Almyra Deforest, PA-C Fabian Sharp, [place]       Disposition:  Follow up 12 months  Signed, Sanda Klein, MD  02/26/2019 3:27 PM  [facility]

## 2019-02-27 ENCOUNTER — Ambulatory Visit: Payer: Medicare HMO | Admitting: Cardiothoracic Surgery

## 2019-02-27 ENCOUNTER — Other Ambulatory Visit: Payer: Medicare HMO

## 2019-06-19 ENCOUNTER — Other Ambulatory Visit: Payer: Self-pay

## 2019-06-19 ENCOUNTER — Ambulatory Visit: Payer: Medicare HMO | Admitting: Cardiothoracic Surgery

## 2019-06-19 ENCOUNTER — Ambulatory Visit
Admission: RE | Admit: 2019-06-19 | Discharge: 2019-06-19 | Disposition: A | Discharge status: Home / Self Care | Payer: Medicare HMO | Source: Ambulatory Visit | Attending: Cardiothoracic Surgery | Admitting: Cardiothoracic Surgery

## 2019-06-19 ENCOUNTER — Encounter: Payer: Self-pay | Admitting: Cardiothoracic Surgery

## 2019-06-19 DIAGNOSIS — I712 Thoracic aortic aneurysm, without rupture, unspecified: Secondary | ICD-10-CM | POA: Insufficient documentation

## 2019-06-19 MED ORDER — IOPAMIDOL (ISOVUE-370) INJECTION 76%
75.0000 mL | Freq: Once | INTRAVENOUS | Status: AC | PRN
  Administered 2019-06-19: 13:00:00 75 mL via INTRAVENOUS

## 2019-06-19 NOTE — Progress Notes (Signed)
[address]       [place] [address]             [phone]      Shameer A Galloway [facility] Medical Record [id number] Date of Birth: 03-24-1941  Referring: Burnell Blanks* Primary Care: Mayra Neer, MD  Chief Complaint:   POST OP FOLLOW UP  History of Present Illness:     02/02/2016 OPERATIVE REPORT PREOPERATIVE DIAGNOSIS: Coronary occlusive disease with unstable angina. POSTOPERATIVE DIAGNOSIS: Coronary occlusive disease with unstable angina. SURGICAL PROCEDURE: Coronary artery bypass grafting x4 with left internal mammary to left anterior descending coronary artery, sequential reverse saphenous vein graft to the first and second intermediate reverse saphenous vein graft to the posterior descending with the left greater saphenous vein endoscopic harvesting left thigh. SURGEON: Lanelle Bal, MD.  Patient comes to the office today for follow-up CT of the chest . In May 2017 the patient had coronary artery bypass grafting. 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 Since last seen he said no recurrent angina or evidence of congestive heart failure.       Past Medical History:  Diagnosis Date  . Arthritis    "fingers" (02/01/2016)  . Depression   . Diabetes mellitus without complication ([place])   . GERD (gastroesophageal reflux disease)   . High cholesterol    "can't take RX; made my legs weak" (02/01/2016)  . History of gout   . History of hiatal hernia   . Hypertension   . Peripheral vascular disease ([place])    carotid artery stenosis  . Skin cancer of face     "the good kind"  . Stroke [facility]) 2016   denies residual on 02/01/2016     Social History   Tobacco Use  Smoking Status Former Smoker  . Years: 1.50  . Types: Cigarettes  . Start date: 01/28/1956  Smokeless Tobacco Never Used  Tobacco Comment   "quit smoking cigarettes when  I was 78 yr old"    Social History   Substance and Sexual Activity  Alcohol Use No  . Alcohol/week: 0.0 standard drinks     Allergies  Allergen Reactions  . Statins Other (See Comments)    Severe myalgias    Current Outpatient Medications  Medication Sig Dispense Refill  . allopurinol (ZYLOPRIM) 100 MG tablet Take 100 mg by mouth daily.     Marland Kitchen amitriptyline (ELAVIL) 25 MG tablet TAKE 1 TABLET BY MOUTH EVERYDAY AT BEDTIME    . aspirin EC 81 MG EC tablet Take 1 tablet (81 mg total) by mouth daily.    Marland Kitchen buPROPion (WELLBUTRIN XL) 300 MG 24 hr tablet     . colchicine 0.6 MG tablet     . Glucosamine Sulfate (GLUCOSAMINE RELIEF) 1000 MG TABS Take 2,000 mg by mouth daily.    . isosorbide mononitrate (IMDUR) 30 MG 24 hr tablet TAKE 1 TABLET DAILY. KEEP OFFICE VISIT. 90 tablet 1  . meclizine (ANTIVERT) 25 MG tablet TAKE 1 TABLET BY MOUTH 4 TIMES A DAY AS NEEDED FOR DIZZINESS  0  . metoprolol tartrate (LOPRESSOR) 25 MG tablet TAKE 1 TABLET TWO TIMES DAILY. KEEP OFFICE VISIT. 180 tablet 3  . pantoprazole (PROTONIX) 40 MG tablet Take 1 tablet (40 mg total) by mouth daily. 90 tablet 3  . rosuvastatin (CRESTOR) 5 MG tablet Take 1 tablet (5 mg total) by mouth daily. 30 tablet  3  . vitamin B-12 (CYANOCOBALAMIN) 500 MCG tablet Take 500 mcg by mouth daily.     No current facility-administered medications for this visit.        Physical Exam: BP (!) 146/88 (BP Location: Left Arm, Patient Position: Sitting, Cuff Size: Normal)   Pulse 69   Temp (!) 97 F (36.1 C)   Resp 18   Ht 5\' 10"  (1.778 m)   Wt 198 lb (89.8 kg)   SpO2 95% Comment: RA  BMI 28.41 kg/m  General appearance: alert, cooperative, appears stated age and no distress Head: Normocephalic, without obvious abnormality, atraumatic, Patient is very hard of hearing Neck: no adenopathy, no carotid bruit, no JVD, supple, symmetrical, trachea midline and thyroid not enlarged, symmetric, no tenderness/mass/nodules Lymph nodes: Cervical,  supraclavicular, and axillary nodes normal. Resp: clear to auscultation bilaterally Cardio: regular rate and rhythm, S1, S2 normal, no murmur, click, rub or gallop GI: soft, non-tender; bowel sounds normal; no masses,  no organomegaly Extremities: extremities normal, atraumatic, no cyanosis or edema and Homans sign is negative, no sign of DVT Neurologic: Grossly normal with exception of hearing loss    Diagnostic Studies & Laboratory data:     Recent Radiology Findings:  T Ct Angio Chest Aorta W &/or Wo Contrast  Result Date: 06/19/2019 CLINICAL DATA:  78 year old male with a history of thoracic aortic aneurysm EXAM: CT ANGIOGRAPHY CHEST WITH CONTRAST TECHNIQUE: Multidetector CT imaging of the chest was performed using the standard protocol during bolus administration of intravenous contrast. Multiplanar CT image reconstructions and MIPs were obtained to evaluate the vascular anatomy. CONTRAST:  2mL ISOVUE-370 IOPAMIDOL (ISOVUE-370) INJECTION 76% COMPARISON:  June 19, 2018, most remote December 29, 2004 FINDINGS: Cardiovascular: Heart: No cardiomegaly. No pericardial fluid/thickening. Surgical changes of CABG with native coronary calcifications. Aorta: Minimal aortic valve calcifications. Estimated diameter of the ascending aorta 3.8 cm, perpendicular to the flow channel on image 63 of series 5. No dissection. Atherosclerotic changes of the aortic arch. Three vessel arch. Branch vessels are patent. Atherosclerotic changes of the descending thoracic aorta with no periaortic fluid or inflammatory changes. Pulmonary arteries: Diameter of the main pulmonary artery measures 22 mm. Contrast bolus timing is not optimized for evaluation for filling defects. Mediastinum/Nodes: Small lymph nodes of the mediastinum. Unremarkable appearance of the thoracic esophagus. Unremarkable thoracic inlet Lungs/Pleura: Redemonstration of bilateral calcified and noncalcified pleural plaques. No pleural effusion. No  pneumothorax. Motion artifact somewhat limits evaluation. Geographic gland glass opacity of the bilateral lungs without confluent or nodular airspace disease. Mild bronchial wall thickening without endobronchial debris. Right lower lobe nodule previously 6 mm, currently no more than 4 mm. Upper Abdomen: No acute finding of the upper abdomen. Musculoskeletal: No acute displaced fracture. Degenerative changes of the spine. Review of the MIP images confirms the above findings. IMPRESSION: Greatest diameter of the ascending aorta on the current CT estimated 3.8 cm. Atherosclerotic changes of the aorta. Aortic Atherosclerosis (ICD10-I70.0). Surgical changes of median sternotomy and CABG. Redemonstration of calcified and noncalcified pleural plaques, compatible with asbestosis related pleural disease, as well as emphysema. Emphysema (ICD10-J43.9). Signed, Dulcy Fanny. Dellia Nims, RPVI [facility] Radiology Electronically Signed   By: Corrie Mckusick D.O.   On: 06/19/2019 13:44   I have independently reviewed the above radiology studies  and reviewed the findings with the patient.   Study Result   CLINICAL DATA:  Thoracic aortic aneurysm without rupture.  EXAM: CT ANGIOGRAPHY CHEST WITH CONTRAST  TECHNIQUE: Multidetector CT imaging of  the chest was performed using the standard protocol during bolus administration of intravenous contrast. Multiplanar CT image reconstructions and MIPs were obtained to evaluate the vascular anatomy.  CONTRAST:  75 mL of Isovue 370 intravenously.  COMPARISON:  CT scan of June 26, 2016.  FINDINGS: Cardiovascular: Atherosclerosis of thoracic aorta is noted without dissection. Status post coronary artery bypass graft. Coronary artery calcifications are noted. On the current exam, the thoracic aorta has a maximum measured diameter 4.0 cm. This is not significantly changed compared to prior exam. No pericardial effusion is  noted.  Mediastinum/Nodes: No enlarged mediastinal, hilar, or axillary lymph nodes. Thyroid gland, trachea, and esophagus demonstrate no significant findings.  Lungs/Pleura: No pneumothorax or pleural effusion is noted. Stable calcified and noncalcified pleural plaques are noted bilaterally. No acute pulmonary disease is noted.  Upper Abdomen: No acute abnormality.  Musculoskeletal: No chest wall abnormality. No acute or significant osseous findings.  Review of the MIP images confirms the above findings.  IMPRESSION: 4.0 cm ascending thoracic aortic aneurysm which is not significantly changed compared to prior exam. Recommend annual imaging followup by CTA or MRA. This recommendation follows 2010 ACCF/AHA/AATS/ACR/ASA/SCA/SCAI/SIR/STS/SVM Guidelines for the Diagnosis and Management of Patients with Thoracic Aortic Disease. Circulation. 2010; [id number].  Status post coronary artery bypass graft.  Stable bilateral calcified and noncalcified pleural plaques are noted consistent with asbestos exposure.  Aortic Atherosclerosis (ICD10-I70.0).   Electronically Signed   By: Marijo Conception, M.D.   On: 06/04/2017 09:47      I have independently reviewed the above radiology studies  and reviewed the findings with the patient.   Recent Lab Findings: Lab Results  Component Value Date   WBC 7.6 02/08/2016   HGB 11.2 (L) 02/08/2016   HCT 34.2 (L) 02/08/2016   PLT 275 02/08/2016   GLUCOSE 113 (H) 05/16/2016   CHOL 115 03/16/2017   TRIG 168 (H) 03/16/2017   HDL 23 (L) 03/16/2017   LDLCALC 58 03/16/2017   ALT 10 05/16/2016   AST 14 05/16/2016   NA 140 05/16/2016   K 4.7 05/16/2016   CL 102 05/16/2016   CREATININE 0.85 05/16/2016   BUN 8 05/16/2016   CO2 27 05/16/2016   TSH 2.55 01/31/2016   INR 1.41 02/02/2016   HGBA1C 6.4 (H) 02/02/2016   Ct: FROM MAY 2017 IMPRESSION: 1. Multiple calcified pleural plaques again noted bilaterally consistent with  prior asbestos exposure. These are very similar in appearance to the prior CT in 2006 with potentially some interval increase calcifications in several of the plaques. No evidence to suggest malignancy. 2. Chronic lung disease with bronchiolectasis in both lower lungs, right greater than left. No evidence to suggest progressive asbestosis. 3. Coronary atherosclerosis. 4. Mildly dilated ascending thoracic aorta measuring 4.1-4.2 cm. Recommend annual imaging followup by CTA or MRA. This recommendation follows 2010 ACCF/AHA/AATS/ACR/ASA/SCA/SCAI/SIR/STS/SVM Guidelines for the Diagnosis and Management of Patients with Thoracic Aortic Disease. Circulation. 2010; 121: [id number]   Assessment / Plan:    1/ 4.0 cm ascending thoracic aortic aneurysm which is not significantly changed compared to prior exam. -Follow-up scan in 12 months both to evaluate his ascending aorta and the status of his extensive pleural plaques with known asbestos exposure. 2/ Status post coronary artery bypass graft.  3/ Stable bilateral calcified and noncalcified pleural plaques are noted consistent with asbestos exposure with no changes to suggest mesothelioma   Grace Isaac MD      [address] [place] [address] Office [phone]  [place] [phone]  06/19/2019 2:58 PM

## 2019-06-25 DIAGNOSIS — R7301 Impaired fasting glucose: Secondary | ICD-10-CM | POA: Diagnosis not present

## 2019-06-25 DIAGNOSIS — I119 Hypertensive heart disease without heart failure: Secondary | ICD-10-CM | POA: Diagnosis not present

## 2019-06-25 DIAGNOSIS — E782 Mixed hyperlipidemia: Secondary | ICD-10-CM | POA: Diagnosis not present

## 2019-06-30 DIAGNOSIS — I779 Disorder of arteries and arterioles, unspecified: Secondary | ICD-10-CM | POA: Diagnosis not present

## 2019-06-30 DIAGNOSIS — I119 Hypertensive heart disease without heart failure: Secondary | ICD-10-CM | POA: Diagnosis not present

## 2019-06-30 DIAGNOSIS — E1169 Type 2 diabetes mellitus with other specified complication: Secondary | ICD-10-CM | POA: Diagnosis not present

## 2019-06-30 DIAGNOSIS — Z0000 Encounter for general adult medical examination without abnormal findings: Secondary | ICD-10-CM | POA: Diagnosis not present

## 2019-06-30 DIAGNOSIS — I712 Thoracic aortic aneurysm, without rupture: Secondary | ICD-10-CM | POA: Diagnosis not present

## 2019-06-30 DIAGNOSIS — I700 Atherosclerosis of aorta: Secondary | ICD-10-CM | POA: Diagnosis not present

## 2019-06-30 DIAGNOSIS — N529 Male erectile dysfunction, unspecified: Secondary | ICD-10-CM | POA: Diagnosis not present

## 2019-06-30 DIAGNOSIS — R296 Repeated falls: Secondary | ICD-10-CM | POA: Diagnosis not present

## 2019-06-30 DIAGNOSIS — F411 Generalized anxiety disorder: Secondary | ICD-10-CM | POA: Diagnosis not present

## 2019-07-17 ENCOUNTER — Other Ambulatory Visit: Payer: Self-pay | Admitting: Cardiovascular Disease

## 2019-11-27 ENCOUNTER — Other Ambulatory Visit: Payer: Self-pay | Admitting: Cardiovascular Disease

## 2020-01-12 DIAGNOSIS — G4700 Insomnia, unspecified: Secondary | ICD-10-CM | POA: Diagnosis not present

## 2020-01-12 DIAGNOSIS — E782 Mixed hyperlipidemia: Secondary | ICD-10-CM | POA: Diagnosis not present

## 2020-01-12 DIAGNOSIS — E1169 Type 2 diabetes mellitus with other specified complication: Secondary | ICD-10-CM | POA: Diagnosis not present

## 2020-01-12 DIAGNOSIS — I119 Hypertensive heart disease without heart failure: Secondary | ICD-10-CM | POA: Diagnosis not present

## 2020-01-13 DIAGNOSIS — C44329 Squamous cell carcinoma of skin of other parts of face: Secondary | ICD-10-CM | POA: Diagnosis not present

## 2020-01-13 DIAGNOSIS — D485 Neoplasm of uncertain behavior of skin: Secondary | ICD-10-CM | POA: Diagnosis not present

## 2020-02-27 DIAGNOSIS — C44329 Squamous cell carcinoma of skin of other parts of face: Secondary | ICD-10-CM | POA: Diagnosis not present

## 2020-03-26 ENCOUNTER — Ambulatory Visit: Payer: Medicare HMO | Admitting: Cardiovascular Disease

## 2020-03-26 ENCOUNTER — Other Ambulatory Visit: Payer: Self-pay

## 2020-03-26 ENCOUNTER — Encounter: Payer: Self-pay | Admitting: Cardiovascular Disease

## 2020-03-26 VITALS — BP 134/72 | HR 85 | Ht 68.0 in | Wt 191.0 lb

## 2020-03-26 DIAGNOSIS — I700 Atherosclerosis of aorta: Secondary | ICD-10-CM | POA: Diagnosis not present

## 2020-03-26 DIAGNOSIS — I25118 Atherosclerotic heart disease of native coronary artery with other forms of angina pectoris: Secondary | ICD-10-CM | POA: Diagnosis not present

## 2020-03-26 DIAGNOSIS — E785 Hyperlipidemia, unspecified: Secondary | ICD-10-CM

## 2020-03-26 DIAGNOSIS — I712 Thoracic aortic aneurysm, without rupture: Secondary | ICD-10-CM | POA: Diagnosis not present

## 2020-03-26 DIAGNOSIS — I10 Essential (primary) hypertension: Secondary | ICD-10-CM | POA: Diagnosis not present

## 2020-03-26 DIAGNOSIS — I7121 Aneurysm of the ascending aorta, without rupture: Secondary | ICD-10-CM

## 2020-03-26 NOTE — Progress Notes (Signed)
Cardiology Office Note    Date:  03/29/2020   ID:  Trevor Ward, DOB 23-Jun-1941, MRN [id number]  PCP:  Mayra Neer, MD  Cardiologist:  Caralynn Gelber Electrophysiologist:  None   Evaluation Performed:  Follow-Up Visit  Chief Complaint:  CAD  History of Present Illness:    Trevor Ward is a 79 y.o. male with a history of CAD s/p CABG (02/02/16: CABG x4 with LIMA to LAD, SVG sequential to the first and second intermediate, SVG to PDA, Ceasar Mons, MD). 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  The patient specifically denies any chest pain at rest exertion, dyspnea at rest or with exertion, orthopnea, paroxysmal nocturnal dyspnea, syncope, palpitations, focal neurological deficits, intermittent claudication, lower extremity edema, unexplained weight gain, cough, hemoptysis or wheezing.  Still eating unhealthily: pork chops are preferred item.  Last September his CT angiogram of the aorta showed borderline enlargement of the ascending aorta at 3.9 cm. Once again were noted calcified pleural plaques and a stable subpleural right lower lobe lung nodule, possibly sequelae of previous work but has noticed this.  Glycemic control has been good with a hemoglobin A1c of 6.5% in April, but his LDL cholesterol was up at 125 (previously 59). Has always had a very low HDL (25)  Past Medical History:  Diagnosis Date   Arthritis    "fingers" (02/01/2016)   Depression    Diabetes mellitus without complication (H[facility])    GERD (gastroesophageal reflux disease)    High cholesterol    "can't take RX; made my legs weak" (02/01/2016)   History of gout    History of hiatal hernia     Hypertension    Peripheral vascular disease ([place])    carotid artery stenosis   Skin cancer of face     "the good kind"   Stroke [facility]) 2016   denies residual on 02/01/2016   Past Surgical History:  Procedure Laterality Date   CARDIAC CATHETERIZATION N/A 02/01/2016   Procedure: Left Heart Cath and Coronary Angiography;  Surgeon: Burnell Blanks, MD;  Location: [place] [facility];  Service: Cardiovascular;  Laterality: N/A;   COLONOSCOPY     CORONARY ARTERY BYPASS GRAFT N/A 02/02/2016   Procedure: CORONARY ARTERY BYPASS GRAFTING (CABG) times 4 using left internal mammary and left saphenous ven harvested by endoven ;  Surgeon: Grace Isaac, MD;  Location: [place];  Service: Open Heart Surgery;  Laterality: N/A;   ENDARTERECTOMY Right 04/21/2015   Procedure: RIGHT CAROTID ENDARTERECTOMY;  Surgeon: Elam Dutch, MD;  Location: [place];  Service: Vascular;  Laterality: Right;   ESOPHAGOGASTRODUODENOSCOPY (EGD) WITH PROPOFOL N/A 01/03/2016   Procedure: ESOPHAGOGASTRODUODENOSCOPY (EGD) WITH PROPOFOL;  Surgeon: Garlan Fair, MD;  Location: [facility];  Service: Endoscopy;  Laterality: N/A;   KNEE ARTHROSCOPY Right    open heart surgery  01/2016   PERIPHERAL VASCULAR CATHETERIZATION Bilateral 04/14/2015   Procedure: Carotid Angiography;  Surgeon: Serafina Mitchell, MD;  Location: [place] [facility];  Service: Cardiovascular;  Laterality: Bilateral;   SKIN CANCER EXCISION Right    "temple"   TEE WITHOUT CARDIOVERSION N/A 02/02/2016   Procedure: TRANSESOPHAGEAL ECHOCARDIOGRAM (TEE);  Surgeon: Grace Isaac, MD;  Location: [place];  Service: Open Heart Surgery;  Laterality: N/A;  No outpatient medications have been marked as taking for the 03/26/20 encounter (Office Visit) with Christl Fessenden, Dani Gobble, MD.     Allergies:   Statins   Social History   Tobacco Use   Smoking status: Former Smoker    Years: 1.50    Types: Cigarettes    Start date: 01/28/1956   Smokeless tobacco:  Never Used   Tobacco comment: "quit smoking cigarettes when I was 79 yr old"  Vaping Use   Vaping Use: Never used  Substance Use Topics   Alcohol use: No    Alcohol/week: 0.0 standard drinks   Drug use: No     Family Hx: [name] family history includes Arthritis in his mother; Breast cancer in his mother; CAD in his brother, brother, and mother; CVA in his brother, mother, and sister; Diabetes in his brother, brother, and mother; Heart attack in his father; Parkinson's disease in his mother.  ROS:   Please see the history of present illness.    All other systems are reviewed and are negative.  Prior CV studies:   The following studies were reviewed today:  Labs 01/12/2020 Chol 181, HDL 25, LDL 125, TG 175 Hgb A1c 6.5% Creat 1.2, K 4.2, normal LFTs and TSH  Labs/Other Tests and Data Reviewed:    EKG:  Ordered today, shows NSR, left axis deviation, no ischemic changes, QTc 428 ms Recent Labs: BMET    Component Value Date/Time   NA 140 05/16/2016 0945   K 4.7 05/16/2016 0945   CL 102 05/16/2016 0945   CO2 27 05/16/2016 0945   GLUCOSE 113 (H) 05/16/2016 0945   BUN 8 05/16/2016 0945   CREATININE 0.85 05/16/2016 0945   CALCIUM 9.1 05/16/2016 0945   GFRNONAA >60 02/08/2016 0405   GFRAA >60 02/08/2016 0405     Recent Lipid Panel Lab Results  Component Value Date/Time   CHOL 115 03/16/2017 08:55 AM   TRIG 168 (H) 03/16/2017 08:55 AM   HDL 23 (L) 03/16/2017 08:55 AM   CHOLHDL 5.0 03/16/2017 08:55 AM   CHOLHDL 6.6 (H) 09/27/2016 10:56 AM   LDLCALC 58 03/16/2017 08:55 AM   September 2019  total cholesterol 110, HDL 22, LDL 59,   triglycerides 142 April 2021    total cholesterol 181, HDL 25, LDL 125, triglycerides  175   Wt Readings from Last 3 Encounters:  03/26/20 191 lb (86.6 kg)  06/19/19 198 lb (89.8 kg)  02/24/19 202 lb (91.6 kg)     Objective:    Vital Signs:  BP 134/72    Pulse 85    Ht 5\' 8"  (1.727 m)    Wt 191 lb (86.6 kg)    SpO2 98%    BMI  29.04 kg/m    General: Alert, oriented x3, no distress Head: no evidence of trauma, PERRL, EOMI, no exophtalmos or lid lag, no myxedema, no xanthelasma; normal ears, nose and oropharynx Neck: normal jugular venous pulsations and no hepatojugular reflux; brisk carotid pulses without delay and no carotid bruits Chest: clear to auscultation, no signs of consolidation by percussion or palpation, normal fremitus, symmetrical and full respiratory excursions Cardiovascular: normal position and quality of the apical impulse, regular rhythm, normal first and second heart sounds, no murmurs, rubs or gallops Abdomen: no tenderness or distention, no masses by palpation, no abnormal pulsatility or arterial bruits, normal bowel sounds, no hepatosplenomegaly Extremities: no clubbing, cyanosis or edema; 2+ radial, ulnar and brachial pulses bilaterally; 2+ right femoral, posterior tibial and dorsalis pedis pulses; 2+  left femoral, posterior tibial and dorsalis pedis pulses; no subclavian or femoral bruits Neurological: grossly nonfocal Psych: Normal mood and affect   ASSESSMENT & PLAN:    1. Coronary artery disease of native artery of native heart with stable angina pectoris ([place])   2. Ascending aortic aneurysm ([place])   3. Aortic atherosclerosis ([place])   4. Dyslipidemia   5. Essential hypertension      1. CAD s/p CABG: Asymptomatic on ASA, beta blocker, statin. Stop nitrates. 2. Asc Ao Aneurysm: 38 mm on last scan in Sept 2020.  Aortic atherosclerosis is present. Has seen Dr. Servando Snare. 3. HLP: previously had an excellent LDL cholesterol, very low HDL.  Suspect statin noncompliance.  Encouraged compliance with statin, less meat in diet, increased physical activity. Has a 38 inch waist , try to get to under 36". 4. HTN/Orthostatic hypotension: controlled; stop isosorbide.  COVID-19 Education: The signs and symptoms of COVID-19 were discussed with the patient and how to seek care for testing (follow up with  PCP or arrange E-visit).  The importance of social distancing was discussed today.  Time:   Today, I have spent 19 minutes with the patient with telehealth technology discussing the above problems.     Medication Adjustments/Labs and Tests Ordered: Current medicines are reviewed at length with the patient today.  Concerns regarding medicines are outlined above.   Tests Ordered: Orders Placed This Encounter  Procedures   EKG 12-Lead    Medication Changes: No orders of the defined types were placed in this encounter.  Patient Instructions  Medication Instructions:  STOP the Isosorbide (Imdur)  *If you need a refill on your cardiac medications before your next appointment, please call your pharmacy*   Lab Work: None ordered If you have labs (blood work) drawn today and your tests are completely normal, you will receive your results only by:  [place] (if you have MyChart) OR  A paper copy in the mail If you have any lab test that is abnormal or we need to change your treatment, we will call you to review the results.   Testing/Procedures: None ordered   Follow-Up: At [facility], you and your health needs are our priority.  As part of our continuing mission to provide you with exceptional heart care, we have created designated Provider Care Teams.  These Care Teams include your primary Cardiologist (physician) and Advanced Practice Providers (APPs -  Physician Assistants and Nurse Practitioners) who all work together to provide you with the care you need, when you need it.  We recommend signing up for the patient portal called "MyChart".  Sign up information is provided on this After Visit Summary.  MyChart is used to connect with patients for Virtual Visits (Telemedicine).  Patients are able to view lab/test results, encounter notes, upcoming appointments, etc.  Non-urgent messages can be sent to your provider as well.   To learn more about what you can do with  MyChart, go to [email or web address].    Your next appointment:   12 month(s)  The format for your next appointment:   In Person  Provider:   You may see Sanda Klein, MD or one of the following Advanced Practice Providers on your designated Care Team:    Almyra Deforest, PA-C  Fabian Sharp, PA-C or   Roby Lofts, [place]    Disposition:  Follow up 12 months  Signed, Sanda Klein, MD  03/29/2020 12:02 PM    [place]

## 2020-03-26 NOTE — Patient Instructions (Signed)
Medication Instructions:  STOP the Isosorbide (Imdur)  *If you need a refill on your cardiac medications before your next appointment, please call your pharmacy*   Lab Work: None ordered If you have labs (blood work) drawn today and your tests are completely normal, you will receive your results only by: [place] Kitchen MyChart Message (if you have MyChart) OR . A paper copy in the mail If you have any lab test that is abnormal or we need to change your treatment, we will call you to review the results.   Testing/Procedures: None ordered   Follow-Up: At [facility], you and your health needs are our priority.  As part of our continuing mission to provide you with exceptional heart care, we have created designated Provider Care Teams.  These Care Teams include your primary Cardiologist (physician) and Advanced Practice Providers (APPs -  Physician Assistants and Nurse Practitioners) who all work together to provide you with the care you need, when you need it.  We recommend signing up for the patient portal called "MyChart".  Sign up information is provided on this After Visit Summary.  MyChart is used to connect with patients for Virtual Visits (Telemedicine).  Patients are able to view lab/test results, encounter notes, upcoming appointments, etc.  Non-urgent messages can be sent to your provider as well.   To learn more about what you can do with MyChart, go to [email or web address].    Your next appointment:   12 month(s)  The format for your next appointment:   In Person  Provider:   You may see Sanda Klein, MD or one of the following Advanced Practice Providers on your designated Care Team:    Almyra Deforest, PA-C  Fabian Sharp, PA-C or   Roby Lofts, [place]

## 2020-04-12 ENCOUNTER — Other Ambulatory Visit: Payer: Self-pay | Admitting: Cardiovascular Disease

## 2020-05-18 ENCOUNTER — Other Ambulatory Visit: Payer: Self-pay | Admitting: Cardiothoracic Surgery

## 2020-05-18 DIAGNOSIS — I712 Thoracic aortic aneurysm, without rupture, unspecified: Secondary | ICD-10-CM

## 2020-06-24 ENCOUNTER — Encounter: Payer: Self-pay | Admitting: Cardiothoracic Surgery

## 2020-06-24 ENCOUNTER — Ambulatory Visit (INDEPENDENT_AMBULATORY_CARE_PROVIDER_SITE_OTHER): Payer: Medicare HMO | Admitting: Cardiothoracic Surgery

## 2020-06-24 ENCOUNTER — Other Ambulatory Visit: Payer: Self-pay

## 2020-06-24 ENCOUNTER — Ambulatory Visit
Admission: RE | Admit: 2020-06-24 | Discharge: 2020-06-24 | Disposition: A | Discharge status: Home / Self Care | Payer: Medicare HMO | Source: Ambulatory Visit | Attending: Cardiothoracic Surgery | Admitting: Cardiothoracic Surgery

## 2020-06-24 VITALS — BP 119/76 | HR 91 | Temp 97.9°F | Resp 20 | Ht 68.0 in | Wt 193.0 lb

## 2020-06-24 DIAGNOSIS — I712 Thoracic aortic aneurysm, without rupture, unspecified: Secondary | ICD-10-CM

## 2020-06-24 DIAGNOSIS — J929 Pleural plaque without asbestos: Secondary | ICD-10-CM | POA: Diagnosis not present

## 2020-06-24 DIAGNOSIS — K449 Diaphragmatic hernia without obstruction or gangrene: Secondary | ICD-10-CM | POA: Diagnosis not present

## 2020-06-24 DIAGNOSIS — I2510 Atherosclerotic heart disease of native coronary artery without angina pectoris: Secondary | ICD-10-CM | POA: Diagnosis not present

## 2020-06-24 DIAGNOSIS — I700 Atherosclerosis of aorta: Secondary | ICD-10-CM | POA: Diagnosis not present

## 2020-06-24 NOTE — Progress Notes (Signed)
[address]       [place] [address]             [phone]      Trevor Ward  Medical Record #[id number] Date of Birth: Jan 18, 1941  Referring: Burnell Blanks* Primary Care: Mayra Neer, MD Primary Cardiologist:  Chief Complaint:   POST OP FOLLOW UP  History of Present Illness:     02/02/2016 OPERATIVE REPORT PREOPERATIVE DIAGNOSIS: Coronary occlusive disease with unstable angina. POSTOPERATIVE DIAGNOSIS: Coronary occlusive disease with unstable angina. SURGICAL PROCEDURE: Coronary artery bypass grafting x4 with left internal mammary to left anterior descending coronary artery, sequential reverse saphenous vein graft to the first and second intermediate reverse saphenous vein graft to the posterior descending with the left greater saphenous vein endoscopic harvesting left thigh. SURGEON: Lanelle Bal, MD.  Patient comes to the office today for follow-up CTA of the chest for mildly dilated ascending aorta and pleural calcifications . In May 2017 the patient had coronary artery bypass grafting. 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   Since last seen he said no recurrent angina or evidence of congestive heart failure.       Past Medical History:  Diagnosis Date  . Arthritis    "fingers" (02/01/2016)  . Depression   . Diabetes mellitus without complication ([place])   . GERD (gastroesophageal reflux disease)   . High cholesterol    "can't take RX; made my legs weak" (02/01/2016)  . History of gout   . History of hiatal hernia   . Hypertension   . Peripheral vascular disease ([place])    carotid artery stenosis  . Skin cancer of face     "the good kind"  . Stroke [facility]) 2016   denies residual on 02/01/2016     Social History   Tobacco Use  Smoking Status Former Smoker  . Years: 1.50  . Types: Cigarettes  .  Start date: 01/28/1956  Smokeless Tobacco Never Used  Tobacco Comment   "quit smoking cigarettes when I was 79 yr old"    Social History   Substance and Sexual Activity  Alcohol Use No  . Alcohol/week: 0.0 standard drinks     Allergies  Allergen Reactions  . Statins Other (See Comments)    Severe myalgias    Current Outpatient Medications  Medication Sig Dispense Refill  . allopurinol (ZYLOPRIM) 100 MG tablet Take 100 mg by mouth daily.     Marland Kitchen amitriptyline (ELAVIL) 25 MG tablet TAKE 1 TABLET BY MOUTH EVERYDAY AT BEDTIME    . aspirin EC 81 MG EC tablet Take 1 tablet (81 mg total) by mouth daily.    Marland Kitchen buPROPion (WELLBUTRIN XL) 300 MG 24 hr tablet     . colchicine 0.6 MG tablet     . Glucosamine Sulfate (GLUCOSAMINE RELIEF) 1000 MG TABS Take 2,000 mg by mouth daily.    . meclizine (ANTIVERT) 25 MG tablet TAKE 1 TABLET BY MOUTH 4 TIMES A DAY AS NEEDED FOR DIZZINESS  0  . metoprolol tartrate (LOPRESSOR) 25 MG tablet TAKE 1 TABLET TWO TIMES DAILY. MAKE OFFICE VISIT FOR MORE REFILLS. 180 tablet 3  . pantoprazole (PROTONIX) 40 MG tablet Take 1 tablet (40 mg total) by mouth daily. 90 tablet 3  . rosuvastatin (CRESTOR) 5 MG tablet Take 1 tablet (5 mg total) by mouth daily. 30 tablet 3  .  vitamin B-12 (CYANOCOBALAMIN) 500 MCG tablet Take 500 mcg by mouth daily. (Patient not taking: Reported on 06/24/2020)     No current facility-administered medications for this visit.       Physical Exam: BP 119/76   Pulse 91   Temp 97.9 F (36.6 C)   Resp 20   Ht 5\' 8"  (1.727 m)   Wt 193 lb (87.5 kg)   SpO2 91%   BMI 29.35 kg/m  General appearance: alert and appears stated age Head: Normocephalic, without obvious abnormality, atraumatic Neck: no adenopathy, no carotid bruit, no JVD, supple, symmetrical, trachea midline and thyroid not enlarged, symmetric, no tenderness/mass/nodules Lymph nodes: Cervical, supraclavicular, and axillary nodes normal. Resp: clear to auscultation bilaterally  Cardio: regular rate and rhythm, S1, S2 normal, no murmur, click, rub or gallop GI: soft, non-tender; bowel sounds normal; no masses,  no organomegaly Extremities: extremities normal, atraumatic, no cyanosis or edema Neurologic: Grossly normal    Diagnostic Studies & Laboratory data:     Recent Radiology Findings:  CT Chest Wo Contrast  Result Date: 06/24/2020 CLINICAL DATA:  History of asbestos exposure. EXAM: CT CHEST WITHOUT CONTRAST TECHNIQUE: Multidetector CT imaging of the chest was performed following the standard protocol without IV contrast. COMPARISON:  06/19/2019 FINDINGS: Cardiovascular: The heart size is normal. No substantial pericardial effusion. Coronary artery calcification is evident. Status post CABG. Atherosclerotic calcification is noted in the wall of the thoracic aorta. Ascending thoracic aorta measures 4.0 cm maximum diameter when remeasured at the same level and in a similar fashion to the 06/19/2019 exam (3.8 cm on that study). Mediastinum/Nodes: No mediastinal lymphadenopathy. No evidence for gross hilar lymphadenopathy although assessment is limited by the lack of intravenous contrast on today's study. Tiny hiatal hernia. The esophagus has normal imaging features. There is no axillary lymphadenopathy. Lungs/Pleura: Bilateral calcified pleural plaques again noted. Scattered areas of mild subpleural reticulation noted bilaterally, similar to prior. Index nodule subpleural right lower lobe (115/8) is 5 mm today, stable in the interval. No new suspicious pulmonary nodule or mass. No focal airspace consolidation. No pleural effusion. Upper Abdomen: Unremarkable. Musculoskeletal: No worrisome lytic or sclerotic osseous abnormality. IMPRESSION: 1. Ascending thoracic aorta now measures 4 point cm maximum diameter compared to 3.8 cm previously. 2. Bilateral calcified pleural plaques consistent with prior asbestos exposure. 3. Stable 5 mm subpleural right lower lobe pulmonary nodule  consistent with benign etiology. 4. Aortic Atherosclerosis (ICD10-I70.0). Electronically Signed   By: Misty Stanley M.D.   On: 06/24/2020 14:45      I have independently reviewed the above radiology studies  and reviewed the findings with the patient.   Study Result   CLINICAL DATA:  Thoracic aortic aneurysm without rupture.  EXAM: CT ANGIOGRAPHY CHEST WITH CONTRAST  TECHNIQUE: Multidetector CT imaging of the chest was performed using the standard protocol during bolus administration of intravenous contrast. Multiplanar CT image reconstructions and MIPs were obtained to evaluate the vascular anatomy.  CONTRAST:  75 mL of Isovue 370 intravenously.  COMPARISON:  CT scan of June 26, 2016.  FINDINGS: Cardiovascular: Atherosclerosis of thoracic aorta is noted without dissection. Status post coronary artery bypass graft. Coronary artery calcifications are noted. On the current exam, the thoracic aorta has a maximum measured diameter 4.0 cm. This is not significantly changed compared to prior exam. No pericardial effusion is noted.  Mediastinum/Nodes: No enlarged mediastinal, hilar, or axillary lymph nodes. Thyroid gland, trachea, and esophagus demonstrate no significant findings.  Lungs/Pleura: No pneumothorax or pleural effusion is noted. Stable  calcified and noncalcified pleural plaques are noted bilaterally. No acute pulmonary disease is noted.  Upper Abdomen: No acute abnormality.  Musculoskeletal: No chest wall abnormality. No acute or significant osseous findings.  Review of the MIP images confirms the above findings.  IMPRESSION: 4.0 cm ascending thoracic aortic aneurysm which is not significantly changed compared to prior exam. Recommend annual imaging followup by CTA or MRA. This recommendation follows 2010 ACCF/AHA/AATS/ACR/ASA/SCA/SCAI/SIR/STS/SVM Guidelines for the Diagnosis and Management of Patients with Thoracic Aortic Disease. Circulation.  2010; 121: P794-I016.  Status post coronary artery bypass graft.  Stable bilateral calcified and noncalcified pleural plaques are noted consistent with asbestos exposure.  Aortic Atherosclerosis (ICD10-I70.0).   Electronically Signed   By: Marijo Conception, M.D.   On: 06/04/2017 09:47     Recent Lab Findings: Lab Results  Component Value Date   WBC 7.6 02/08/2016   HGB 11.2 (L) 02/08/2016   HCT 34.2 (L) 02/08/2016   PLT 275 02/08/2016   GLUCOSE 113 (H) 05/16/2016   CHOL 115 03/16/2017   TRIG 168 (H) 03/16/2017   HDL 23 (L) 03/16/2017   LDLCALC 58 03/16/2017   ALT 10 05/16/2016   AST 14 05/16/2016   NA 140 05/16/2016   K 4.7 05/16/2016   CL 102 05/16/2016   CREATININE 0.85 05/16/2016   BUN 8 05/16/2016   CO2 27 05/16/2016   TSH 2.55 01/31/2016   INR 1.41 02/02/2016   HGBA1C 6.4 (H) 02/02/2016   Ct: FROM MAY 2017 IMPRESSION: 1. Multiple calcified pleural plaques again noted bilaterally consistent with prior asbestos exposure. These are very similar in appearance to the prior CT in 2006 with potentially some interval increase calcifications in several of the plaques. No evidence to suggest malignancy. 2. Chronic lung disease with bronchiolectasis in both lower lungs, right greater than left. No evidence to suggest progressive asbestosis. 3. Coronary atherosclerosis. 4. Mildly dilated ascending thoracic aorta measuring 4.1-4.2 cm. Recommend annual imaging followup by CTA or MRA. This recommendation follows 2010 ACCF/AHA/AATS/ACR/ASA/SCA/SCAI/SIR/STS/SVM Guidelines for the Diagnosis and Management of Patients with Thoracic Aortic Disease. Circulation. 2010; 121: P537-S827   Assessment / Plan:    1/mildly dilated ascending aorta approximately 4.1 cm- -no indication for surgical intervention at this time 2/ Status post coronary artery bypass graft.  3/ Stable bilateral calcified and noncalcified pleural plaques are noted consistent with asbestos exposure  with no changes to suggest mesothelioma  Without significant change to the patient's pleural calcifications or his ascending aortic size-I have not made him appointment to be seen in the cardiac surgery office we did discuss that he would discuss with his primary care doctor a follow-up CTA of the chest in 2 years.  Grace Isaac MD      [address] [place] [address] Office [phone]   [place]  06/27/2020 6:26 PM

## 2020-07-06 DIAGNOSIS — K449 Diaphragmatic hernia without obstruction or gangrene: Secondary | ICD-10-CM | POA: Diagnosis not present

## 2020-07-06 DIAGNOSIS — N529 Male erectile dysfunction, unspecified: Secondary | ICD-10-CM | POA: Diagnosis not present

## 2020-07-06 DIAGNOSIS — E782 Mixed hyperlipidemia: Secondary | ICD-10-CM | POA: Diagnosis not present

## 2020-07-06 DIAGNOSIS — Z0000 Encounter for general adult medical examination without abnormal findings: Secondary | ICD-10-CM | POA: Diagnosis not present

## 2020-07-06 DIAGNOSIS — I712 Thoracic aortic aneurysm, without rupture: Secondary | ICD-10-CM | POA: Diagnosis not present

## 2020-07-06 DIAGNOSIS — I700 Atherosclerosis of aorta: Secondary | ICD-10-CM | POA: Diagnosis not present

## 2020-07-06 DIAGNOSIS — G4700 Insomnia, unspecified: Secondary | ICD-10-CM | POA: Diagnosis not present

## 2020-07-06 DIAGNOSIS — I779 Disorder of arteries and arterioles, unspecified: Secondary | ICD-10-CM | POA: Diagnosis not present

## 2020-07-06 DIAGNOSIS — F411 Generalized anxiety disorder: Secondary | ICD-10-CM | POA: Diagnosis not present

## 2020-07-06 DIAGNOSIS — Z23 Encounter for immunization: Secondary | ICD-10-CM | POA: Diagnosis not present

## 2020-07-06 DIAGNOSIS — I119 Hypertensive heart disease without heart failure: Secondary | ICD-10-CM | POA: Diagnosis not present

## 2020-07-06 DIAGNOSIS — E1169 Type 2 diabetes mellitus with other specified complication: Secondary | ICD-10-CM | POA: Diagnosis not present

## 2020-12-27 ENCOUNTER — Other Ambulatory Visit: Payer: Self-pay | Admitting: Cardiovascular Disease

## 2021-01-17 DIAGNOSIS — E1169 Type 2 diabetes mellitus with other specified complication: Secondary | ICD-10-CM | POA: Diagnosis not present

## 2021-01-17 DIAGNOSIS — I700 Atherosclerosis of aorta: Secondary | ICD-10-CM | POA: Diagnosis not present

## 2021-01-17 DIAGNOSIS — I119 Hypertensive heart disease without heart failure: Secondary | ICD-10-CM | POA: Diagnosis not present

## 2021-01-17 DIAGNOSIS — E782 Mixed hyperlipidemia: Secondary | ICD-10-CM | POA: Diagnosis not present

## 2021-01-17 DIAGNOSIS — J449 Chronic obstructive pulmonary disease, unspecified: Secondary | ICD-10-CM | POA: Diagnosis not present

## 2021-02-02 ENCOUNTER — Telehealth: Payer: Self-pay | Admitting: *Deleted

## 2021-02-02 NOTE — Telephone Encounter (Signed)
Called and spoke with the patient and family member. Recent labs received from Dr. Brigitte Pulse showed an elevated lipid panel (labs scanned). Dr. Sallyanne Kuster would like for the patient to increase his Rosuvastatin to 10 mg once daily. The patient and family stated that Dr. Brigitte Pulse had already done this. Epic updated.

## 2021-02-08 ENCOUNTER — Other Ambulatory Visit: Payer: Self-pay

## 2021-02-08 ENCOUNTER — Emergency Department (HOSPITAL_COMMUNITY)
Admission: EM | Admit: 2021-02-08 | Discharge: 2021-02-09 | Disposition: A | Discharge status: Home / Self Care | Payer: Medicare HMO | Attending: Emergency Medicine | Admitting: Emergency Medicine

## 2021-02-08 ENCOUNTER — Ambulatory Visit
Admission: EM | Admit: 2021-02-08 | Discharge: 2021-02-08 | Disposition: A | Discharge status: Home / Self Care | Payer: Medicare HMO | Source: Home / Self Care

## 2021-02-08 ENCOUNTER — Encounter (HOSPITAL_COMMUNITY): Payer: Self-pay | Admitting: Emergency Medicine

## 2021-02-08 ENCOUNTER — Emergency Department (HOSPITAL_COMMUNITY): Payer: Medicare HMO

## 2021-02-08 ENCOUNTER — Encounter: Payer: Self-pay | Admitting: Emergency Medicine

## 2021-02-08 DIAGNOSIS — I712 Thoracic aortic aneurysm, without rupture, unspecified: Secondary | ICD-10-CM

## 2021-02-08 DIAGNOSIS — I700 Atherosclerosis of aorta: Secondary | ICD-10-CM | POA: Diagnosis not present

## 2021-02-08 DIAGNOSIS — Z5321 Procedure and treatment not carried out due to patient leaving prior to being seen by health care provider: Secondary | ICD-10-CM | POA: Diagnosis not present

## 2021-02-08 DIAGNOSIS — M549 Dorsalgia, unspecified: Secondary | ICD-10-CM | POA: Diagnosis not present

## 2021-02-08 DIAGNOSIS — M79601 Pain in right arm: Secondary | ICD-10-CM | POA: Insufficient documentation

## 2021-02-08 DIAGNOSIS — M79602 Pain in left arm: Secondary | ICD-10-CM | POA: Insufficient documentation

## 2021-02-08 DIAGNOSIS — M546 Pain in thoracic spine: Secondary | ICD-10-CM | POA: Insufficient documentation

## 2021-02-08 DIAGNOSIS — J929 Pleural plaque without asbestos: Secondary | ICD-10-CM | POA: Diagnosis not present

## 2021-02-08 DIAGNOSIS — Z951 Presence of aortocoronary bypass graft: Secondary | ICD-10-CM | POA: Insufficient documentation

## 2021-02-08 DIAGNOSIS — R52 Pain, unspecified: Secondary | ICD-10-CM

## 2021-02-08 LAB — COMPREHENSIVE METABOLIC PANEL
ALT: 18 U/L (ref 0–44)
AST: 22 U/L (ref 15–41)
Albumin: 3.9 g/dL (ref 3.5–5.0)
Alkaline Phosphatase: 62 U/L (ref 38–126)
Anion gap: 7 (ref 5–15)
BUN: 11 mg/dL (ref 8–23)
CO2: 26 mmol/L (ref 22–32)
Calcium: 8.8 mg/dL — ABNORMAL LOW (ref 8.9–10.3)
Chloride: 99 mmol/L (ref 98–111)
Creatinine, Ser: 1.14 mg/dL (ref 0.61–1.24)
GFR, Estimated: >60 mL/min (ref >60)
Glucose, Bld: 122 mg/dL — ABNORMAL HIGH (ref 70–99)
Potassium: 4.1 mmol/L (ref 3.5–5.1)
Sodium: 132 mmol/L — ABNORMAL LOW (ref 135–145)
Total Bilirubin: 0.5 mg/dL (ref 0.3–1.2)
Total Protein: 7 g/dL (ref 6.5–8.1)

## 2021-02-08 LAB — CBC
HCT: 50.3 % (ref 39.0–52.0)
Hemoglobin: 16.7 g/dL (ref 13.0–17.0)
MCH: 29.3 pg (ref 26.0–34.0)
MCHC: 33.2 g/dL (ref 30.0–36.0)
MCV: 88.2 fL (ref 80.0–100.0)
Platelets: 193 10*3/uL (ref 150–400)
RBC: 5.7 MIL/uL (ref 4.22–5.81)
RDW: 13.6 % (ref 11.5–15.5)
WBC: 5.9 10*3/uL (ref 4.0–10.5)
nRBC: 0 % (ref 0.0–0.2)

## 2021-02-08 LAB — TROPONIN I (HIGH SENSITIVITY): Troponin I (High Sensitivity): 9 ng/L (ref <18)

## 2021-02-08 NOTE — ED Triage Notes (Signed)
Patient presents to [facility] for evaluation of left side pain since yesterday, and bilateral axillary pain x 1-2 hours.  Patient has poor hearing and this RN is having difficulty getting a complete history.  PAtient has had multiple falls in the past few months.  Also c/o pain between his scapula.

## 2021-02-08 NOTE — Discharge Instructions (Addendum)
-  Head straight to [facility] emergency room for further evaluation and management of back pain, left side pain.  With your history of the thoracic aortic aneurysm, I am concerned that you are having some referred pain from this.  It is also possible that you are having an early cardiac event.  For further evaluation and cardiac work-up, you must head straight to the ED.  If you develop worsening of symptoms on the way, like new/worsening pain, dizziness, shortness of breath, weakness-stop and call 911 immediately.

## 2021-02-08 NOTE — ED Provider Notes (Signed)
  Emergency Medicine Provider in Triage Note   MSE was initiated and I personally evaluated the patient  10:33 PM on Feb 08, 2021 as provider in triage.   Chief Complaint: back pain  HPI  Patient is a 80 y.o. who presents to the ED with complaints of pain between his shoulder blades, to his left side, and to the bilateral upper arms. No alleviating/aggravating factors.   Sent from UC due to concern for prior thoracic aortic aneurysm  Review of Systems  Positive: Back pain, mylagias Negative: Chest pain, dyspnea, hemoptysis, numbness, weakness  Physical Exam  BP 133/82 (BP Location: Right Arm)   Pulse (!) 101   Temp 98 F (36.7 C) (Oral)   Ht 5\' 8"  (1.727 m)   Wt 87.5 kg   SpO2 97%   BMI 29.35 kg/m    Gen:   Awake, no distress   HEENT:  Atraumatic  Resp:  Normal effort  Cardiac:  Mild tachycardia- 2+ symmetric radial pulses.  Abd:   Nondistended, nontender  MSK:   Moves extremities without difficulty  Neuro:  Speech clear   Medical Decision Making   Initiation of care has begun. The patient has been counseled on the process, plan, and necessity for staying for the completion/evaluation, informed that the remainder of the evaluation will be completed by another provider, this initial triage assessment does not replace that evaluation, and the importance of remaining in the ED until their evaluation is complete.  Clinical Impression  Back pain        Amaryllis Dyke, PA-C 02/08/21 2236    Lorelle Gibbs, DO 02/08/21 2347

## 2021-02-08 NOTE — ED Triage Notes (Signed)
Pt sent for urgent care to evaluate back and left side pain, hx of thoracic aortic aneurysm. Pt reports back pain that radiates to both arms and left side that started yesterday. Denies urinary sx, denies n/v/d. Pt is hard of hearing.

## 2021-02-08 NOTE — ED Provider Notes (Signed)
[place]    CSN: [id number] Arrival date & time: 02/08/21  1754      History   Chief Complaint Chief Complaint  Patient presents with  . Pain    HPI Trevor Ward is a 80 y.o. male presenting with pain of multiple sites. Here today with daughter. Medical history hypertension, CABG 2017, thoracic aortic aneurysm, CAD, hyperlipidemia, diabetes, GERD, arthritis, hiatal hernia, PVD. Endorses pain of multiple sites over last day. The worst pain was between his shoulderblades midline. This lasted most of the day but seems to have resolved in the last hour. Describes this as sharp, not exacerbated by position, movement, eating, etc. Also some pain under bilaterally axilla, L>R. Also without positional component. Denies current dizziness but did have some lightheadedness 1 day ago that lasted about 1 hour. Denies current shortness of breath, chest pain, cough, dizziness, weakness in arms or legs. Daughter notes pt has a high fall risk, but last fall was >1 month ago. Aspirin for anticoagulation.  HPI  Past Medical History:  Diagnosis Date  . Arthritis    "fingers" (02/01/2016)  . Depression   . Diabetes mellitus without complication ([place])   . GERD (gastroesophageal reflux disease)   . High cholesterol    "can't take RX; made my legs weak" (02/01/2016)  . History of gout   . History of hiatal hernia   . Hypertension   . Peripheral vascular disease ([place])    carotid artery stenosis  . Skin cancer of face     "the good kind"  . Stroke [facility]) 2016   denies residual on 02/01/2016    Patient Active Problem List   Diagnosis Date Noted  . Thoracic aortic aneurysm without rupture ([place]) 06/19/2019  . Coronary artery disease of native artery of native heart with stable angina pectoris ([place]) 04/14/2016  . Near syncope 02/28/2016  . HOH (hard of hearing) 02/08/2016  . S/P CABG x 4 02/02/16 02/02/2016  . Unstable angina ([place])   . Exertional dyspnea 01/28/2016  . Aortic  atherosclerosis ([place]) 01/28/2016  . Essential hypertension 07/26/2015  . History of Rt CA endarterectomy 07/26/2015  . Dyslipidemia   . Gout 04/11/2015  . History of ischemic right MCA stroke 04/11/2015    Past Surgical History:  Procedure Laterality Date  . CARDIAC CATHETERIZATION N/A 02/01/2016   Procedure: Left Heart Cath and Coronary Angiography;  Surgeon: Burnell Blanks, MD;  Location: [place] [facility];  Service: Cardiovascular;  Laterality: N/A;  . COLONOSCOPY    . CORONARY ARTERY BYPASS GRAFT N/A 02/02/2016   Procedure: CORONARY ARTERY BYPASS GRAFTING (CABG) times 4 using left internal mammary and left saphenous ven harvested by endoven ;  Surgeon: Grace Isaac, MD;  Location: [place];  Service: Open Heart Surgery;  Laterality: N/A;  . ENDARTERECTOMY Right 04/21/2015   Procedure: RIGHT CAROTID ENDARTERECTOMY;  Surgeon: Elam Dutch, MD;  Location: [place];  Service: Vascular;  Laterality: Right;  . ESOPHAGOGASTRODUODENOSCOPY (EGD) WITH PROPOFOL N/A 01/03/2016   Procedure: ESOPHAGOGASTRODUODENOSCOPY (EGD) WITH PROPOFOL;  Surgeon: Garlan Fair, MD;  Location: [facility];  Service: Endoscopy;  Laterality: N/A;  . KNEE ARTHROSCOPY Right   . open heart surgery  01/2016  . PERIPHERAL VASCULAR CATHETERIZATION Bilateral 04/14/2015   Procedure: Carotid Angiography;  Surgeon: Serafina Mitchell, MD;  Location: [facility];  Service: Cardiovascular;  Laterality: Bilateral;  . SKIN CANCER EXCISION Right    "temple"  . TEE WITHOUT CARDIOVERSION N/A 02/02/2016   Procedure:  TRANSESOPHAGEAL ECHOCARDIOGRAM (TEE);  Surgeon: Grace Isaac, MD;  Location: [facility];  Service: Open Heart Surgery;  Laterality: N/A;       Home Medications    Prior to Admission medications   Medication Sig Start Date End Date Taking? Authorizing Provider  allopurinol (ZYLOPRIM) 100 MG tablet Take 100 mg by mouth daily.  01/13/16   [provider]  amitriptyline (ELAVIL) 25 MG tablet TAKE  1 TABLET BY MOUTH EVERYDAY AT BEDTIME 04/19/19   [provider]  aspirin EC 81 MG EC tablet Take 1 tablet (81 mg total) by mouth daily. 02/10/16   Barrett, Lodema Hong, PA-C  buPROPion (WELLBUTRIN XL) 300 MG 24 hr tablet  05/19/19   [provider]  colchicine 0.6 MG tablet  03/27/16   [provider]  Glucosamine Sulfate (GLUCOSAMINE RELIEF) 1000 MG TABS Take 2,000 mg by mouth daily.    [provider]  meclizine (ANTIVERT) 25 MG tablet TAKE 1 TABLET BY MOUTH 4 TIMES A DAY AS NEEDED FOR DIZZINESS 02/05/18   [provider]  metoprolol tartrate (LOPRESSOR) 25 MG tablet TAKE 1 TABLET TWO TIMES DAILY. MAKE OFFICE VISIT FOR MORE REFILLS. 12/27/20   Croitoru, Dani Gobble, MD  pantoprazole (PROTONIX) 40 MG tablet Take 1 tablet (40 mg total) by mouth daily. 01/01/17   Almyra Deforest, PA  rosuvastatin (CRESTOR) 10 MG tablet Take 10 mg by mouth daily. 12/01/20   [provider]  vitamin B-12 (CYANOCOBALAMIN) 500 MCG tablet Take 500 mcg by mouth daily. Patient not taking: Reported on 06/24/2020    [provider]    Family History Family History  Problem Relation Age of Onset  . CAD Mother   . CVA Mother   . Diabetes Mother   . Breast cancer Mother   . Parkinson's disease Mother   . Arthritis Mother   . Heart attack Father   . CVA Sister   . CAD Brother   . Diabetes Brother   . CVA Brother   . CAD Brother   . Diabetes Brother     Social History Social History   Tobacco Use  . Smoking status: Former Smoker    Years: 1.50    Types: Cigarettes    Start date: 01/28/1956  . Smokeless tobacco: Never Used  . Tobacco comment: "quit smoking cigarettes when I was 80 yr old"  Vaping Use  . Vaping Use: Never used  Substance Use Topics  . Alcohol use: No    Alcohol/week: 0.0 standard drinks  . Drug use: No     Allergies   Statins   Review of Systems Review of Systems  Musculoskeletal:       Pain of multiple sites  All other systems reviewed and are  negative.    Physical Exam Triage Vital Signs ED Triage Vitals  Enc Vitals Group     BP 02/08/21 1931 127/82     Pulse Rate 02/08/21 1931 100     Resp 02/08/21 1931 18     Temp 02/08/21 1931 98.1 F (36.7 C)     Temp Source 02/08/21 1931 Oral     SpO2 02/08/21 1931 96 %     Weight --      Height --      Head Circumference --      Peak Flow --      Pain Score 02/08/21 1930 7     Pain Loc --      Pain Edu? --  Excl. in GC? --    No data found.  Updated Vital Signs BP 127/82   Pulse 100   Temp 98.1 F (36.7 C) (Oral)   Resp 18   SpO2 96%   Visual Acuity Right Eye Distance:   Left Eye Distance:   Bilateral Distance:    Right Eye Near:   Left Eye Near:    Bilateral Near:     Physical Exam Vitals reviewed.  Constitutional:      Appearance: Normal appearance. He is not diaphoretic.  HENT:     Head: Normocephalic and atraumatic.     Mouth/Throat:     Mouth: Mucous membranes are moist.  Eyes:     Extraocular Movements: Extraocular movements intact.     Pupils: Pupils are equal, round, and reactive to light.  Cardiovascular:     Rate and Rhythm: Normal rate and regular rhythm.     Pulses:          Radial pulses are 2+ on the right side and 2+ on the left side.     Heart sounds: Normal heart sounds.     Comments: Radial pulses 2+ and equal Pulmonary:     Effort: Pulmonary effort is normal.     Breath sounds: Normal breath sounds.  Chest:     Comments: Well healed surgical scar overlying sternum Abdominal:     Palpations: Abdomen is soft.     Tenderness: There is no abdominal tenderness. There is no guarding or rebound.     Comments: No abd pain or pulsatile mass  Musculoskeletal:     Right lower leg: No edema.     Left lower leg: No edema.     Comments: Axilla/side pain and back pain is not reproducible. No spinous deformity, stepoff.   Skin:    General: Skin is warm.     Capillary Refill: Capillary refill takes less than 2 seconds.  Neurological:      General: No focal deficit present.     Mental Status: He is alert and oriented to person, place, and time.     Cranial Nerves: Cranial nerves are intact. No cranial nerve deficit.     Sensory: Sensation is intact. No sensory deficit.     Motor: Motor function is intact. No weakness.     Coordination: Coordination is intact. Romberg sign negative.     Gait: Gait is intact. Gait normal.     Comments: CN 2-12 grossly intact. Strength 5/5 in UEs and LEs.  Hard of hearing  Psychiatric:        Mood and Affect: Mood normal.        Behavior: Behavior normal.        Thought Content: Thought content normal.        Judgment: Judgment normal.      UC Treatments / Results  Labs (all labs ordered are listed, but only abnormal results are displayed) Labs Reviewed - No data to display  EKG   Radiology DG Chest 2 View  Result Date: 02/08/2021 CLINICAL DATA:  Left-sided pain EXAM: CHEST - 2 VIEW COMPARISON:  10/30/2016, CT 06/24/2020 FINDINGS: Post sternotomy changes. Numerous left greater than right calcified pleural plaques. No consolidation or pleural effusion. Stable cardiomediastinal silhouette with aortic atherosclerosis. No pneumothorax IMPRESSION: No active cardiopulmonary disease.  Calcified pleural plaques Electronically Signed   By: Donavan Foil M.D.   On: 02/08/2021 22:39    Procedures Procedures (including critical care time)  Medications Ordered in UC Medications - No data  to display  Initial Impression / Assessment and Plan / UC Course  I have reviewed the triage vital signs and the nursing notes.  Pertinent labs & imaging results that were available during my care of the patient were reviewed by me and considered in my medical decision making (see chart for details).      This patient is an 80 year old male presenting with pain of multiple regions, for about 1 day.  Pain is worst midline between shoulder blades.  This pain has persisted for most of the day.  Not  reproducible.  Also with pain under left axilla.  Not reducible.  This patient has a complex cardiac history including thoracic aortic aneurysm without repair. CABG. EKG today NSR, unchanged from 2021 EKG. Radial pulse 2+ in bilateral arms, pulses are equal and symmetric.   Given history of thoracic aortic aneurysm, I am concerned that some of the back pain could be related to this.  I am recommending that this patient had straight to [facility] emergency room for further evaluation and management.  Declines EMS in favor of transport in personal vehicle driven by daughter.  Final Clinical Impressions(s) / UC Diagnoses   Final diagnoses:  Thoracic aortic aneurysm without rupture ([place])  Hx of CABG  Pain of multiple sites     Discharge Instructions     -Head straight to [facility] emergency room for further evaluation and management of back pain, left side pain.  With your history of the thoracic aortic aneurysm, I am concerned that you are having some referred pain from this.  It is also possible that you are having an early cardiac event.  For further evaluation and cardiac work-up, you must head straight to the ED.  If you develop worsening of symptoms on the way, like new/worsening pain, dizziness, shortness of breath, weakness-stop and call 911 immediately.    ED Prescriptions    None     PDMP not reviewed this encounter.   Hazel Sams, PA-C 02/09/21 [phone]

## 2021-02-08 NOTE — ED Notes (Signed)
Patient is being discharged from the Urgent Care and sent to the Emergency Department via private vehicle. Per Marin Roberts, aPP, patient is in need of higher level of care due to extensive cardiac history, non-specific pain. Patient is aware and verbalizes understanding of plan of care.  Vitals:   02/08/21 1931  BP: 127/82  Pulse: 100  Resp: 18  Temp: 98.1 F (36.7 C)  SpO2: 96%

## 2021-02-09 LAB — TROPONIN I (HIGH SENSITIVITY): Troponin I (High Sensitivity): 10 ng/L (ref <18)

## 2021-02-09 NOTE — ED Notes (Signed)
Patient left on own accord °

## 2021-02-11 ENCOUNTER — Other Ambulatory Visit: Payer: Self-pay | Admitting: Family Medicine

## 2021-02-11 ENCOUNTER — Ambulatory Visit
Admission: RE | Admit: 2021-02-11 | Discharge: 2021-02-11 | Disposition: A | Discharge status: Home / Self Care | Payer: Medicare HMO | Source: Ambulatory Visit | Attending: Family Medicine | Admitting: Family Medicine

## 2021-02-11 DIAGNOSIS — R109 Unspecified abdominal pain: Secondary | ICD-10-CM

## 2021-02-11 DIAGNOSIS — R5383 Other fatigue: Secondary | ICD-10-CM | POA: Diagnosis not present

## 2021-02-11 DIAGNOSIS — J949 Pleural condition, unspecified: Secondary | ICD-10-CM | POA: Diagnosis not present

## 2021-02-11 DIAGNOSIS — R110 Nausea: Secondary | ICD-10-CM | POA: Diagnosis not present

## 2021-02-11 DIAGNOSIS — K5750 Diverticulosis of both small and large intestine without perforation or abscess without bleeding: Secondary | ICD-10-CM | POA: Diagnosis not present

## 2021-02-11 DIAGNOSIS — K4020 Bilateral inguinal hernia, without obstruction or gangrene, not specified as recurrent: Secondary | ICD-10-CM | POA: Diagnosis not present

## 2021-02-11 DIAGNOSIS — I700 Atherosclerosis of aorta: Secondary | ICD-10-CM | POA: Diagnosis not present

## 2021-02-11 MED ORDER — IOPAMIDOL (ISOVUE-300) INJECTION 61%
100.0000 mL | Freq: Once | INTRAVENOUS | Status: AC | PRN
  Administered 2021-02-11: 100 mL via INTRAVENOUS

## 2021-04-27 ENCOUNTER — Other Ambulatory Visit: Payer: Self-pay

## 2021-04-27 ENCOUNTER — Encounter: Payer: Self-pay | Admitting: Cardiovascular Disease

## 2021-04-27 ENCOUNTER — Ambulatory Visit: Payer: Medicare HMO | Admitting: Cardiovascular Disease

## 2021-04-27 VITALS — BP 114/78 | HR 74 | Ht 68.0 in | Wt 194.0 lb

## 2021-04-27 DIAGNOSIS — J61 Pneumoconiosis due to asbestos and other mineral fibers: Secondary | ICD-10-CM | POA: Diagnosis not present

## 2021-04-27 DIAGNOSIS — I712 Thoracic aortic aneurysm, without rupture, unspecified: Secondary | ICD-10-CM

## 2021-04-27 DIAGNOSIS — I10 Essential (primary) hypertension: Secondary | ICD-10-CM

## 2021-04-27 DIAGNOSIS — E785 Hyperlipidemia, unspecified: Secondary | ICD-10-CM | POA: Diagnosis not present

## 2021-04-27 DIAGNOSIS — E119 Type 2 diabetes mellitus without complications: Secondary | ICD-10-CM | POA: Diagnosis not present

## 2021-04-27 DIAGNOSIS — I358 Other nonrheumatic aortic valve disorders: Secondary | ICD-10-CM | POA: Diagnosis not present

## 2021-04-27 DIAGNOSIS — I25118 Atherosclerotic heart disease of native coronary artery with other forms of angina pectoris: Secondary | ICD-10-CM | POA: Diagnosis not present

## 2021-04-27 DIAGNOSIS — I700 Atherosclerosis of aorta: Secondary | ICD-10-CM | POA: Diagnosis not present

## 2021-04-27 NOTE — Patient Instructions (Signed)
Medication Instructions:  No changes *If you need a refill on your cardiac medications before your next appointment, please call your pharmacy*   Lab Work: None ordered If you have labs (blood work) drawn today and your tests are completely normal, you will receive your results only by: [place] (if you have MyChart) OR A paper copy in the mail If you have any lab test that is abnormal or we need to change your treatment, we will call you to review the results.   Testing/Procedures: Non-Cardiac CT Angiography (CTA), is a special type of CT scan that uses a computer to produce multi-dimensional views of major blood vessels throughout the body. In CT angiography, a contrast material is injected through an IV to help visualize the blood vessels  You are due to have this on September of 2023. We will call you to schedule this at a later date.  Follow-Up: At [facility], you and your health needs are our priority.  As part of our continuing mission to provide you with exceptional heart care, we have created designated Provider Care Teams.  These Care Teams include your primary Cardiologist (physician) and Advanced Practice Providers (APPs -  Physician Assistants and Nurse Practitioners) who all work together to provide you with the care you need, when you need it.  We recommend signing up for the patient portal called "MyChart".  Sign up information is provided on this After Visit Summary.  MyChart is used to connect with patients for Virtual Visits (Telemedicine).  Patients are able to view lab/test results, encounter notes, upcoming appointments, etc.  Non-urgent messages can be sent to your provider as well.   To learn more about what you can do with MyChart, go to [email or web address].    Your next appointment:   12 month(s)  The format for your next appointment:   In Person  Provider:   You may see Dr. Sallyanne Kuster or one of the following Advanced Practice Providers on your  designated Care Team:   Almyra Deforest, PA-C Fabian Sharp, PA-C or  [place], [place]

## 2021-04-27 NOTE — Progress Notes (Addendum)
Cardiology Office Note    Date:  04/27/2021   ID:  Trevor Ward, DOB 10-05-1940, MRN [id number]  PCP:  Mayra Neer, MD  Cardiologist:  Mairyn Lenahan Electrophysiologist:  None   Evaluation Performed:  Follow-Up Visit  Chief Complaint:  CAD  History of Present Illness:    Trevor Ward is a 80 y.o. male with a history of CAD s/p CABG (02/02/16:  CABG x4 with LIMA to LAD, SVG sequential to the first and second intermediate, SVG to PDA, Ceasar Mons, MD). 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  He has had a good year without any major cardiovascular complaints.  In May he had severe abdominal and back pain and was seen in the emergency room.  Labs were performed as well as an ECG, a chest x-ray and a CT of the abdomen and pelvis.  After waiting for 8 hours he left before he was actually seen by a physician.  His symptoms resolved.  His chest x-ray was remarkable for the absence of signs of pneumonia or heart failure, but he has the known asbestos related pleural plaques.  The abdominal CT showed some noninflamed diverticulosis in the duodenum and colon small nonincarcerated/nonstrangulated bilateral inguinal herniae and expected aortic atherosclerosis.  The ECG showed sinus rhythm with left atrial abnormality and old left anterior fascicular block.  Labs showed normal WBC and renal function and negative high-sensitivity troponin x2.  The patient specifically denies any chest pain at rest exertion, dyspnea at rest or with exertion, orthopnea, paroxysmal nocturnal dyspnea, syncope, palpitations, focal neurological deficits, intermittent claudication, lower extremity edema, unexplained weight  gain, cough, hemoptysis or wheezing.  He is very hard of hearing.  He could not really understand what I was saying until I removed my mask so that he can read my lips.  Still eating unhealthily: pork chops are preferred item.  Follow-up CT angiography of the chest showed a minimal increase in size in the ascending aorta to 4.0 cm, compared with 3.8 cm in 2020 and 3.9 cm in 2019.  Multiple pleural plaques and a stable 5 mm right lung subpleural nodules are again seen.  Glycemic control remains good with a hemoglobin A1c that was 6.6% in April of this year.  His LDL cholesterol was substantially higher this year, and he discovered that he had been missing his atorvastatin.  His wife has now taken responsibility for his medications.  His HDL remains quite low at 23 (chronic problem).  Past Medical History:  Diagnosis Date   Arthritis    "fingers" (02/01/2016)   Depression    Diabetes mellitus without complication (HCC)    GERD (gastroesophageal reflux disease)    High cholesterol    "can't take RX; made my legs weak" (02/01/2016)   History of gout    History of hiatal hernia    Hypertension    Peripheral vascular disease ([place])    carotid artery stenosis   Skin cancer of face     "the good kind"   Stroke [facility]) 2016   denies residual on 02/01/2016   Past Surgical History:  Procedure Laterality Date   CARDIAC CATHETERIZATION N/A 02/01/2016   Procedure: Left Heart Cath and Coronary Angiography;  Surgeon: Burnell Blanks, MD;  Location: [place] [facility];  Service: Cardiovascular;  Laterality: N/A;   COLONOSCOPY     CORONARY ARTERY BYPASS GRAFT N/A 02/02/2016   Procedure: CORONARY ARTERY BYPASS GRAFTING (CABG) times 4 using left internal mammary and left saphenous ven harvested by endoven ;  Surgeon: Trevor Isaac, MD;  Location: [place];  Service: Open Heart Surgery;  Laterality: N/A;   ENDARTERECTOMY Right 04/21/2015   Procedure: RIGHT CAROTID ENDARTERECTOMY;  Surgeon: Trevor Dutch,  MD;  Location: [place];  Service: Vascular;  Laterality: Right;   ESOPHAGOGASTRODUODENOSCOPY (EGD) WITH PROPOFOL N/A 01/03/2016   Procedure: ESOPHAGOGASTRODUODENOSCOPY (EGD) WITH PROPOFOL;  Surgeon: Trevor Fair, MD;  [facility];  Service: Endoscopy;  Laterality: N/A;   KNEE ARTHROSCOPY Right    open heart surgery  01/2016   PERIPHERAL VASCULAR CATHETERIZATION Bilateral 04/14/2015   Procedure: Carotid Angiography;  Surgeon: Trevor Mitchell, MD;  Location: [place] [facility];  Service: Cardiovascular;  Laterality: Bilateral;   SKIN CANCER EXCISION Right    "temple"   TEE WITHOUT CARDIOVERSION N/A 02/02/2016   Procedure: TRANSESOPHAGEAL ECHOCARDIOGRAM (TEE);  Surgeon: Trevor Isaac, MD;  Location: [place];  Service: Open Heart Surgery;  Laterality: N/A;     Current Meds  Medication Sig   allopurinol (ZYLOPRIM) 100 MG tablet Take 100 mg by mouth daily.    amitriptyline (ELAVIL) 25 MG tablet TAKE 1 TABLET BY MOUTH EVERYDAY AT BEDTIME   aspirin EC 81 MG EC tablet Take 1 tablet (81 mg total) by mouth daily.   buPROPion (WELLBUTRIN XL) 300 MG 24 hr tablet    colchicine 0.6 MG tablet    Glucosamine Sulfate 1000 MG TABS Take 2,000 mg by mouth daily.   meclizine (ANTIVERT) 25 MG tablet TAKE 1 TABLET BY MOUTH 4 TIMES A DAY AS NEEDED FOR DIZZINESS   metoprolol tartrate (LOPRESSOR) 25 MG tablet TAKE 1 TABLET TWO TIMES DAILY. MAKE OFFICE VISIT FOR MORE REFILLS.   pantoprazole (PROTONIX) 40 MG tablet Take 1 tablet (40 mg total) by mouth daily.   rosuvastatin (CRESTOR) 10 MG tablet Take 10 mg by mouth daily.   vitamin B-12 (CYANOCOBALAMIN) 500 MCG tablet Take 500 mcg by mouth daily.     Allergies:   Statins   Social History   Tobacco Use   Smoking status: Former    Years: 1.50    Types: Cigarettes    Start date: 01/28/1956   Smokeless tobacco: Never   Tobacco comments:    "quit smoking cigarettes when I was 80 yr old"  Vaping Use   Vaping Use: Never used  Substance Use Topics    Alcohol use: No    Alcohol/week: 0.0 standard drinks   Drug use: No     Family Hx: [name] family history includes Arthritis in his mother; Breast cancer in his mother; CAD in his brother, brother, and mother; CVA in his brother, mother, and sister; Diabetes in his brother, brother, and mother; Heart attack in his father; Parkinson's disease in his mother.  ROS:   Please see the history of present illness.    All other systems are reviewed and are negative.  Prior CV studies:   The following studies were reviewed today:  Labs 01/12/2020 Chol 181, HDL 25, LDL 125, TG 175 Hgb A1c 6.5% Creat 1.2, K 4.2, normal LFTs and TSH  Labs 01/17/2021 Cholesterol 153, HDL 23, LDL 106, triglycerides 133 Hemoglobin A1c 6.6%  Labs 02/11/2021 Hemoglobin 17.4, creatinine 1.36, potassium 4.1, ALT 16  Labs/Other Tests and Data Reviewed:    EKG:  Ordered  today, personally reviewed shows sinus rhythm left atrial abnormality, left anterior fascicular block, QTC 440 ms, without ischemic repolarization abnormalities Recent Labs: BMET    Component Value Date/Time   NA 132 (L) 02/08/2021 2236   K 4.1 02/08/2021 2236   CL 99 02/08/2021 2236   CO2 26 02/08/2021 2236   GLUCOSE 122 (H) 02/08/2021 2236   BUN 11 02/08/2021 2236   CREATININE 1.14 02/08/2021 2236   CREATININE 0.85 05/16/2016 0945   CALCIUM 8.8 (L) 02/08/2021 2236   GFRNONAA >60 02/08/2021 2236   GFRAA >60 02/08/2016 0405     Recent Lipid Panel Lab Results  Component Value Date/Time   CHOL 115 03/16/2017 08:55 AM   TRIG 168 (H) 03/16/2017 08:55 AM   HDL 23 (L) 03/16/2017 08:55 AM   CHOLHDL 5.0 03/16/2017 08:55 AM   CHOLHDL 6.6 (H) 09/27/2016 10:56 AM   LDLCALC 58 03/16/2017 08:55 AM   September 2019  total cholesterol 110, HDL 22, LDL 59,   triglycerides 142 April 2021    total cholesterol 181, HDL 25, LDL 125, triglycerides  175 01/17/2021 Cholesterol 153, HDL 23, LDL 106, triglycerides 133 Hemoglobin A1c 6.6%  Wt  Readings from Last 3 Encounters:  04/27/21 194 lb (88 kg)  02/08/21 193 lb (87.5 kg)  06/24/20 193 lb (87.5 kg)     Objective:    Vital Signs:  BP 114/78 (BP Location: Left Arm, Patient Position: Sitting, Cuff Size: Normal)   Pulse 74   Ht '5\' 8"'$  (1.727 m)   Wt 194 lb (88 kg)   BMI 29.50 kg/m     General: Alert, oriented x3, no distress, very hard of hearing Head: no evidence of trauma, PERRL, EOMI, no exophtalmos or lid lag, no myxedema, no xanthelasma; normal ears, nose and oropharynx Neck: normal jugular venous pulsations and no hepatojugular reflux; brisk carotid pulses without delay and no carotid bruits Chest: clear to auscultation, no signs of consolidation by percussion or palpation, normal fremitus, symmetrical and full respiratory excursions Cardiovascular: normal position and quality of the apical impulse, regular rhythm, normal first and second heart sounds, early peaking 2/6 aortic ejection murmur, no diastolic murmurs, rubs or gallops Abdomen: no tenderness or distention, no masses by palpation, no abnormal pulsatility or arterial bruits, normal bowel sounds, no hepatosplenomegaly Extremities: no clubbing, cyanosis or edema; 2+ radial, ulnar and brachial pulses bilaterally; 2+ right femoral, posterior tibial and dorsalis pedis pulses; 2+ left femoral, posterior tibial and dorsalis pedis pulses; no subclavian or femoral bruits Neurological: grossly nonfocal Psych: Normal mood and affect    ASSESSMENT & PLAN:    1. Thoracic aortic aneurysm without rupture ([place])   2. Coronary artery disease of native artery of native heart with stable angina pectoris ([place])   3. Dyslipidemia (high LDL; low HDL)   4. Essential hypertension   5. Asbestosis ([place])   6. Type 2 diabetes mellitus without complication, without long-term current use of insulin (HCC)       CAD s/p CABG: Remains asymptomatic on aspirin, statin, beta-blocker. Asc Ao Aneurysm: minimal increase in size to 40 mm.   Repeat his CT in September.  We discussed the fact that the older he gets the less appealing it would be to perform preventive repair of his ascending aortic aneurysm, especially as a redo sternotomy.  At some point we may decide to stop performing periodic CTs.  On the other hand, the CT is also useful in monitoring his lung nodules and pleural plaques. HLP: Has always had a  very low HDL cholesterol.  LDL was much better couple of years ago, but over the last 2 years he has been spotty in his compliance with statin.  His wife is now in control of his medication and he is taking much more consistently in the last few months. HTN/Orthostatic hypotension: Orthostatic dizziness resolved when he stopped taking isosorbide and he has not developed angina. Pleural plaques/lung nodules: Consistent with prior asbestos exposure. DM: Well-controlled. AV sclerosis: Last echocardiographic assessment was a TEE in 2017 that showed a 3 leaflet aortic valve with a fairly rigid noncoronary cusp and partial fusion of the right and noncoronary cusps.  He does not have any symptoms of aortic stenosis.  Consider repeat evaluation with echo in the next couple of years, sooner if he develops symptoms. Aortic atherosclerosis: Noted in the thoracic and abdominal aorta, on imaging studies.   COVID-19 Education: The signs and symptoms of COVID-19 were discussed with the patient and how to seek care for testing (follow up with PCP or arrange E-visit).  The importance of social distancing was discussed today.  Time:   Today, I have spent 19 minutes with the patient with telehealth technology discussing the above problems.     Medication Adjustments/Labs and Tests Ordered: Current medicines are reviewed at length with the patient today.  Concerns regarding medicines are outlined above.   Tests Ordered: Orders Placed This Encounter  Procedures   CT ANGIO CHEST AORTA W &/OR WO CONTRAST   Basic Metabolic Panel (BMET)      Medication Changes: No orders of the defined types were placed in this encounter.  Patient Instructions  Medication Instructions:  No changes *If you need a refill on your cardiac medications before your next appointment, please call your pharmacy*   Lab Work: None ordered If you have labs (blood work) drawn today and your tests are completely normal, you will receive your results only by: [place] (if you have MyChart) OR A paper copy in the mail If you have any lab test that is abnormal or we need to change your treatment, we will call you to review the results.   Testing/Procedures: Non-Cardiac CT Angiography (CTA), is a special type of CT scan that uses a computer to produce multi-dimensional views of major blood vessels throughout the body. In CT angiography, a contrast material is injected through an IV to help visualize the blood vessels  You are due to have this on September of 2023. We will call you to schedule this at a later date.  Follow-Up: At [facility], you and your health needs are our priority.  As part of our continuing mission to provide you with exceptional heart care, we have created designated Provider Care Teams.  These Care Teams include your primary Cardiologist (physician) and Advanced Practice Providers (APPs -  Physician Assistants and Nurse Practitioners) who all work together to provide you with the care you need, when you need it.  We recommend signing up for the patient portal called "MyChart".  Sign up information is provided on this After Visit Summary.  MyChart is used to connect with patients for Virtual Visits (Telemedicine).  Patients are able to view lab/test results, encounter notes, upcoming appointments, etc.  Non-urgent messages can be sent to your provider as well.   To learn more about what you can do with MyChart, go to [email or web address].    Your next appointment:   12 month(s)  The format for your next appointment:    In Person  Provider:   You may see Dr. Sallyanne Kuster or one of the following Advanced Practice Providers on your designated Care Team:   Almyra Deforest, PA-C Fabian Sharp, PA-C or  [place], [place] Disposition:  Follow up  12 months  Signed, Sanda Klein, MD  04/27/2021 1:03 PM    [place]

## 2021-05-16 ENCOUNTER — Ambulatory Visit (HOSPITAL_COMMUNITY): Payer: Medicare HMO

## 2021-07-12 ENCOUNTER — Other Ambulatory Visit: Payer: Self-pay

## 2021-07-12 DIAGNOSIS — E119 Type 2 diabetes mellitus without complications: Secondary | ICD-10-CM | POA: Diagnosis not present

## 2021-07-12 DIAGNOSIS — I25118 Atherosclerotic heart disease of native coronary artery with other forms of angina pectoris: Secondary | ICD-10-CM | POA: Diagnosis not present

## 2021-07-12 DIAGNOSIS — I358 Other nonrheumatic aortic valve disorders: Secondary | ICD-10-CM | POA: Diagnosis not present

## 2021-07-12 DIAGNOSIS — I712 Thoracic aortic aneurysm, without rupture, unspecified: Secondary | ICD-10-CM | POA: Diagnosis not present

## 2021-07-12 DIAGNOSIS — E785 Hyperlipidemia, unspecified: Secondary | ICD-10-CM | POA: Diagnosis not present

## 2021-07-12 DIAGNOSIS — I10 Essential (primary) hypertension: Secondary | ICD-10-CM | POA: Diagnosis not present

## 2021-07-12 DIAGNOSIS — I700 Atherosclerosis of aorta: Secondary | ICD-10-CM | POA: Diagnosis not present

## 2021-07-12 DIAGNOSIS — J61 Pneumoconiosis due to asbestos and other mineral fibers: Secondary | ICD-10-CM | POA: Diagnosis not present

## 2021-07-13 LAB — BASIC METABOLIC PANEL
BUN/Creatinine Ratio: 11 (ref 10–24)
BUN: 12 mg/dL (ref 8–27)
CO2: 24 mmol/L (ref 20–29)
Calcium: 9.5 mg/dL (ref 8.6–10.2)
Chloride: 102 mmol/L (ref 96–106)
Creatinine, Ser: 1.12 mg/dL (ref 0.76–1.27)
Glucose: 140 mg/dL — ABNORMAL HIGH (ref 70–99)
Potassium: 4.5 mmol/L (ref 3.5–5.2)
Sodium: 141 mmol/L (ref 134–144)
eGFR: 66 mL/min/{1.73_m2} (ref >59)

## 2021-07-14 ENCOUNTER — Encounter: Payer: Self-pay | Admitting: *Deleted

## 2021-07-18 ENCOUNTER — Ambulatory Visit (INDEPENDENT_AMBULATORY_CARE_PROVIDER_SITE_OTHER)
Admission: RE | Admit: 2021-07-18 | Discharge: 2021-07-18 | Disposition: A | Discharge status: Home / Self Care | Payer: Medicare HMO | Source: Ambulatory Visit | Attending: Cardiovascular Disease | Admitting: Cardiovascular Disease

## 2021-07-18 ENCOUNTER — Other Ambulatory Visit: Payer: Self-pay

## 2021-07-18 DIAGNOSIS — I700 Atherosclerosis of aorta: Secondary | ICD-10-CM | POA: Diagnosis not present

## 2021-07-18 DIAGNOSIS — J9811 Atelectasis: Secondary | ICD-10-CM | POA: Diagnosis not present

## 2021-07-18 DIAGNOSIS — I719 Aortic aneurysm of unspecified site, without rupture: Secondary | ICD-10-CM | POA: Diagnosis not present

## 2021-07-18 DIAGNOSIS — I712 Thoracic aortic aneurysm, without rupture, unspecified: Secondary | ICD-10-CM | POA: Diagnosis not present

## 2021-07-18 DIAGNOSIS — I7121 Aneurysm of the ascending aorta, without rupture: Secondary | ICD-10-CM | POA: Diagnosis not present

## 2021-07-18 DIAGNOSIS — K449 Diaphragmatic hernia without obstruction or gangrene: Secondary | ICD-10-CM | POA: Diagnosis not present

## 2021-07-18 MED ORDER — IOHEXOL 350 MG/ML SOLN
100.0000 mL | Freq: Once | INTRAVENOUS | Status: AC | PRN
  Administered 2021-07-18: 100 mL via INTRAVENOUS

## 2021-07-22 DIAGNOSIS — U071 COVID-19: Secondary | ICD-10-CM | POA: Diagnosis not present

## 2021-10-17 ENCOUNTER — Ambulatory Visit
Admission: RE | Admit: 2021-10-17 | Discharge: 2021-10-17 | Disposition: A | Discharge status: Home / Self Care | Payer: Medicare HMO | Source: Ambulatory Visit | Attending: Family Medicine | Admitting: Family Medicine

## 2021-10-17 ENCOUNTER — Other Ambulatory Visit: Payer: Self-pay

## 2021-10-17 ENCOUNTER — Other Ambulatory Visit: Payer: Self-pay | Admitting: Family Medicine

## 2021-10-17 DIAGNOSIS — R059 Cough, unspecified: Secondary | ICD-10-CM | POA: Diagnosis not present

## 2021-10-17 DIAGNOSIS — N529 Male erectile dysfunction, unspecified: Secondary | ICD-10-CM | POA: Diagnosis not present

## 2021-10-17 DIAGNOSIS — E1169 Type 2 diabetes mellitus with other specified complication: Secondary | ICD-10-CM | POA: Diagnosis not present

## 2021-10-17 DIAGNOSIS — M109 Gout, unspecified: Secondary | ICD-10-CM | POA: Diagnosis not present

## 2021-10-17 DIAGNOSIS — I119 Hypertensive heart disease without heart failure: Secondary | ICD-10-CM | POA: Diagnosis not present

## 2021-10-17 DIAGNOSIS — E782 Mixed hyperlipidemia: Secondary | ICD-10-CM | POA: Diagnosis not present

## 2021-10-17 DIAGNOSIS — I779 Disorder of arteries and arterioles, unspecified: Secondary | ICD-10-CM | POA: Diagnosis not present

## 2021-10-17 DIAGNOSIS — F411 Generalized anxiety disorder: Secondary | ICD-10-CM | POA: Diagnosis not present

## 2021-10-17 DIAGNOSIS — I700 Atherosclerosis of aorta: Secondary | ICD-10-CM | POA: Diagnosis not present

## 2021-10-17 DIAGNOSIS — Z0000 Encounter for general adult medical examination without abnormal findings: Secondary | ICD-10-CM | POA: Diagnosis not present

## 2021-12-26 ENCOUNTER — Other Ambulatory Visit: Payer: Self-pay | Admitting: Cardiovascular Disease

## 2022-02-08 ENCOUNTER — Emergency Department (HOSPITAL_COMMUNITY): Payer: Medicare HMO

## 2022-02-08 ENCOUNTER — Encounter (HOSPITAL_COMMUNITY): Payer: Self-pay | Admitting: Family Medicine

## 2022-02-08 ENCOUNTER — Inpatient Hospital Stay (HOSPITAL_COMMUNITY)
Admission: EM | Admit: 2022-02-08 | Discharge: 2022-02-15 | DRG: 522 | Disposition: A | Discharge status: Skilled Nursing Facility | Payer: Medicare HMO | Attending: Internal Medicine | Admitting: Internal Medicine

## 2022-02-08 DIAGNOSIS — Z043 Encounter for examination and observation following other accident: Secondary | ICD-10-CM | POA: Diagnosis not present

## 2022-02-08 DIAGNOSIS — Z96642 Presence of left artificial hip joint: Secondary | ICD-10-CM | POA: Diagnosis not present

## 2022-02-08 DIAGNOSIS — Z833 Family history of diabetes mellitus: Secondary | ICD-10-CM | POA: Diagnosis not present

## 2022-02-08 DIAGNOSIS — I252 Old myocardial infarction: Secondary | ICD-10-CM

## 2022-02-08 DIAGNOSIS — E119 Type 2 diabetes mellitus without complications: Secondary | ICD-10-CM | POA: Diagnosis not present

## 2022-02-08 DIAGNOSIS — Z888 Allergy status to other drugs, medicaments and biological substances status: Secondary | ICD-10-CM

## 2022-02-08 DIAGNOSIS — Z8261 Family history of arthritis: Secondary | ICD-10-CM

## 2022-02-08 DIAGNOSIS — K219 Gastro-esophageal reflux disease without esophagitis: Secondary | ICD-10-CM | POA: Diagnosis present

## 2022-02-08 DIAGNOSIS — M19041 Primary osteoarthritis, right hand: Secondary | ICD-10-CM | POA: Diagnosis present

## 2022-02-08 DIAGNOSIS — E7800 Pure hypercholesterolemia, unspecified: Secondary | ICD-10-CM | POA: Diagnosis present

## 2022-02-08 DIAGNOSIS — S72012A Unspecified intracapsular fracture of left femur, initial encounter for closed fracture: Principal | ICD-10-CM | POA: Diagnosis present

## 2022-02-08 DIAGNOSIS — I712 Thoracic aortic aneurysm, without rupture, unspecified: Secondary | ICD-10-CM | POA: Diagnosis present

## 2022-02-08 DIAGNOSIS — Z8249 Family history of ischemic heart disease and other diseases of the circulatory system: Secondary | ICD-10-CM

## 2022-02-08 DIAGNOSIS — S72002D Fracture of unspecified part of neck of left femur, subsequent encounter for closed fracture with routine healing: Secondary | ICD-10-CM | POA: Diagnosis not present

## 2022-02-08 DIAGNOSIS — E440 Moderate protein-calorie malnutrition: Secondary | ICD-10-CM | POA: Diagnosis not present

## 2022-02-08 DIAGNOSIS — Z85828 Personal history of other malignant neoplasm of skin: Secondary | ICD-10-CM

## 2022-02-08 DIAGNOSIS — Z87891 Personal history of nicotine dependence: Secondary | ICD-10-CM | POA: Diagnosis not present

## 2022-02-08 DIAGNOSIS — M1712 Unilateral primary osteoarthritis, left knee: Secondary | ICD-10-CM | POA: Diagnosis not present

## 2022-02-08 DIAGNOSIS — M25552 Pain in left hip: Secondary | ICD-10-CM | POA: Diagnosis not present

## 2022-02-08 DIAGNOSIS — W1839XA Other fall on same level, initial encounter: Secondary | ICD-10-CM | POA: Diagnosis present

## 2022-02-08 DIAGNOSIS — W19XXXA Unspecified fall, initial encounter: Secondary | ICD-10-CM

## 2022-02-08 DIAGNOSIS — M47816 Spondylosis without myelopathy or radiculopathy, lumbar region: Secondary | ICD-10-CM | POA: Diagnosis not present

## 2022-02-08 DIAGNOSIS — Z79899 Other long term (current) drug therapy: Secondary | ICD-10-CM

## 2022-02-08 DIAGNOSIS — S79912A Unspecified injury of left hip, initial encounter: Secondary | ICD-10-CM | POA: Diagnosis not present

## 2022-02-08 DIAGNOSIS — I10 Essential (primary) hypertension: Secondary | ICD-10-CM | POA: Diagnosis present

## 2022-02-08 DIAGNOSIS — M19042 Primary osteoarthritis, left hand: Secondary | ICD-10-CM | POA: Diagnosis present

## 2022-02-08 DIAGNOSIS — Z6830 Body mass index (BMI) 30.0-30.9, adult: Secondary | ICD-10-CM

## 2022-02-08 DIAGNOSIS — I959 Hypotension, unspecified: Secondary | ICD-10-CM | POA: Diagnosis not present

## 2022-02-08 DIAGNOSIS — S72009A Fracture of unspecified part of neck of unspecified femur, initial encounter for closed fracture: Secondary | ICD-10-CM | POA: Diagnosis present

## 2022-02-08 DIAGNOSIS — R29898 Other symptoms and signs involving the musculoskeletal system: Secondary | ICD-10-CM | POA: Diagnosis not present

## 2022-02-08 DIAGNOSIS — Z823 Family history of stroke: Secondary | ICD-10-CM

## 2022-02-08 DIAGNOSIS — M47812 Spondylosis without myelopathy or radiculopathy, cervical region: Secondary | ICD-10-CM | POA: Diagnosis not present

## 2022-02-08 DIAGNOSIS — F32A Depression, unspecified: Secondary | ICD-10-CM | POA: Diagnosis not present

## 2022-02-08 DIAGNOSIS — I25118 Atherosclerotic heart disease of native coronary artery with other forms of angina pectoris: Secondary | ICD-10-CM | POA: Diagnosis present

## 2022-02-08 DIAGNOSIS — I2510 Atherosclerotic heart disease of native coronary artery without angina pectoris: Secondary | ICD-10-CM | POA: Diagnosis not present

## 2022-02-08 DIAGNOSIS — J9811 Atelectasis: Secondary | ICD-10-CM | POA: Diagnosis not present

## 2022-02-08 DIAGNOSIS — Z7982 Long term (current) use of aspirin: Secondary | ICD-10-CM

## 2022-02-08 DIAGNOSIS — R778 Other specified abnormalities of plasma proteins: Secondary | ICD-10-CM | POA: Diagnosis present

## 2022-02-08 DIAGNOSIS — Z8673 Personal history of transient ischemic attack (TIA), and cerebral infarction without residual deficits: Secondary | ICD-10-CM

## 2022-02-08 DIAGNOSIS — S72002A Fracture of unspecified part of neck of left femur, initial encounter for closed fracture: Secondary | ICD-10-CM

## 2022-02-08 DIAGNOSIS — Z471 Aftercare following joint replacement surgery: Secondary | ICD-10-CM | POA: Diagnosis not present

## 2022-02-08 DIAGNOSIS — Y92008 Other place in unspecified non-institutional (private) residence as the place of occurrence of the external cause: Secondary | ICD-10-CM | POA: Diagnosis not present

## 2022-02-08 DIAGNOSIS — E875 Hyperkalemia: Secondary | ICD-10-CM | POA: Diagnosis present

## 2022-02-08 DIAGNOSIS — S0990XA Unspecified injury of head, initial encounter: Secondary | ICD-10-CM | POA: Diagnosis not present

## 2022-02-08 DIAGNOSIS — E1151 Type 2 diabetes mellitus with diabetic peripheral angiopathy without gangrene: Secondary | ICD-10-CM | POA: Diagnosis present

## 2022-02-08 DIAGNOSIS — Z951 Presence of aortocoronary bypass graft: Secondary | ICD-10-CM

## 2022-02-08 DIAGNOSIS — R410 Disorientation, unspecified: Secondary | ICD-10-CM | POA: Diagnosis not present

## 2022-02-08 DIAGNOSIS — G8918 Other acute postprocedural pain: Secondary | ICD-10-CM | POA: Diagnosis not present

## 2022-02-08 DIAGNOSIS — Z7401 Bed confinement status: Secondary | ICD-10-CM | POA: Diagnosis not present

## 2022-02-08 DIAGNOSIS — G319 Degenerative disease of nervous system, unspecified: Secondary | ICD-10-CM | POA: Diagnosis not present

## 2022-02-08 LAB — CBC WITH DIFFERENTIAL/PLATELET
Abs Immature Granulocytes: 0.05 10*3/uL (ref 0.00–0.07)
Basophils Absolute: 0 10*3/uL (ref 0.0–0.1)
Basophils Relative: 0 %
Eosinophils Absolute: 0.1 10*3/uL (ref 0.0–0.5)
Eosinophils Relative: 1 %
HCT: 51.7 % (ref 39.0–52.0)
Hemoglobin: 17.3 g/dL — ABNORMAL HIGH (ref 13.0–17.0)
Immature Granulocytes: 0 %
Lymphocytes Relative: 9 %
Lymphs Abs: 1.2 10*3/uL (ref 0.7–4.0)
MCH: 29.8 pg (ref 26.0–34.0)
MCHC: 33.5 g/dL (ref 30.0–36.0)
MCV: 89.1 fL (ref 80.0–100.0)
Monocytes Absolute: 0.7 10*3/uL (ref 0.1–1.0)
Monocytes Relative: 5 %
Neutro Abs: 10.8 10*3/uL — ABNORMAL HIGH (ref 1.7–7.7)
Neutrophils Relative %: 85 %
Platelets: 211 10*3/uL (ref 150–400)
RBC: 5.8 MIL/uL (ref 4.22–5.81)
RDW: 13.9 % (ref 11.5–15.5)
WBC: 12.8 10*3/uL — ABNORMAL HIGH (ref 4.0–10.5)
nRBC: 0 % (ref 0.0–0.2)

## 2022-02-08 LAB — COMPREHENSIVE METABOLIC PANEL
ALT: 17 U/L (ref 0–44)
AST: 32 U/L (ref 15–41)
Albumin: 4.4 g/dL (ref 3.5–5.0)
Alkaline Phosphatase: 67 U/L (ref 38–126)
Anion gap: 9 (ref 5–15)
BUN: 18 mg/dL (ref 8–23)
CO2: 25 mmol/L (ref 22–32)
Calcium: 9.2 mg/dL (ref 8.9–10.3)
Chloride: 104 mmol/L (ref 98–111)
Creatinine, Ser: 1.24 mg/dL (ref 0.61–1.24)
GFR, Estimated: 58 mL/min — ABNORMAL LOW (ref >60)
Glucose, Bld: 115 mg/dL — ABNORMAL HIGH (ref 70–99)
Potassium: 5.3 mmol/L — ABNORMAL HIGH (ref 3.5–5.1)
Sodium: 138 mmol/L (ref 135–145)
Total Bilirubin: 1.8 mg/dL — ABNORMAL HIGH (ref 0.3–1.2)
Total Protein: 8.3 g/dL — ABNORMAL HIGH (ref 6.5–8.1)

## 2022-02-08 LAB — TROPONIN I (HIGH SENSITIVITY)
Troponin I (High Sensitivity): 50 ng/L — ABNORMAL HIGH (ref <18)
Troponin I (High Sensitivity): 4 ng/L (ref <18)

## 2022-02-08 MED ORDER — FENTANYL CITRATE PF 50 MCG/ML IJ SOSY
50.0000 ug | PREFILLED_SYRINGE | Freq: Once | INTRAMUSCULAR | Status: AC
  Administered 2022-02-08: 50 ug via INTRAVENOUS
  Filled 2022-02-08: qty 1

## 2022-02-08 MED ORDER — CHLORHEXIDINE GLUCONATE 4 % EX LIQD
60.0000 mL | Freq: Once | CUTANEOUS | Status: AC
  Administered 2022-02-09: 4 via TOPICAL

## 2022-02-08 MED ORDER — CEFAZOLIN SODIUM-DEXTROSE 2-4 GM/100ML-% IV SOLN
2.0000 g | INTRAVENOUS | Status: AC
  Administered 2022-02-09: 2 g via INTRAVENOUS
  Filled 2022-02-08 (×2): qty 100

## 2022-02-08 MED ORDER — FENTANYL CITRATE PF 50 MCG/ML IJ SOSY
25.0000 ug | PREFILLED_SYRINGE | Freq: Once | INTRAMUSCULAR | Status: AC
  Administered 2022-02-08: 25 ug via INTRAMUSCULAR
  Filled 2022-02-08: qty 1

## 2022-02-08 MED ORDER — TRANEXAMIC ACID-NACL 1000-0.7 MG/100ML-% IV SOLN
1000.0000 mg | INTRAVENOUS | Status: AC
  Administered 2022-02-09: 1000 mg via INTRAVENOUS
  Filled 2022-02-08 (×2): qty 100

## 2022-02-08 MED ORDER — POVIDONE-IODINE 10 % EX SWAB
2.0000 "application " | Freq: Once | CUTANEOUS | Status: AC
  Administered 2022-02-09: 2 via TOPICAL

## 2022-02-08 NOTE — Consult Note (Signed)
Reason for Consult:left hip fracture ?Referring Physician: ED ? ?Trevor Ward is an 81 y.o. male.  ?HPI: Paitent presented to the ED after a fall at home in his driveway. He reports significant pain in the left hip hip and reports being unable to bear weight on the left leg. He reports a history of diabetes, PVD, heart disease with previous MI and CABG. Reports taking aspirin as blood thinner. Patient reports that he lives with family and is typically very stable but has been unsteady on his feet the past few weeks. He denies dizziness and loss of consciousness with fall.  ? ?Past Medical History:  ?Diagnosis Date  ? Arthritis   ? "fingers" (02/01/2016)  ? Depression   ? Diabetes mellitus without complication ([place])   ? GERD (gastroesophageal reflux disease)   ? High cholesterol   ? "can't take RX; made my legs weak" (02/01/2016)  ? History of gout   ? History of hiatal hernia   ? Hypertension   ? Peripheral vascular disease ([place])   ? carotid artery stenosis  ? Skin cancer of face   ?  "the good kind"  ? Stroke [facility]) 2016  ? denies residual on 02/01/2016  ? ? ?Past Surgical History:  ?Procedure Laterality Date  ? CARDIAC CATHETERIZATION N/A 02/01/2016  ? Procedure: Left Heart Cath and Coronary Angiography;  Surgeon: Burnell Blanks, MD;  Location: [place] [facility];  Service: Cardiovascular;  Laterality: N/A;  ? COLONOSCOPY    ? CORONARY ARTERY BYPASS GRAFT N/A 02/02/2016  ? Procedure: CORONARY ARTERY BYPASS GRAFTING (CABG) times 4 using left internal mammary and left saphenous ven harvested by endoven ;  Surgeon: Grace Isaac, MD;  Location: [place];  Service: Open Heart Surgery;  Laterality: N/A;  ? ENDARTERECTOMY Right 04/21/2015  ? Procedure: RIGHT CAROTID ENDARTERECTOMY;  Surgeon: Elam Dutch, MD;  Location: [facility];  Service: Vascular;  Laterality: Right;  ? ESOPHAGOGASTRODUODENOSCOPY (EGD) WITH PROPOFOL N/A 01/03/2016  ? Procedure: ESOPHAGOGASTRODUODENOSCOPY (EGD) WITH PROPOFOL;  Surgeon: Garlan Fair, MD;  [facility];  Service: Endoscopy;  Laterality: N/A;  ? KNEE ARTHROSCOPY Right   ? open heart surgery  01/2016  ? PERIPHERAL VASCULAR CATHETERIZATION Bilateral 04/14/2015  ? Procedure: Carotid Angiography;  Surgeon: Serafina Mitchell, MD;  Location: [place] [facility];  Service: Cardiovascular;  Laterality: Bilateral;  ? SKIN CANCER EXCISION Right   ? "temple"  ? TEE WITHOUT CARDIOVERSION N/A 02/02/2016  ? Procedure: TRANSESOPHAGEAL ECHOCARDIOGRAM (TEE);  Surgeon: Grace Isaac, MD;  Location: [place];  Service: Open Heart Surgery;  Laterality: N/A;  ? ? ?Family History  ?Problem Relation Age of Onset  ? CAD Mother   ? CVA Mother   ? Diabetes Mother   ? Breast cancer Mother   ? Parkinson's disease Mother   ? Arthritis Mother   ? Heart attack Father   ? CVA Sister   ? CAD Brother   ? Diabetes Brother   ? CVA Brother   ? CAD Brother   ? Diabetes Brother   ? ? ?Social History:  reports that he has quit smoking. His smoking use included cigarettes. He started smoking about 66 years ago. He has never used smokeless tobacco. He reports that he does not drink alcohol and does not use drugs. ? ?Allergies:  ?Allergies  ?Allergen Reactions  ? Statins Other (See Comments)  ?  Severe myalgias  ? ? ?Medications: I have reviewed the patient's current medications. ? ?Results for  orders placed or performed during the hospital encounter of 02/08/22 (from the past 48 hour(s))  ?CBC with Differential     Status: Abnormal  ? Collection Time: 02/08/22  5:59 PM  ?Result Value Ref Range  ? WBC 12.8 (H) 4.0 - 10.5 K/uL  ? RBC 5.80 4.22 - 5.81 MIL/uL  ? Hemoglobin 17.3 (H) 13.0 - 17.0 g/dL  ? HCT 51.7 39.0 - 52.0 %  ? MCV 89.1 80.0 - 100.0 fL  ? MCH 29.8 26.0 - 34.0 pg  ? MCHC 33.5 30.0 - 36.0 g/dL  ? RDW 13.9 11.5 - 15.5 %  ? Platelets 211 150 - 400 K/uL  ? nRBC 0.0 0.0 - 0.2 %  ? Neutrophils Relative % 85 %  ? Neutro Abs 10.8 (H) 1.7 - 7.7 K/uL  ? Lymphocytes Relative 9 %  ? Lymphs Abs 1.2 0.7 - 4.0 K/uL  ? Monocytes  Relative 5 %  ? Monocytes Absolute 0.7 0.1 - 1.0 K/uL  ? Eosinophils Relative 1 %  ? Eosinophils Absolute 0.1 0.0 - 0.5 K/uL  ? Basophils Relative 0 %  ? Basophils Absolute 0.0 0.0 - 0.1 K/uL  ? Immature Granulocytes 0 %  ? Abs Immature Granulocytes 0.05 0.00 - 0.07 K/uL  ?  Comment: Performed at [facility], [place] [address], [place], [place] [address]  ?Comprehensive metabolic panel     Status: Abnormal  ? Collection Time: 02/08/22  5:59 PM  ?Result Value Ref Range  ? Sodium 138 135 - 145 mmol/L  ? Potassium 5.3 (H) 3.5 - 5.1 mmol/L  ? Chloride 104 98 - 111 mmol/L  ? CO2 25 22 - 32 mmol/L  ? Glucose, Bld 115 (H) 70 - 99 mg/dL  ?  Comment: Glucose reference range applies only to samples taken after fasting for at least 8 hours.  ? BUN 18 8 - 23 mg/dL  ? Creatinine, Ser 1.24 0.61 - 1.24 mg/dL  ? Calcium 9.2 8.9 - 10.3 mg/dL  ? Total Protein 8.3 (H) 6.5 - 8.1 g/dL  ? Albumin 4.4 3.5 - 5.0 g/dL  ? AST 32 15 - 41 U/L  ? ALT 17 0 - 44 U/L  ? Alkaline Phosphatase 67 38 - 126 U/L  ? Total Bilirubin 1.8 (H) 0.3 - 1.2 mg/dL  ? GFR, Estimated 58 (L) >60 mL/min  ?  Comment: (NOTE) ?Calculated using the CKD-EPI Creatinine Equation (2021) ?  ? Anion gap 9 5 - 15  ?  Comment: Performed at [facility], [place] [address] [place], [place] [address]  ?Troponin I (High Sensitivity)     Status: Abnormal  ? Collection Time: 02/08/22  6:00 PM  ?Result Value Ref Range  ? Troponin I (High Sensitivity) 50 (H) <18 ng/L  ?  Comment: (NOTE) ?Elevated high sensitivity troponin I (hsTnI) values and significant  ?changes across serial measurements may suggest ACS but many other  ?chronic and acute conditions are known to elevate hsTnI results.  ?Refer to the "Links" section for chest pain algorithms and additional  ?guidance. ?Performed at [facility], [address] ?[place], [place] [address] ?  ? ? ?CT Head Wo Contrast ? ?Result Date: 02/08/2022 ?CLINICAL DATA:  Fall. EXAM: CT HEAD  WITHOUT CONTRAST CT CERVICAL SPINE WITHOUT CONTRAST TECHNIQUE: Multidetector CT imaging of the head and cervical spine was performed following the standard protocol without intravenous contrast. Multiplanar CT image reconstructions of the cervical spine were also generated. RADIATION DOSE REDUCTION: This exam was  performed according to the departmental dose-optimization program which includes automated exposure control, adjustment of the mA and/or kV according to patient size and/or use of iterative reconstruction technique. COMPARISON:  CT head 04/11/2015 FINDINGS: CT HEAD FINDINGS Brain: Negative for acute infarct, hemorrhage, mass. Progressive atrophy and progressive chronic microvascular ischemic change in the white matter compared with 2016. Vascular: Negative for hyperdense vessel. Skull: Negative Sinuses/Orbits: Paranasal sinuses clear.  No orbital lesion. Other: None CT CERVICAL SPINE FINDINGS Alignment: Mild anterolisthesis C3-4. Straightening of the cervical lordosis. Skull base and vertebrae: Negative for fracture Soft tissues and spinal canal: No acute soft tissue abnormality. Disc levels: Multilevel disc and facet degeneration. Disc degeneration most prominent at C4-5, C5-6, C6-7. Moderate left foraminal narrowing C5-6 and C6-7 due to spurring. Upper chest: Lung apices clear bilaterally Other: None IMPRESSION: 1. No acute intracranial abnormality progressive atrophy and chronic microvascular ischemic change since 2016 2. Negative for cervical spine fracture. Moderate cervical spondylosis. Electronically Signed   By: Franchot Gallo M.D.   On: 02/08/2022 18:42  ? ?CT Cervical Spine Wo Contrast ? ?Result Date: 02/08/2022 ?CLINICAL DATA:  Fall. EXAM: CT HEAD WITHOUT CONTRAST CT CERVICAL SPINE WITHOUT CONTRAST TECHNIQUE: Multidetector CT imaging of the head and cervical spine was performed following the standard protocol without intravenous contrast. Multiplanar CT image reconstructions of the cervical spine  were also generated. RADIATION DOSE REDUCTION: This exam was performed according to the departmental dose-optimization program which includes automated exposure control, adjustment of the mA and/or kV accordin

## 2022-02-08 NOTE — ED Triage Notes (Signed)
Pt arrives via [facility] from home after a fall in his driveway. Pt injured his L hip and has been unable to bear weight on his leg afterwards. Pt denies head injury/LOC. Not on blood thinners per pt. Pt A+Ox4 on arrival.  ?

## 2022-02-08 NOTE — ED Provider Notes (Signed)
[facility] DEPT Provider Note   CSN: [id number] Arrival date & time: 02/08/22  1357     History  Chief Complaint  Patient presents with   Fall   Hip Injury    Trevor Ward is a 81 y.o. male.  HPI      81 year old male with a history of diabetes, hyperlipidemia, hypertension, peripheral vascular disease, CAD/CABG, CVA presents with concern for fall and left hip pain.  Reports he had prior history of vertigo, and had not had issues for a long time, however today after he got out of the car he had a sudden sensation of vertigo and fell down.  After this, his vertigo completely resolved.  It only lasted a second.  Denies any other numbness, weakness, facial droop, difficulty talking, other difficulty walking, vision changes.  When he fell he fell onto his left elbow and left hip.  He reports his elbow feels okay and is able to bend and straighten it, however has pain in his hip.  Has not been able to walk since the fall.  Denies hitting his head, lightheadedness, loss of consciousness but does acknowledge some neck pain.  Denies current back pain, chest pain, abdominal pain, nausea, vomiting, diarrhea or black or bloody stools.  Denies fever, cough or recent illness.  His daughter reports that he has been complaining of chest pain at home, although he reports he has not had chest pain for the last 2 days.  They report he has had dyspnea on exertion.    Past Medical History:  Diagnosis Date   Arthritis    "fingers" (02/01/2016)   Depression    Diabetes mellitus without complication (HCC)    GERD (gastroesophageal reflux disease)    High cholesterol    "can't take RX; made my legs weak" (02/01/2016)   History of gout    History of hiatal hernia    Hypertension    Peripheral vascular disease ([place])    carotid artery stenosis   Skin cancer of face     "the good kind"   Stroke [facility]) 2016   denies residual on 02/01/2016    Past Surgical History:   Procedure Laterality Date   CARDIAC CATHETERIZATION N/A 02/01/2016   Procedure: Left Heart Cath and Coronary Angiography;  Surgeon: Burnell Blanks, MD;  Location: [place] [facility];  Service: Cardiovascular;  Laterality: N/A;   COLONOSCOPY     CORONARY ARTERY BYPASS GRAFT N/A 02/02/2016   Procedure: CORONARY ARTERY BYPASS GRAFTING (CABG) times 4 using left internal mammary and left saphenous ven harvested by endoven ;  Surgeon: Grace Isaac, MD;  Location: [facility];  Service: Open Heart Surgery;  Laterality: N/A;   ENDARTERECTOMY Right 04/21/2015   Procedure: RIGHT CAROTID ENDARTERECTOMY;  Surgeon: Elam Dutch, MD;  Location: [place];  Service: Vascular;  Laterality: Right;   ESOPHAGOGASTRODUODENOSCOPY (EGD) WITH PROPOFOL N/A 01/03/2016   Procedure: ESOPHAGOGASTRODUODENOSCOPY (EGD) WITH PROPOFOL;  Surgeon: Garlan Fair, MD;  Location: [facility];  Service: Endoscopy;  Laterality: N/A;   KNEE ARTHROSCOPY Right    open heart surgery  01/2016   PERIPHERAL VASCULAR CATHETERIZATION Bilateral 04/14/2015   Procedure: Carotid Angiography;  Surgeon: Serafina Mitchell, MD;  Location: [facility];  Service: Cardiovascular;  Laterality: Bilateral;   SKIN CANCER EXCISION Right    "temple"   TEE WITHOUT CARDIOVERSION N/A 02/02/2016   Procedure: TRANSESOPHAGEAL ECHOCARDIOGRAM (TEE);  Surgeon: Grace Isaac, MD;  Location: [place];  Service: Open  Heart Surgery;  Laterality: N/A;     Home Medications Prior to Admission medications   Medication Sig Start Date End Date Taking? Authorizing Provider  allopurinol (ZYLOPRIM) 100 MG tablet Take 100 mg by mouth daily.  01/13/16  Yes [provider]  amitriptyline (ELAVIL) 25 MG tablet Take 25 mg by mouth at bedtime. 04/19/19  Yes [provider]  aspirin EC 81 MG EC tablet Take 1 tablet (81 mg total) by mouth daily. 02/10/16  Yes Barrett, Lodema Hong, PA-C  buPROPion (WELLBUTRIN XL) 300 MG 24 hr tablet  05/19/19  Yes [provider]  colchicine 0.6 MG tablet  03/27/16  Yes [provider]  Glucosamine Sulfate 1000 MG TABS Take 2,000 mg by mouth daily.   Yes [provider]  meclizine (ANTIVERT) 25 MG tablet Take 25 mg by mouth 4 (four) times daily as needed for dizziness. 02/05/18  Yes [provider]  metoprolol tartrate (LOPRESSOR) 25 MG tablet TAKE 1 TABLET TWO TIMES DAILY. MAKE OFFICE VISIT FOR MORE REFILLS. Patient taking differently: Take 25 mg by mouth 2 (two) times daily. 12/26/21  Yes Croitoru, Mihai, MD  pantoprazole (PROTONIX) 40 MG tablet Take 1 tablet (40 mg total) by mouth daily. 01/01/17  Yes Almyra Deforest, PA  rosuvastatin (CRESTOR) 10 MG tablet Take 10 mg by mouth daily. 12/01/20  Yes [provider]  vitamin B-12 (CYANOCOBALAMIN) 500 MCG tablet Take 500 mcg by mouth daily. Patient not taking: Reported on 02/08/2022    [provider]      Allergies    Statins    Review of Systems   Review of Systems  Physical Exam Updated Vital Signs BP 126/86   Pulse (!) 52   Temp 98.1 F (36.7 C) (Oral)   Resp 17   Ht '5\' 8"'$  (1.727 m)   Wt 90.7 kg   SpO2 95%   BMI 30.41 kg/m  Physical Exam Vitals and nursing note reviewed.  Constitutional:      General: He is not in acute distress.    Appearance: He is well-developed. He is not diaphoretic.  HENT:     Head: Normocephalic and atraumatic.  Eyes:     General: No visual field deficit.    Conjunctiva/sclera: Conjunctivae normal.  Cardiovascular:     Rate and Rhythm: Normal rate and regular rhythm.     Heart sounds: Normal heart sounds. No murmur heard.   No friction rub. No gallop.  Pulmonary:     Effort: Pulmonary effort is normal. No respiratory distress.     Breath sounds: Normal breath sounds. No wheezing or rales.  Abdominal:     General: There is no distension.     Palpations: Abdomen is soft.     Tenderness: There is no abdominal tenderness. There is no guarding.  Musculoskeletal:         General: Tenderness (left hip, cspine "sore") present. No swelling or deformity.     Cervical back: Normal range of motion.  Skin:    General: Skin is warm and dry.  Neurological:     Mental Status: He is alert and oriented to person, place, and time.     Cranial Nerves: No cranial nerve deficit, dysarthria or facial asymmetry.     Sensory: Sensation is intact. No sensory deficit.     Motor: Weakness: unable to assess LLE due to pain, distal strength intact.    ED Results / Procedures / Treatments   Labs (all labs ordered are listed, but only  abnormal results are displayed) Labs Reviewed  CBC WITH DIFFERENTIAL/PLATELET - Abnormal; Notable for the following components:      Result Value   WBC 12.8 (*)    Hemoglobin 17.3 (*)    Neutro Abs 10.8 (*)    All other components within normal limits  COMPREHENSIVE METABOLIC PANEL - Abnormal; Notable for the following components:   Potassium 5.3 (*)    Glucose, Bld 115 (*)    Total Protein 8.3 (*)    Total Bilirubin 1.8 (*)    GFR, Estimated 58 (*)    All other components within normal limits  TROPONIN I (HIGH SENSITIVITY) - Abnormal; Notable for the following components:   Troponin I (High Sensitivity) 50 (*)    All other components within normal limits  URINALYSIS, ROUTINE W REFLEX MICROSCOPIC  CBC WITH DIFFERENTIAL/PLATELET  COMPREHENSIVE METABOLIC PANEL  TYPE AND SCREEN  TROPONIN I (HIGH SENSITIVITY)    EKG EKG Interpretation  Date/Time:  Wednesday Feb 08 2022 14:10:33 EDT Ventricular Rate:  77 PR Interval:  173 QRS Duration: 104 QT Interval:  411 QTC Calculation: 466 R Axis:   -13 Text Interpretation: Sinus rhythm Left ventricular hypertrophy No significant change since last tracing Confirmed by Gareth Morgan [phone]) on 02/08/2022 3:39:15 PM  Radiology CT Head Wo Contrast  Result Date: 02/08/2022 CLINICAL DATA:  Fall. EXAM: CT HEAD WITHOUT CONTRAST CT CERVICAL SPINE WITHOUT CONTRAST TECHNIQUE: Multidetector CT imaging  of the head and cervical spine was performed following the standard protocol without intravenous contrast. Multiplanar CT image reconstructions of the cervical spine were also generated. RADIATION DOSE REDUCTION: This exam was performed according to the departmental dose-optimization program which includes automated exposure control, adjustment of the mA and/or kV according to patient size and/or use of iterative reconstruction technique. COMPARISON:  CT head 04/11/2015 FINDINGS: CT HEAD FINDINGS Brain: Negative for acute infarct, hemorrhage, mass. Progressive atrophy and progressive chronic microvascular ischemic change in the white matter compared with 2016. Vascular: Negative for hyperdense vessel. Skull: Negative Sinuses/Orbits: Paranasal sinuses clear.  No orbital lesion. Other: None CT CERVICAL SPINE FINDINGS Alignment: Mild anterolisthesis C3-4. Straightening of the cervical lordosis. Skull base and vertebrae: Negative for fracture Soft tissues and spinal canal: No acute soft tissue abnormality. Disc levels: Multilevel disc and facet degeneration. Disc degeneration most prominent at C4-5, C5-6, C6-7. Moderate left foraminal narrowing C5-6 and C6-7 due to spurring. Upper chest: Lung apices clear bilaterally Other: None IMPRESSION: 1. No acute intracranial abnormality progressive atrophy and chronic microvascular ischemic change since 2016 2. Negative for cervical spine fracture. Moderate cervical spondylosis. Electronically Signed   By: Franchot Gallo M.D.   On: 02/08/2022 18:42   CT Cervical Spine Wo Contrast  Result Date: 02/08/2022 CLINICAL DATA:  Fall. EXAM: CT HEAD WITHOUT CONTRAST CT CERVICAL SPINE WITHOUT CONTRAST TECHNIQUE: Multidetector CT imaging of the head and cervical spine was performed following the standard protocol without intravenous contrast. Multiplanar CT image reconstructions of the cervical spine were also generated. RADIATION DOSE REDUCTION: This exam was performed according to the  departmental dose-optimization program which includes automated exposure control, adjustment of the mA and/or kV according to patient size and/or use of iterative reconstruction technique. COMPARISON:  CT head 04/11/2015 FINDINGS: CT HEAD FINDINGS Brain: Negative for acute infarct, hemorrhage, mass. Progressive atrophy and progressive chronic microvascular ischemic change in the white matter compared with 2016. Vascular: Negative for hyperdense vessel. Skull: Negative Sinuses/Orbits: Paranasal sinuses clear.  No orbital lesion. Other: None CT CERVICAL SPINE FINDINGS Alignment: Mild anterolisthesis C3-4.  Straightening of the cervical lordosis. Skull base and vertebrae: Negative for fracture Soft tissues and spinal canal: No acute soft tissue abnormality. Disc levels: Multilevel disc and facet degeneration. Disc degeneration most prominent at C4-5, C5-6, C6-7. Moderate left foraminal narrowing C5-6 and C6-7 due to spurring. Upper chest: Lung apices clear bilaterally Other: None IMPRESSION: 1. No acute intracranial abnormality progressive atrophy and chronic microvascular ischemic change since 2016 2. Negative for cervical spine fracture. Moderate cervical spondylosis. Electronically Signed   By: Franchot Gallo M.D.   On: 02/08/2022 18:42   DG Chest Portable 1 View  Result Date: 02/08/2022 CLINICAL DATA:  Hip fracture EXAM: PORTABLE CHEST 1 VIEW COMPARISON:  Chest x-ray 10/17/2021.  Chest CT 07/18/2021 FINDINGS: Postop CABG. Heart size within normal limits. Negative for heart failure. Mild bibasilar airspace disease likely atelectasis.  No effusion. Bilateral calcified pleural plaques. IMPRESSION: Mild bibasilar atelectasis. Calcified pleural plaques bilaterally. Electronically Signed   By: Franchot Gallo M.D.   On: 02/08/2022 18:15   DG Hip Unilat With Pelvis 2-3 Views Left  Result Date: 02/08/2022 CLINICAL DATA:  Trauma, fall EXAM: DG HIP (WITH OR WITHOUT PELVIS) 2-3V LEFT COMPARISON:  None Available.  FINDINGS: There is comminuted fracture in the neck of left femur. There is no significant displacement or angulation at the fracture site. Degenerative changes are noted in the visualized lumbar spine. Vascular calcifications are seen in the soft tissues. Surgical clips are seen in the left upper thigh. IMPRESSION: Comminuted, essentially undisplaced fracture is seen in the neck of left femur. Electronically Signed   By: Elmer Picker M.D.   On: 02/08/2022 16:59    Procedures Procedures    Medications Ordered in ED Medications  chlorhexidine (HIBICLENS) 4 % liquid 4 application. (has no administration in time range)  povidone-iodine 10 % swab 2 application. (has no administration in time range)  ceFAZolin (ANCEF) IVPB 2g/100 mL premix (has no administration in time range)  tranexamic acid (CYKLOKAPRON) IVPB 1,000 mg (has no administration in time range)  metoprolol tartrate (LOPRESSOR) tablet 25 mg (has no administration in time range)  rosuvastatin (CRESTOR) tablet 10 mg (has no administration in time range)  amitriptyline (ELAVIL) tablet 25 mg (has no administration in time range)  meclizine (ANTIVERT) tablet 25 mg (has no administration in time range)  pantoprazole (PROTONIX) EC tablet 40 mg (has no administration in time range)  fentaNYL (SUBLIMAZE) injection 12.5-25 mcg (has no administration in time range)  senna-docusate (Senokot-S) tablet 1 tablet (has no administration in time range)  0.9 %  sodium chloride infusion (has no administration in time range)  fentaNYL (SUBLIMAZE) injection 25 mcg (25 mcg Intramuscular Given 02/08/22 1709)  fentaNYL (SUBLIMAZE) injection 50 mcg (50 mcg Intravenous Given 02/08/22 2107)    ED Course/ Medical Decision Making/ A&P                           Medical Decision Making Amount and/or Complexity of Data Reviewed Independent Historian:     Details: daughter Labs: ordered. Decision-making details documented in ED Course. Radiology: ordered  and independent interpretation performed. Decision-making details documented in ED Course. ECG/medicine tests: ordered.  Risk Prescription drug management. Decision regarding hospitalization.   81 year old male with a history of diabetes, hyperlipidemia, hypertension, peripheral vascular disease, CAD/CABG, CVA presents with concern for fall and left hip pain.  Unclear by patient history of fall was mechanical, related to the brief episode of vertigo.  He does report he has had vertigo  like this in the past, and that that sensation only lasted a second prior to resolving.  Did not not have other neurologic symptoms with it, feel TIA is less likely in the setting of him having similar symptoms before.  CT head was completed and showed no sign of intracranial abnormalities, and cervical spine CT also did not show signs evidence of fracture.  X-ray of the left hip shows femoral neck fracture.  He is neurovascularly intact.  Discussed with Dr. Tamera Punt of orthopedics, who recommends hospitalist admission with likely surgery tomorrow.  On my reevaluation of the patient, his daughter does report that he has been having chest pain at home, and while he has not had a the last couple of days, has had some prior dyspnea on exertion and episodes of chest pain.  Ordered a screening troponin which was elevated at 50.  Delta troponin is pending at time of hospitalization.  He has not had any chest pain today.  Hospitalist will consult Cardiology regarding this, discussion of surgical readiness.           Final Clinical Impression(s) / ED Diagnoses Final diagnoses:  Closed fracture of left hip, initial encounter ([place])  Fall, initial encounter  Troponin level elevated    Rx / DC Orders ED Discharge Orders     None         Gareth Morgan, MD 02/09/22 [phone]

## 2022-02-09 ENCOUNTER — Inpatient Hospital Stay (HOSPITAL_COMMUNITY): Payer: Medicare HMO

## 2022-02-09 ENCOUNTER — Inpatient Hospital Stay (HOSPITAL_COMMUNITY): Payer: Medicare HMO | Admitting: Certified Registered Nurse Anesthetist

## 2022-02-09 ENCOUNTER — Encounter (HOSPITAL_COMMUNITY)
Admission: EM | Disposition: A | Discharge status: Skilled Nursing Facility | Payer: Self-pay | Source: Home / Self Care | Attending: Internal Medicine

## 2022-02-09 ENCOUNTER — Other Ambulatory Visit: Payer: Self-pay

## 2022-02-09 DIAGNOSIS — I2510 Atherosclerotic heart disease of native coronary artery without angina pectoris: Secondary | ICD-10-CM | POA: Diagnosis not present

## 2022-02-09 DIAGNOSIS — E440 Moderate protein-calorie malnutrition: Secondary | ICD-10-CM | POA: Insufficient documentation

## 2022-02-09 DIAGNOSIS — I10 Essential (primary) hypertension: Secondary | ICD-10-CM

## 2022-02-09 DIAGNOSIS — E119 Type 2 diabetes mellitus without complications: Secondary | ICD-10-CM

## 2022-02-09 DIAGNOSIS — S72009A Fracture of unspecified part of neck of unspecified femur, initial encounter for closed fracture: Secondary | ICD-10-CM | POA: Diagnosis present

## 2022-02-09 DIAGNOSIS — G8918 Other acute postprocedural pain: Secondary | ICD-10-CM | POA: Diagnosis not present

## 2022-02-09 DIAGNOSIS — S72002A Fracture of unspecified part of neck of left femur, initial encounter for closed fracture: Secondary | ICD-10-CM

## 2022-02-09 DIAGNOSIS — R778 Other specified abnormalities of plasma proteins: Secondary | ICD-10-CM | POA: Diagnosis present

## 2022-02-09 HISTORY — PX: TOTAL HIP ARTHROPLASTY: SHX124

## 2022-02-09 LAB — COMPREHENSIVE METABOLIC PANEL
ALT: 13 U/L (ref 0–44)
AST: 16 U/L (ref 15–41)
Albumin: 3.9 g/dL (ref 3.5–5.0)
Alkaline Phosphatase: 58 U/L (ref 38–126)
Anion gap: 9 (ref 5–15)
BUN: 22 mg/dL (ref 8–23)
CO2: 21 mmol/L — ABNORMAL LOW (ref 22–32)
Calcium: 8.5 mg/dL — ABNORMAL LOW (ref 8.9–10.3)
Chloride: 104 mmol/L (ref 98–111)
Creatinine, Ser: 1.08 mg/dL (ref 0.61–1.24)
GFR, Estimated: >60 mL/min (ref >60)
Glucose, Bld: 165 mg/dL — ABNORMAL HIGH (ref 70–99)
Potassium: 3.9 mmol/L (ref 3.5–5.1)
Sodium: 134 mmol/L — ABNORMAL LOW (ref 135–145)
Total Bilirubin: 1.8 mg/dL — ABNORMAL HIGH (ref 0.3–1.2)
Total Protein: 7.5 g/dL (ref 6.5–8.1)

## 2022-02-09 LAB — CBC WITH DIFFERENTIAL/PLATELET
Abs Immature Granulocytes: 0.05 10*3/uL (ref 0.00–0.07)
Basophils Absolute: 0 10*3/uL (ref 0.0–0.1)
Basophils Relative: 0 %
Eosinophils Absolute: 0.2 10*3/uL (ref 0.0–0.5)
Eosinophils Relative: 2 %
HCT: 46.8 % (ref 39.0–52.0)
Hemoglobin: 16.4 g/dL (ref 13.0–17.0)
Immature Granulocytes: 0 %
Lymphocytes Relative: 6 %
Lymphs Abs: 0.8 10*3/uL (ref 0.7–4.0)
MCH: 30.7 pg (ref 26.0–34.0)
MCHC: 35 g/dL (ref 30.0–36.0)
MCV: 87.5 fL (ref 80.0–100.0)
Monocytes Absolute: 0.6 10*3/uL (ref 0.1–1.0)
Monocytes Relative: 5 %
Neutro Abs: 10.3 10*3/uL — ABNORMAL HIGH (ref 1.7–7.7)
Neutrophils Relative %: 87 %
Platelets: 209 10*3/uL (ref 150–400)
RBC: 5.35 MIL/uL (ref 4.22–5.81)
RDW: 13.7 % (ref 11.5–15.5)
WBC: 12 10*3/uL — ABNORMAL HIGH (ref 4.0–10.5)
nRBC: 0 % (ref 0.0–0.2)

## 2022-02-09 LAB — URINALYSIS, ROUTINE W REFLEX MICROSCOPIC
Bilirubin Urine: NEGATIVE
Glucose, UA: NEGATIVE mg/dL
Ketones, ur: 20 mg/dL — AB
Nitrite: POSITIVE — AB
Protein, ur: NEGATIVE mg/dL
Specific Gravity, Urine: 1.015 (ref 1.005–1.030)
WBC, UA: >50 WBC/hpf — ABNORMAL HIGH (ref 0–5)
pH: 5 (ref 5.0–8.0)

## 2022-02-09 LAB — GLUCOSE, CAPILLARY: Glucose-Capillary: 140 mg/dL — ABNORMAL HIGH (ref 70–99)

## 2022-02-09 LAB — TYPE AND SCREEN
ABO/RH(D): O POS
Antibody Screen: NEGATIVE

## 2022-02-09 SURGERY — ARTHROPLASTY, HIP, TOTAL, ANTERIOR APPROACH
Anesthesia: General | Site: Hip | Laterality: Left

## 2022-02-09 MED ORDER — ISOPROPYL ALCOHOL 70 % SOLN
Status: DC | PRN
  Administered 2022-02-09: 1 via TOPICAL

## 2022-02-09 MED ORDER — SODIUM CHLORIDE (PF) 0.9 % IJ SOLN
INTRAMUSCULAR | Status: AC
  Filled 2022-02-09: qty 30

## 2022-02-09 MED ORDER — METOCLOPRAMIDE HCL 5 MG/ML IJ SOLN: 5.0000 mg | Freq: Three times a day (TID) | INTRAMUSCULAR | Status: DC | PRN

## 2022-02-09 MED ORDER — ADULT MULTIVITAMIN W/MINERALS CH
1.0000 | ORAL_TABLET | Freq: Every day | ORAL | Status: DC
  Administered 2022-02-10 – 2022-02-15 (×6): 1 via ORAL
  Filled 2022-02-09 (×6): qty 1

## 2022-02-09 MED ORDER — ACETAMINOPHEN 325 MG PO TABS
325.0000 mg | ORAL_TABLET | Freq: Four times a day (QID) | ORAL | Status: DC | PRN
  Administered 2022-02-14: 650 mg via ORAL
  Filled 2022-02-09: qty 2

## 2022-02-09 MED ORDER — KETOROLAC TROMETHAMINE 30 MG/ML IJ SOLN
INTRAMUSCULAR | Status: DC | PRN
  Administered 2022-02-09: 30 mg

## 2022-02-09 MED ORDER — ONDANSETRON HCL 4 MG/2ML IJ SOLN
INTRAMUSCULAR | Status: DC | PRN
Start: 2022-02-09 — End: 2022-02-09
  Administered 2022-02-09: 4 mg via INTRAVENOUS

## 2022-02-09 MED ORDER — PHENOL 1.4 % MT LIQD: 1.0000 | OROMUCOSAL | Status: DC | PRN

## 2022-02-09 MED ORDER — METOPROLOL TARTRATE 25 MG PO TABS
25.0000 mg | ORAL_TABLET | Freq: Two times a day (BID) | ORAL | Status: DC
  Administered 2022-02-09 – 2022-02-15 (×11): 25 mg via ORAL
  Filled 2022-02-09 (×13): qty 1

## 2022-02-09 MED ORDER — FENTANYL CITRATE PF 50 MCG/ML IJ SOSY
12.5000 ug | PREFILLED_SYRINGE | INTRAMUSCULAR | Status: DC | PRN
  Administered 2022-02-09 (×3): 25 ug via INTRAVENOUS
  Filled 2022-02-09 (×3): qty 1

## 2022-02-09 MED ORDER — FENTANYL CITRATE (PF) 100 MCG/2ML IJ SOLN
INTRAMUSCULAR | Status: AC
Start: 2022-02-09 — End: ?
  Filled 2022-02-09: qty 2

## 2022-02-09 MED ORDER — ROPIVACAINE HCL 5 MG/ML IJ SOLN
INTRAMUSCULAR | Status: DC | PRN
  Administered 2022-02-09: 30 mL via PERINEURAL

## 2022-02-09 MED ORDER — AMITRIPTYLINE HCL 50 MG PO TABS
25.0000 mg | ORAL_TABLET | Freq: Every day | ORAL | Status: DC
  Filled 2022-02-09: qty 0.5

## 2022-02-09 MED ORDER — PANTOPRAZOLE SODIUM 40 MG PO TBEC
40.0000 mg | DELAYED_RELEASE_TABLET | Freq: Every day | ORAL | Status: DC
  Administered 2022-02-10 – 2022-02-15 (×6): 40 mg via ORAL
  Filled 2022-02-09 (×7): qty 1

## 2022-02-09 MED ORDER — MORPHINE SULFATE (PF) 2 MG/ML IV SOLN: 0.5000 mg | INTRAVENOUS | Status: DC | PRN

## 2022-02-09 MED ORDER — BUPIVACAINE-EPINEPHRINE 0.25% -1:200000 IJ SOLN
INTRAMUSCULAR | Status: DC | PRN
  Administered 2022-02-09: 30 mL

## 2022-02-09 MED ORDER — ROCURONIUM BROMIDE 10 MG/ML (PF) SYRINGE
PREFILLED_SYRINGE | INTRAVENOUS | Status: DC | PRN
  Administered 2022-02-09: 60 mg via INTRAVENOUS

## 2022-02-09 MED ORDER — AMITRIPTYLINE HCL 25 MG PO TABS
25.0000 mg | ORAL_TABLET | Freq: Every day | ORAL | Status: DC
  Administered 2022-02-09 – 2022-02-14 (×6): 25 mg via ORAL
  Filled 2022-02-09 (×7): qty 1

## 2022-02-09 MED ORDER — PHENYLEPHRINE HCL-NACL 20-0.9 MG/250ML-% IV SOLN
INTRAVENOUS | Status: DC | PRN
  Administered 2022-02-09: 25 ug/min via INTRAVENOUS

## 2022-02-09 MED ORDER — WATER FOR IRRIGATION, STERILE IR SOLN
Status: DC | PRN
  Administered 2022-02-09: 2000 mL

## 2022-02-09 MED ORDER — SUGAMMADEX SODIUM 200 MG/2ML IV SOLN
INTRAVENOUS | Status: DC | PRN
  Administered 2022-02-09: 200 mg via INTRAVENOUS

## 2022-02-09 MED ORDER — PROPOFOL 10 MG/ML IV BOLUS
INTRAVENOUS | Status: DC | PRN
  Administered 2022-02-09: 150 mg via INTRAVENOUS

## 2022-02-09 MED ORDER — FENTANYL CITRATE PF 50 MCG/ML IJ SOSY
PREFILLED_SYRINGE | INTRAMUSCULAR | Status: AC
  Administered 2022-02-09: 50 ug via INTRAVENOUS
  Filled 2022-02-09: qty 2

## 2022-02-09 MED ORDER — METOCLOPRAMIDE HCL 5 MG PO TABS: 5.0000 mg | ORAL_TABLET | Freq: Three times a day (TID) | ORAL | Status: DC | PRN

## 2022-02-09 MED ORDER — SENNA 8.6 MG PO TABS
1.0000 | ORAL_TABLET | Freq: Two times a day (BID) | ORAL | Status: DC
  Administered 2022-02-09 – 2022-02-15 (×12): 8.6 mg via ORAL
  Filled 2022-02-09 (×12): qty 1

## 2022-02-09 MED ORDER — HYDROCODONE-ACETAMINOPHEN 5-325 MG PO TABS
1.0000 | ORAL_TABLET | ORAL | Status: DC | PRN
  Administered 2022-02-10: 2 via ORAL
  Administered 2022-02-11: 1 via ORAL
  Administered 2022-02-11 (×2): 2 via ORAL
  Administered 2022-02-12 – 2022-02-13 (×3): 1 via ORAL
  Administered 2022-02-13: 2 via ORAL
  Administered 2022-02-14 – 2022-02-15 (×3): 1 via ORAL
  Filled 2022-02-09: qty 1
  Filled 2022-02-09: qty 2
  Filled 2022-02-09 (×2): qty 1
  Filled 2022-02-09: qty 2
  Filled 2022-02-09: qty 1
  Filled 2022-02-09: qty 2
  Filled 2022-02-09 (×2): qty 1
  Filled 2022-02-09: qty 2
  Filled 2022-02-09: qty 1
  Filled 2022-02-09: qty 2

## 2022-02-09 MED ORDER — KETOROLAC TROMETHAMINE 30 MG/ML IJ SOLN
INTRAMUSCULAR | Status: AC
  Filled 2022-02-09: qty 1

## 2022-02-09 MED ORDER — SODIUM CHLORIDE (PF) 0.9 % IJ SOLN
INTRAMUSCULAR | Status: DC | PRN
  Administered 2022-02-09: 30 mL

## 2022-02-09 MED ORDER — ONDANSETRON HCL 4 MG/2ML IJ SOLN
4.0000 mg | Freq: Four times a day (QID) | INTRAMUSCULAR | Status: DC | PRN
  Administered 2022-02-11: 4 mg via INTRAVENOUS
  Filled 2022-02-09: qty 2

## 2022-02-09 MED ORDER — LACTATED RINGERS IV SOLN: INTRAVENOUS | Status: DC

## 2022-02-09 MED ORDER — FENTANYL CITRATE PF 50 MCG/ML IJ SOSY: 25.0000 ug | PREFILLED_SYRINGE | INTRAMUSCULAR | Status: DC | PRN

## 2022-02-09 MED ORDER — BUPIVACAINE-EPINEPHRINE (PF) 0.25% -1:200000 IJ SOLN
INTRAMUSCULAR | Status: AC
  Filled 2022-02-09: qty 30

## 2022-02-09 MED ORDER — DEXAMETHASONE SODIUM PHOSPHATE 10 MG/ML IJ SOLN
INTRAMUSCULAR | Status: AC
  Filled 2022-02-09: qty 1

## 2022-02-09 MED ORDER — FENTANYL CITRATE (PF) 100 MCG/2ML IJ SOLN
INTRAMUSCULAR | Status: DC | PRN
  Administered 2022-02-09 (×2): 50 ug via INTRAVENOUS

## 2022-02-09 MED ORDER — METHOCARBAMOL 500 MG PO TABS
500.0000 mg | ORAL_TABLET | Freq: Four times a day (QID) | ORAL | Status: DC | PRN
  Administered 2022-02-11 – 2022-02-14 (×5): 500 mg via ORAL
  Filled 2022-02-09 (×5): qty 1

## 2022-02-09 MED ORDER — MENTHOL 3 MG MT LOZG: 1.0000 | LOZENGE | OROMUCOSAL | Status: DC | PRN

## 2022-02-09 MED ORDER — ENSURE ENLIVE PO LIQD
237.0000 mL | Freq: Two times a day (BID) | ORAL | Status: DC
  Administered 2022-02-09 – 2022-02-15 (×12): 237 mL via ORAL

## 2022-02-09 MED ORDER — METHOCARBAMOL 1000 MG/10ML IJ SOLN: 500.0000 mg | Freq: Four times a day (QID) | INTRAVENOUS | Status: DC | PRN

## 2022-02-09 MED ORDER — SODIUM CHLORIDE 0.9 % IV SOLN: INTRAVENOUS | Status: DC

## 2022-02-09 MED ORDER — HYDROCODONE-ACETAMINOPHEN 7.5-325 MG PO TABS
1.0000 | ORAL_TABLET | ORAL | Status: DC | PRN
  Administered 2022-02-12: 1 via ORAL
  Filled 2022-02-09: qty 1

## 2022-02-09 MED ORDER — MECLIZINE HCL 25 MG PO TABS
25.0000 mg | ORAL_TABLET | Freq: Four times a day (QID) | ORAL | Status: DC | PRN
  Administered 2022-02-15: 25 mg via ORAL
  Filled 2022-02-09: qty 1

## 2022-02-09 MED ORDER — CEFAZOLIN SODIUM-DEXTROSE 2-4 GM/100ML-% IV SOLN
2.0000 g | Freq: Four times a day (QID) | INTRAVENOUS | Status: AC
  Administered 2022-02-09 – 2022-02-10 (×2): 2 g via INTRAVENOUS
  Filled 2022-02-09 (×2): qty 100

## 2022-02-09 MED ORDER — ONDANSETRON HCL 4 MG/2ML IJ SOLN: 4.0000 mg | Freq: Four times a day (QID) | INTRAMUSCULAR | Status: DC | PRN

## 2022-02-09 MED ORDER — MIDAZOLAM HCL 2 MG/2ML IJ SOLN
INTRAMUSCULAR | Status: AC
  Filled 2022-02-09: qty 2

## 2022-02-09 MED ORDER — TRANEXAMIC ACID-NACL 1000-0.7 MG/100ML-% IV SOLN
1000.0000 mg | Freq: Once | INTRAVENOUS | Status: AC
  Administered 2022-02-09: 1000 mg via INTRAVENOUS
  Filled 2022-02-09: qty 100

## 2022-02-09 MED ORDER — DOCUSATE SODIUM 100 MG PO CAPS
100.0000 mg | ORAL_CAPSULE | Freq: Two times a day (BID) | ORAL | Status: DC
  Administered 2022-02-09 – 2022-02-15 (×12): 100 mg via ORAL
  Filled 2022-02-09 (×11): qty 1

## 2022-02-09 MED ORDER — SODIUM CHLORIDE 0.9 % IV SOLN
INTRAVENOUS | Status: DC
Start: 2022-02-09 — End: 2022-02-09

## 2022-02-09 MED ORDER — DEXAMETHASONE SODIUM PHOSPHATE 10 MG/ML IJ SOLN
INTRAMUSCULAR | Status: DC | PRN
  Administered 2022-02-09: 10 mg via INTRAVENOUS

## 2022-02-09 MED ORDER — SUCCINYLCHOLINE CHLORIDE 200 MG/10ML IV SOSY
PREFILLED_SYRINGE | INTRAVENOUS | Status: DC | PRN
  Administered 2022-02-09: 160 mg via INTRAVENOUS

## 2022-02-09 MED ORDER — LIDOCAINE 2% (20 MG/ML) 5 ML SYRINGE
INTRAMUSCULAR | Status: DC | PRN
  Administered 2022-02-09: 60 mg via INTRAVENOUS

## 2022-02-09 MED ORDER — ONDANSETRON HCL 4 MG/2ML IJ SOLN
INTRAMUSCULAR | Status: AC
  Filled 2022-02-09: qty 2

## 2022-02-09 MED ORDER — ASPIRIN 325 MG PO TBEC
325.0000 mg | DELAYED_RELEASE_TABLET | Freq: Every day | ORAL | Status: DC
  Administered 2022-02-10 – 2022-02-15 (×6): 325 mg via ORAL
  Filled 2022-02-09 (×6): qty 1

## 2022-02-09 MED ORDER — PHENYLEPHRINE HCL-NACL 20-0.9 MG/250ML-% IV SOLN
INTRAVENOUS | Status: AC
  Filled 2022-02-09: qty 250

## 2022-02-09 MED ORDER — SODIUM CHLORIDE 0.9 % IR SOLN
Status: DC | PRN
  Administered 2022-02-09: 1000 mL

## 2022-02-09 MED ORDER — ALBUMIN HUMAN 5 % IV SOLN: INTRAVENOUS | Status: DC | PRN

## 2022-02-09 MED ORDER — ROSUVASTATIN CALCIUM 10 MG PO TABS
10.0000 mg | ORAL_TABLET | Freq: Every day | ORAL | Status: DC
  Administered 2022-02-10 – 2022-02-15 (×6): 10 mg via ORAL
  Filled 2022-02-09 (×7): qty 1

## 2022-02-09 MED ORDER — ONDANSETRON HCL 4 MG PO TABS
4.0000 mg | ORAL_TABLET | Freq: Four times a day (QID) | ORAL | Status: DC | PRN
  Administered 2022-02-12 – 2022-02-14 (×2): 4 mg via ORAL
  Filled 2022-02-09 (×2): qty 1

## 2022-02-09 MED ORDER — 0.9 % SODIUM CHLORIDE (POUR BTL) OPTIME
TOPICAL | Status: DC | PRN
Start: 2022-02-09 — End: 2022-02-09
  Administered 2022-02-09: 1000 mL

## 2022-02-09 MED ORDER — SENNOSIDES-DOCUSATE SODIUM 8.6-50 MG PO TABS: 1.0000 | ORAL_TABLET | Freq: Every evening | ORAL | Status: DC | PRN

## 2022-02-09 SURGICAL SUPPLY — 61 items
BAG COUNTER SPONGE SURGICOUNT (BAG) ×1 IMPLANT
BAG DECANTER FOR FLEXI CONT (MISCELLANEOUS) IMPLANT
BAG ZIPLOCK 12X15 (MISCELLANEOUS) IMPLANT
CHLORAPREP W/TINT 26 (MISCELLANEOUS) ×2 IMPLANT
COVER PERINEAL POST (MISCELLANEOUS) ×2 IMPLANT
COVER SURGICAL LIGHT HANDLE (MISCELLANEOUS) ×2 IMPLANT
DERMABOND ADVANCED (GAUZE/BANDAGES/DRESSINGS) ×1
DERMABOND ADVANCED .7 DNX12 (GAUZE/BANDAGES/DRESSINGS) ×2 IMPLANT
DRAPE IMP U-DRAPE 54X76 (DRAPES) ×2 IMPLANT
DRAPE SHEET LG 3/4 BI-LAMINATE (DRAPES) ×6 IMPLANT
DRAPE STERI IOBAN 125X83 (DRAPES) ×2 IMPLANT
DRAPE U-SHAPE 47X51 STRL (DRAPES) ×4 IMPLANT
DRSG AQUACEL AG ADV 3.5X10 (GAUZE/BANDAGES/DRESSINGS) ×2 IMPLANT
ELECT REM PT RETURN 15FT ADLT (MISCELLANEOUS) ×2 IMPLANT
GAUZE SPONGE 4X4 12PLY STRL (GAUZE/BANDAGES/DRESSINGS) ×2 IMPLANT
GLOVE BIO SURGEON STRL SZ8.5 (GLOVE) ×4 IMPLANT
GLOVE BIOGEL M 7.0 STRL (GLOVE) ×2 IMPLANT
GLOVE BIOGEL PI IND STRL 7.5 (GLOVE) ×1 IMPLANT
GLOVE BIOGEL PI IND STRL 8 (GLOVE) ×1 IMPLANT
GLOVE BIOGEL PI IND STRL 8.5 (GLOVE) ×1 IMPLANT
GLOVE BIOGEL PI INDICATOR 7.5 (GLOVE) ×1
GLOVE BIOGEL PI INDICATOR 8 (GLOVE) ×1
GLOVE BIOGEL PI INDICATOR 8.5 (GLOVE) ×1
GLOVE SURG LX 7.5 STRW (GLOVE) ×2
GLOVE SURG LX STRL 7.5 STRW (GLOVE) ×2 IMPLANT
GOWN SPEC L3 XXLG W/TWL (GOWN DISPOSABLE) ×4 IMPLANT
HANDPIECE INTERPULSE COAX TIP (DISPOSABLE) ×2
HEAD FEM -3XOFST 36XMDLR (Head) IMPLANT
HEAD MODULAR 36MM (Head) ×2 IMPLANT
HOLDER FOLEY CATH W/STRAP (MISCELLANEOUS) ×2 IMPLANT
HOOD PEEL AWAY FLYTE STAYCOOL (MISCELLANEOUS) ×6 IMPLANT
KIT TURNOVER KIT A (KITS) ×1 IMPLANT
LINER ACETAB G7 NEUTRAL H 36 (Liner) ×1 IMPLANT
MANIFOLD NEPTUNE II (INSTRUMENTS) ×2 IMPLANT
MARKER SKIN DUAL TIP RULER LAB (MISCELLANEOUS) ×2 IMPLANT
NDL SAFETY ECLIPSE 18X1.5 (NEEDLE) ×1 IMPLANT
NDL SPNL 18GX3.5 QUINCKE PK (NEEDLE) ×1 IMPLANT
NEEDLE HYPO 18GX1.5 SHARP (NEEDLE) ×2
NEEDLE SPNL 18GX3.5 QUINCKE PK (NEEDLE) ×2 IMPLANT
PACK ANTERIOR HIP CUSTOM (KITS) ×2 IMPLANT
PENCIL SMOKE EVACUATOR (MISCELLANEOUS) IMPLANT
SAW OSC TIP CART 19.5X105X1.3 (SAW) ×2 IMPLANT
SEALER BIPOLAR AQUA 6.0 (INSTRUMENTS) ×2 IMPLANT
SET HNDPC FAN SPRY TIP SCT (DISPOSABLE) ×1 IMPLANT
SHELL ACET G7 4H 62 SZH (Shell) ×1 IMPLANT
SOLUTION PRONTOSAN WOUND 350ML (IRRIGATION / IRRIGATOR) ×2 IMPLANT
SPIKE FLUID TRANSFER (MISCELLANEOUS) ×2 IMPLANT
STAPLER INSORB 30 2030 C-SECTI (MISCELLANEOUS) IMPLANT
STAPLER VISISTAT 35W (STAPLE) ×1 IMPLANT
STEM FEM DIST PS 20X160 T1 (Stem) ×1 IMPLANT
SUT MNCRL AB 3-0 PS2 18 (SUTURE) ×2 IMPLANT
SUT MON AB 2-0 CT1 36 (SUTURE) ×2 IMPLANT
SUT STRATAFIX PDO 1 14 VIOLET (SUTURE) ×2
SUT STRATFX PDO 1 14 VIOLET (SUTURE) ×1
SUT VIC AB 2-0 CT1 27 (SUTURE)
SUT VIC AB 2-0 CT1 TAPERPNT 27 (SUTURE) IMPLANT
SUTURE STRATFX PDO 1 14 VIOLET (SUTURE) ×1 IMPLANT
SYR 3ML LL SCALE MARK (SYRINGE) ×2 IMPLANT
TRAY FOLEY MTR SLVR 16FR STAT (SET/KITS/TRAYS/PACK) IMPLANT
TUBE SUCTION HIGH CAP CLEAR NV (SUCTIONS) ×2 IMPLANT
WATER STERILE IRR 1000ML POUR (IV SOLUTION) ×2 IMPLANT

## 2022-02-09 NOTE — H&P (Addendum)
History and Physical    Trevor Ward MGQ:[id number] DOB: 1941/01/15 DOA: 02/08/2022  PCP: Mayra Neer, MD   Patient coming from: Home   Chief Complaint: left hip pain after a fall   HPI: Trevor Ward is a pleasant 81 y.o. male with medical history significant for CAD status post CABG, PAD, hypertension, and thoracic aortic aneurysm, now presenting to the emergency department with left hip pain after a fall.  Patient reports increased difficulty with balance over the past 2 to 3 months.  He had otherwise been in his usual state today when he fell in his driveway.  He denies hitting his head or losing consciousness but was having severe pain in the left hip and was unable to bear weight.  ED Course: Upon arrival to the ED, patient is found to be afebrile and saturating mid 90s on room air with stable blood pressure.  EKG features sinus rhythm with LVH.  Chest x-ray with mild bibasilar atelectasis and calcified pleural plaques.  Head CT and cervical spine CT are negative for acute findings.  Plain radiographs of the the left hip demonstrate comminuted left femoral neck fracture.  Potassium and total bilirubin were mildly elevated and an initial high-sensitivity troponin was elevated to 50, though was only 4 when repeated three hours later.  Orthopedic surgery was consulted by the ED physician.  Review of Systems:  All other systems reviewed and apart from HPI, are negative.  Past Medical History:  Diagnosis Date   Arthritis    "fingers" (02/01/2016)   Depression    Diabetes mellitus without complication (HCC)    GERD (gastroesophageal reflux disease)    High cholesterol    "can't take RX; made my legs weak" (02/01/2016)   History of gout    History of hiatal hernia    Hypertension    Peripheral vascular disease ([place])    carotid artery stenosis   Skin cancer of face     "the good kind"   Stroke [facility]) 2016   denies residual on 02/01/2016    Past Surgical History:  Procedure  Laterality Date   CARDIAC CATHETERIZATION N/A 02/01/2016   Procedure: Left Heart Cath and Coronary Angiography;  Surgeon: Burnell Blanks, MD;  Location: [place] [facility];  Service: Cardiovascular;  Laterality: N/A;   COLONOSCOPY     CORONARY ARTERY BYPASS GRAFT N/A 02/02/2016   Procedure: CORONARY ARTERY BYPASS GRAFTING (CABG) times 4 using left internal mammary and left saphenous ven harvested by endoven ;  Surgeon: Grace Isaac, MD;  Location: [place];  Service: Open Heart Surgery;  Laterality: N/A;   ENDARTERECTOMY Right 04/21/2015   Procedure: RIGHT CAROTID ENDARTERECTOMY;  Surgeon: Elam Dutch, MD;  Location: [place];  Service: Vascular;  Laterality: Right;   ESOPHAGOGASTRODUODENOSCOPY (EGD) WITH PROPOFOL N/A 01/03/2016   Procedure: ESOPHAGOGASTRODUODENOSCOPY (EGD) WITH PROPOFOL;  Surgeon: Garlan Fair, MD;  Location: [facility];  Service: Endoscopy;  Laterality: N/A;   KNEE ARTHROSCOPY Right    open heart surgery  01/2016   PERIPHERAL VASCULAR CATHETERIZATION Bilateral 04/14/2015   Procedure: Carotid Angiography;  Surgeon: Serafina Mitchell, MD;  Location: [place] [facility];  Service: Cardiovascular;  Laterality: Bilateral;   SKIN CANCER EXCISION Right    "temple"   TEE WITHOUT CARDIOVERSION N/A 02/02/2016   Procedure: TRANSESOPHAGEAL ECHOCARDIOGRAM (TEE);  Surgeon: Grace Isaac, MD;  Location: [place];  Service: Open Heart Surgery;  Laterality: N/A;    Social History:   reports that he has  quit smoking. His smoking use included cigarettes. He started smoking about 66 years ago. He has never used smokeless tobacco. He reports that he does not drink alcohol and does not use drugs.  Allergies  Allergen Reactions   Statins Other (See Comments)    Severe myalgias    Family History  Problem Relation Age of Onset   CAD Mother    CVA Mother    Diabetes Mother    Breast cancer Mother    Parkinson's disease Mother    Arthritis Mother    Heart attack Father    CVA  Sister    CAD Brother    Diabetes Brother    CVA Brother    CAD Brother    Diabetes Brother      Prior to Admission medications   Medication Sig Start Date End Date Taking? Authorizing Provider  allopurinol (ZYLOPRIM) 100 MG tablet Take 100 mg by mouth daily.  01/13/16  Yes [provider]  amitriptyline (ELAVIL) 25 MG tablet Take 25 mg by mouth at bedtime. 04/19/19  Yes [provider]  aspirin EC 81 MG EC tablet Take 1 tablet (81 mg total) by mouth daily. 02/10/16  Yes Barrett, Lodema Hong, PA-C  buPROPion (WELLBUTRIN XL) 300 MG 24 hr tablet  05/19/19  Yes [provider]  colchicine 0.6 MG tablet  03/27/16  Yes [provider]  Glucosamine Sulfate 1000 MG TABS Take 2,000 mg by mouth daily.   Yes [provider]  meclizine (ANTIVERT) 25 MG tablet Take 25 mg by mouth 4 (four) times daily as needed for dizziness. 02/05/18  Yes [provider]  metoprolol tartrate (LOPRESSOR) 25 MG tablet TAKE 1 TABLET TWO TIMES DAILY. MAKE OFFICE VISIT FOR MORE REFILLS. Patient taking differently: Take 25 mg by mouth 2 (two) times daily. 12/26/21  Yes Croitoru, Mihai, MD  pantoprazole (PROTONIX) 40 MG tablet Take 1 tablet (40 mg total) by mouth daily. 01/01/17  Yes Almyra Deforest, PA  rosuvastatin (CRESTOR) 10 MG tablet Take 10 mg by mouth daily. 12/01/20  Yes [provider]  vitamin B-12 (CYANOCOBALAMIN) 500 MCG tablet Take 500 mcg by mouth daily. Patient not taking: Reported on 02/08/2022    [provider]    Physical Exam: Vitals:   02/08/22 1945 02/08/22 2000 02/08/22 2200 02/08/22 2300  BP: (!) 163/78 (!) 156/78  126/86  Pulse: (!) 102 (!) 102  (!) 52  Resp: 17 (!) 24  17  Temp:      TempSrc:      SpO2: 96% 96% 92% 95%  Weight:      Height:        Constitutional: NAD, calm  Eyes: PERTLA, lids and conjunctivae normal ENMT: Mucous membranes are moist. Posterior pharynx clear of any exudate or lesions.   Neck: supple, no masses   Respiratory: no wheezing, no crackles. No accessory muscle use.  Cardiovascular: S1 & S2 heard, regular rate and rhythm. No extremity edema.   Abdomen: No distension, no tenderness, soft. Bowel sounds active.  Musculoskeletal: no clubbing / cyanosis. Left hip tenderness, neurovascularly intact.    Skin: no significant rashes, lesions, ulcers. Warm, dry, well-perfused. Neurologic: CN II-XII grossly intact. Moving all extremities. Alert and oriented to person, place, and situation.  Psychiatric: Pleasant. Cooperative.    Labs and Imaging on Admission: I have personally reviewed following labs and imaging studies  CBC: Recent Labs  Lab 02/08/22 1759  WBC 12.8*  NEUTROABS 10.8*  HGB 17.3*  HCT 51.7  MCV  89.1  PLT 280   Basic Metabolic Panel: Recent Labs  Lab 02/08/22 1759  NA 138  K 5.3*  CL 104  CO2 25  GLUCOSE 115*  BUN 18  CREATININE 1.24  CALCIUM 9.2   GFR: Estimated Creatinine Clearance: 49.4 mL/min (by C-G formula based on SCr of 1.24 mg/dL). Liver Function Tests: Recent Labs  Lab 02/08/22 1759  AST 32  ALT 17  ALKPHOS 67  BILITOT 1.8*  PROT 8.3*  ALBUMIN 4.4   No results for input(s): LIPASE, AMYLASE in the last 168 hours. No results for input(s): AMMONIA in the last 168 hours. Coagulation Profile: No results for input(s): INR, PROTIME in the last 168 hours. Cardiac Enzymes: No results for input(s): CKTOTAL, CKMB, CKMBINDEX, TROPONINI in the last 168 hours. BNP (last 3 results) No results for input(s): PROBNP in the last 8760 hours. HbA1C: No results for input(s): HGBA1C in the last 72 hours. CBG: No results for input(s): GLUCAP in the last 168 hours. Lipid Profile: No results for input(s): CHOL, HDL, LDLCALC, TRIG, CHOLHDL, LDLDIRECT in the last 72 hours. Thyroid Function Tests: No results for input(s): TSH, T4TOTAL, FREET4, T3FREE, THYROIDAB in the last 72 hours. Anemia Panel: No results for input(s): VITAMINB12, FOLATE, FERRITIN, TIBC, IRON,  RETICCTPCT in the last 72 hours. Urine analysis:    Component Value Date/Time   COLORURINE YELLOW 02/01/2016 2217   APPEARANCEUR CLEAR 02/01/2016 2217   LABSPEC 1.013 02/01/2016 2217   PHURINE 6.5 02/01/2016 2217   GLUCOSEU NEGATIVE 02/01/2016 2217   HGBUR NEGATIVE 02/01/2016 2217   [place] NEGATIVE 02/01/2016 2217   [place] NEGATIVE 02/01/2016 2217   PROTEINUR NEGATIVE 02/01/2016 2217   UROBILINOGEN 0.2 04/11/2015 1711   NITRITE NEGATIVE 02/01/2016 2217   LEUKOCYTESUR NEGATIVE 02/01/2016 2217   Sepsis Labs: '@LABRCNTIP'$ (procalcitonin:4,lacticidven:4) )No results found for this or any previous visit (from the past 240 hour(s)).   Radiological Exams on Admission: CT Head Wo Contrast  Result Date: 02/08/2022 CLINICAL DATA:  Fall. EXAM: CT HEAD WITHOUT CONTRAST CT CERVICAL SPINE WITHOUT CONTRAST TECHNIQUE: Multidetector CT imaging of the head and cervical spine was performed following the standard protocol without intravenous contrast. Multiplanar CT image reconstructions of the cervical spine were also generated. RADIATION DOSE REDUCTION: This exam was performed according to the departmental dose-optimization program which includes automated exposure control, adjustment of the mA and/or kV according to patient size and/or use of iterative reconstruction technique. COMPARISON:  CT head 04/11/2015 FINDINGS: CT HEAD FINDINGS Brain: Negative for acute infarct, hemorrhage, mass. Progressive atrophy and progressive chronic microvascular ischemic change in the white matter compared with 2016. Vascular: Negative for hyperdense vessel. Skull: Negative Sinuses/Orbits: Paranasal sinuses clear.  No orbital lesion. Other: None CT CERVICAL SPINE FINDINGS Alignment: Mild anterolisthesis C3-4. Straightening of the cervical lordosis. Skull base and vertebrae: Negative for fracture Soft tissues and spinal canal: No acute soft tissue abnormality. Disc levels: Multilevel disc and facet degeneration. Disc  degeneration most prominent at C4-5, C5-6, C6-7. Moderate left foraminal narrowing C5-6 and C6-7 due to spurring. Upper chest: Lung apices clear bilaterally Other: None IMPRESSION: 1. No acute intracranial abnormality progressive atrophy and chronic microvascular ischemic change since 2016 2. Negative for cervical spine fracture. Moderate cervical spondylosis. Electronically Signed   By: Franchot Gallo M.D.   On: 02/08/2022 18:42   CT Cervical Spine Wo Contrast  Result Date: 02/08/2022 CLINICAL DATA:  Fall. EXAM: CT HEAD WITHOUT CONTRAST CT CERVICAL SPINE WITHOUT CONTRAST TECHNIQUE: Multidetector CT imaging of the head and cervical spine was performed  following the standard protocol without intravenous contrast. Multiplanar CT image reconstructions of the cervical spine were also generated. RADIATION DOSE REDUCTION: This exam was performed according to the departmental dose-optimization program which includes automated exposure control, adjustment of the mA and/or kV according to patient size and/or use of iterative reconstruction technique. COMPARISON:  CT head 04/11/2015 FINDINGS: CT HEAD FINDINGS Brain: Negative for acute infarct, hemorrhage, mass. Progressive atrophy and progressive chronic microvascular ischemic change in the white matter compared with 2016. Vascular: Negative for hyperdense vessel. Skull: Negative Sinuses/Orbits: Paranasal sinuses clear.  No orbital lesion. Other: None CT CERVICAL SPINE FINDINGS Alignment: Mild anterolisthesis C3-4. Straightening of the cervical lordosis. Skull base and vertebrae: Negative for fracture Soft tissues and spinal canal: No acute soft tissue abnormality. Disc levels: Multilevel disc and facet degeneration. Disc degeneration most prominent at C4-5, C5-6, C6-7. Moderate left foraminal narrowing C5-6 and C6-7 due to spurring. Upper chest: Lung apices clear bilaterally Other: None IMPRESSION: 1. No acute intracranial abnormality progressive atrophy and chronic  microvascular ischemic change since 2016 2. Negative for cervical spine fracture. Moderate cervical spondylosis. Electronically Signed   By: Franchot Gallo M.D.   On: 02/08/2022 18:42   DG Chest Portable 1 View  Result Date: 02/08/2022 CLINICAL DATA:  Hip fracture EXAM: PORTABLE CHEST 1 VIEW COMPARISON:  Chest x-ray 10/17/2021.  Chest CT 07/18/2021 FINDINGS: Postop CABG. Heart size within normal limits. Negative for heart failure. Mild bibasilar airspace disease likely atelectasis.  No effusion. Bilateral calcified pleural plaques. IMPRESSION: Mild bibasilar atelectasis. Calcified pleural plaques bilaterally. Electronically Signed   By: Franchot Gallo M.D.   On: 02/08/2022 18:15   DG Hip Unilat With Pelvis 2-3 Views Left  Result Date: 02/08/2022 CLINICAL DATA:  Trauma, fall EXAM: DG HIP (WITH OR WITHOUT PELVIS) 2-3V LEFT COMPARISON:  None Available. FINDINGS: There is comminuted fracture in the neck of left femur. There is no significant displacement or angulation at the fracture site. Degenerative changes are noted in the visualized lumbar spine. Vascular calcifications are seen in the soft tissues. Surgical clips are seen in the left upper thigh. IMPRESSION: Comminuted, essentially undisplaced fracture is seen in the neck of left femur. Electronically Signed   By: Elmer Picker M.D.   On: 02/08/2022 16:59    EKG: Independently reviewed. Sinus rhythm, LVH.   Assessment/Plan   1. Left hip fracture  - Presents with left hip pain after a fall at home and found to have left femoral neck fracture   - Appreciate orthopedic surgery consultation  - Based on the available data, Trevor Ward presents an estimated 1% risk of perioperative MI or cardiac arrest  - Hold ASA, keep NPO after midnight, continue pain-control    2. CAD; elevated troponin  - Hx of triple-vessel CAD s/p CABG in 2017  - Had troponin of 50 in ED, then 4 three hours later; he has no anginal complaints   - Hold ASA  preoperatively, continue statin and beta-blocker    3. Hyperkalemia  - Serum potassium 5.3 on admission  - Start gentle IVF hydration and repeat chem panel in am   4. Thoracic aortic aneurysm  - Asymptomatic, was 39 mm in October 2022    DVT prophylaxis: SCDs Code Status: Full  Level of Care: Level of care: Telemetry Family Communication: none available on admission   Disposition Plan:  Patient is from: Home  Anticipated d/c is to: SNF  Anticipated d/c date is: 02/12/22  Patient currently: Pending hip repair  Consults called: Orthopedic  surgery  Admission status: Inpatient     Vianne Bulls, MD [facility]  02/09/2022, 12:13 AM

## 2022-02-09 NOTE — Discharge Instructions (Signed)
? ?Dr. Whitten Andreoni ?Joint Replacement Specialist ?[facility] ?[address] ?[place], [place] [address] ?[phone] ? ? ?TOTAL HIP REPLACEMENT POSTOPERATIVE DIRECTIONS ? ? ? ?Hip Rehabilitation, Guidelines Following Surgery  ? ?WEIGHT BEARING ?Weight bearing as tolerated with assist device (walker, cane, etc) as directed, use it as long as suggested by your surgeon or therapist, typically at least 4-6 weeks. ? ?The results of a hip operation are greatly improved after range of motion and muscle strengthening exercises. Follow all safety measures which are given to protect your hip. If any of these exercises cause increased pain or swelling in your joint, decrease the amount until you are comfortable again. Then slowly increase the exercises. Call your caregiver if you have problems or questions.  ? ?HOME CARE INSTRUCTIONS  ?Most of the following instructions are designed to prevent the dislocation of your new hip.  ?Remove items at home which could result in a fall. This includes throw rugs or furniture in walking pathways.  ?Continue medications as instructed at time of discharge. ?You may have some home medications which will be placed on hold until you complete the course of blood thinner medication. ?You may start showering once you are discharged home. Do not remove your dressing. ?Do not put on socks or shoes without following the instructions of your caregivers.   ?Sit on chairs with arms. Use the chair arms to help push yourself up when arising.  ?Arrange for the use of a toilet seat elevator so you are not sitting low.  ?Walk with walker as instructed.  ?You may resume a sexual relationship in one month or when given the OK by your caregiver.  ?Use walker as long as suggested by your caregivers.  ?You may put full weight on your legs and walk as much as is comfortable. ?Avoid periods of inactivity such as sitting longer than an hour when not asleep. This helps prevent blood  clots.  ?You may return to work once you are cleared by your surgeon.  ?Do not drive a car for 6 weeks or until released by your surgeon.  ?Do not drive while taking narcotics.  ?Wear elastic stockings for two weeks following surgery during the day but you may remove then at night.  ?Make sure you keep all of your appointments after your operation with all of your doctors and caregivers. You should call the office at the above phone number and make an appointment for approximately two weeks after the date of your surgery. ?Please pick up a stool softener and laxative for home use as long as you are requiring pain medications. ?ICE to the affected hip every three hours for 30 minutes at a time and then as needed for pain and swelling. Continue to use ice on the hip for pain and swelling from surgery. You may notice swelling that will progress down to the foot and ankle.  This is normal after surgery.  Elevate the leg when you are not up walking on it.   ?It is important for you to complete the blood thinner medication as prescribed by your doctor. ?Continue to use the breathing machine which will help keep your temperature down.  It is common for your temperature to cycle up and down following surgery, especially at night when you are not up moving around and exerting yourself.  The breathing machine keeps your lungs expanded and your temperature down. ? ?RANGE OF MOTION AND STRENGTHENING EXERCISES  ?These exercises are designed to help you   keep full movement of your hip joint. Follow your caregiver's or physical therapist's instructions. Perform all exercises about fifteen times, three times per day or as directed. Exercise both hips, even if you have had only one joint replacement. These exercises can be done on a training (exercise) mat, on the floor, on a table or on a bed. Use whatever works the best and is most comfortable for you. Use music or television while you are exercising so that the exercises are a  pleasant break in your day. This will make your life better with the exercises acting as a break in routine you can look forward to.  ?Lying on your back, slowly slide your foot toward your buttocks, raising your knee up off the floor. Then slowly slide your foot back down until your leg is straight again.  ?Lying on your back spread your legs as far apart as you can without causing discomfort.  ?Lying on your side, raise your upper leg and foot straight up from the floor as far as is comfortable. Slowly lower the leg and repeat.  ?Lying on your back, tighten up the muscle in the front of your thigh (quadriceps muscles). You can do this by keeping your leg straight and trying to raise your heel off the floor. This helps strengthen the largest muscle supporting your knee.  ?Lying on your back, tighten up the muscles of your buttocks both with the legs straight and with the knee bent at a comfortable angle while keeping your heel on the floor.  ? ?SKILLED REHAB INSTRUCTIONS: ?If the patient is transferred to a skilled rehab facility following release from the hospital, a list of the current medications will be sent to the facility for the patient to continue.  When discharged from the skilled rehab facility, please have the facility set up the patient's Home Health Physical Therapy prior to being released. Also, the skilled facility will be responsible for providing the patient with their medications at time of release from the facility to include their pain medication and their blood thinner medication. If the patient is still at the rehab facility at time of the two week follow up appointment, the skilled rehab facility will also need to assist the patient in arranging follow up appointment in our office and any transportation needs. ? ?POST-OPERATIVE OPIOID TAPER INSTRUCTIONS: ?It is important to wean off of your opioid medication as soon as possible. If you do not need pain medication after your surgery it is ok  to stop day one. ?Opioids include: ?Codeine, Hydrocodone(Norco, Vicodin), Oxycodone(Percocet, oxycontin) and hydromorphone amongst others.  ?Long term and even short term use of opiods can cause: ?Increased pain response ?Dependence ?Constipation ?Depression ?Respiratory depression ?And more.  ?Withdrawal symptoms can include ?Flu like symptoms ?Nausea, vomiting ?And more ?Techniques to manage these symptoms ?Hydrate well ?Eat regular healthy meals ?Stay active ?Use relaxation techniques(deep breathing, meditating, yoga) ?Do Not substitute Alcohol to help with tapering ?If you have been on opioids for less than two weeks and do not have pain than it is ok to stop all together.  ?Plan to wean off of opioids ?This plan should start within one week post op of your joint replacement. ?Maintain the same interval or time between taking each dose and first decrease the dose.  ?Cut the total daily intake of opioids by one tablet each day ?Next start to increase the time between doses. ?The last dose that should be eliminated is the evening dose.  ? ? ?MAKE   SURE YOU:  ?Understand these instructions.  ?Will watch your condition.  ?Will get help right away if you are not doing well or get worse. ? ?Pick up stool softner and laxative for home use following surgery while on pain medications. ?Do not remove your dressing. ?The dressing is waterproof--it is OK to take showers. ?Continue to use ice for pain and swelling after surgery. ?Do not use any lotions or creams on the incision until instructed by your surgeon. ?Total Hip Protocol. ? ?

## 2022-02-09 NOTE — Anesthesia Procedure Notes (Signed)
Anesthesia Regional Block: Femoral nerve block   Pre-Anesthetic Checklist: , timeout performed,  Correct Patient, Correct Site, Correct Laterality,  Correct Procedure, Correct Position, site marked,  Risks and benefits discussed,  Surgical consent,  Pre-op evaluation,  At surgeon's request and post-op pain management  Laterality: Lower and Left  Prep: chloraprep       Needles:  Injection technique: Single-shot  Needle Type: Echogenic Needle     Needle Length: 5cm  Needle Gauge: 22     Additional Needles:   Procedures:,,,, ultrasound used (permanent image in chart),,    Narrative:  Start time: 02/09/2022 1:34 PM End time: 02/09/2022 1:41 PM Injection made incrementally with aspirations every 5 mL.  Performed by: Personally  Anesthesiologist: Barnet Glasgow, MD  Additional Notes: . The patient tolerated the procedure well.

## 2022-02-09 NOTE — Progress Notes (Signed)
PROGRESS NOTE    Trevor Ward  ZTI:[id number] DOB: [birth year] DOA: 02/08/2022 PCP: Mayra Neer, MD    Brief Narrative:  81 year old with history of coronary artery disease status post CABG, peripheral artery disease, hypertension and thoracic aortic aneurysm presented with mechanical fall and left hip pain and found to have left hip fracture.  In the emergency room hemodynamically stable.  Skeletal survey negative except left hip fracture.  Admitted for surgical fixation.   Assessment & Plan:   Closed traumatic fracture left hip: NPO.  IV fluid.  Adequate pain medications.  Scheduled for total hip arthroplasty today.  Coronary artery disease: Stable.  Initially mildly elevated troponins.  Holding aspirin.  On a statin and beta-blockers.  No ongoing chest pain.  Elevated bilirubin: Probably Gilbert's syndrome.  Benign.  No indication to intervene.   DVT prophylaxis: SCDs Start: 02/09/22 0002   Code Status: Full code Family Communication: Wife at the bedside Disposition Plan: Status is: Inpatient Remains inpatient appropriate because: Inpatient surgical plan.     Consultants:  Orthopedics  Procedures:  None  Antimicrobials:  Perioperative   Subjective: Patient seen and examined.  Wife at the bedside.  Denies any complaints.  Patient was asking me when he can go back to his "Saturday dancing sessions".  I told him maybe 2 weeks.  Waiting for surgery.  Had some nausea after pain medications.  Pain is excruciating on any mobility.  Objective: Vitals:   02/09/22 0744 02/09/22 0930 02/09/22 0945 02/09/22 1019  BP: 140/80 133/73  (!) 159/84  Pulse: (!) 104 (!) 103 (!) 104 (!) 102  Resp: 18 (!) 21 (!) 22 18  Temp:   98.2 F (36.8 C) 99.4 F (37.4 C)  TempSrc:   Oral   SpO2: 95% 98% 92% 96%  Weight:      Height:        Intake/Output Summary (Last 24 hours) at 02/09/2022 1249 Last data filed at 02/09/2022 1028 Gross per 24 hour  Intake 672.5 ml  Output --   Net 672.5 ml   Filed Weights   02/08/22 1407  Weight: 90.7 kg    Examination:  General exam: Appears calm and comfortable at rest. Moderate distress on attempted mobility and movement. Respiratory system: No added sounds. Cardiovascular system: S1 & S2 heard, RRR.  Gastrointestinal system: Soft.  Nontender.  Bowel sound present.   Central nervous system: Alert and oriented. No focal neurological deficits. Extremities: Symmetric 5 x 5 power. Painful at left hip, range of motion not done.  Distal neurovascular status intact.    Data Reviewed: I have personally reviewed following labs and imaging studies  CBC: Recent Labs  Lab 02/08/22 1759 02/09/22 0700  WBC 12.8* 12.0*  NEUTROABS 10.8* 10.3*  HGB 17.3* 16.4  HCT 51.7 46.8  MCV 89.1 87.5  PLT 211 825   Basic Metabolic Panel: Recent Labs  Lab 02/08/22 1759 02/09/22 0700  NA 138 134*  K 5.3* 3.9  CL 104 104  CO2 25 21*  GLUCOSE 115* 165*  BUN 18 22  CREATININE 1.24 1.08  CALCIUM 9.2 8.5*   GFR: Estimated Creatinine Clearance: 56.7 mL/min (by C-G formula based on SCr of 1.08 mg/dL). Liver Function Tests: Recent Labs  Lab 02/08/22 1759 02/09/22 0700  AST 32 16  ALT 17 13  ALKPHOS 67 58  BILITOT 1.8* 1.8*  PROT 8.3* 7.5  ALBUMIN 4.4 3.9   No results for input(s): LIPASE, AMYLASE in the last 168 hours. No results for input(s): AMMONIA  in the last 168 hours. Coagulation Profile: No results for input(s): INR, PROTIME in the last 168 hours. Cardiac Enzymes: No results for input(s): CKTOTAL, CKMB, CKMBINDEX, TROPONINI in the last 168 hours. BNP (last 3 results) No results for input(s): PROBNP in the last 8760 hours. HbA1C: No results for input(s): HGBA1C in the last 72 hours. CBG: No results for input(s): GLUCAP in the last 168 hours. Lipid Profile: No results for input(s): CHOL, HDL, LDLCALC, TRIG, CHOLHDL, LDLDIRECT in the last 72 hours. Thyroid Function Tests: No results for input(s): TSH,  T4TOTAL, FREET4, T3FREE, THYROIDAB in the last 72 hours. Anemia Panel: No results for input(s): VITAMINB12, FOLATE, FERRITIN, TIBC, IRON, RETICCTPCT in the last 72 hours. Sepsis Labs: No results for input(s): PROCALCITON, LATICACIDVEN in the last 168 hours.  No results found for this or any previous visit (from the past 240 hour(s)).       Radiology Studies: CT Head Wo Contrast  Result Date: 02/08/2022 CLINICAL DATA:  Fall. EXAM: CT HEAD WITHOUT CONTRAST CT CERVICAL SPINE WITHOUT CONTRAST TECHNIQUE: Multidetector CT imaging of the head and cervical spine was performed following the standard protocol without intravenous contrast. Multiplanar CT image reconstructions of the cervical spine were also generated. RADIATION DOSE REDUCTION: This exam was performed according to the departmental dose-optimization program which includes automated exposure control, adjustment of the mA and/or kV according to patient size and/or use of iterative reconstruction technique. COMPARISON:  CT head 04/11/2015 FINDINGS: CT HEAD FINDINGS Brain: Negative for acute infarct, hemorrhage, mass. Progressive atrophy and progressive chronic microvascular ischemic change in the white matter compared with 2016. Vascular: Negative for hyperdense vessel. Skull: Negative Sinuses/Orbits: Paranasal sinuses clear.  No orbital lesion. Other: None CT CERVICAL SPINE FINDINGS Alignment: Mild anterolisthesis C3-4. Straightening of the cervical lordosis. Skull base and vertebrae: Negative for fracture Soft tissues and spinal canal: No acute soft tissue abnormality. Disc levels: Multilevel disc and facet degeneration. Disc degeneration most prominent at C4-5, C5-6, C6-7. Moderate left foraminal narrowing C5-6 and C6-7 due to spurring. Upper chest: Lung apices clear bilaterally Other: None IMPRESSION: 1. No acute intracranial abnormality progressive atrophy and chronic microvascular ischemic change since 2016 2. Negative for cervical spine  fracture. Moderate cervical spondylosis. Electronically Signed   By: Franchot Gallo M.D.   On: 02/08/2022 18:42   CT Cervical Spine Wo Contrast  Result Date: 02/08/2022 CLINICAL DATA:  Fall. EXAM: CT HEAD WITHOUT CONTRAST CT CERVICAL SPINE WITHOUT CONTRAST TECHNIQUE: Multidetector CT imaging of the head and cervical spine was performed following the standard protocol without intravenous contrast. Multiplanar CT image reconstructions of the cervical spine were also generated. RADIATION DOSE REDUCTION: This exam was performed according to the departmental dose-optimization program which includes automated exposure control, adjustment of the mA and/or kV according to patient size and/or use of iterative reconstruction technique. COMPARISON:  CT head 04/11/2015 FINDINGS: CT HEAD FINDINGS Brain: Negative for acute infarct, hemorrhage, mass. Progressive atrophy and progressive chronic microvascular ischemic change in the white matter compared with 2016. Vascular: Negative for hyperdense vessel. Skull: Negative Sinuses/Orbits: Paranasal sinuses clear.  No orbital lesion. Other: None CT CERVICAL SPINE FINDINGS Alignment: Mild anterolisthesis C3-4. Straightening of the cervical lordosis. Skull base and vertebrae: Negative for fracture Soft tissues and spinal canal: No acute soft tissue abnormality. Disc levels: Multilevel disc and facet degeneration. Disc degeneration most prominent at C4-5, C5-6, C6-7. Moderate left foraminal narrowing C5-6 and C6-7 due to spurring. Upper chest: Lung apices clear bilaterally Other: None IMPRESSION: 1. No acute intracranial abnormality progressive  atrophy and chronic microvascular ischemic change since 2016 2. Negative for cervical spine fracture. Moderate cervical spondylosis. Electronically Signed   By: Franchot Gallo M.D.   On: 02/08/2022 18:42   DG Chest Portable 1 View  Result Date: 02/08/2022 CLINICAL DATA:  Hip fracture EXAM: PORTABLE CHEST 1 VIEW COMPARISON:  Chest x-ray  10/17/2021.  Chest CT 07/18/2021 FINDINGS: Postop CABG. Heart size within normal limits. Negative for heart failure. Mild bibasilar airspace disease likely atelectasis.  No effusion. Bilateral calcified pleural plaques. IMPRESSION: Mild bibasilar atelectasis. Calcified pleural plaques bilaterally. Electronically Signed   By: Franchot Gallo M.D.   On: 02/08/2022 18:15   DG Knee Left Port  Result Date: 02/09/2022 CLINICAL DATA:  Known fracture of the left hip. EXAM: PORTABLE LEFT KNEE - 1-2 VIEW COMPARISON:  None Available. FINDINGS: No evidence of fracture, dislocation, or joint effusion. Tricompartmental joint space narrowing prominent in the medial tibiofemoral and patellofemoral compartments with marginal osteophytes. Multiple vascular clips along the medial aspect of the knee. IMPRESSION: 1. No evidence of fracture or dislocation. 2. Moderate knee osteoarthritis prominent in the medial tibiofemoral and patellofemoral compartments. Electronically Signed   By: Keane Police D.O.   On: 02/09/2022 08:42   DG Hip Unilat With Pelvis 2-3 Views Left  Result Date: 02/08/2022 CLINICAL DATA:  Trauma, fall EXAM: DG HIP (WITH OR WITHOUT PELVIS) 2-3V LEFT COMPARISON:  None Available. FINDINGS: There is comminuted fracture in the neck of left femur. There is no significant displacement or angulation at the fracture site. Degenerative changes are noted in the visualized lumbar spine. Vascular calcifications are seen in the soft tissues. Surgical clips are seen in the left upper thigh. IMPRESSION: Comminuted, essentially undisplaced fracture is seen in the neck of left femur. Electronically Signed   By: Elmer Picker M.D.   On: 02/08/2022 16:59        Scheduled Meds:  amitriptyline  25 mg Oral QHS   chlorhexidine  60 mL Topical Once   metoprolol tartrate  25 mg Oral BID   pantoprazole  40 mg Oral Daily   povidone-iodine  2 application. Topical Once   rosuvastatin  10 mg Oral Daily   Continuous  Infusions:   ceFAZolin (ANCEF) IV     tranexamic acid       LOS: 1 day    Time spent: 35 minutes    Barb Merino, MD [facility] Pager [phone]

## 2022-02-09 NOTE — Anesthesia Preprocedure Evaluation (Addendum)
Anesthesia Evaluation  Patient identified by MRN, date of birth, ID band Patient awake    Reviewed: Allergy & Precautions, NPO status , Patient's Chart, lab work & pertinent test results  Airway Mallampati: I  TM Distance: >3 FB Neck ROM: Full   Comment: Round mass in posterior upper soft palate, with erythema/blackness in color to it Dental no notable dental hx. (+) Edentulous Upper, Edentulous Lower   Pulmonary former smoker,    Pulmonary exam normal breath sounds clear to auscultation       Cardiovascular hypertension, + CAD and + CABG  Normal cardiovascular exam Rhythm:Regular Rate:Normal  2017 EF 60%   Neuro/Psych PSYCHIATRIC DISORDERS Depression CVA, No Residual Symptoms    GI/Hepatic Neg liver ROS, GERD  ,  Endo/Other  diabetes  Renal/GU Lab Results      Component                Value               Date                      CREATININE               1.08                02/09/2022                K                        3.9                 02/09/2022                    Musculoskeletal  (+) Arthritis ,   Abdominal   Peds  Hematology Lab Results      Component                Value               Date                         HGB                      16.4                02/09/2022                HCT                      46.8                02/09/2022                   PLT                      209                 02/09/2022              Anesthesia Other Findings Patient complaining of left eye burning prior to induction  Reproductive/Obstetrics                           Anesthesia Physical Anesthesia Plan  ASA: 3  Anesthesia Plan: Spinal, Regional and General   Post-op  Pain Management: Regional block* and Minimal or no pain anticipated   Induction:   PONV Risk Score and Plan: 2 and Treatment may vary due to age or medical condition and Ondansetron  Airway Management Planned: Natural  Airway and Nasal Cannula  Additional Equipment: None  Intra-op Plan:   Post-operative Plan:   Informed Consent: I have reviewed the patients History and Physical, chart, labs and discussed the procedure including the risks, benefits and alternatives for the proposed anesthesia with the patient or authorized representative who has indicated his/her understanding and acceptance.     Dental advisory given  Plan Discussed with: CRNA  Anesthesia Plan Comments: (preop femoral block by Dr. Edwinna Areola. Plan for Spinal vs. GETA depending on patient positioning. Norton Blizzard, MD  )       Anesthesia Quick Evaluation

## 2022-02-09 NOTE — Progress Notes (Signed)
AssistedDr. Valma Cava with left, femoral block. Side rails up, monitors on throughout procedure. See vital signs in flow sheet. Tolerated Procedure well.

## 2022-02-09 NOTE — Anesthesia Procedure Notes (Addendum)
Procedure Name: Intubation Date/Time: 02/09/2022 2:44 PM Performed by: Gerald Leitz, CRNA Pre-anesthesia Checklist: Patient identified, Patient being monitored, Timeout performed, Emergency Drugs available and Suction available Patient Re-evaluated:Patient Re-evaluated prior to induction Oxygen Delivery Method: Circle system utilized Preoxygenation: Pre-oxygenation with 100% oxygen Induction Type: IV induction Ventilation: Mask ventilation without difficulty Laryngoscope Size: Mac and 3 Grade View: Grade I Tube type: Oral Tube size: 7.5 mm Number of attempts: 1 Airway Equipment and Method: Stylet Placement Confirmation: ETT inserted through vocal cords under direct vision, positive ETCO2 and breath sounds checked- equal and bilateral Secured at: 22 cm Tube secured with: Tape Dental Injury: Teeth and Oropharynx as per pre-operative assessment  Comments: Darkened/Black area noted at the base of patients uvula.

## 2022-02-09 NOTE — Interval H&P Note (Signed)
History and Physical Interval Note:  02/09/2022 2:11 PM  Trevor Ward  has presented today for surgery, with the diagnosis of Left hip fracture.  The various methods of treatment have been discussed with the patient and family. After consideration of risks, benefits and other options for treatment, the patient has consented to  Procedure(s): TOTAL HIP ARTHROPLASTY ANTERIOR APPROACH (Left) as a surgical intervention.  [name] history has been reviewed, patient examined, no change in status, stable for surgery.  I have reviewed the patient's chart and labs.  Questions were answered to the patient's satisfaction.    Discussed R/B/A with patient's wife. All questions solicited and answered.  The risks, benefits, and alternatives were discussed with the patient. There are risks associated with the surgery including, but not limited to, problems with anesthesia (death), infection, instability (giving out of the joint), dislocation, differences in leg length/angulation/rotation, fracture of bones, loosening or failure of implants, hematoma (blood accumulation) which may require surgical drainage, blood clots, pulmonary embolism, nerve injury (foot drop and lateral thigh numbness), and blood vessel injury. The patient understands these risks and elects to proceed.    Hilton Cork Jeremih Dearmas

## 2022-02-09 NOTE — Op Note (Signed)
OPERATIVE REPORT  SURGEON: Rod Can, MD   ASSISTANT: Larene Pickett, PA-C  PREOPERATIVE DIAGNOSIS: Displaced Left femoral neck fracture.   POSTOPERATIVE DIAGNOSIS: Displaced Left femoral neck fracture.   PROCEDURE: Left total hip arthroplasty, anterior approach.   IMPLANTS: Biomet Taperloc Reduced Distal stem, size 20 x 160 mm, high offset. Biomet G7 OsseoTi Cup, size 62 mm. Biomet Vivacit-E liner, size 36 mm, H, neutral. Biomet metal head ball, size 36 + 0 mm.  ANESTHESIA:  General  ANTIBIOTICS: 2g ancef.  ESTIMATED BLOOD LOSS:-375 mL    DRAINS: None.  COMPLICATIONS: None   CONDITION: PACU - hemodynamically stable.   BRIEF CLINICAL NOTE: Trevor Ward is a 81 y.o. male with a displaced Left femoral neck fracture. The patient was admitted to the hospitalist service and underwent perioperative risk stratification and medical optimization. The risks, benefits, and alternatives to total hip arthroplasty were explained, and the patient elected to proceed.  PROCEDURE IN DETAIL: The patient was taken to the operating room and general anesthesia was induced on the hospital bed.  The patient was then positioned on the Hana table.  All bony prominences were well padded.  The hip was prepped and draped in the normal sterile surgical fashion.  A time-out was called verifying side and site of surgery. Antibiotics were given within 60 minutes of beginning the procedure.   Bikini incision was made, and the direct anterior approach to the hip was performed through the Hueter interval.  Lateral femoral circumflex vessels were treated with the Auqumantys. The anterior capsule was exposed and an inverted T capsulotomy was made.  Fracture hematoma was encountered and evacuated. The patient was found to have a comminuted Left subcapital femoral neck fracture.  I freshened the femoral neck cut with a saw.  I removed the femoral neck fragment.  A corkscrew was placed into the head and the head was  removed.  This was passed to the back table and was measured. The pubofemoral ligament was released subperiosteally to the lesser trochanter.  Acetabular exposure was achieved, and the pulvinar and labrum were excised. Sequential reaming of the acetabulum was then performed up to a size 61 mm reamer under direct visulization. A 62 mm cup was then opened and impacted into place at approximately 40 degrees of abduction and 20 degrees of anteversion. The final polyethylene liner was impacted into place and acetabular osteophytes were removed.    I then gained femoral exposure taking care to protect the abductors and greater trochanter.  This was performed using standard external rotation, extension, and adduction.  A cookie cutter was used to enter the femoral canal, and then the femoral canal finder was placed.  Sequential broaching was performed up to a size 20.  Calcar planer was used on the femoral neck remnant.  I placed a high offset neck and a trial head ball.  The hip was reduced.  Leg lengths and offset were checked fluoroscopically.  The hip was dislocated and trial components were removed.  The final implants were placed, and the hip was reduced.  Fluoroscopy was used to confirm component position and leg lengths.  At 90 degrees of external rotation and full extension, the hip was stable to an anterior directed force.   The wound was copiously irrigated with Irrisept solution and normal saline using pule lavage.  Marcaine solution was injected into the periarticular soft tissue.  The wound was closed in layers using #1 Stratafix for the fascia, 2-0 Vicryl for the subcutaneous fat, 2-0 Monocryl  for the deep dermal layer, and staples + Dermabond for the skin.  Once the glue was fully dried, an Aquacell Ag dressing was applied.  The patient was transported to the recovery room in stable condition.  Sponge, needle, and instrument counts were correct at the end of the case x2.  The patient tolerated the  procedure well and there were no known complications.  Please note that a surgical assistant was a medical necessity for this procedure to perform it in a safe and expeditious manner. Assistant was necessary to provide appropriate retraction of vital neurovascular structures, to prevent femoral fracture, and to allow for anatomic placement of the prosthesis.

## 2022-02-09 NOTE — Consult Note (Signed)
ORTHOPAEDIC CONSULTATION  REQUESTING PHYSICIAN: Barb Merino, MD  PCP:  Mayra Neer, MD  Chief Complaint: left hip pain after fall   HPI: Trevor Ward is a 81 y.o. male who presented to the ED 02/08/22 after a fall at home in his driveway. He reports significant pain in the left hip hip and reports being unable to bear weight on the left leg. He has a history of diabetes, PVD, heart disease with previous MI and CABG, and CVA. Patient reports that he lives with his wife and is typically very stable but has been unsteady on his feet the past few weeks which has limited his mobility. He denies dizziness and loss of consciousness with fall. He normally ambulates with a cane but did not have it at the time of the fall. He takes aspirin '81mg'$  once per day.   Patient was able to provide some information but was confused about where he was and the year. Information confirmed with family.   ED course: Patient was found to have a comminuted left femoral neck fracture. ED consulted orthopedics with Sheryle Hail PA-C, who discussed surgery with the patient and his family. Patient was referred to Dr. Sid Falcon services for surgical intervention this afternoon. Patient made NPO for OR.     Past Medical History:  Diagnosis Date   Arthritis    "fingers" (02/01/2016)   Depression    Diabetes mellitus without complication (HCC)    GERD (gastroesophageal reflux disease)    High cholesterol    "can't take RX; made my legs weak" (02/01/2016)   History of gout    History of hiatal hernia    Hypertension    Peripheral vascular disease ([place])    carotid artery stenosis   Skin cancer of face     "the good kind"   Stroke [facility]) 2016   denies residual on 02/01/2016   Past Surgical History:  Procedure Laterality Date   CARDIAC CATHETERIZATION N/A 02/01/2016   Procedure: Left Heart Cath and Coronary Angiography;  Surgeon: Burnell Blanks, MD;  Location: [place] [facility];  Service:  Cardiovascular;  Laterality: N/A;   COLONOSCOPY     CORONARY ARTERY BYPASS GRAFT N/A 02/02/2016   Procedure: CORONARY ARTERY BYPASS GRAFTING (CABG) times 4 using left internal mammary and left saphenous ven harvested by endoven ;  Surgeon: Grace Isaac, MD;  Location: [place];  Service: Open Heart Surgery;  Laterality: N/A;   ENDARTERECTOMY Right 04/21/2015   Procedure: RIGHT CAROTID ENDARTERECTOMY;  Surgeon: Elam Dutch, MD;  Location: [place];  Service: Vascular;  Laterality: Right;   ESOPHAGOGASTRODUODENOSCOPY (EGD) WITH PROPOFOL N/A 01/03/2016   Procedure: ESOPHAGOGASTRODUODENOSCOPY (EGD) WITH PROPOFOL;  Surgeon: Garlan Fair, MD;  Location: [facility];  Service: Endoscopy;  Laterality: N/A;   KNEE ARTHROSCOPY Right    open heart surgery  01/2016   PERIPHERAL VASCULAR CATHETERIZATION Bilateral 04/14/2015   Procedure: Carotid Angiography;  Surgeon: Serafina Mitchell, MD;  Location: [place] [facility];  Service: Cardiovascular;  Laterality: Bilateral;   SKIN CANCER EXCISION Right    "temple"   TEE WITHOUT CARDIOVERSION N/A 02/02/2016   Procedure: TRANSESOPHAGEAL ECHOCARDIOGRAM (TEE);  Surgeon: Grace Isaac, MD;  Location: [place];  Service: Open Heart Surgery;  Laterality: N/A;   Social History   Socioeconomic History   Marital status: Married    Spouse name: Not on file   Number of children: Not on file   Years of education: Not on file  Highest education level: Not on file  Occupational History   Not on file  Tobacco Use   Smoking status: Former    Years: 1.50    Types: Cigarettes    Start date: 01/28/1956   Smokeless tobacco: Never   Tobacco comments:    "quit smoking cigarettes when I was 81 yr old"  Vaping Use   Vaping Use: Never used  Substance and Sexual Activity   Alcohol use: No    Alcohol/week: 0.0 standard drinks   Drug use: No   Sexual activity: Yes  Other Topics Concern   Not on file  Social History Narrative   Epworth Sleepiness Scale = 16 (as of  01/28/16)   Social Determinants of Health   Financial Resource Strain: Not on file  Food Insecurity: Not on file  Transportation Needs: Not on file  Physical Activity: Not on file  Stress: Not on file  Social Connections: Not on file   Family History  Problem Relation Age of Onset   CAD Mother    CVA Mother    Diabetes Mother    Breast cancer Mother    Parkinson's disease Mother    Arthritis Mother    Heart attack Father    CVA Sister    CAD Brother    Diabetes Brother    CVA Brother    CAD Brother    Diabetes Brother    Allergies  Allergen Reactions   Statins Other (See Comments)    Severe myalgias   Prior to Admission medications   Medication Sig Start Date End Date Taking? Authorizing Provider  allopurinol (ZYLOPRIM) 100 MG tablet Take 100 mg by mouth daily.  01/13/16  Yes [provider]  amitriptyline (ELAVIL) 25 MG tablet Take 25 mg by mouth at bedtime. 04/19/19  Yes [provider]  aspirin EC 81 MG EC tablet Take 1 tablet (81 mg total) by mouth daily. 02/10/16  Yes Barrett, Lodema Hong, PA-C  buPROPion (WELLBUTRIN XL) 300 MG 24 hr tablet  05/19/19  Yes [provider]  colchicine 0.6 MG tablet  03/27/16  Yes [provider]  Glucosamine Sulfate 1000 MG TABS Take 2,000 mg by mouth daily.   Yes [provider]  meclizine (ANTIVERT) 25 MG tablet Take 25 mg by mouth 4 (four) times daily as needed for dizziness. 02/05/18  Yes [provider]  metoprolol tartrate (LOPRESSOR) 25 MG tablet TAKE 1 TABLET TWO TIMES DAILY. MAKE OFFICE VISIT FOR MORE REFILLS. Patient taking differently: Take 25 mg by mouth 2 (two) times daily. 12/26/21  Yes Croitoru, Mihai, MD  pantoprazole (PROTONIX) 40 MG tablet Take 1 tablet (40 mg total) by mouth daily. 01/01/17  Yes Almyra Deforest, PA  rosuvastatin (CRESTOR) 10 MG tablet Take 10 mg by mouth daily. 12/01/20  Yes [provider]  vitamin B-12 (CYANOCOBALAMIN) 500 MCG tablet Take 500 mcg by mouth  daily. Patient not taking: Reported on 02/08/2022    [provider]   CT Head Wo Contrast  Result Date: 02/08/2022 CLINICAL DATA:  Fall. EXAM: CT HEAD WITHOUT CONTRAST CT CERVICAL SPINE WITHOUT CONTRAST TECHNIQUE: Multidetector CT imaging of the head and cervical spine was performed following the standard protocol without intravenous contrast. Multiplanar CT image reconstructions of the cervical spine were also generated. RADIATION DOSE REDUCTION: This exam was performed according to the departmental dose-optimization program which includes automated exposure control, adjustment of the mA and/or kV according to patient size and/or use of iterative reconstruction technique. COMPARISON:  CT head  04/11/2015 FINDINGS: CT HEAD FINDINGS Brain: Negative for acute infarct, hemorrhage, mass. Progressive atrophy and progressive chronic microvascular ischemic change in the white matter compared with 2016. Vascular: Negative for hyperdense vessel. Skull: Negative Sinuses/Orbits: Paranasal sinuses clear.  No orbital lesion. Other: None CT CERVICAL SPINE FINDINGS Alignment: Mild anterolisthesis C3-4. Straightening of the cervical lordosis. Skull base and vertebrae: Negative for fracture Soft tissues and spinal canal: No acute soft tissue abnormality. Disc levels: Multilevel disc and facet degeneration. Disc degeneration most prominent at C4-5, C5-6, C6-7. Moderate left foraminal narrowing C5-6 and C6-7 due to spurring. Upper chest: Lung apices clear bilaterally Other: None IMPRESSION: 1. No acute intracranial abnormality progressive atrophy and chronic microvascular ischemic change since 2016 2. Negative for cervical spine fracture. Moderate cervical spondylosis. Electronically Signed   By: Franchot Gallo M.D.   On: 02/08/2022 18:42   CT Cervical Spine Wo Contrast  Result Date: 02/08/2022 CLINICAL DATA:  Fall. EXAM: CT HEAD WITHOUT CONTRAST CT CERVICAL SPINE WITHOUT CONTRAST TECHNIQUE: Multidetector CT imaging  of the head and cervical spine was performed following the standard protocol without intravenous contrast. Multiplanar CT image reconstructions of the cervical spine were also generated. RADIATION DOSE REDUCTION: This exam was performed according to the departmental dose-optimization program which includes automated exposure control, adjustment of the mA and/or kV according to patient size and/or use of iterative reconstruction technique. COMPARISON:  CT head 04/11/2015 FINDINGS: CT HEAD FINDINGS Brain: Negative for acute infarct, hemorrhage, mass. Progressive atrophy and progressive chronic microvascular ischemic change in the white matter compared with 2016. Vascular: Negative for hyperdense vessel. Skull: Negative Sinuses/Orbits: Paranasal sinuses clear.  No orbital lesion. Other: None CT CERVICAL SPINE FINDINGS Alignment: Mild anterolisthesis C3-4. Straightening of the cervical lordosis. Skull base and vertebrae: Negative for fracture Soft tissues and spinal canal: No acute soft tissue abnormality. Disc levels: Multilevel disc and facet degeneration. Disc degeneration most prominent at C4-5, C5-6, C6-7. Moderate left foraminal narrowing C5-6 and C6-7 due to spurring. Upper chest: Lung apices clear bilaterally Other: None IMPRESSION: 1. No acute intracranial abnormality progressive atrophy and chronic microvascular ischemic change since 2016 2. Negative for cervical spine fracture. Moderate cervical spondylosis. Electronically Signed   By: Franchot Gallo M.D.   On: 02/08/2022 18:42   DG Chest Portable 1 View  Result Date: 02/08/2022 CLINICAL DATA:  Hip fracture EXAM: PORTABLE CHEST 1 VIEW COMPARISON:  Chest x-ray 10/17/2021.  Chest CT 07/18/2021 FINDINGS: Postop CABG. Heart size within normal limits. Negative for heart failure. Mild bibasilar airspace disease likely atelectasis.  No effusion. Bilateral calcified pleural plaques. IMPRESSION: Mild bibasilar atelectasis. Calcified pleural plaques bilaterally.  Electronically Signed   By: Franchot Gallo M.D.   On: 02/08/2022 18:15   DG Knee Left Port  Result Date: 02/09/2022 CLINICAL DATA:  Known fracture of the left hip. EXAM: PORTABLE LEFT KNEE - 1-2 VIEW COMPARISON:  None Available. FINDINGS: No evidence of fracture, dislocation, or joint effusion. Tricompartmental joint space narrowing prominent in the medial tibiofemoral and patellofemoral compartments with marginal osteophytes. Multiple vascular clips along the medial aspect of the knee. IMPRESSION: 1. No evidence of fracture or dislocation. 2. Moderate knee osteoarthritis prominent in the medial tibiofemoral and patellofemoral compartments. Electronically Signed   By: Keane Police D.O.   On: 02/09/2022 08:42   DG Hip Unilat With Pelvis 2-3 Views Left  Result Date: 02/08/2022 CLINICAL DATA:  Trauma, fall EXAM: DG HIP (WITH OR WITHOUT PELVIS) 2-3V LEFT COMPARISON:  None Available. FINDINGS: There is comminuted fracture in the neck  of left femur. There is no significant displacement or angulation at the fracture site. Degenerative changes are noted in the visualized lumbar spine. Vascular calcifications are seen in the soft tissues. Surgical clips are seen in the left upper thigh. IMPRESSION: Comminuted, essentially undisplaced fracture is seen in the neck of left femur. Electronically Signed   By: Elmer Picker M.D.   On: 02/08/2022 16:59    Positive ROS: All other systems have been reviewed and were otherwise negative with the exception of those mentioned in the HPI and as above.  Physical Exam: Patient was able to answer questions and provide some information but was confused about where he was and the year. No family at bedside during time of examination.   General: Alert, no acute distress. Lying in bed with ice on left hip.  Cardiovascular: No pedal edema Respiratory: No cyanosis, no use of accessory musculature GI: No organomegaly, abdomen is soft and non-tender Skin: No wounds, lesions,  or erythema in the area of chief complaint Neurologic: Sensation intact distally Psychiatric: Patient is competent for consent with normal mood and affect Lymphatic: No axillary or cervical lymphadenopathy  MUSCULOSKELETAL:  Left lower extremity: slight shortening and external rotation. He has pain with movement of the left hip in his anterolateral thigh. Mild swelling over left hip region.    - Motor function intact in LE bilaterally including dorsiflexion, plantar flexion, and EHL.  -Distal pulses 2+ bilaterally. No significant pedal edema.  -Calves soft and non-tender bilaterally.   Assessment: Left comminuted femoral neck fracture.   Plan: Patient has an unstable left comminuted femoral neck fracture. He will require surgical treatment for pain control and allow immediate mobilization out of bed. TRH consulted for admission, perioperative medical optimization. Was unable to discuss R/B/A with patient as he was not oriented to place and year. Will contact family to plan for right total hip arthroplasty vs hemiarthroplasty and obtain consent. Hold chemical DVT ppx at this time. He is to remain NPO.   Charlott Rakes, PA-C    02/09/2022 10:55 AM

## 2022-02-09 NOTE — Transfer of Care (Signed)
Immediate Anesthesia Transfer of Care Note  Patient: Trevor Ward  Procedure(s) Performed: Procedure(s): TOTAL HIP ARTHROPLASTY ANTERIOR APPROACH (Left)  Patient Location: PACU  Anesthesia Type:General  Level of Consciousness: Alert, Awake, Oriented  Airway & Oxygen Therapy: Patient Spontanous Breathing  Post-op Assessment: Report given to RN  Post vital signs: Reviewed and stable  Last Vitals:  Vitals:   02/09/22 1310 02/09/22 1645  BP: (!) 148/80 126/60  Pulse: (!) 102 93  Resp:  20  Temp:  37.8 C  SpO2: 06% 77%    Complications: No apparent anesthesia complications

## 2022-02-09 NOTE — H&P (View-Only) (Signed)
ORTHOPAEDIC CONSULTATION  REQUESTING PHYSICIAN: Barb Merino, MD  PCP:  Mayra Neer, MD  Chief Complaint: left hip pain after fall   HPI: Trevor Ward is a 81 y.o. male who presented to the ED 02/08/22 after a fall at home in his driveway. He reports significant pain in the left hip hip and reports being unable to bear weight on the left leg. He has a history of diabetes, PVD, heart disease with previous MI and CABG, and CVA. Patient reports that he lives with his wife and is typically very stable but has been unsteady on his feet the past few weeks which has limited his mobility. He denies dizziness and loss of consciousness with fall. He normally ambulates with a cane but did not have it at the time of the fall. He takes aspirin '81mg'$  once per day.   Patient was able to provide some information but was confused about where he was and the year. Information confirmed with family.   ED course: Patient was found to have a comminuted left femoral neck fracture. ED consulted orthopedics with Sheryle Hail PA-C, who discussed surgery with the patient and his family. Patient was referred to Dr. Sid Falcon services for surgical intervention this afternoon. Patient made NPO for OR.     Past Medical History:  Diagnosis Date   Arthritis    "fingers" (02/01/2016)   Depression    Diabetes mellitus without complication (HCC)    GERD (gastroesophageal reflux disease)    High cholesterol    "can't take RX; made my legs weak" (02/01/2016)   History of gout    History of hiatal hernia    Hypertension    Peripheral vascular disease ([place])    carotid artery stenosis   Skin cancer of face     "the good kind"   Stroke [facility]) 2016   denies residual on 02/01/2016   Past Surgical History:  Procedure Laterality Date   CARDIAC CATHETERIZATION N/A 02/01/2016   Procedure: Left Heart Cath and Coronary Angiography;  Surgeon: Burnell Blanks, MD;  Location: [place];  Service:  Cardiovascular;  Laterality: N/A;   COLONOSCOPY     CORONARY ARTERY BYPASS GRAFT N/A 02/02/2016   Procedure: CORONARY ARTERY BYPASS GRAFTING (CABG) times 4 using left internal mammary and left saphenous ven harvested by endoven ;  Surgeon: Grace Isaac, MD;  Location: [place];  Service: Open Heart Surgery;  Laterality: N/A;   ENDARTERECTOMY Right 04/21/2015   Procedure: RIGHT CAROTID ENDARTERECTOMY;  Surgeon: Elam Dutch, MD;  Location: [facility];  Service: Vascular;  Laterality: Right;   ESOPHAGOGASTRODUODENOSCOPY (EGD) WITH PROPOFOL N/A 01/03/2016   Procedure: ESOPHAGOGASTRODUODENOSCOPY (EGD) WITH PROPOFOL;  Surgeon: Garlan Fair, MD;  Location: [facility];  Service: Endoscopy;  Laterality: N/A;   KNEE ARTHROSCOPY Right    open heart surgery  01/2016   PERIPHERAL VASCULAR CATHETERIZATION Bilateral 04/14/2015   Procedure: Carotid Angiography;  Surgeon: Serafina Mitchell, MD;  Location: [place] [facility];  Service: Cardiovascular;  Laterality: Bilateral;   SKIN CANCER EXCISION Right    "temple"   TEE WITHOUT CARDIOVERSION N/A 02/02/2016   Procedure: TRANSESOPHAGEAL ECHOCARDIOGRAM (TEE);  Surgeon: Grace Isaac, MD;  Location: [place];  Service: Open Heart Surgery;  Laterality: N/A;   Social History   Socioeconomic History   Marital status: Married    Spouse name: Not on file   Number of children: Not on file   Years of education: Not on file  Highest education level: Not on file  Occupational History   Not on file  Tobacco Use   Smoking status: Former    Years: 1.50    Types: Cigarettes    Start date: 01/28/1956   Smokeless tobacco: Never   Tobacco comments:    "quit smoking cigarettes when I was 81 yr old"  Vaping Use   Vaping Use: Never used  Substance and Sexual Activity   Alcohol use: No    Alcohol/week: 0.0 standard drinks   Drug use: No   Sexual activity: Yes  Other Topics Concern   Not on file  Social History Narrative   Epworth Sleepiness Scale = 16 (as of  01/28/16)   Social Determinants of Health   Financial Resource Strain: Not on file  Food Insecurity: Not on file  Transportation Needs: Not on file  Physical Activity: Not on file  Stress: Not on file  Social Connections: Not on file   Family History  Problem Relation Age of Onset   CAD Mother    CVA Mother    Diabetes Mother    Breast cancer Mother    Parkinson's disease Mother    Arthritis Mother    Heart attack Father    CVA Sister    CAD Brother    Diabetes Brother    CVA Brother    CAD Brother    Diabetes Brother    Allergies  Allergen Reactions   Statins Other (See Comments)    Severe myalgias   Prior to Admission medications   Medication Sig Start Date End Date Taking? Authorizing Provider  allopurinol (ZYLOPRIM) 100 MG tablet Take 100 mg by mouth daily.  01/13/16  Yes [provider]  amitriptyline (ELAVIL) 25 MG tablet Take 25 mg by mouth at bedtime. 04/19/19  Yes [provider]  aspirin EC 81 MG EC tablet Take 1 tablet (81 mg total) by mouth daily. 02/10/16  Yes Barrett, Lodema Hong, PA-C  buPROPion (WELLBUTRIN XL) 300 MG 24 hr tablet  05/19/19  Yes [provider]  colchicine 0.6 MG tablet  03/27/16  Yes [provider]  Glucosamine Sulfate 1000 MG TABS Take 2,000 mg by mouth daily.   Yes [provider]  meclizine (ANTIVERT) 25 MG tablet Take 25 mg by mouth 4 (four) times daily as needed for dizziness. 02/05/18  Yes [provider]  metoprolol tartrate (LOPRESSOR) 25 MG tablet TAKE 1 TABLET TWO TIMES DAILY. MAKE OFFICE VISIT FOR MORE REFILLS. Patient taking differently: Take 25 mg by mouth 2 (two) times daily. 12/26/21  Yes Croitoru, Mihai, MD  pantoprazole (PROTONIX) 40 MG tablet Take 1 tablet (40 mg total) by mouth daily. 01/01/17  Yes Almyra Deforest, PA  rosuvastatin (CRESTOR) 10 MG tablet Take 10 mg by mouth daily. 12/01/20  Yes [provider]  vitamin B-12 (CYANOCOBALAMIN) 500 MCG tablet Take 500 mcg by mouth  daily. Patient not taking: Reported on 02/08/2022    [provider]   CT Head Wo Contrast  Result Date: 02/08/2022 CLINICAL DATA:  Fall. EXAM: CT HEAD WITHOUT CONTRAST CT CERVICAL SPINE WITHOUT CONTRAST TECHNIQUE: Multidetector CT imaging of the head and cervical spine was performed following the standard protocol without intravenous contrast. Multiplanar CT image reconstructions of the cervical spine were also generated. RADIATION DOSE REDUCTION: This exam was performed according to the departmental dose-optimization program which includes automated exposure control, adjustment of the mA and/or kV according to patient size and/or use of iterative reconstruction technique. COMPARISON:  CT head  04/11/2015 FINDINGS: CT HEAD FINDINGS Brain: Negative for acute infarct, hemorrhage, mass. Progressive atrophy and progressive chronic microvascular ischemic change in the white matter compared with 2016. Vascular: Negative for hyperdense vessel. Skull: Negative Sinuses/Orbits: Paranasal sinuses clear.  No orbital lesion. Other: None CT CERVICAL SPINE FINDINGS Alignment: Mild anterolisthesis C3-4. Straightening of the cervical lordosis. Skull base and vertebrae: Negative for fracture Soft tissues and spinal canal: No acute soft tissue abnormality. Disc levels: Multilevel disc and facet degeneration. Disc degeneration most prominent at C4-5, C5-6, C6-7. Moderate left foraminal narrowing C5-6 and C6-7 due to spurring. Upper chest: Lung apices clear bilaterally Other: None IMPRESSION: 1. No acute intracranial abnormality progressive atrophy and chronic microvascular ischemic change since 2016 2. Negative for cervical spine fracture. Moderate cervical spondylosis. Electronically Signed   By: Franchot Gallo M.D.   On: 02/08/2022 18:42   CT Cervical Spine Wo Contrast  Result Date: 02/08/2022 CLINICAL DATA:  Fall. EXAM: CT HEAD WITHOUT CONTRAST CT CERVICAL SPINE WITHOUT CONTRAST TECHNIQUE: Multidetector CT imaging  of the head and cervical spine was performed following the standard protocol without intravenous contrast. Multiplanar CT image reconstructions of the cervical spine were also generated. RADIATION DOSE REDUCTION: This exam was performed according to the departmental dose-optimization program which includes automated exposure control, adjustment of the mA and/or kV according to patient size and/or use of iterative reconstruction technique. COMPARISON:  CT head 04/11/2015 FINDINGS: CT HEAD FINDINGS Brain: Negative for acute infarct, hemorrhage, mass. Progressive atrophy and progressive chronic microvascular ischemic change in the white matter compared with 2016. Vascular: Negative for hyperdense vessel. Skull: Negative Sinuses/Orbits: Paranasal sinuses clear.  No orbital lesion. Other: None CT CERVICAL SPINE FINDINGS Alignment: Mild anterolisthesis C3-4. Straightening of the cervical lordosis. Skull base and vertebrae: Negative for fracture Soft tissues and spinal canal: No acute soft tissue abnormality. Disc levels: Multilevel disc and facet degeneration. Disc degeneration most prominent at C4-5, C5-6, C6-7. Moderate left foraminal narrowing C5-6 and C6-7 due to spurring. Upper chest: Lung apices clear bilaterally Other: None IMPRESSION: 1. No acute intracranial abnormality progressive atrophy and chronic microvascular ischemic change since 2016 2. Negative for cervical spine fracture. Moderate cervical spondylosis. Electronically Signed   By: Franchot Gallo M.D.   On: 02/08/2022 18:42   DG Chest Portable 1 View  Result Date: 02/08/2022 CLINICAL DATA:  Hip fracture EXAM: PORTABLE CHEST 1 VIEW COMPARISON:  Chest x-ray 10/17/2021.  Chest CT 07/18/2021 FINDINGS: Postop CABG. Heart size within normal limits. Negative for heart failure. Mild bibasilar airspace disease likely atelectasis.  No effusion. Bilateral calcified pleural plaques. IMPRESSION: Mild bibasilar atelectasis. Calcified pleural plaques bilaterally.  Electronically Signed   By: Franchot Gallo M.D.   On: 02/08/2022 18:15   DG Knee Left Port  Result Date: 02/09/2022 CLINICAL DATA:  Known fracture of the left hip. EXAM: PORTABLE LEFT KNEE - 1-2 VIEW COMPARISON:  None Available. FINDINGS: No evidence of fracture, dislocation, or joint effusion. Tricompartmental joint space narrowing prominent in the medial tibiofemoral and patellofemoral compartments with marginal osteophytes. Multiple vascular clips along the medial aspect of the knee. IMPRESSION: 1. No evidence of fracture or dislocation. 2. Moderate knee osteoarthritis prominent in the medial tibiofemoral and patellofemoral compartments. Electronically Signed   By: Keane Police D.O.   On: 02/09/2022 08:42   DG Hip Unilat With Pelvis 2-3 Views Left  Result Date: 02/08/2022 CLINICAL DATA:  Trauma, fall EXAM: DG HIP (WITH OR WITHOUT PELVIS) 2-3V LEFT COMPARISON:  None Available. FINDINGS: There is comminuted fracture in the neck  of left femur. There is no significant displacement or angulation at the fracture site. Degenerative changes are noted in the visualized lumbar spine. Vascular calcifications are seen in the soft tissues. Surgical clips are seen in the left upper thigh. IMPRESSION: Comminuted, essentially undisplaced fracture is seen in the neck of left femur. Electronically Signed   By: Elmer Picker M.D.   On: 02/08/2022 16:59    Positive ROS: All other systems have been reviewed and were otherwise negative with the exception of those mentioned in the HPI and as above.  Physical Exam: Patient was able to answer questions and provide some information but was confused about where he was and the year. No family at bedside during time of examination.   General: Alert, no acute distress. Lying in bed with ice on left hip.  Cardiovascular: No pedal edema Respiratory: No cyanosis, no use of accessory musculature GI: No organomegaly, abdomen is soft and non-tender Skin: No wounds, lesions,  or erythema in the area of chief complaint Neurologic: Sensation intact distally Psychiatric: Patient is competent for consent with normal mood and affect Lymphatic: No axillary or cervical lymphadenopathy  MUSCULOSKELETAL:  Left lower extremity: slight shortening and external rotation. He has pain with movement of the left hip in his anterolateral thigh. Mild swelling over left hip region.    - Motor function intact in LE bilaterally including dorsiflexion, plantar flexion, and EHL.  -Distal pulses 2+ bilaterally. No significant pedal edema.  -Calves soft and non-tender bilaterally.   Assessment: Left comminuted femoral neck fracture.   Plan: Patient has an unstable left comminuted femoral neck fracture. He will require surgical treatment for pain control and allow immediate mobilization out of bed. TRH consulted for admission, perioperative medical optimization. Was unable to discuss R/B/A with patient as he was not oriented to place and year. Will contact family to plan for right total hip arthroplasty vs hemiarthroplasty and obtain consent. Hold chemical DVT ppx at this time. He is to remain NPO.   Charlott Rakes, PA-C    02/09/2022 10:55 AM

## 2022-02-09 NOTE — Progress Notes (Signed)
Initial Nutrition Assessment  DOCUMENTATION CODES:   Non-severe (moderate) malnutrition in context of chronic illness  INTERVENTION:   -Ensure Plus High Protein po BID, each supplement provides 350 kcal and 20 grams of protein.   -Multivitamin with minerals daily  NUTRITION DIAGNOSIS:   Moderate Malnutrition related to chronic illness (CAD s/p CABG) as evidenced by moderate fat depletion, moderate muscle depletion.  GOAL:   Patient will meet greater than or equal to 90% of their needs  MONITOR:   PO intake, Supplement acceptance, Labs, Weight trends, I & O's  REASON FOR ASSESSMENT:   Consult Hip fracture protocol  ASSESSMENT:   81 y.o. male with medical history significant for CAD status post CABG, PAD, hypertension, and thoracic aortic aneurysm, now presenting to the emergency department with left hip pain after a fall. Admitted for left hip fracture.  Patient reports good appetite, states when he retired he gained weight. Pt denies issues with chewing or swallowing.  Per chart review, pt has been having balance issues for the past few months. Pt did not feel this impacted his intake.   Currently NPO, awaiting hip surgery. He is agreeable to receiving protein supplements once diet is advanced.   Per weight records, no weight loss. Pt denies any weight changes.  Medications reviewed.  Labs reviewed:  Low Na  NUTRITION - FOCUSED PHYSICAL EXAM:  Flowsheet Row Most Recent Value  Orbital Region Moderate depletion  Upper Arm Region Moderate depletion  Thoracic and Lumbar Region Moderate depletion  Buccal Region Moderate depletion  Temple Region Moderate depletion  Clavicle Bone Region Moderate depletion  Clavicle and Acromion Bone Region Moderate depletion  Scapular Bone Region Moderate depletion  Dorsal Hand Mild depletion  Patellar Region Mild depletion  Anterior Thigh Region Mild depletion  Posterior Calf Region Mild depletion  Edema (RD Assessment) None   Hair Reviewed  Eyes Reviewed  Mouth Reviewed  Skin Reviewed       Diet Order:   Diet Order             Diet NPO time specified  Diet effective now                   EDUCATION NEEDS:   Education needs have been addressed  Skin:  Skin Assessment: Reviewed RN Assessment  Last BM:  5/17  Height:   Ht Readings from Last 1 Encounters:  02/08/22 '5\' 8"'$  (1.727 m)    Weight:   Wt Readings from Last 1 Encounters:  02/08/22 90.7 kg    BMI:  Body mass index is 30.41 kg/m.  Estimated Nutritional Needs:   Kcal:  8676-1950  Protein:  80-90g  Fluid:  1.9L/day   Clayton Bibles, MS, RD, LDN Inpatient Clinical Dietitian Contact information available via Amion

## 2022-02-09 NOTE — Anesthesia Postprocedure Evaluation (Signed)
Anesthesia Post Note  Patient: Trevor Ward  Procedure(s) Performed: TOTAL HIP ARTHROPLASTY ANTERIOR APPROACH (Left: Hip)     Patient location during evaluation: PACU Anesthesia Type: General Level of consciousness: awake and alert Pain management: pain level controlled Vital Signs Assessment: post-procedure vital signs reviewed and stable Respiratory status: spontaneous breathing, nonlabored ventilation and respiratory function stable Cardiovascular status: blood pressure returned to baseline and stable Postop Assessment: no apparent nausea or vomiting Anesthetic complications: no   No notable events documented.  Last Vitals:  Vitals:   02/09/22 1715 02/09/22 1730  BP: (!) 110/59 123/63  Pulse: 97 98  Resp: 15 15  Temp:    SpO2: 93% 94%    Last Pain:  Vitals:   02/09/22 1730  TempSrc:   PainSc: Asleep                 Jeananne Bedwell A.

## 2022-02-10 ENCOUNTER — Encounter (HOSPITAL_COMMUNITY): Payer: Self-pay | Admitting: Orthopedic Surgery

## 2022-02-10 DIAGNOSIS — S72002A Fracture of unspecified part of neck of left femur, initial encounter for closed fracture: Secondary | ICD-10-CM | POA: Diagnosis not present

## 2022-02-10 LAB — CBC
HCT: 38.2 % — ABNORMAL LOW (ref 39.0–52.0)
Hemoglobin: 13.2 g/dL (ref 13.0–17.0)
MCH: 31 pg (ref 26.0–34.0)
MCHC: 34.6 g/dL (ref 30.0–36.0)
MCV: 89.7 fL (ref 80.0–100.0)
Platelets: 154 10*3/uL (ref 150–400)
RBC: 4.26 MIL/uL (ref 4.22–5.81)
RDW: 13.8 % (ref 11.5–15.5)
WBC: 11.5 10*3/uL — ABNORMAL HIGH (ref 4.0–10.5)
nRBC: 0 % (ref 0.0–0.2)

## 2022-02-10 LAB — BASIC METABOLIC PANEL
Anion gap: 7 (ref 5–15)
BUN: 29 mg/dL — ABNORMAL HIGH (ref 8–23)
CO2: 22 mmol/L (ref 22–32)
Calcium: 8.1 mg/dL — ABNORMAL LOW (ref 8.9–10.3)
Chloride: 105 mmol/L (ref 98–111)
Creatinine, Ser: 1.18 mg/dL (ref 0.61–1.24)
GFR, Estimated: >60 mL/min (ref >60)
Glucose, Bld: 176 mg/dL — ABNORMAL HIGH (ref 70–99)
Potassium: 4.1 mmol/L (ref 3.5–5.1)
Sodium: 134 mmol/L — ABNORMAL LOW (ref 135–145)

## 2022-02-10 MED ORDER — ASPIRIN 325 MG PO TBEC
325.0000 mg | DELAYED_RELEASE_TABLET | Freq: Every day | ORAL | 0 refills | Status: DC
Start: 2022-02-10 — End: 2022-02-10

## 2022-02-10 MED ORDER — ASPIRIN 81 MG PO CHEW: 81.0000 mg | CHEWABLE_TABLET | Freq: Two times a day (BID) | ORAL | 0 refills | Status: AC

## 2022-02-10 MED ORDER — HYDROCODONE-ACETAMINOPHEN 10-325 MG PO TABS
0.5000 | ORAL_TABLET | ORAL | 0 refills | Status: AC | PRN
Start: 2022-02-10 — End: 2022-02-17

## 2022-02-10 NOTE — Evaluation (Signed)
Occupational Therapy Evaluation Patient Details Name: Trevor Ward MRN: [id number] DOB: 12-25-1940 Today's Date: 02/10/2022   History of Present Illness 81 y.o. male presenting to the emergency department with left hip pain after a fall.  xray=comminuted left femoral neck fracture; skeletal fx negative. pt is now s/p L DA THA on 02/09/22.  PMH:  CAD, CABG, PAD, HTN, and thoracic aortic aneurysm,   Clinical Impression   Patient is currently requiring assistance with ADLs including total assist with standing Lower body ADLs as pt unable to safely remove a hand from his RW yet, setup to Min guard assist with Upper body ADLs and seated LE ADLs,  as well as  Min assist with bed mobility and Moderate assist of 2 people with functional transfers to BSC/recliner.  Current level of function is below patient's typical baseline.  During this evaluation, patient was limited by generalized weakness, impaired activity tolerance, and cognitive deficits as well as continued numbness to LLE, all of which has the potential to impact patient's and caregiver's safety and independence during functional mobility, as well as performance for ADLs.  Patient lives in a 1 level house with steps to enter, with his wife who works during the day. Pt's wife reports their neighbor can provide daily checks on pt while wife at work, but 24/7 assistance may not be available.  Spouse still working on this. Patient demonstrates good rehab potential, and should benefit from continued skilled occupational therapy services while in acute care to maximize safety, independence and quality of life at home.  Continued occupational therapy services in a SNF setting prior to return home is recommended.  ?    Recommendations for follow up therapy are one component of a multi-disciplinary discharge planning process, led by the attending physician.  Recommendations may be updated based on patient status, additional functional criteria and  insurance authorization.   Follow Up Recommendations  Skilled nursing-short term rehab (<3 hours/day)    Assistance Recommended at Discharge Frequent or constant Supervision/Assistance  Patient can return home with the following A lot of help with walking and/or transfers;A lot of help with bathing/dressing/bathroom;Direct supervision/assist for financial management;Direct supervision/assist for medications management;Assistance with cooking/housework;Assist for transportation    Functional Status Assessment  Patient has had a recent decline in their functional status and demonstrates the ability to make significant improvements in function in a reasonable and predictable amount of time.  Equipment Recommendations   (Defer to post acute recommendations)    Recommendations for Other Services       Precautions / Restrictions Precautions Precautions: Fall Restrictions Weight Bearing Restrictions: No LLE Weight Bearing: Weight bearing as tolerated      Mobility Bed Mobility   Bed Mobility: Sit to Supine     Supine to sit: Min assist     General bed mobility comments: min assist with LLE into bed with increased time/effort. Cues for sequencing. Pt also able to come to long sitting with supervision and scoot up in bed posteriorly with supervision/cues.    Transfers                          Balance Overall balance assessment: History of Falls, Needs assistance Sitting-balance support: Feet supported, Bilateral upper extremity supported Sitting balance-Leahy Scale: Fair     Standing balance support: Reliant on assistive device for balance, Bilateral upper extremity supported Standing balance-Leahy Scale: Poor Standing balance comment: LT knee buckles  ADL either performed or assessed with clinical judgement   ADL Overall ADL's : Needs assistance/impaired Eating/Feeding: Modified independent   Grooming: Set  up;Supervision/safety;Sitting;Wash/dry hands Grooming Details (indicate cue type and reason): Knees buckled unpredictably and pt reported continued numbness to LLE, therefore did not attempt standing ADLs. Upper Body Bathing: Min guard;Set up;Sitting   Lower Body Bathing: Moderate assistance;Sitting/lateral leans;Sit to/from stand   Upper Body Dressing : Min guard;Set up;Sitting   Lower Body Dressing: Min guard;Sit to/from stand;Sitting/lateral leans;Total assistance;+2 for physical assistance Lower Body Dressing Details (indicate cue type and reason): Pt able to don and doff both RT and LT socks, taking increased time on LT with Min Guard to trunk for safety as pt leaning down to foot/unable to perform figure 4. Standing LE dressing would take Total assist of 2 people as pt required BUE support on RW. Toilet Transfer: +2 for physical assistance;Moderate assistance;BSC/3in1;Cueing for sequencing;Stand-pivot Toilet Transfer Details (indicate cue type and reason): Pt stood from recliner with cues for hand palcement and Min As. Pt used RW to pivot to EOB with Mod As and step by step cues for sequencing. Pt able to reach back with LT hand to bed rail to control descent with Min As. Toileting- Clothing Manipulation and Hygiene: Maximal assistance;Sitting/lateral lean;Total assistance;+2 for physical assistance;Sit to/from stand       Functional mobility during ADLs: Minimal assistance;Moderate assistance;Cueing for sequencing;Rolling walker (2 wheels);+2 for physical assistance       Vision   Vision Assessment?: No apparent visual deficits     Perception     Praxis      Pertinent Vitals/Pain Pain Assessment Pain Assessment: No/denies pain Pain Intervention(s): Monitored during session, Limited activity within patient's tolerance, Premedicated before session, Repositioned     Hand Dominance Right   Extremity/Trunk Assessment Upper Extremity Assessment Upper Extremity Assessment:  Overall WFL for tasks assessed   Lower Extremity Assessment Lower Extremity Assessment: Defer to PT evaluation RLE Deficits / Details: grossly WFL LLE Deficits / Details: ankle WFL, knee flexion 3/5, knee extension 2/5-possibly limtied by nerve block. hip grossly 2+/5; pt denies numbness and tingling       Communication Communication Communication: HOH   Cognition Arousal/Alertness: Awake/alert Behavior During Therapy: WFL for tasks assessed/performed Overall Cognitive Status: Within Functional Limits for tasks assessed                                 General Comments: Unable to give accurate DOB. Spouse assisting.  Oriented to general place and city, year. NEeds cues for situation.     General Comments       Exercises     Shoulder Instructions      Home Living Family/patient expects to be discharged to:: Private residence Living Arrangements: Spouse/significant other Available Help at Discharge: Available PRN/intermittently;Family;Neighbor Type of Home: House Home Access: Stairs to enter [employer] of Steps: 1+small step+1 Entrance Stairs-Rails: Can reach both;Right;Left Home Layout: One level     Bathroom Shower/Tub: Occupational psychologist: Handicapped height     Home Equipment: [place] - single point;Shower seat - built in;Hand held shower head   Additional Comments: could arrange 24 hr assist if needed      Prior Functioning/Environment Prior Level of Function : Independent/Modified Independent             Mobility Comments: per wife recent incr difficulty with mobility, 2 falls day of fall resulting in hip fx,  Lift recliner          OT Problem List: Decreased cognition;Impaired sensation;Decreased activity tolerance;Decreased safety awareness;Decreased knowledge of use of DME or AE;Impaired balance (sitting and/or standing)      OT Treatment/Interventions: Self-care/ADL training;Therapeutic activities;Cognitive  remediation/compensation;DME and/or AE instruction;Patient/family education;Balance training    OT Goals(Current goals can be found in the care plan section) Acute Rehab OT Goals Patient Stated Goal: To go dancing OT Goal Formulation: With patient/family Time For Goal Achievement: 02/24/22 Potential to Achieve Goals: Good ADL Goals Pt Will Perform Grooming: standing;with supervision (at least 1 task without any knee buckle or LOB) Pt Will Perform Lower Body Dressing: sit to/from stand;sitting/lateral leans;with set-up Pt Will Transfer to Toilet: ambulating;with supervision;bedside commode Pt Will Perform Toileting - Clothing Manipulation and hygiene: with modified independence;sitting/lateral leans;sit to/from stand Additional ADL Goal #1: Patient will identify at least 3 fall prevention strategies to employ at home in order to maximize function and safety during ADLs and decrease caregiver burden while preventing possible injury and rehospitalization.  OT Frequency: Min 2X/week    Co-evaluation              AM-PAC OT "6 Clicks" Daily Activity     Outcome Measure Help from another person eating meals?: None Help from another person taking care of personal grooming?: A Little Help from another person toileting, which includes using toliet, bedpan, or urinal?: A Lot Help from another person bathing (including washing, rinsing, drying)?: A Lot Help from another person to put on and taking off regular upper body clothing?: A Little Help from another person to put on and taking off regular lower body clothing?: A Lot 6 Click Score: 16   End of Session Equipment Utilized During Treatment: Gait belt;Rolling walker (2 wheels) Nurse Communication: Other (comment) (CNA assisted to helping pt back to bed.)  Activity Tolerance: Patient tolerated treatment well Patient left: in bed;with call bell/phone within reach;with bed alarm set;with family/visitor present ([place] on floor)  OT Visit  Diagnosis: Unsteadiness on feet (R26.81);Other symptoms and signs involving cognitive function;History of falling (Z91.81)                Time: 9937-1696 OT Time Calculation (min): 30 min Charges:  OT General Charges $OT Visit: 1 Visit OT Evaluation $OT Eval Low Complexity: 1 Low OT Treatments $Therapeutic Activity: 8-22 mins  Anderson Malta, OT Acute Rehab Services Office: [phone] 02/10/2022  Julien Girt 02/10/2022, 2:28 PM

## 2022-02-10 NOTE — Progress Notes (Signed)
PROGRESS NOTE    Trevor Ward  GYK:[id number] DOB: [birth year] DOA: 02/08/2022 PCP: Mayra Neer, MD    Brief Narrative:  81 year old with history of coronary artery disease status post CABG, peripheral artery disease, hypertension and thoracic aortic aneurysm presented with mechanical fall and left hip pain and found to have left hip fracture.  In the emergency room hemodynamically stable.  Skeletal survey negative except left hip fracture.  Admitted for surgical fixation.   Assessment & Plan:   Closed traumatic fracture left hip: S/p total hip 5/18 Dr Lyla Glassing  PT/OT, mobilize , WBAT Adequate pain control with iv and oral opioids. DVT prophylaxis with aspirin 325 mg daily.  SCDs.  Delirium: Patient has developed some.  Delirium.  Anticipated from surgery and anesthesia. Fall precautions.  Delirium precautions.  Coronary artery disease: Stable.  Initially mildly elevated troponins.  High-dose aspirin..  On a statin and beta-blockers.  No ongoing chest pain.  Elevated bilirubin: Probably Gilbert's syndrome.  Benign.  No indication to intervene.   DVT prophylaxis: SCDs Start: 02/09/22 1802   Code Status: Full code Family Communication: None today. Disposition Plan: Status is: Inpatient Remains inpatient appropriate because: Inpatient surgical plan.     Consultants:  Orthopedics  Procedures:  None  Antimicrobials:  Perioperative   Subjective:  Patient seen and examined.  He tells me that he does not know when he came to the hospital.  He is not aware about him having hip surgery.  Some confusion overnight and now clearing up.  No family at bedside. Denies any pain.  Objective: Vitals:   02/09/22 2133 02/10/22 0210 02/10/22 0556 02/10/22 0941  BP: 120/72 109/61 (!) 112/57 111/60  Pulse: 98 67 71 89  Resp: '16 16 17 15  '$ Temp: (!) 97.5 F (36.4 C) 97.7 F (36.5 C) 97.7 F (36.5 C) 97.8 F (36.6 C)  TempSrc: Oral Oral Oral   SpO2: 97% 97% 99% 97%   Weight:      Height:        Intake/Output Summary (Last 24 hours) at 02/10/2022 1119 Last data filed at 02/10/2022 0943 Gross per 24 hour  Intake 3039.83 ml  Output 1045 ml  Net 1994.83 ml   Filed Weights   02/08/22 1407 02/09/22 1310  Weight: 90.7 kg 90.7 kg    Examination:  General exam: Appears calm and comfortable  Slightly impulsive.  He is alert oriented x1-2.  No focal deficits.  Pleasant to conversation.  Not in any pain at rest. Respiratory system: No added sounds. Cardiovascular system: S1 & S2 heard, RRR.  Gastrointestinal system: Soft.  Nontender.  Bowel sound present.   Central nervous system: Alert and oriented. No focal neurological deficits. Extremities: Symmetric 5 x 5 power. Left hip anterior incision clean and dry.  Minimal tenderness as anticipated from surgery.    Data Reviewed: I have personally reviewed following labs and imaging studies  CBC: Recent Labs  Lab 02/08/22 1759 02/09/22 0700 02/10/22 0349  WBC 12.8* 12.0* 11.5*  NEUTROABS 10.8* 10.3*  --   HGB 17.3* 16.4 13.2  HCT 51.7 46.8 38.2*  MCV 89.1 87.5 89.7  PLT 211 209 793   Basic Metabolic Panel: Recent Labs  Lab 02/08/22 1759 02/09/22 0700 02/10/22 0349  NA 138 134* 134*  K 5.3* 3.9 4.1  CL 104 104 105  CO2 25 21* 22  GLUCOSE 115* 165* 176*  BUN 18 22 29*  CREATININE 1.24 1.08 1.18  CALCIUM 9.2 8.5* 8.1*   GFR: Estimated Creatinine Clearance:  51.9 mL/min (by C-G formula based on SCr of 1.18 mg/dL). Liver Function Tests: Recent Labs  Lab 02/08/22 1759 02/09/22 0700  AST 32 16  ALT 17 13  ALKPHOS 67 58  BILITOT 1.8* 1.8*  PROT 8.3* 7.5  ALBUMIN 4.4 3.9   No results for input(s): LIPASE, AMYLASE in the last 168 hours. No results for input(s): AMMONIA in the last 168 hours. Coagulation Profile: No results for input(s): INR, PROTIME in the last 168 hours. Cardiac Enzymes: No results for input(s): CKTOTAL, CKMB, CKMBINDEX, TROPONINI in the last 168 hours. BNP  (last 3 results) No results for input(s): PROBNP in the last 8760 hours. HbA1C: No results for input(s): HGBA1C in the last 72 hours. CBG: Recent Labs  Lab 02/09/22 1646  GLUCAP 140*   Lipid Profile: No results for input(s): CHOL, HDL, LDLCALC, TRIG, CHOLHDL, LDLDIRECT in the last 72 hours. Thyroid Function Tests: No results for input(s): TSH, T4TOTAL, FREET4, T3FREE, THYROIDAB in the last 72 hours. Anemia Panel: No results for input(s): VITAMINB12, FOLATE, FERRITIN, TIBC, IRON, RETICCTPCT in the last 72 hours. Sepsis Labs: No results for input(s): PROCALCITON, LATICACIDVEN in the last 168 hours.  No results found for this or any previous visit (from the past 240 hour(s)).       Radiology Studies: CT Head Wo Contrast  Result Date: 02/08/2022 CLINICAL DATA:  Fall. EXAM: CT HEAD WITHOUT CONTRAST CT CERVICAL SPINE WITHOUT CONTRAST TECHNIQUE: Multidetector CT imaging of the head and cervical spine was performed following the standard protocol without intravenous contrast. Multiplanar CT image reconstructions of the cervical spine were also generated. RADIATION DOSE REDUCTION: This exam was performed according to the departmental dose-optimization program which includes automated exposure control, adjustment of the mA and/or kV according to patient size and/or use of iterative reconstruction technique. COMPARISON:  CT head 04/11/2015 FINDINGS: CT HEAD FINDINGS Brain: Negative for acute infarct, hemorrhage, mass. Progressive atrophy and progressive chronic microvascular ischemic change in the white matter compared with 2016. Vascular: Negative for hyperdense vessel. Skull: Negative Sinuses/Orbits: Paranasal sinuses clear.  No orbital lesion. Other: None CT CERVICAL SPINE FINDINGS Alignment: Mild anterolisthesis C3-4. Straightening of the cervical lordosis. Skull base and vertebrae: Negative for fracture Soft tissues and spinal canal: No acute soft tissue abnormality. Disc levels: Multilevel  disc and facet degeneration. Disc degeneration most prominent at C4-5, C5-6, C6-7. Moderate left foraminal narrowing C5-6 and C6-7 due to spurring. Upper chest: Lung apices clear bilaterally Other: None IMPRESSION: 1. No acute intracranial abnormality progressive atrophy and chronic microvascular ischemic change since 2016 2. Negative for cervical spine fracture. Moderate cervical spondylosis. Electronically Signed   By: Franchot Gallo M.D.   On: 02/08/2022 18:42   CT Cervical Spine Wo Contrast  Result Date: 02/08/2022 CLINICAL DATA:  Fall. EXAM: CT HEAD WITHOUT CONTRAST CT CERVICAL SPINE WITHOUT CONTRAST TECHNIQUE: Multidetector CT imaging of the head and cervical spine was performed following the standard protocol without intravenous contrast. Multiplanar CT image reconstructions of the cervical spine were also generated. RADIATION DOSE REDUCTION: This exam was performed according to the departmental dose-optimization program which includes automated exposure control, adjustment of the mA and/or kV according to patient size and/or use of iterative reconstruction technique. COMPARISON:  CT head 04/11/2015 FINDINGS: CT HEAD FINDINGS Brain: Negative for acute infarct, hemorrhage, mass. Progressive atrophy and progressive chronic microvascular ischemic change in the white matter compared with 2016. Vascular: Negative for hyperdense vessel. Skull: Negative Sinuses/Orbits: Paranasal sinuses clear.  No orbital lesion. Other: None CT CERVICAL SPINE FINDINGS Alignment:  Mild anterolisthesis C3-4. Straightening of the cervical lordosis. Skull base and vertebrae: Negative for fracture Soft tissues and spinal canal: No acute soft tissue abnormality. Disc levels: Multilevel disc and facet degeneration. Disc degeneration most prominent at C4-5, C5-6, C6-7. Moderate left foraminal narrowing C5-6 and C6-7 due to spurring. Upper chest: Lung apices clear bilaterally Other: None IMPRESSION: 1. No acute intracranial abnormality  progressive atrophy and chronic microvascular ischemic change since 2016 2. Negative for cervical spine fracture. Moderate cervical spondylosis. Electronically Signed   By: Franchot Gallo M.D.   On: 02/08/2022 18:42   Pelvis Portable  Result Date: 02/09/2022 CLINICAL DATA:  Left femoral neck fracture status post hip replacement EXAM: PORTABLE PELVIS 1-2 VIEWS COMPARISON:  02/08/2022 FINDINGS: Frontal view of the pelvis and both hips excludes the iliac crests by collimation. Interval placement of a left hip arthroplasty in the expected position without signs of acute complication. Postsurgical changes are seen in the soft tissues overlying the left hip. IMPRESSION: 1. Unremarkable left hip arthroplasty. Electronically Signed   By: Randa Ngo M.D.   On: 02/09/2022 17:01   DG Chest Portable 1 View  Result Date: 02/08/2022 CLINICAL DATA:  Hip fracture EXAM: PORTABLE CHEST 1 VIEW COMPARISON:  Chest x-ray 10/17/2021.  Chest CT 07/18/2021 FINDINGS: Postop CABG. Heart size within normal limits. Negative for heart failure. Mild bibasilar airspace disease likely atelectasis.  No effusion. Bilateral calcified pleural plaques. IMPRESSION: Mild bibasilar atelectasis. Calcified pleural plaques bilaterally. Electronically Signed   By: Franchot Gallo M.D.   On: 02/08/2022 18:15   DG Knee Left Port  Result Date: 02/09/2022 CLINICAL DATA:  Known fracture of the left hip. EXAM: PORTABLE LEFT KNEE - 1-2 VIEW COMPARISON:  None Available. FINDINGS: No evidence of fracture, dislocation, or joint effusion. Tricompartmental joint space narrowing prominent in the medial tibiofemoral and patellofemoral compartments with marginal osteophytes. Multiple vascular clips along the medial aspect of the knee. IMPRESSION: 1. No evidence of fracture or dislocation. 2. Moderate knee osteoarthritis prominent in the medial tibiofemoral and patellofemoral compartments. Electronically Signed   By: Keane Police D.O.   On: 02/09/2022 08:42    DG C-Arm 1-60 Min-No Report  Result Date: 02/09/2022 Fluoroscopy was utilized by the requesting physician.  No radiographic interpretation.   DG HIP UNILAT WITH PELVIS 1V LEFT  Result Date: 02/09/2022 CLINICAL DATA:  Left total hip replacement, intraoperative images EXAM: DG HIP (WITH OR WITHOUT PELVIS) 1V*L*; DG C-ARM 1-60 MIN-NO REPORT COMPARISON:  Left hip radiographs 02/08/2022 FINDINGS: Surgical changes of left hip arthroplasty. No evidence of immediate hardware complication. IMPRESSION: Intraoperative images demonstrate left hip arthroplasty without evidence of immediate hardware complication. Electronically Signed   By: Jacqulynn Cadet M.D.   On: 02/09/2022 16:20   DG Hip Unilat With Pelvis 2-3 Views Left  Result Date: 02/08/2022 CLINICAL DATA:  Trauma, fall EXAM: DG HIP (WITH OR WITHOUT PELVIS) 2-3V LEFT COMPARISON:  None Available. FINDINGS: There is comminuted fracture in the neck of left femur. There is no significant displacement or angulation at the fracture site. Degenerative changes are noted in the visualized lumbar spine. Vascular calcifications are seen in the soft tissues. Surgical clips are seen in the left upper thigh. IMPRESSION: Comminuted, essentially undisplaced fracture is seen in the neck of left femur. Electronically Signed   By: Elmer Picker M.D.   On: 02/08/2022 16:59        Scheduled Meds:  amitriptyline  25 mg Oral QHS   aspirin EC  325 mg Oral Q breakfast  docusate sodium  100 mg Oral BID   feeding supplement  237 mL Oral BID BM   metoprolol tartrate  25 mg Oral BID   multivitamin with minerals  1 tablet Oral Daily   pantoprazole  40 mg Oral Daily   rosuvastatin  10 mg Oral Daily   senna  1 tablet Oral BID   Continuous Infusions:  sodium chloride 50 mL/hr at 02/09/22 1816   methocarbamol (ROBAXIN) IV       LOS: 2 days    Time spent: 35 minutes    Barb Merino, MD [facility] Pager [phone]

## 2022-02-10 NOTE — Evaluation (Addendum)
Physical Therapy Evaluation Patient Details Name: Trevor Ward MRN: [id number] DOB: 1941/08/10 Today's Date: 02/10/2022  History of Present Illness  81 y.o. male presenting to the emergency department with left hip pain after a fall.  xray=comminuted left femoral neck fracture; skeletal fx negative. pt is now s/p L DA THA on 02/09/22.  PMH:  CAD, CABG, PAD, HTN, and thoracic aortic aneurysm,  Clinical Impression  Pt is s/p THA resulting in the deficits listed below (see PT Problem List).  Pt assisted to chair with 2 person assist. Limited by LLE knee buckling, possibly d/t residual effects of regional nerve block. Will continue to follow. Pt will likely need SNF post acute   Pt will benefit from skilled PT to increase their independence and safety with mobility to allow discharge to the venue listed below.         Recommendations for follow up therapy are one component of a multi-disciplinary discharge planning process, led by the attending physician.  Recommendations may be updated based on patient status, additional functional criteria and insurance authorization.  Follow Up Recommendations Skilled nursing-short term rehab (<3 hours/day)    Assistance Recommended at Discharge Frequent or constant Supervision/Assistance  Patient can return home with the following  Two people to help with bathing/dressing/bathroom;Help with stairs or ramp for entrance;Direct supervision/assist for medications management;Assistance with cooking/housework;Direct supervision/assist for financial management;Assist for transportation;Two people to help with walking and/or transfers    Equipment Recommendations Rolling walker (2 wheels)  Recommendations for Other Services       Functional Status Assessment Patient has had a recent decline in their functional status and demonstrates the ability to make significant improvements in function in a reasonable and predictable amount of time.     Precautions /  Restrictions Precautions Precautions: Fall Restrictions LLE Weight Bearing: Weight bearing as tolerated      Mobility  Bed Mobility Overal bed mobility: Needs Assistance Bed Mobility: Supine to Sit     Supine to sit: Min assist, HOB elevated     General bed mobility comments: min assist with LLE, incr time and effort    Transfers Overall transfer level: Needs assistance Equipment used: Rolling walker (2 wheels) Transfers: Sit to/from Stand Sit to Stand: +2 safety/equipment, +2 physical assistance, Max assist           General transfer comment: +2 assist with anterior-superior wt shift, pt with bil knee buckling L>R and posterior LOB on initial standing trial. repeated standing with same results, returned pt to sitting EOB and performed stand pivot with assist to advance LLE for pivot, therapist blocking LLE to prevent buckling and allow for wt shift and pivot to chair. L knee buckling possibly related to residual effects of femoral nerve block.    Ambulation/Gait               General Gait Details: unable  Stairs            Wheelchair Mobility    Modified Rankin (Stroke Patients Only)       Balance Overall balance assessment: Needs assistance Sitting-balance support: Feet supported, Bilateral upper extremity supported Sitting balance-Leahy Scale: Fair       Standing balance-Leahy Scale: Zero                               Pertinent Vitals/Pain Pain Assessment Pain Assessment: No/denies pain    Home Living Family/patient expects to be discharged to:: Private residence Living  Arrangements: Spouse/significant other Available Help at Discharge: Family Type of Home: House Home Access: Stairs to enter   Technical brewer of Steps: 1+small step+1   Home Layout: One level Home Equipment: Cane - single point Additional Comments: could arrange 24 hr assist if needed    Prior Function Prior Level of Function :  Independent/Modified Independent             Mobility Comments: per wife recent incr difficulty with mobility, 2 falls day of fall resultingin hip fx       Hand Dominance        Extremity/Trunk Assessment   Upper Extremity Assessment Upper Extremity Assessment: Defer to OT evaluation    Lower Extremity Assessment Lower Extremity Assessment: LLE deficits/detail;RLE deficits/detail RLE Deficits / Details: grossly WFL LLE Deficits / Details: ankle WFL, knee flexion 3/5, knee extension 2/5-possibly limtied by nerve block. hip grossly 2+/5; pt denies numbness and tingling       Communication   Communication: HOH  Cognition Arousal/Alertness: Awake/alert Behavior During Therapy: WFL for tasks assessed/performed Overall Cognitive Status: cues for redirection to task, follows one step commands with incr time                                          General Comments      Exercises     Assessment/Plan    PT Assessment Patient needs continued PT services  PT Problem List Decreased strength;Decreased range of motion;Decreased activity tolerance;Decreased balance;Decreased knowledge of use of DME;Decreased mobility;Pain       PT Treatment Interventions DME instruction;Therapeutic exercise;Gait training;Functional mobility training;Therapeutic activities;Patient/family education;Balance training    PT Goals (Current goals can be found in the Care Plan section)  Acute Rehab PT Goals Patient Stated Goal: home PT Goal Formulation: With patient Time For Goal Achievement: 02/24/22 Potential to Achieve Goals: Good    Frequency Min 3X/week     Co-evaluation               AM-PAC PT "6 Clicks" Mobility  Outcome Measure Help needed turning from your back to your side while in a flat bed without using bedrails?: A Little Help needed moving from lying on your back to sitting on the side of a flat bed without using bedrails?: A Little Help needed moving  to and from a bed to a chair (including a wheelchair)?: Total Help needed standing up from a chair using your arms (e.g., wheelchair or bedside chair)?: Total Help needed to walk in hospital room?: Total Help needed climbing 3-5 steps with a railing? : Total 6 Click Score: 10    End of Session Equipment Utilized During Treatment: Gait belt Activity Tolerance: Patient tolerated treatment well Patient left: with call bell/phone within reach;in chair;with chair alarm set;with family/visitor present Nurse Communication: Mobility status PT Visit Diagnosis: Other abnormalities of gait and mobility (R26.89);Difficulty in walking, not elsewhere classified (R26.2)    Time: 5462-7035 PT Time Calculation (min) (ACUTE ONLY): 21 min   Charges:   PT Evaluation $PT Eval Low Complexity: [place], PT  Acute Rehab Dept ([place]) [phone] Pager [phone]  02/10/2022   [facility] 02/10/2022, 12:52 PM

## 2022-02-10 NOTE — Progress Notes (Signed)
Subjective:  Patient reports pain as mild.  Denies N/V/CP/SOB. No complaints of pain. He has not had any medication for pain overnight.  He is pleasantly confused. He does not remember that he had his hip replaced. We discussed this morning.   Spoke with his nurse no acute events overnight. No complaints of pain.   Objective:   VITALS:   Vitals:   02/09/22 1759 02/09/22 2133 02/10/22 0210 02/10/22 0556  BP: 122/70 120/72 109/61 (!) 112/57  Pulse: 99 98 67 71  Resp: 16 16 16 17  Temp: 98.7 F (37.1 C) (!) 97.5 F (36.4 C) 97.7 F (36.5 C) 97.7 F (36.5 C)  TempSrc: Oral Oral Oral Oral  SpO2: 95% 97% 97% 99%  Weight:      Height:        Patient is lying in bed. NAD. Patient is alert but not oriented to place or time. He did not remember that he had his hip replaced. We discussed this today. He was eager to move his hip and had no pain.  ABD soft Neurovascular intact Sensation intact distally Intact pulses distally Dorsiflexion/Plantar flexion intact Incision: dressing C/D/I No cellulitis present Compartment soft Painless log rolling of the hip. No pain with active motion.   Lab Results  Component Value Date   WBC 11.5 (H) 02/10/2022   HGB 13.2 02/10/2022   HCT 38.2 (L) 02/10/2022   MCV 89.7 02/10/2022   PLT 154 02/10/2022   BMET    Component Value Date/Time   NA 134 (L) 02/10/2022 0349   NA 141 07/12/2021 1549   K 4.1 02/10/2022 0349   CL 105 02/10/2022 0349   CO2 22 02/10/2022 0349   GLUCOSE 176 (H) 02/10/2022 0349   BUN 29 (H) 02/10/2022 0349   BUN 12 07/12/2021 1549   CREATININE 1.18 02/10/2022 0349   CREATININE 0.85 05/16/2016 0945   CALCIUM 8.1 (L) 02/10/2022 0349   EGFR 66 07/12/2021 1549   GFRNONAA >60 02/10/2022 0349     Assessment/Plan: 1 Day Post-Op   Principal Problem:   Closed left hip fracture, initial encounter (HCC) Active Problems:   Essential hypertension   Coronary artery disease of native artery of native heart with stable  angina pectoris (HCC)   Thoracic aortic aneurysm without rupture (HCC)   Hyperkalemia   Elevated troponin   Hip fracture (HCC)   Malnutrition of moderate degree   WBAT with walker DVT ppx: Aspirin 325mg daily, SCDs, TEDS PO pain control: Pain controlled/ no pain. PT/OT: PT has not been by to see him yet. They will come today to evaluate.  Dispo: Once PT clears patient is he is okay from the Orthopedic standpoint. Refer to hospitalist team for further medial management and discharge. Rx for DVT ppx and pain medication in chart.     S , PA-C 02/10/2022, 7:20 AM  [facility]  [place] [address], [place], [place] [address] Phone: [phone] [email or web address] Facebook  Instagram  LinkedIn  Twitter       

## 2022-02-11 DIAGNOSIS — S72002A Fracture of unspecified part of neck of left femur, initial encounter for closed fracture: Secondary | ICD-10-CM | POA: Diagnosis not present

## 2022-02-11 LAB — BASIC METABOLIC PANEL
Anion gap: 5 (ref 5–15)
BUN: 29 mg/dL — ABNORMAL HIGH (ref 8–23)
CO2: 27 mmol/L (ref 22–32)
Calcium: 8.3 mg/dL — ABNORMAL LOW (ref 8.9–10.3)
Chloride: 107 mmol/L (ref 98–111)
Creatinine, Ser: 1.11 mg/dL (ref 0.61–1.24)
GFR, Estimated: >60 mL/min (ref >60)
Glucose, Bld: 127 mg/dL — ABNORMAL HIGH (ref 70–99)
Potassium: 4.1 mmol/L (ref 3.5–5.1)
Sodium: 139 mmol/L (ref 135–145)

## 2022-02-11 LAB — CBC
HCT: 36.1 % — ABNORMAL LOW (ref 39.0–52.0)
Hemoglobin: 12.6 g/dL — ABNORMAL LOW (ref 13.0–17.0)
MCH: 31.1 pg (ref 26.0–34.0)
MCHC: 34.9 g/dL (ref 30.0–36.0)
MCV: 89.1 fL (ref 80.0–100.0)
Platelets: 155 10*3/uL (ref 150–400)
RBC: 4.05 MIL/uL — ABNORMAL LOW (ref 4.22–5.81)
RDW: 13.7 % (ref 11.5–15.5)
WBC: 9.8 10*3/uL (ref 4.0–10.5)
nRBC: 0 % (ref 0.0–0.2)

## 2022-02-11 NOTE — TOC Initial Note (Signed)
Transition of Care [facility]) - Initial/Assessment Note   Patient Details  Name: Trevor Ward MRN: [id number] Date of Birth: Feb 20, 1941  Transition of Care [facility]) CM/SW Contact:    Sherie Don, LCSW Phone Number: 02/11/2022, 12:53 PM  Clinical Narrative: PT evaluation recommended SNF and wife/patient are agreeable to rehab. Wife is requesting [facility] as first choice this facility is close to where they live. FL2 done; PASRR received. Initial referral faxed out. TOC awaiting bed offers.  Expected Discharge Plan: Skilled Nursing Facility Barriers to Discharge: Continued Medical Work up  Patient Goals and CMS Choice Patient states their goals for this hospitalization and ongoing recovery are:: Go to [facility] for rehab if possible CMS Medicare.gov Compare Post Acute Care list provided to:: Patient Represenative (must comment) Choice offered to / list presented to : Spouse, Patient  Expected Discharge Plan and Services Expected Discharge Plan: [place] In-house Referral: Clinical Social Work Post Acute Care Choice: [place] Living arrangements for the past 2 months: [place]            DME Arranged: N/A DME Agency: NA  Prior Living Arrangements/Services Living arrangements for the past 2 months: Single Family Home Lives with:: Spouse Patient language and need for interpreter reviewed:: Yes Do you feel safe going back to the place where you live?: Yes      Need for Family Participation in Patient Care: Yes (Comment) Care giver support system in place?: Yes (comment) Criminal Activity/Legal Involvement Pertinent to Current Situation/Hospitalization: No - Comment as needed  Activities of Daily Living Home Assistive Devices/Equipment: Dentures (specify type) (full and dentures) ADL Screening (condition at time of admission) Patient's cognitive ability adequate to safely complete daily activities?: Yes Is the patient deaf or have difficulty  hearing?: Yes Does the patient have difficulty seeing, even when wearing glasses/contacts?: No Does the patient have difficulty concentrating, remembering, or making decisions?: No Patient able to express need for assistance with ADLs?: Yes Does the patient have difficulty dressing or bathing?: Yes Independently performs ADLs?: No Communication: Independent Dressing (OT): Needs assistance Is this a change from baseline?: Pre-admission baseline Grooming: Independent Feeding: Independent Bathing: Needs assistance Is this a change from baseline?: Change from baseline, expected to last >3 days Toileting: Needs assistance Is this a change from baseline?: Change from baseline, expected to last >3days In/Out Bed: Needs assistance Is this a change from baseline?: Change from baseline, expected to last >3 days Walks in Home: Needs assistance Is this a change from baseline?: Change from baseline, expected to last >3 days Does the patient have difficulty walking or climbing stairs?: Yes Weakness of Legs: Left (hurt left hip when fell) Weakness of Arms/Hands: Left (left elbow is hurting since fell)  Permission Sought/Granted Permission sought to share information with : Chartered certified accountant granted to share information with : Yes, Verbal Permission Granted Permission granted to share info w AGENCY: SNFs (first choice is [facility])  Emotional Assessment Orientation: : Oriented to Self, Oriented to Place, Oriented to  Time Alcohol / Substance Use: Not Applicable Psych Involvement: No (comment)  Admission diagnosis:  Hip fracture ([place]) [S72.009A] Troponin level elevated [R77.8] Closed fracture of left hip, initial encounter ([place]) [S72.002A] Fall, initial encounter [W19.XXXA] Closed left hip fracture, initial encounter [facility]) [S72.002A] Patient Active Problem List   Diagnosis Date Noted   Elevated troponin 02/09/2022   Hip fracture ([place]) 02/09/2022   Malnutrition of  moderate degree 02/09/2022   Closed left hip fracture, initial encounter ([place])  02/08/2022   Hyperkalemia 02/08/2022   Thoracic aortic aneurysm without rupture ([place]) 06/19/2019   Coronary artery disease of native artery of native heart with stable angina pectoris ([place]) 04/14/2016   Near syncope 02/28/2016   HOH (hard of hearing) 02/08/2016   S/P CABG x 4 02/02/16 02/02/2016   Exertional dyspnea 01/28/2016   Aortic atherosclerosis ([place]) 01/28/2016   Essential hypertension 07/26/2015   History of Rt CA endarterectomy 07/26/2015   Dyslipidemia    Gout 04/11/2015   History of ischemic right MCA stroke 04/11/2015   PCP:  Mayra Neer, MD Pharmacy:   [facility] ([facility]), [place] [address]  ([place]) [place] [address] Phone: [phone] Fax: [phone]  [facility], [place], [place] [address], [place] [address] Phone: [phone] Fax: [phone]  [facility] [place], [place] [address] Phone: [phone] Fax: [phone]  Readmission Risk Interventions     View : No data to display.

## 2022-02-11 NOTE — Progress Notes (Signed)
   Subjective: 2 Days Post-Op Procedure(s) (LRB): TOTAL HIP ARTHROPLASTY ANTERIOR APPROACH (Left) Patient reports pain as moderate.   Patient seen in rounds for Dr. Lyla Glassing. Patient is resting in bed with wife at the bedside this morning. He appears alert and is responsive to all questions. He complains of pain at this time, which I told his RN about. No acute events overnight. We will continue therapy today.   Objective: Vital signs in last 24 hours: Temp:  [97.8 F (36.6 C)-98.1 F (36.7 C)] 97.9 F (36.6 C) (05/20 0510) Pulse Rate:  [80-85] 85 (05/20 0510) Resp:  [15-20] 18 (05/20 0510) BP: (109-137)/(61-79) 137/79 (05/20 0510) SpO2:  [93 %-95 %] 95 % (05/20 0510)  Intake/Output from previous day:  Intake/Output Summary (Last 24 hours) at 02/11/2022 1114 Last data filed at 02/11/2022 0730 Gross per 24 hour  Intake 470 ml  Output 1150 ml  Net -680 ml     Intake/Output this shift: Total I/O In: -  Out: 300 [Urine:300]  Labs: Recent Labs    02/08/22 1759 02/09/22 0700 02/10/22 0349 02/11/22 0340  HGB 17.3* 16.4 13.2 12.6*   Recent Labs    02/10/22 0349 02/11/22 0340  WBC 11.5* 9.8  RBC 4.26 4.05*  HCT 38.2* 36.1*  PLT 154 155   Recent Labs    02/10/22 0349 02/11/22 0340  NA 134* 139  K 4.1 4.1  CL 105 107  CO2 22 27  BUN 29* 29*  CREATININE 1.18 1.11  GLUCOSE 176* 127*  CALCIUM 8.1* 8.3*   No results for input(s): LABPT, INR in the last 72 hours.  Exam: General - Patient is Alert and Appropriate Extremity - Neurologically intact Sensation intact distally Intact pulses distally Dorsiflexion/Plantar flexion intact Dressing - dressing C/D/I Motor Function - intact, moving foot and toes well on exam.   Past Medical History:  Diagnosis Date   Arthritis    "fingers" (02/01/2016)   Depression    Diabetes mellitus without complication (HCC)    GERD (gastroesophageal reflux disease)    High cholesterol    "can't take RX; made my legs weak"  (02/01/2016)   History of gout    History of hiatal hernia    Hypertension    Peripheral vascular disease ([place])    carotid artery stenosis   Skin cancer of face     "the good kind"   Stroke ([place]) 2016   denies residual on 02/01/2016    Assessment/Plan: 2 Days Post-Op Procedure(s) (LRB): TOTAL HIP ARTHROPLASTY ANTERIOR APPROACH (Left) Principal Problem:   Closed left hip fracture, initial encounter ([place]) Active Problems:   Essential hypertension   Coronary artery disease of native artery of native heart with stable angina pectoris (HCC)   Thoracic aortic aneurysm without rupture (HCC)   Hyperkalemia   Elevated troponin   Hip fracture (HCC)   Malnutrition of moderate degree  Estimated body mass index is 30.41 kg/m as calculated from the following:   Height as of this encounter: '5\' 8"'$  (1.727 m).   Weight as of this encounter: 90.7 kg. Advance diet Up with therapy  DVT Prophylaxis - Aspirin Weight bearing as tolerated.  Hgb stable at 12.6 this AM.  Plan for d/c to SNF pending placement. Will be cleared from an ortho standpoint whenever bed obtained.   Follow up in the office with Dr. Lyla Glassing in 2 weeks.   Griffith Citron, PA-C Orthopedic Surgery [phone] 02/11/2022, 11:14 AM

## 2022-02-11 NOTE — Progress Notes (Signed)
PROGRESS NOTE    Trevor Ward  CVE:[id number] DOB: September 24, 1941 DOA: 02/08/2022 PCP: Mayra Neer, MD   Brief Narrative:  81 year old with history of CAD status post CABG, PAD, HTN, thoracic aortic aneurysm presented after of mechanical fall and left hip pain.  Patient was found to have left hip fracture and orthopedic was consulted.  Patient underwent total hip arthroplasty on 5/18.  Postop DVT recommended aspirin.   Assessment & Plan:  Principal Problem:   Closed left hip fracture, initial encounter [facility]) Active Problems:   Essential hypertension   Coronary artery disease of native artery of native heart with stable angina pectoris (HCC)   Thoracic aortic aneurysm without rupture (HCC)   Hyperkalemia   Elevated troponin   Hip fracture (HCC)   Malnutrition of moderate degree    Closed traumatic fracture left hip: S/p total hip -Weightbearing as tolerated with a walker.  Daily aspirin.  Pain control, bowel regimen.   Delirium Off-and-on postoperatively as expected Amitriptyline at bedtime   Coronary artery disease status post CABG Peripheral arterial disease Currently patient is chest pain-free.  Continue high-dose aspirin for now thereafter switch to baby aspirin.  Continue rest of the home medications-Lopressor twice daily, Crestor   Elevated bilirubin Probably Gilbert's syndrome.  Benign.  No indication to intervene.    DVT prophylaxis: Full dose aspirin Code Status: Full code Family Communication: Wife is present at bedside  Status is: Inpatient Remains inpatient appropriate because: PT/OT recommending SNF, TOC working on placement   Nutritional status    Signs/Symptoms: moderate fat depletion, moderate muscle depletion  Interventions: Ensure Enlive (each supplement provides 350kcal and 20 grams of protein), MVI  Body mass index is 30.41 kg/m.         Subjective: Had some discomfort at the surgical site and received pain medication this  morning.  No complaints during my visit.  Wife is present at bedside.   Examination:  General exam: Appears calm and comfortable  Respiratory system: Clear to auscultation. Respiratory effort normal. Cardiovascular system: S1 & S2 heard, RRR. No JVD, murmurs, rubs, gallops or clicks. No pedal edema. Gastrointestinal system: Abdomen is nondistended, soft and nontender. No organomegaly or masses felt. Normal bowel sounds heard. Central nervous system: Alert and oriented. No focal neurological deficits. Extremities: Symmetric 5 x 5 power.  Left hip surgical dressing in place.  Limited mobility. Skin: No rashes, lesions or ulcers Psychiatry: Judgement and insight appear normal. Mood & affect appropriate.     Objective: Vitals:   02/10/22 1442 02/10/22 1856 02/10/22 2123 02/11/22 0510  BP: 109/61 125/61 124/73 137/79  Pulse: 83 80 83 85  Resp: '16 15 20 18  '$ Temp: 97.8 F (36.6 C) 98.1 F (36.7 C) 97.8 F (36.6 C) 97.9 F (36.6 C)  TempSrc: Oral Oral Oral Oral  SpO2: 93% 95% 94% 95%  Weight:      Height:        Intake/Output Summary (Last 24 hours) at 02/11/2022 0739 Last data filed at 02/11/2022 0500 Gross per 24 hour  Intake 710 ml  Output 850 ml  Net -140 ml   Filed Weights   02/08/22 1407 02/09/22 1310  Weight: 90.7 kg 90.7 kg     Data Reviewed:   CBC: Recent Labs  Lab 02/08/22 1759 02/09/22 0700 02/10/22 0349 02/11/22 0340  WBC 12.8* 12.0* 11.5* 9.8  NEUTROABS 10.8* 10.3*  --   --   HGB 17.3* 16.4 13.2 12.6*  HCT 51.7 46.8 38.2* 36.1*  MCV 89.1 87.5 89.7  89.1  PLT 211 209 154 211   Basic Metabolic Panel: Recent Labs  Lab 02/08/22 1759 02/09/22 0700 02/10/22 0349 02/11/22 0340  NA 138 134* 134* 139  K 5.3* 3.9 4.1 4.1  CL 104 104 105 107  CO2 25 21* 22 27  GLUCOSE 115* 165* 176* 127*  BUN 18 22 29* 29*  CREATININE 1.24 1.08 1.18 1.11  CALCIUM 9.2 8.5* 8.1* 8.3*   GFR: Estimated Creatinine Clearance: 55.1 mL/min (by C-G formula based on SCr of  1.11 mg/dL). Liver Function Tests: Recent Labs  Lab 02/08/22 1759 02/09/22 0700  AST 32 16  ALT 17 13  ALKPHOS 67 58  BILITOT 1.8* 1.8*  PROT 8.3* 7.5  ALBUMIN 4.4 3.9   No results for input(s): LIPASE, AMYLASE in the last 168 hours. No results for input(s): AMMONIA in the last 168 hours. Coagulation Profile: No results for input(s): INR, PROTIME in the last 168 hours. Cardiac Enzymes: No results for input(s): CKTOTAL, CKMB, CKMBINDEX, TROPONINI in the last 168 hours. BNP (last 3 results) No results for input(s): PROBNP in the last 8760 hours. HbA1C: No results for input(s): HGBA1C in the last 72 hours. CBG: Recent Labs  Lab 02/09/22 1646  GLUCAP 140*   Lipid Profile: No results for input(s): CHOL, HDL, LDLCALC, TRIG, CHOLHDL, LDLDIRECT in the last 72 hours. Thyroid Function Tests: No results for input(s): TSH, T4TOTAL, FREET4, T3FREE, THYROIDAB in the last 72 hours. Anemia Panel: No results for input(s): VITAMINB12, FOLATE, FERRITIN, TIBC, IRON, RETICCTPCT in the last 72 hours. Sepsis Labs: No results for input(s): PROCALCITON, LATICACIDVEN in the last 168 hours.  No results found for this or any previous visit (from the past 240 hour(s)).       Radiology Studies: Pelvis Portable  Result Date: 02/09/2022 CLINICAL DATA:  Left femoral neck fracture status post hip replacement EXAM: PORTABLE PELVIS 1-2 VIEWS COMPARISON:  02/08/2022 FINDINGS: Frontal view of the pelvis and both hips excludes the iliac crests by collimation. Interval placement of a left hip arthroplasty in the expected position without signs of acute complication. Postsurgical changes are seen in the soft tissues overlying the left hip. IMPRESSION: 1. Unremarkable left hip arthroplasty. Electronically Signed   By: Randa Ngo M.D.   On: 02/09/2022 17:01   DG Knee Left Port  Result Date: 02/09/2022 CLINICAL DATA:  Known fracture of the left hip. EXAM: PORTABLE LEFT KNEE - 1-2 VIEW COMPARISON:  None  Available. FINDINGS: No evidence of fracture, dislocation, or joint effusion. Tricompartmental joint space narrowing prominent in the medial tibiofemoral and patellofemoral compartments with marginal osteophytes. Multiple vascular clips along the medial aspect of the knee. IMPRESSION: 1. No evidence of fracture or dislocation. 2. Moderate knee osteoarthritis prominent in the medial tibiofemoral and patellofemoral compartments. Electronically Signed   By: Keane Police D.O.   On: 02/09/2022 08:42   DG C-Arm 1-60 Min-No Report  Result Date: 02/09/2022 Fluoroscopy was utilized by the requesting physician.  No radiographic interpretation.   DG HIP UNILAT WITH PELVIS 1V LEFT  Result Date: 02/09/2022 CLINICAL DATA:  Left total hip replacement, intraoperative images EXAM: DG HIP (WITH OR WITHOUT PELVIS) 1V*L*; DG C-ARM 1-60 MIN-NO REPORT COMPARISON:  Left hip radiographs 02/08/2022 FINDINGS: Surgical changes of left hip arthroplasty. No evidence of immediate hardware complication. IMPRESSION: Intraoperative images demonstrate left hip arthroplasty without evidence of immediate hardware complication. Electronically Signed   By: Jacqulynn Cadet M.D.   On: 02/09/2022 16:20        Scheduled Meds:  amitriptyline  25 mg Oral QHS   aspirin EC  325 mg Oral Q breakfast   docusate sodium  100 mg Oral BID   feeding supplement  237 mL Oral BID BM   metoprolol tartrate  25 mg Oral BID   multivitamin with minerals  1 tablet Oral Daily   pantoprazole  40 mg Oral Daily   rosuvastatin  10 mg Oral Daily   senna  1 tablet Oral BID   Continuous Infusions:  sodium chloride Stopped (02/10/22 1615)   methocarbamol (ROBAXIN) IV       LOS: 3 days   Time spent= 35 mins    Yumiko Alkins Arsenio Loader, MD [facility]  If 7PM-7AM, please contact night-coverage  02/11/2022, 7:39 AM

## 2022-02-11 NOTE — Plan of Care (Signed)
  Problem: Education: Goal: Knowledge of General Education information will improve Description: Including pain rating scale, medication(s)/side effects and non-pharmacologic comfort measures Outcome: Progressing   Problem: Pain Managment: Goal: General experience of comfort will improve Outcome: Progressing   Problem: Safety: Goal: Ability to remain free from injury will improve Outcome: Progressing   

## 2022-02-11 NOTE — NC FL2 (Signed)
[place] MEDICAID FL2 LEVEL OF CARE SCREENING TOOL     IDENTIFICATION  Patient Name: Trevor Ward Birthdate: Jul 04, 1941 Sex: male Admission Date (Current Location): 02/08/2022  [facility] and [place] Number:  Herbalist and Address:  [facility],  [place], [place]      Provider Number: [id number]  Attending Physician Name and Address:  Damita Lack, MD  Relative Name and Phone Number:  Graison Leinberger (spouse) Ph: [phone]    Current Level of Care: Hospital Recommended Level of Care: [place] Prior Approval Number:    Date Approved/Denied:   PASRR Number: [id number]  Discharge Plan: SNF    Current Diagnoses: Patient Active Problem List   Diagnosis Date Noted   Elevated troponin 02/09/2022   Hip fracture ([place]) 02/09/2022   Malnutrition of moderate degree 02/09/2022   Closed left hip fracture, initial encounter ([place]) 02/08/2022   Hyperkalemia 02/08/2022   Thoracic aortic aneurysm without rupture ([place]) 06/19/2019   Coronary artery disease of native artery of native heart with stable angina pectoris ([place]) 04/14/2016   Near syncope 02/28/2016   HOH (hard of hearing) 02/08/2016   S/P CABG x 4 02/02/16 02/02/2016   Exertional dyspnea 01/28/2016   Aortic atherosclerosis ([place]) 01/28/2016   Essential hypertension 07/26/2015   History of Rt CA endarterectomy 07/26/2015   Dyslipidemia    Gout 04/11/2015   History of ischemic right MCA stroke 04/11/2015    Orientation RESPIRATION BLADDER Height & Weight     Self, Time, Place  Normal Incontinent Weight: 200 lb (90.7 kg) Height:  '5\' 8"'$  (172.7 cm)  BEHAVIORAL SYMPTOMS/MOOD NEUROLOGICAL BOWEL NUTRITION STATUS   (N/A)  (N/A) Continent Diet (Heart healthy diet)  AMBULATORY STATUS COMMUNICATION OF NEEDS Skin   Extensive Assist Verbally Surgical wounds                       Personal Care Assistance Level of Assistance  Bathing, Feeding, Dressing  Bathing Assistance: Limited assistance Feeding assistance: Limited assistance Dressing Assistance: Maximum assistance     Functional Limitations Info  Sight, Hearing, Speech Sight Info: Adequate Hearing Info: Impaired Speech Info: Adequate    SPECIAL CARE FACTORS FREQUENCY  PT (By licensed PT), OT (By licensed OT)     PT Frequency: 5x's/week OT Frequency: 5x's/week            Contractures Contractures Info: Not present    Additional Factors Info  Code Status, Allergies, Psychotropic Code Status Info: Full Allergies Info: Statins Psychotropic Info: Elavil         Current Medications (02/11/2022):  This is the current hospital active medication list Current Facility-Administered Medications  Medication Dose Route Frequency Provider Last Rate Last Admin   0.9 %  sodium chloride infusion   Intravenous Continuous Rod Can, MD   Stopped at 02/10/22 1615   acetaminophen (TYLENOL) tablet 325-650 mg  325-650 mg Oral Q6H PRN Swinteck, Aaron Edelman, MD       amitriptyline (ELAVIL) tablet 25 mg  25 mg Oral QHS Barb Merino, MD   25 mg at 02/10/22 2129   aspirin EC tablet 325 mg  325 mg Oral Q breakfast Rod Can, MD   325 mg at 02/11/22 0919   docusate sodium (COLACE) capsule 100 mg  100 mg Oral BID Rod Can, MD   100 mg at 02/11/22 0920   feeding supplement (ENSURE ENLIVE / ENSURE PLUS) liquid 237 mL  237 mL Oral BID BM Rod Can, MD  237 mL at 02/11/22 2505   HYDROcodone-acetaminophen (NORCO) 7.5-325 MG per tablet 1-2 tablet  1-2 tablet Oral Q4H PRN Rod Can, MD       HYDROcodone-acetaminophen (NORCO/VICODIN) 5-325 MG per tablet 1-2 tablet  1-2 tablet Oral Q4H PRN Rod Can, MD   2 tablet at 02/11/22 3976   meclizine (ANTIVERT) tablet 25 mg  25 mg Oral QID PRN Swinteck, Aaron Edelman, MD       menthol-cetylpyridinium (CEPACOL) lozenge 3 mg  1 lozenge Oral PRN Swinteck, Aaron Edelman, MD       Or   phenol (CHLORASEPTIC) mouth spray 1 spray  1 spray Mouth/Throat  PRN Swinteck, Aaron Edelman, MD       methocarbamol (ROBAXIN) tablet 500 mg  500 mg Oral Q6H PRN Swinteck, Aaron Edelman, MD       Or   methocarbamol (ROBAXIN) 500 mg in dextrose 5 % 50 mL IVPB  500 mg Intravenous Q6H PRN Swinteck, Aaron Edelman, MD       metoCLOPramide (REGLAN) tablet 5-10 mg  5-10 mg Oral Q8H PRN Swinteck, Aaron Edelman, MD       Or   metoCLOPramide (REGLAN) injection 5-10 mg  5-10 mg Intravenous Q8H PRN Swinteck, Aaron Edelman, MD       metoprolol tartrate (LOPRESSOR) tablet 25 mg  25 mg Oral BID Rod Can, MD   25 mg at 02/11/22 7341   morphine (PF) 2 MG/ML injection 0.5-1 mg  0.5-1 mg Intravenous Q2H PRN Swinteck, Aaron Edelman, MD       multivitamin with minerals tablet 1 tablet  1 tablet Oral Daily Swinteck, Brian, MD   1 tablet at 02/11/22 0920   ondansetron (ZOFRAN) tablet 4 mg  4 mg Oral Q6H PRN Swinteck, Aaron Edelman, MD       Or   ondansetron (ZOFRAN) injection 4 mg  4 mg Intravenous Q6H PRN Swinteck, Aaron Edelman, MD       pantoprazole (PROTONIX) EC tablet 40 mg  40 mg Oral Daily Swinteck, Aaron Edelman, MD   40 mg at 02/11/22 9379   rosuvastatin (CRESTOR) tablet 10 mg  10 mg Oral Daily Rod Can, MD   10 mg at 02/11/22 0240   senna (SENOKOT) tablet 8.6 mg  1 tablet Oral BID Rod Can, MD   8.6 mg at 02/11/22 9735     Discharge Medications: Please see discharge summary for a list of discharge medications.  Relevant Imaging Results:  Relevant Lab Results:   Additional Information SSN: [id number]  Sherie Don, LCSW

## 2022-02-12 DIAGNOSIS — S72002A Fracture of unspecified part of neck of left femur, initial encounter for closed fracture: Secondary | ICD-10-CM | POA: Diagnosis not present

## 2022-02-12 LAB — BASIC METABOLIC PANEL
Anion gap: 6 (ref 5–15)
BUN: 24 mg/dL — ABNORMAL HIGH (ref 8–23)
CO2: 25 mmol/L (ref 22–32)
Calcium: 8.3 mg/dL — ABNORMAL LOW (ref 8.9–10.3)
Chloride: 105 mmol/L (ref 98–111)
Creatinine, Ser: 0.91 mg/dL (ref 0.61–1.24)
GFR, Estimated: >60 mL/min (ref >60)
Glucose, Bld: 148 mg/dL — ABNORMAL HIGH (ref 70–99)
Potassium: 4.4 mmol/L (ref 3.5–5.1)
Sodium: 136 mmol/L (ref 135–145)

## 2022-02-12 LAB — CBC
HCT: 38.1 % — ABNORMAL LOW (ref 39.0–52.0)
Hemoglobin: 12.7 g/dL — ABNORMAL LOW (ref 13.0–17.0)
MCH: 30.1 pg (ref 26.0–34.0)
MCHC: 33.3 g/dL (ref 30.0–36.0)
MCV: 90.3 fL (ref 80.0–100.0)
Platelets: 182 10*3/uL (ref 150–400)
RBC: 4.22 MIL/uL (ref 4.22–5.81)
RDW: 13.9 % (ref 11.5–15.5)
WBC: 9.5 10*3/uL (ref 4.0–10.5)
nRBC: 0 % (ref 0.0–0.2)

## 2022-02-12 MED ORDER — POLYETHYLENE GLYCOL 3350 17 G PO PACK: 17.0000 g | PACK | Freq: Every day | ORAL | Status: DC | PRN

## 2022-02-12 NOTE — Plan of Care (Signed)
  Problem: Education: Goal: Knowledge of General Education information will improve Description: Including pain rating scale, medication(s)/side effects and non-pharmacologic comfort measures Outcome: Progressing   Problem: Activity: Goal: Risk for activity intolerance will decrease Outcome: Progressing   Problem: Pain Managment: Goal: General experience of comfort will improve Outcome: Progressing   Problem: Safety: Goal: Ability to remain free from injury will improve Outcome: Progressing   Problem: Clinical Measurements: Goal: Postoperative complications will be avoided or minimized Outcome: Progressing

## 2022-02-12 NOTE — Plan of Care (Signed)
°  Problem: Clinical Measurements: °Goal: Respiratory complications will improve °Outcome: Progressing °  °Problem: Clinical Measurements: °Goal: Cardiovascular complication will be avoided °Outcome: Progressing °  °Problem: Pain Managment: °Goal: General experience of comfort will improve °Outcome: Progressing °  °Problem: Safety: °Goal: Ability to remain free from injury will improve °Outcome: Progressing °  °

## 2022-02-12 NOTE — TOC Progression Note (Addendum)
Transition of Care [facility]) - Progression Note    Patient Details  Name: Trevor Ward MRN: [id number] Date of Birth: 06-12-1941  Transition of Care [facility]) CM/SW Contact  Ross Ludwig, [place] Phone Number: 02/12/2022, 2:14 PM  Clinical Narrative:     CSW received message from [place] they will review tomorrow and see if a bed can be offered.  TOC to pass information to weekday [facility] worker to follow up with SNF and family.  Expected Discharge Plan: [place] Barriers to Discharge: Continued Medical Work up  Expected Discharge Plan and Services Expected Discharge Plan: [place] In-house Referral: Clinical Social Work   Post Acute Care Choice: [place] Living arrangements for the past 2 months: Single Family Home                 DME Arranged: N/A DME Agency: NA                   Social Determinants of Health (SDOH) Interventions    Readmission Risk Interventions     View : No data to display.

## 2022-02-12 NOTE — Progress Notes (Signed)
PROGRESS NOTE    Trevor Ward  XNT:[id number] DOB: 04-07-1941 DOA: 02/08/2022 PCP: Mayra Neer, MD   Brief Narrative:  81 year old with history of CAD status post CABG, PAD, HTN, thoracic aortic aneurysm presented after of mechanical fall and left hip pain.  Patient was found to have left hip fracture and orthopedic was consulted.  Patient underwent total hip arthroplasty on 5/18.  Postop DVT recommended aspirin.   Assessment & Plan:  Principal Problem:   Closed left hip fracture, initial encounter [facility]) Active Problems:   Essential hypertension   Coronary artery disease of native artery of native heart with stable angina pectoris (HCC)   Thoracic aortic aneurysm without rupture (HCC)   Hyperkalemia   Elevated troponin   Hip fracture (HCC)   Malnutrition of moderate degree    Closed traumatic fracture left hip: S/p total hip -Weightbearing as tolerated with a walker.  Daily aspirin.  Pain control, bowel regimen.   Delirium Off-and-on postoperatively as expected Amitriptyline at bedtime   Coronary artery disease status post CABG Peripheral arterial disease Currently patient is chest pain-free.  Continue high-dose aspirin for now thereafter switch to baby aspirin.  Continue rest of the home medications-Lopressor twice daily, Crestor   Elevated bilirubin Probably Gilbert's syndrome.  Benign.  No indication to intervene.    DVT prophylaxis: Full dose aspirin Code Status: Full code Family Communication: Wife at bedside.   Status is: Inpatient Remains inpatient appropriate because: PT recommending SNF, awaiting bed.   Nutritional status    Signs/Symptoms: moderate fat depletion, moderate muscle depletion  Interventions: Ensure Enlive (each supplement provides 350kcal and 20 grams of protein), MVI  Body mass index is 30.41 kg/m.      Subjective: Feels okay no complaints.  Sitting up in recliner, pain is well controlled.   Examination:  General exam:  Appears calm and comfortable  Respiratory system: Clear to auscultation. Respiratory effort normal. Cardiovascular system: S1 & S2 heard, RRR. No JVD, murmurs, rubs, gallops or clicks. No pedal edema. Gastrointestinal system: Abdomen is nondistended, soft and nontender. No organomegaly or masses felt. Normal bowel sounds heard. Central nervous system: Alert and oriented. No focal neurological deficits. Extremities: Symmetric 5 x 5 power.  Left hip surgical dressing in place.  Limited mobility. Skin: No rashes, lesions or ulcers Psychiatry: Judgement and insight appear normal. Mood & affect appropriate.     Objective: Vitals:   02/11/22 1402 02/11/22 1500 02/11/22 2042 02/12/22 0535  BP: (!) 98/48 (!) 104/58 133/69 131/65  Pulse: 73 75 84 72  Resp: '18  14 14  '$ Temp: 97.6 F (36.4 C)  98.3 F (36.8 C) 98.5 F (36.9 C)  TempSrc: Oral  Oral Oral  SpO2: 93%  93% 97%  Weight:      Height:        Intake/Output Summary (Last 24 hours) at 02/12/2022 0758 Last data filed at 02/12/2022 0731 Gross per 24 hour  Intake 1440.42 ml  Output 250 ml  Net 1190.42 ml   Filed Weights   02/08/22 1407 02/09/22 1310  Weight: 90.7 kg 90.7 kg     Data Reviewed:   CBC: Recent Labs  Lab 02/08/22 1759 02/09/22 0700 02/10/22 0349 02/11/22 0340 02/12/22 0533  WBC 12.8* 12.0* 11.5* 9.8 9.5  NEUTROABS 10.8* 10.3*  --   --   --   HGB 17.3* 16.4 13.2 12.6* 12.7*  HCT 51.7 46.8 38.2* 36.1* 38.1*  MCV 89.1 87.5 89.7 89.1 90.3  PLT 211 209 154 155 182  Basic Metabolic Panel: Recent Labs  Lab 02/08/22 1759 02/09/22 0700 02/10/22 0349 02/11/22 0340 02/12/22 0533  NA 138 134* 134* 139 136  K 5.3* 3.9 4.1 4.1 4.4  CL 104 104 105 107 105  CO2 25 21* '22 27 25  '$ GLUCOSE 115* 165* 176* 127* 148*  BUN 18 22 29* 29* 24*  CREATININE 1.24 1.08 1.18 1.11 0.91  CALCIUM 9.2 8.5* 8.1* 8.3* 8.3*   GFR: Estimated Creatinine Clearance: 67.2 mL/min (by C-G formula based on SCr of 0.91 mg/dL). Liver  Function Tests: Recent Labs  Lab 02/08/22 1759 02/09/22 0700  AST 32 16  ALT 17 13  ALKPHOS 67 58  BILITOT 1.8* 1.8*  PROT 8.3* 7.5  ALBUMIN 4.4 3.9   No results for input(s): LIPASE, AMYLASE in the last 168 hours. No results for input(s): AMMONIA in the last 168 hours. Coagulation Profile: No results for input(s): INR, PROTIME in the last 168 hours. Cardiac Enzymes: No results for input(s): CKTOTAL, CKMB, CKMBINDEX, TROPONINI in the last 168 hours. BNP (last 3 results) No results for input(s): PROBNP in the last 8760 hours. HbA1C: No results for input(s): HGBA1C in the last 72 hours. CBG: Recent Labs  Lab 02/09/22 1646  GLUCAP 140*   Lipid Profile: No results for input(s): CHOL, HDL, LDLCALC, TRIG, CHOLHDL, LDLDIRECT in the last 72 hours. Thyroid Function Tests: No results for input(s): TSH, T4TOTAL, FREET4, T3FREE, THYROIDAB in the last 72 hours. Anemia Panel: No results for input(s): VITAMINB12, FOLATE, FERRITIN, TIBC, IRON, RETICCTPCT in the last 72 hours. Sepsis Labs: No results for input(s): PROCALCITON, LATICACIDVEN in the last 168 hours.  No results found for this or any previous visit (from the past 240 hour(s)).       Radiology Studies: No results found.      Scheduled Meds:  amitriptyline  25 mg Oral QHS   aspirin EC  325 mg Oral Q breakfast   docusate sodium  100 mg Oral BID   feeding supplement  237 mL Oral BID BM   metoprolol tartrate  25 mg Oral BID   multivitamin with minerals  1 tablet Oral Daily   pantoprazole  40 mg Oral Daily   rosuvastatin  10 mg Oral Daily   senna  1 tablet Oral BID   Continuous Infusions:  sodium chloride Stopped (02/10/22 1615)   methocarbamol (ROBAXIN) IV       LOS: 4 days   Time spent= 35 mins    Collen Vincent Arsenio Loader, MD [facility]  If 7PM-7AM, please contact night-coverage  02/12/2022, 7:58 AM

## 2022-02-13 DIAGNOSIS — S72002A Fracture of unspecified part of neck of left femur, initial encounter for closed fracture: Secondary | ICD-10-CM | POA: Diagnosis not present

## 2022-02-13 MED ORDER — SENNA 8.6 MG PO TABS: 1.0000 | ORAL_TABLET | Freq: Every evening | ORAL | 0 refills | Status: DC | PRN

## 2022-02-13 MED ORDER — SODIUM CHLORIDE 0.9 % IV BOLUS
500.0000 mL | Freq: Once | INTRAVENOUS | Status: AC
  Administered 2022-02-13: 500 mL via INTRAVENOUS

## 2022-02-13 MED ORDER — DOCUSATE SODIUM 100 MG PO CAPS: 100.0000 mg | ORAL_CAPSULE | Freq: Two times a day (BID) | ORAL | 0 refills | Status: DC

## 2022-02-13 MED ORDER — POLYETHYLENE GLYCOL 3350 17 G PO PACK: 17.0000 g | PACK | Freq: Every day | ORAL | 0 refills | Status: DC | PRN

## 2022-02-13 NOTE — Care Management Important Message (Signed)
Important Message  Patient Details IM Letter placed in Patients room. Name: MACLIN GUERRETTE MRN: [id number] Date of Birth: 1941/06/20   Medicare Important Message Given:  Yes     Kerin Salen 02/13/2022, 2:41 PM

## 2022-02-13 NOTE — TOC Progression Note (Signed)
Transition of Care [facility]) - Progression Note   Patient Details  Name: Trevor Ward MRN: [id number] Date of Birth: 15-Feb-1941  Transition of Care [facility]) CM/SW [place], LCSW Phone Number: 02/13/2022, 1:19 PM  Clinical Narrative: [place] has made a bed offer, which wife has accepted. Tracey with [facility] has started insurance authorization as patient is not managed by [employer]. Wife aware patient will not be able to transfer to the facility until patient is approved by insurance. TOC awaiting insurance approval.  Expected Discharge Plan: [place] Barriers to Discharge: Continued Medical Work up  Expected Discharge Plan and Services Expected Discharge Plan: [place] In-house Referral: Clinical Social Work Post Acute Care Choice: [place] Living arrangements for the past 2 months: Single Family Home Expected Discharge Date: 02/13/22               DME Arranged: N/A DME Agency: NA  Readmission Risk Interventions     View : No data to display.

## 2022-02-13 NOTE — Progress Notes (Addendum)
Physical Therapy Treatment Patient Details Name: Trevor Ward MRN: [id number] DOB: 15-Dec-1940 Today's Date: 02/13/2022   History of Present Illness 81 y.o. male presenting to the emergency department with left hip pain after a fall.  xray=comminuted left femoral neck fracture; skeletal fx negative. pt is now s/p L DA THA on 02/09/22.  PMH:  CAD, CABG, PAD, HTN, and thoracic aortic aneurysm,    PT Comments    2nd session to attempt gait training. Assessed orthostatic BPs: supine 97/49, sitting 89/52, standing 75/45. Pt c/o feeling "not good" and bil hand tingling. Deferred further mobility and assisted pt back to bed. Made RN and MD aware. Will continue to follow and progress activity as tolerated.     Recommendations for follow up therapy are one component of a multi-disciplinary discharge planning process, led by the attending physician.  Recommendations may be updated based on patient status, additional functional criteria and insurance authorization.  Follow Up Recommendations  Skilled nursing-short term rehab (<3 hours/day)     Assistance Recommended at Discharge Frequent or constant Supervision/Assistance  Patient can return home with the following A lot of help with walking and/or transfers;A lot of help with bathing/dressing/bathroom   Equipment Recommendations  Rolling walker (2 wheels)    Recommendations for Other Services       Precautions / Restrictions Precautions Precautions: Fall Restrictions Weight Bearing Restrictions: No LLE Weight Bearing: Weight bearing as tolerated     Mobility  Bed Mobility Overal bed mobility: Needs Assistance Bed Mobility: Supine to Sit, Sit to Supine     Supine to sit: Mod assist, HOB elevated Sit to supine: Mod assist, HOB elevated   General bed mobility comments: Assist for trunk and bil LEs. Increased time. Cues provided.    Transfers Overall transfer level: Needs assistance Equipment used: Rolling walker (2  wheels) Transfers: Sit to/from Stand Sit to Stand: Min assist, From elevated surface           General transfer comment: Assist to power up, steady, control descent. A minute after standing, pt c/o feeling "not good" and bil hands tingling.  Deferred further mobility. Made MD and RN aware.    Ambulation/Gait                   Stairs             Wheelchair Mobility    Modified Rankin (Stroke Patients Only)       Balance Overall balance assessment: Needs assistance         Standing balance support: Reliant on assistive device for balance, Bilateral upper extremity supported Standing balance-Leahy Scale: Poor                              Cognition Arousal/Alertness: Awake/alert Behavior During Therapy: WFL for tasks assessed/performed Overall Cognitive Status: Within Functional Limits for tasks assessed Area of Impairment: Problem solving                             Problem Solving: Requires verbal cues General Comments: generally wfl. pt is hard of hearing.        Exercises      General Comments        Pertinent Vitals/Pain Pain Assessment Pain Assessment: Faces Faces Pain Scale: Hurts little more Pain Location: L LE Pain Descriptors / Indicators: Discomfort, Sore, Operative site guarding Pain Intervention(s): Limited activity within patient's tolerance,  Monitored during session, Repositioned    Home Living                          Prior Function            PT Goals (current goals can now be found in the care plan section) Progress towards PT goals: Progressing toward goals    Frequency    Min 3X/week      PT Plan Current plan remains appropriate    Co-evaluation              AM-PAC PT "6 Clicks" Mobility   Outcome Measure  Help needed turning from your back to your side while in a flat bed without using bedrails?: A Little Help needed moving from lying on your back to sitting on  the side of a flat bed without using bedrails?: A Little Help needed moving to and from a bed to a chair (including a wheelchair)?: A Lot Help needed standing up from a chair using your arms (e.g., wheelchair or bedside chair)?: A Lot Help needed to walk in hospital room?: A Lot Help needed climbing 3-5 steps with a railing? : A Lot 6 Click Score: 14    End of Session Equipment Utilized During Treatment: Gait belt Activity Tolerance: Patient tolerated treatment well (but limited by drop in BP) Patient left: in bed;with call bell/phone within reach;with bed alarm set;with family/visitor present   PT Visit Diagnosis: Other abnormalities of gait and mobility (R26.89);Difficulty in walking, not elsewhere classified (R26.2)     Time: 2952-8413 PT Time Calculation (min) (ACUTE ONLY): 15 min  Charges:  $Gait Training: 8-22 mins                         Doreatha Massed, PT Acute Rehabilitation  Office: [phone] Pager: [phone]

## 2022-02-13 NOTE — Discharge Summary (Signed)
Physician Discharge Summary  Trevor Ward VOH:[id number] DOB: 1941/04/03 DOA: 02/08/2022  PCP: Mayra Neer, MD  Admit date: 02/08/2022 Discharge date: 02/15/2022  Admitted From: Home Disposition: Home  Recommendations for Outpatient Follow-up:  Follow up with PCP in 1-2 weeks Please obtain BMP/CBC in one week your next doctors visit.  DVT prophylaxis prescribed by orthopedic along with pain medication. ASA BID thereafter Daily.  Bowel regimen prescribed.   Discharge Condition: Stable CODE STATUS: Full code Diet recommendation: Heart healthy  Brief/Interim Summary: 81 year old with history of CAD status post CABG, PAD, HTN, thoracic aortic aneurysm presented after of mechanical fall and left hip pain.  Patient was found to have left hip fracture and orthopedic was consulted.  Patient underwent total hip arthroplasty on 5/18.  Postop DVT recommended aspirin. PT recommended SNF therefore arrangements made.     Assessment & Plan:  Principal Problem:   Closed left hip fracture, initial encounter [facility]) Active Problems:   Essential hypertension   Coronary artery disease of native artery of native heart with stable angina pectoris (HCC)   Thoracic aortic aneurysm without rupture (HCC)   Hyperkalemia   Elevated troponin   Hip fracture (HCC)   Malnutrition of moderate degree     Closed traumatic fracture left hip: S/p total hip -Weightbearing as tolerated with a walker.  ASA BID for DVT prophylaxis thereafter daily.  Pain control, bowel regimen.   Delirium Off-and-on postoperatively as expected Amitriptyline at bedtime   Coronary artery disease status post CABG Peripheral arterial disease Currently patient is chest pain-free.  Continue BID aspirin for now thereafter switch to baby aspirin.  Continue rest of the home medications-Lopressor twice daily, Crestor   Elevated bilirubin Probably Gilbert's syndrome.  Benign.  No indication to intervene.     Assessment and  Plan: No notes have been filed under this hospital service. Service: Hospitalist      Body mass index is 30.41 kg/m.       Discharge Diagnoses:  Principal Problem:   Closed left hip fracture, initial encounter [facility]) Active Problems:   Essential hypertension   Coronary artery disease of native artery of native heart with stable angina pectoris (HCC)   Thoracic aortic aneurysm without rupture (HCC)   Hyperkalemia   Elevated troponin   Hip fracture (HCC)   Malnutrition of moderate degree      Consultations: Ortho  Subjective: No complaints, feels ok. Wife at bedside.   Discharge Exam: Vitals:   02/14/22 2047 02/15/22 0521  BP: (!) 146/76 140/69  Pulse: 85 82  Resp: 17 17  Temp: (!) 97.4 F (36.3 C) (!) 97.4 F (36.3 C)  SpO2: 99% 97%   Vitals:   02/14/22 0907 02/14/22 1720 02/14/22 2047 02/15/22 0521  BP: 126/60 126/70 (!) 146/76 140/69  Pulse: 94 94 85 82  Resp: '18 18 17 17  '$ Temp: 98.2 F (36.8 C) 97.9 F (36.6 C) (!) 97.4 F (36.3 C) (!) 97.4 F (36.3 C)  TempSrc: Oral Oral Oral   SpO2: 97% 99% 99% 97%  Weight:      Height:        General: Pt is alert, awake, not in acute distress Cardiovascular: RRR, S1/S2 +, no rubs, no gallops Respiratory: CTA bilaterally, no wheezing, no rhonchi Abdominal: Soft, NT, ND, bowel sounds + Extremities: no edema, no cyanosis  Discharge Instructions   Allergies as of 02/15/2022       Reactions   Statins Other (See Comments)   Severe myalgias  Medication List     STOP taking these medications    aspirin EC 81 MG tablet Replaced by: aspirin 81 MG chewable tablet       TAKE these medications    allopurinol 100 MG tablet Commonly known as: ZYLOPRIM Take 100 mg by mouth daily.   amitriptyline 25 MG tablet Commonly known as: ELAVIL Take 25 mg by mouth at bedtime.   aspirin 81 MG chewable tablet Commonly known as: Aspirin Childrens Chew 1 tablet (81 mg total) by mouth 2 (two) times  daily with a meal. Replaces: aspirin EC 81 MG tablet   buPROPion 300 MG 24 hr tablet Commonly known as: WELLBUTRIN XL   colchicine 0.6 MG tablet   docusate sodium 100 MG capsule Commonly known as: COLACE Take 1 capsule (100 mg total) by mouth 2 (two) times daily.   Glucosamine Sulfate 1000 MG Tabs Take 2,000 mg by mouth daily.   HYDROcodone-acetaminophen 10-325 MG tablet Commonly known as: NORCO Take 0.5 tablets by mouth every 4 (four) hours as needed for up to 7 days for moderate pain or severe pain.   meclizine 25 MG tablet Commonly known as: ANTIVERT Take 25 mg by mouth 4 (four) times daily as needed for dizziness.   metoprolol tartrate 25 MG tablet Commonly known as: LOPRESSOR TAKE 1 TABLET TWO TIMES DAILY. MAKE OFFICE VISIT FOR MORE REFILLS. What changed: See the new instructions.   pantoprazole 40 MG tablet Commonly known as: PROTONIX Take 1 tablet (40 mg total) by mouth daily.   polyethylene glycol 17 g packet Commonly known as: MIRALAX / GLYCOLAX Take 17 g by mouth daily as needed for moderate constipation or severe constipation.   rosuvastatin 10 MG tablet Commonly known as: CRESTOR Take 10 mg by mouth daily.   senna 8.6 MG Tabs tablet Commonly known as: SENOKOT Take 1 tablet (8.6 mg total) by mouth at bedtime as needed for mild constipation.   vitamin B-12 500 MCG tablet Commonly known as: CYANOCOBALAMIN Take 500 mcg by mouth daily.        Contact information for follow-up providers     Swinteck, Aaron Edelman, MD. Schedule an appointment as soon as possible for a visit in 2 week(s).   Specialty: Orthopedic Surgery Why: For wound re-check, For suture removal Contact information: [address] [place] [address] [phone]         Mayra Neer, MD Follow up in 1 week(s).   Specialty: Family Medicine Contact information: [address] [place] [address] [phone]              Contact information  for after-discharge care     Destination     [facility] Preferred SNF .   Service: Skilled Nursing Contact information: [place] [phone]                    Allergies  Allergen Reactions   Statins Other (See Comments)    Severe myalgias    You were cared for by a hospitalist during your hospital stay. If you have any questions about your discharge medications or the care you received while you were in the hospital after you are discharged, you can call the unit and asked to speak with the hospitalist on call if the hospitalist that took care of you is not available. Once you are discharged, your primary care physician will handle any further medical issues. Please note that no refills  for any discharge medications will be authorized once you are discharged, as it is imperative that you return to your primary care physician (or establish a relationship with a primary care physician if you do not have one) for your aftercare needs so that they can reassess your need for medications and monitor your lab values.   Procedures/Studies: CT Head Wo Contrast  Result Date: 02/08/2022 CLINICAL DATA:  Fall. EXAM: CT HEAD WITHOUT CONTRAST CT CERVICAL SPINE WITHOUT CONTRAST TECHNIQUE: Multidetector CT imaging of the head and cervical spine was performed following the standard protocol without intravenous contrast. Multiplanar CT image reconstructions of the cervical spine were also generated. RADIATION DOSE REDUCTION: This exam was performed according to the departmental dose-optimization program which includes automated exposure control, adjustment of the mA and/or kV according to patient size and/or use of iterative reconstruction technique. COMPARISON:  CT head 04/11/2015 FINDINGS: CT HEAD FINDINGS Brain: Negative for acute infarct, hemorrhage, mass. Progressive atrophy and progressive chronic microvascular ischemic change in  the white matter compared with 2016. Vascular: Negative for hyperdense vessel. Skull: Negative Sinuses/Orbits: Paranasal sinuses clear.  No orbital lesion. Other: None CT CERVICAL SPINE FINDINGS Alignment: Mild anterolisthesis C3-4. Straightening of the cervical lordosis. Skull base and vertebrae: Negative for fracture Soft tissues and spinal canal: No acute soft tissue abnormality. Disc levels: Multilevel disc and facet degeneration. Disc degeneration most prominent at C4-5, C5-6, C6-7. Moderate left foraminal narrowing C5-6 and C6-7 due to spurring. Upper chest: Lung apices clear bilaterally Other: None IMPRESSION: 1. No acute intracranial abnormality progressive atrophy and chronic microvascular ischemic change since 2016 2. Negative for cervical spine fracture. Moderate cervical spondylosis. Electronically Signed   By: Franchot Gallo M.D.   On: 02/08/2022 18:42   CT Cervical Spine Wo Contrast  Result Date: 02/08/2022 CLINICAL DATA:  Fall. EXAM: CT HEAD WITHOUT CONTRAST CT CERVICAL SPINE WITHOUT CONTRAST TECHNIQUE: Multidetector CT imaging of the head and cervical spine was performed following the standard protocol without intravenous contrast. Multiplanar CT image reconstructions of the cervical spine were also generated. RADIATION DOSE REDUCTION: This exam was performed according to the departmental dose-optimization program which includes automated exposure control, adjustment of the mA and/or kV according to patient size and/or use of iterative reconstruction technique. COMPARISON:  CT head 04/11/2015 FINDINGS: CT HEAD FINDINGS Brain: Negative for acute infarct, hemorrhage, mass. Progressive atrophy and progressive chronic microvascular ischemic change in the white matter compared with 2016. Vascular: Negative for hyperdense vessel. Skull: Negative Sinuses/Orbits: Paranasal sinuses clear.  No orbital lesion. Other: None CT CERVICAL SPINE FINDINGS Alignment: Mild anterolisthesis C3-4. Straightening of the  cervical lordosis. Skull base and vertebrae: Negative for fracture Soft tissues and spinal canal: No acute soft tissue abnormality. Disc levels: Multilevel disc and facet degeneration. Disc degeneration most prominent at C4-5, C5-6, C6-7. Moderate left foraminal narrowing C5-6 and C6-7 due to spurring. Upper chest: Lung apices clear bilaterally Other: None IMPRESSION: 1. No acute intracranial abnormality progressive atrophy and chronic microvascular ischemic change since 2016 2. Negative for cervical spine fracture. Moderate cervical spondylosis. Electronically Signed   By: Franchot Gallo M.D.   On: 02/08/2022 18:42   Pelvis Portable  Result Date: 02/09/2022 CLINICAL DATA:  Left femoral neck fracture status post hip replacement EXAM: PORTABLE PELVIS 1-2 VIEWS COMPARISON:  02/08/2022 FINDINGS: Frontal view of the pelvis and both hips excludes the iliac crests by collimation. Interval placement of a left hip arthroplasty in the expected position without signs of acute complication. Postsurgical changes are seen in the soft tissues  overlying the left hip. IMPRESSION: 1. Unremarkable left hip arthroplasty. Electronically Signed   By: Randa Ngo M.D.   On: 02/09/2022 17:01   DG Chest Portable 1 View  Result Date: 02/08/2022 CLINICAL DATA:  Hip fracture EXAM: PORTABLE CHEST 1 VIEW COMPARISON:  Chest x-ray 10/17/2021.  Chest CT 07/18/2021 FINDINGS: Postop CABG. Heart size within normal limits. Negative for heart failure. Mild bibasilar airspace disease likely atelectasis.  No effusion. Bilateral calcified pleural plaques. IMPRESSION: Mild bibasilar atelectasis. Calcified pleural plaques bilaterally. Electronically Signed   By: Franchot Gallo M.D.   On: 02/08/2022 18:15   DG Knee Left Port  Result Date: 02/09/2022 CLINICAL DATA:  Known fracture of the left hip. EXAM: PORTABLE LEFT KNEE - 1-2 VIEW COMPARISON:  None Available. FINDINGS: No evidence of fracture, dislocation, or joint effusion. Tricompartmental  joint space narrowing prominent in the medial tibiofemoral and patellofemoral compartments with marginal osteophytes. Multiple vascular clips along the medial aspect of the knee. IMPRESSION: 1. No evidence of fracture or dislocation. 2. Moderate knee osteoarthritis prominent in the medial tibiofemoral and patellofemoral compartments. Electronically Signed   By: Keane Police D.O.   On: 02/09/2022 08:42   DG C-Arm 1-60 Min-No Report  Result Date: 02/09/2022 Fluoroscopy was utilized by the requesting physician.  No radiographic interpretation.   DG HIP UNILAT WITH PELVIS 1V LEFT  Result Date: 02/09/2022 CLINICAL DATA:  Left total hip replacement, intraoperative images EXAM: DG HIP (WITH OR WITHOUT PELVIS) 1V*L*; DG C-ARM 1-60 MIN-NO REPORT COMPARISON:  Left hip radiographs 02/08/2022 FINDINGS: Surgical changes of left hip arthroplasty. No evidence of immediate hardware complication. IMPRESSION: Intraoperative images demonstrate left hip arthroplasty without evidence of immediate hardware complication. Electronically Signed   By: Jacqulynn Cadet M.D.   On: 02/09/2022 16:20   DG Hip Unilat With Pelvis 2-3 Views Left  Result Date: 02/08/2022 CLINICAL DATA:  Trauma, fall EXAM: DG HIP (WITH OR WITHOUT PELVIS) 2-3V LEFT COMPARISON:  None Available. FINDINGS: There is comminuted fracture in the neck of left femur. There is no significant displacement or angulation at the fracture site. Degenerative changes are noted in the visualized lumbar spine. Vascular calcifications are seen in the soft tissues. Surgical clips are seen in the left upper thigh. IMPRESSION: Comminuted, essentially undisplaced fracture is seen in the neck of left femur. Electronically Signed   By: Elmer Picker M.D.   On: 02/08/2022 16:59     The results of significant diagnostics from this hospitalization (including imaging, microbiology, ancillary and laboratory) are listed below for reference.     Microbiology: No results found  for this or any previous visit (from the past 240 hour(s)).   Labs: BNP (last 3 results) No results for input(s): BNP in the last 8760 hours. Basic Metabolic Panel: Recent Labs  Lab 02/08/22 1759 02/09/22 0700 02/10/22 0349 02/11/22 0340 02/12/22 0533  NA 138 134* 134* 139 136  K 5.3* 3.9 4.1 4.1 4.4  CL 104 104 105 107 105  CO2 25 21* '22 27 25  '$ GLUCOSE 115* 165* 176* 127* 148*  BUN 18 22 29* 29* 24*  CREATININE 1.24 1.08 1.18 1.11 0.91  CALCIUM 9.2 8.5* 8.1* 8.3* 8.3*   Liver Function Tests: Recent Labs  Lab 02/08/22 1759 02/09/22 0700  AST 32 16  ALT 17 13  ALKPHOS 67 58  BILITOT 1.8* 1.8*  PROT 8.3* 7.5  ALBUMIN 4.4 3.9   No results for input(s): LIPASE, AMYLASE in the last 168 hours. No results for input(s): AMMONIA in the  last 168 hours. CBC: Recent Labs  Lab 02/08/22 1759 02/09/22 0700 02/10/22 0349 02/11/22 0340 02/12/22 0533  WBC 12.8* 12.0* 11.5* 9.8 9.5  NEUTROABS 10.8* 10.3*  --   --   --   HGB 17.3* 16.4 13.2 12.6* 12.7*  HCT 51.7 46.8 38.2* 36.1* 38.1*  MCV 89.1 87.5 89.7 89.1 90.3  PLT 211 209 154 155 182   Cardiac Enzymes: No results for input(s): CKTOTAL, CKMB, CKMBINDEX, TROPONINI in the last 168 hours. BNP: Invalid input(s): POCBNP CBG: Recent Labs  Lab 02/09/22 1646  GLUCAP 140*   D-Dimer No results for input(s): DDIMER in the last 72 hours. Hgb A1c No results for input(s): HGBA1C in the last 72 hours. Lipid Profile No results for input(s): CHOL, HDL, LDLCALC, TRIG, CHOLHDL, LDLDIRECT in the last 72 hours. Thyroid function studies No results for input(s): TSH, T4TOTAL, T3FREE, THYROIDAB in the last 72 hours.  Invalid input(s): FREET3 Anemia work up No results for input(s): VITAMINB12, FOLATE, FERRITIN, TIBC, IRON, RETICCTPCT in the last 72 hours. Urinalysis    Component Value Date/Time   COLORURINE YELLOW 02/09/2022 0700   APPEARANCEUR HAZY (A) 02/09/2022 0700   LABSPEC 1.015 02/09/2022 0700   PHURINE 5.0 02/09/2022  0700   GLUCOSEU NEGATIVE 02/09/2022 0700   HGBUR SMALL (A) 02/09/2022 0700   BILIRUBINUR NEGATIVE 02/09/2022 0700   KETONESUR 20 (A) 02/09/2022 0700   PROTEINUR NEGATIVE 02/09/2022 0700   UROBILINOGEN 0.2 04/11/2015 1711   NITRITE POSITIVE (A) 02/09/2022 0700   LEUKOCYTESUR LARGE (A) 02/09/2022 0700   Sepsis Labs Invalid input(s): PROCALCITONIN,  WBC,  LACTICIDVEN Microbiology No results found for this or any previous visit (from the past 240 hour(s)).   Time coordinating discharge:  I have spent 35 minutes face to face with the patient and on the ward discussing the patients care, assessment, plan and disposition with other care givers. >50% of the time was devoted counseling the patient about the risks and benefits of treatment/Discharge disposition and coordinating care.   SIGNED:   Damita Lack, MD  [facility] 02/15/2022, 9:41 AM   If 7PM-7AM, please contact night-coverage

## 2022-02-13 NOTE — Progress Notes (Signed)
Physical Therapy Treatment Patient Details Name: Trevor Ward MRN: [id number] DOB: 1941/02/05 Today's Date: 02/13/2022   History of Present Illness 81 y.o. male presenting to the emergency department with left hip pain after a fall.  xray=comminuted left femoral neck fracture; skeletal fx negative. pt is now s/p L DA THA on 02/09/22.  PMH:  CAD, CABG, PAD, HTN, and thoracic aortic aneurysm,    PT Comments    Pt agreeable to therapy. He was already sitting up in recliner. Assisted pt into standing to ambulate however unable to due to pt c/o dizziness. BP 67/55 once seated in recliner. Deferred further mobility at this time. Made RN and MD aware. Plan is for ST SNF for continued rehab.     Recommendations for follow up therapy are one component of a multi-disciplinary discharge planning process, led by the attending physician.  Recommendations may be updated based on patient status, additional functional criteria and insurance authorization.  Follow Up Recommendations  Skilled nursing-short term rehab (<3 hours/day)     Assistance Recommended at Discharge Frequent or constant Supervision/Assistance  Patient can return home with the following A lot of help with walking and/or transfers;A lot of help with bathing/dressing/bathroom   Equipment Recommendations  Rolling walker (2 wheels)    Recommendations for Other Services       Precautions / Restrictions Precautions Precautions: Fall Restrictions Weight Bearing Restrictions: No LLE Weight Bearing: Weight bearing as tolerated     Mobility  Bed Mobility               General bed mobility comments: oob in recliner    Transfers Overall transfer level: Needs assistance Equipment used: Rolling walker (2 wheels) Transfers: Sit to/from Stand Sit to Stand: Min assist, +2 safety/equipment, +2 physical assistance           General transfer comment: Assist to power up, steady, control descent. A minute or two after  standing, pt c/o dizziness and had to sit down. Assesed BP 67/55. Deferred further mobility. Made RN aware.    Ambulation/Gait                   Stairs             Wheelchair Mobility    Modified Rankin (Stroke Patients Only)       Balance Overall balance assessment: History of Falls, Needs assistance         Standing balance support: Reliant on assistive device for balance, Bilateral upper extremity supported Standing balance-Leahy Scale: Poor                              Cognition Arousal/Alertness: Awake/alert Behavior During Therapy: WFL for tasks assessed/performed Overall Cognitive Status: Impaired/Different from baseline Area of Impairment: Problem solving                             Problem Solving: Requires verbal cues General Comments: generally wfl. pt is hard of hearing.        Exercises      General Comments        Pertinent Vitals/Pain Pain Assessment Pain Assessment: Faces Faces Pain Scale: Hurts little more Pain Location: L LE Pain Descriptors / Indicators: Discomfort, Sore Pain Intervention(s): Limited activity within patient's tolerance, Monitored during session, Repositioned    Home Living  Prior Function            PT Goals (current goals can now be found in the care plan section) Progress towards PT goals: Progressing toward goals    Frequency    Min 3X/week      PT Plan Current plan remains appropriate    Co-evaluation              AM-PAC PT "6 Clicks" Mobility   Outcome Measure  Help needed turning from your back to your side while in a flat bed without using bedrails?: A Little Help needed moving from lying on your back to sitting on the side of a flat bed without using bedrails?: A Little Help needed moving to and from a bed to a chair (including a wheelchair)?: A Lot Help needed standing up from a chair using your arms (e.g.,  wheelchair or bedside chair)?: A Little Help needed to walk in hospital room?: A Lot Help needed climbing 3-5 steps with a railing? : A Lot 6 Click Score: 15    End of Session Equipment Utilized During Treatment: Gait belt Activity Tolerance: Patient tolerated treatment well Patient left: in chair;with call bell/phone within reach;with family/visitor present Nurse Communication:  (drop in BP with standing) PT Visit Diagnosis: Other abnormalities of gait and mobility (R26.89);Difficulty in walking, not elsewhere classified (R26.2)     Time: 6720-9470 PT Time Calculation (min) (ACUTE ONLY): 13 min  Charges:  $Therapeutic Activity: 8-22 mins                     Doreatha Massed, PT Acute Rehabilitation  Office: [phone] Pager: [phone]

## 2022-02-14 MED ORDER — SODIUM CHLORIDE 0.9 % IV BOLUS
500.0000 mL | Freq: Once | INTRAVENOUS | Status: AC
  Administered 2022-02-14: 500 mL via INTRAVENOUS

## 2022-02-14 NOTE — Progress Notes (Signed)
Occupational Therapy Treatment Patient Details Name: Trevor Ward MRN: [id number] DOB: 04/06/1941 Today's Date: 02/14/2022   History of present illness 81 y.o. male presenting to the emergency department with left hip pain after a fall.  xray=comminuted left femoral neck fracture; skeletal fx negative. pt is now s/p L DA THA on 02/09/22.  PMH:  CAD, CABG, PAD, HTN, and thoracic aortic aneurysm,   OT comments  Pt overall min assist for supine to sit and sit to supine.  His BP in sitting was at 100/59 and then decreased to 90/56 with standing with pt reporting some slight dizziness and nausea.  Feel he will continue to benefit from acute care OT with transition to SNF once bed is available.  Will continue to follow for acute care.     Recommendations for follow up therapy are one component of a multi-disciplinary discharge planning process, led by the attending physician.  Recommendations may be updated based on patient status, additional functional criteria and insurance authorization.    Follow Up Recommendations  Skilled nursing-short term rehab (<3 hours/day)    Assistance Recommended at Discharge Frequent or constant Supervision/Assistance  Patient can return home with the following  A lot of help with walking and/or transfers;A lot of help with bathing/dressing/bathroom;Direct supervision/assist for financial management;Direct supervision/assist for medications management;Assistance with cooking/housework;Assist for transportation   Equipment Recommendations  Other (comment) (defer to SNF)       Precautions / Restrictions Precautions Precautions: Fall Restrictions Weight Bearing Restrictions: No LLE Weight Bearing: Weight bearing as tolerated       Mobility Bed Mobility Overal bed mobility: Needs Assistance Bed Mobility: Supine to Sit, Sit to Supine     Supine to sit: Mod assist Sit to supine: Min assist   General bed mobility comments: Assist needed for bringing trunk  up to sitting for OOB with assist to lift the LLE into the bed when laying down.    Transfers Overall transfer level: Needs assistance Equipment used: Rolling walker (2 wheels) Transfers: Sit to/from Stand, Bed to chair/wheelchair/BSC Sit to Stand: Min assist     Step pivot transfers: Min assist           Balance Overall balance assessment: Needs assistance Sitting-balance support: Feet supported, Bilateral upper extremity supported Sitting balance-Leahy Scale: Good     Standing balance support: Reliant on assistive device for balance, During functional activity Standing balance-Leahy Scale: Poor Standing balance comment: Pt needs UE support for balance                           ADL either performed or assessed with clinical judgement   ADL Overall ADL's : Needs assistance/impaired                     Lower Body Dressing: Minimal assistance;Sit to/from stand Lower Body Dressing Details (indicate cue type and reason): Pt able to donn both gripper socks with min assist for the left only to pull over the heel. Toilet Transfer: Minimal assistance;Rolling walker (2 wheels);Regular Toilet;Grab bars   Toileting- Clothing Manipulation and Hygiene: Minimal assistance;Sit to/from stand Toileting - Clothing Manipulation Details (indicate cue type and reason): simulated     Functional mobility during ADLs: Minimal assistance;Rolling walker (2 wheels) General ADL Comments: Pt in bed to start with BP at 111/75.  He transitioned to sitting with min assist with BP again at 100/59.  No reports of dizziness in sitting.  He agreed to ambulate to  the bathroom for toileting and grooming tasks with use of the RW.  While resting on the toilet, he reported increased nausea and not feeling well.  Had him complete functional mobility back to the bed where he remained standing for checking BP at 90/56.  Min assist for transition back to the bed.               Cognition  Arousal/Alertness: Awake/alert Behavior During Therapy: WFL for tasks assessed/performed Overall Cognitive Status: Within Functional Limits for tasks assessed Area of Impairment: Problem solving                             Problem Solving: Requires verbal cues General Comments: Pt hard of hearing                   Pertinent Vitals/ Pain       Pain Assessment Pain Assessment: Faces Faces Pain Scale: Hurts little more Pain Location: L LE Pain Descriptors / Indicators: Discomfort, Sore, Operative site guarding         Frequency  Min 2X/week        Progress Toward Goals  OT Goals(current goals can now be found in the care plan section)  Progress towards OT goals: Progressing toward goals  Acute Rehab OT Goals Patient Stated Goal: Pt did not state but agreeable to completing OT session. OT Goal Formulation: With patient/family Time For Goal Achievement: 02/24/22 Potential to Achieve Goals: Good  Plan Discharge plan remains appropriate       AM-PAC OT "6 Clicks" Daily Activity     Outcome Measure   Help from another person eating meals?: None Help from another person taking care of personal grooming?: A Little Help from another person toileting, which includes using toliet, bedpan, or urinal?: A Little Help from another person bathing (including washing, rinsing, drying)?: A Little Help from another person to put on and taking off regular upper body clothing?: None Help from another person to put on and taking off regular lower body clothing?: A Little 6 Click Score: 20    End of Session Equipment Utilized During Treatment: Gait belt;Rolling walker (2 wheels)  OT Visit Diagnosis: Unsteadiness on feet (R26.81);Other symptoms and signs involving cognitive function;History of falling (Z91.81)   Activity Tolerance Other (comment) (pt limited by feeling nauses)   Patient Left in bed;with call bell/phone within reach;with bed alarm set;with  family/visitor present   Nurse Communication          Time: 5643-3295 OT Time Calculation (min): 39 min  Charges: OT General Charges $OT Visit: 1 Visit OT Treatments $Self Care/Home Management : 38-52 mins  Dayron Odland OTR/L 02/14/2022, 1:03 PM

## 2022-02-14 NOTE — TOC Progression Note (Signed)
Transition of Care [facility]) - Progression Note   Patient Details  Name: Trevor Ward MRN: [id number] Date of Birth: Apr 10, 1941  [facility] CM/SW [place], LCSW Phone Number: 02/14/2022, 11:33 AM  Clinical Narrative: CSW followed up with Linus Orn at [facility] and insurance authorization is still pending. Treatment team updated.  Expected Discharge Plan: [place] Barriers to Discharge: Continued Medical Work up  Expected Discharge Plan and Services Expected Discharge Plan: [place] In-house Referral: Clinical Social Work Post Acute Care Choice: [place] Living arrangements for the past 2 months: Single Family Home Expected Discharge Date: 02/14/22               DME Arranged: N/A DME Agency: NA  Readmission Risk Interventions     View : No data to display.

## 2022-02-14 NOTE — Progress Notes (Signed)
Physical Therapy Treatment Patient Details Name: Trevor Ward MRN: [id number] DOB: December 14, 1940 Today's Date: 02/14/2022   History of Present Illness 81 y.o. male presenting to the emergency department with left hip pain after a fall.  xray=comminuted left femoral neck fracture; skeletal fx negative. pt is now s/p L DA THA on 02/09/22.  PMH:  CAD, CABG, PAD, HTN, and thoracic aortic aneurysm,    PT Comments    Pt agreeable to working with PT. BP semi-reclined 127/68. Assisted pt onto bsc at his request. One instance of LE buckling during step pivot. BP 116/95 after pivot to bsc. Pt continues to report not feeling well generally-dizziness and nausea. Will continue to follow pt during hospital stay. Plan is for ST SNF rehab.     Recommendations for follow up therapy are one component of a multi-disciplinary discharge planning process, led by the attending physician.  Recommendations may be updated based on patient status, additional functional criteria and insurance authorization.  Follow Up Recommendations  Skilled nursing-short term rehab (<3 hours/day)     Assistance Recommended at Discharge Frequent or constant Supervision/Assistance  Patient can return home with the following A lot of help with walking and/or transfers;A little help with bathing/dressing/bathroom   Equipment Recommendations  Rolling walker (2 wheels)    Recommendations for Other Services       Precautions / Restrictions Precautions Precautions: Fall Restrictions Weight Bearing Restrictions: No LLE Weight Bearing: Weight bearing as tolerated     Mobility  Bed Mobility               General bed mobility comments: oob in recliner    Transfers Overall transfer level: Needs assistance Equipment used: Rolling walker (2 wheels) Transfers: Sit to/from Stand, Bed to chair/wheelchair/BSC Sit to Stand: Min assist   Step pivot transfers: Min assist       General transfer comment: Assist to power up,  steady, control descent. Cues required. Increased time. Pt had one instance of LE buckling during step pivot over to recliner. He continues to report not feeling well. BP 116/95 after pivot to recliner    Ambulation/Gait                   Stairs             Wheelchair Mobility    Modified Rankin (Stroke Patients Only)       Balance Overall balance assessment: Needs assistance         Standing balance support: Reliant on assistive device for balance, Bilateral upper extremity supported, During functional activity Standing balance-Leahy Scale: Poor Standing balance comment: LT knee buckles                            Cognition Arousal/Alertness: Awake/alert Behavior During Therapy: WFL for tasks assessed/performed Overall Cognitive Status: Within Functional Limits for tasks assessed                                          Exercises      General Comments        Pertinent Vitals/Pain Pain Assessment Pain Assessment: 0-10 Faces Pain Scale: Hurts little more Pain Location: L LE Pain Descriptors / Indicators: Discomfort, Sore, Operative site guarding Pain Intervention(s): Limited activity within patient's tolerance, Monitored during session, Repositioned    Home Living  Prior Function            PT Goals (current goals can now be found in the care plan section) Progress towards PT goals: Progressing toward goals    Frequency    Min 3X/week      PT Plan Current plan remains appropriate    Co-evaluation              AM-PAC PT "6 Clicks" Mobility   Outcome Measure  Help needed turning from your back to your side while in a flat bed without using bedrails?: A Little Help needed moving from lying on your back to sitting on the side of a flat bed without using bedrails?: A Little Help needed moving to and from a bed to a chair (including a wheelchair)?: A Little Help needed  standing up from a chair using your arms (e.g., wheelchair or bedside chair)?: A Little Help needed to walk in hospital room?: A Lot Help needed climbing 3-5 steps with a railing? : A Lot 6 Click Score: 16    End of Session Equipment Utilized During Treatment: Gait belt Activity Tolerance: Patient tolerated treatment well Patient left: with call bell/phone within reach;with family/visitor present (on bsc-trying to have a bowel movement) Nurse Communication:  (asked NT to assist pt off of bsc when he calls out) PT Visit Diagnosis: Other abnormalities of gait and mobility (R26.89);Difficulty in walking, not elsewhere classified (R26.2)     Time: 9562-1308 PT Time Calculation (min) (ACUTE ONLY): 30 min  Charges:  $Therapeutic Activity: 23-37 mins                        Doreatha Massed, PT Acute Rehabilitation  Office: [phone] Pager: [phone]

## 2022-02-14 NOTE — Progress Notes (Signed)
Patient seen and examined at bedside, reports of slight dizziness especially with changing position.  Somewhat poor oral intake in last 24 hours.  Otherwise no acute events.  Wife is present at bedside and he is getting ready to work with physical therapy.  Overall his vital signs are stable including blood pressure.  We will give him gentle 500 cc bolus and encourage oral intake for the rest of the day  Discharge summary completed on 02/13/2022 and updated today.  Otherwise patient remains medically stable to be discharged to rehab when bed is available.  Gerlean Ren MD [facility]

## 2022-02-15 DIAGNOSIS — I959 Hypotension, unspecified: Secondary | ICD-10-CM | POA: Diagnosis not present

## 2022-02-15 DIAGNOSIS — W19XXXD Unspecified fall, subsequent encounter: Secondary | ICD-10-CM | POA: Diagnosis not present

## 2022-02-15 DIAGNOSIS — M109 Gout, unspecified: Secondary | ICD-10-CM | POA: Diagnosis not present

## 2022-02-15 DIAGNOSIS — I739 Peripheral vascular disease, unspecified: Secondary | ICD-10-CM | POA: Diagnosis not present

## 2022-02-15 DIAGNOSIS — K5900 Constipation, unspecified: Secondary | ICD-10-CM | POA: Diagnosis not present

## 2022-02-15 DIAGNOSIS — W19XXXA Unspecified fall, initial encounter: Secondary | ICD-10-CM | POA: Diagnosis not present

## 2022-02-15 DIAGNOSIS — F32A Depression, unspecified: Secondary | ICD-10-CM | POA: Diagnosis not present

## 2022-02-15 DIAGNOSIS — I10 Essential (primary) hypertension: Secondary | ICD-10-CM | POA: Diagnosis not present

## 2022-02-15 DIAGNOSIS — K219 Gastro-esophageal reflux disease without esophagitis: Secondary | ICD-10-CM | POA: Diagnosis not present

## 2022-02-15 DIAGNOSIS — Z96642 Presence of left artificial hip joint: Secondary | ICD-10-CM | POA: Diagnosis not present

## 2022-02-15 DIAGNOSIS — S72002D Fracture of unspecified part of neck of left femur, subsequent encounter for closed fracture with routine healing: Secondary | ICD-10-CM | POA: Diagnosis not present

## 2022-02-15 DIAGNOSIS — Z7401 Bed confinement status: Secondary | ICD-10-CM | POA: Diagnosis not present

## 2022-02-15 DIAGNOSIS — Z79899 Other long term (current) drug therapy: Secondary | ICD-10-CM | POA: Diagnosis not present

## 2022-02-15 NOTE — Progress Notes (Signed)
Patient ID: Trevor Ward, male   DOB: [birth year], 81 y.o.   MRN: [id number]   An After Visit Summary was printed and placed in packet. Report called to [place] [facility].  No further questions at this time. Awaiting PTAR trasnport.  Haydee Salter, RN

## 2022-02-15 NOTE — TOC Transition Note (Signed)
Transition of Care [facility]) - CM/SW Discharge Note  Patient Details  Name: Trevor Ward MRN: [id number] Date of Birth: [birth year]  Transition of Care [facility]) CM/SW Contact:  Trevor Don, LCSW Phone Number: 02/15/2022, 12:41 PM  Clinical Narrative: CSW notified by Linus Orn with [facility] that insurance has approved patient for SNF. Discharge summary, discharge orders, and SNF transfer report faxed to facility in hub. The number for report is [phone]. Medical necessity form done; PTAR scheduled. CSW notified wife of discharge. RN updated. TOC signing off.    Final next level of care: Skilled Nursing Facility Barriers to Discharge: Barriers Resolved  Patient Goals and CMS Choice Patient states their goals for this hospitalization and ongoing recovery are:: Go to [facility] for rehab CMS Medicare.gov Compare Post Acute Care list provided to:: Patient Represenative (must comment) Choice offered to / list presented to : Patient, Spouse  Discharge Placement PASRR number recieved: 02/11/22        Patient chooses bed at: [place] Patient to be transferred to facility by: [place] Name of family member notified: Trevor Ward (wife) Patient and family notified of of transfer: 02/15/22  Discharge Plan and Services In-house Referral: Clinical Social Work Post Acute Care Choice: [place]          DME Arranged: N/A DME Agency: NA  Readmission Risk Interventions     View : No data to display.

## 2022-02-15 NOTE — Plan of Care (Signed)
Patient ID: Trevor Ward, male   DOB: Mar 20, 1941, 81 y.o.   MRN: [id number]  Problem: Education: Goal: Knowledge of General Education information will improve Description: Including pain rating scale, medication(s)/side effects and non-pharmacologic comfort measures Outcome: Adequate for Discharge   Problem: Health Behavior/Discharge Planning: Goal: Ability to manage health-related needs will improve Outcome: Adequate for Discharge   Problem: Clinical Measurements: Goal: Ability to maintain clinical measurements within normal limits will improve Outcome: Adequate for Discharge Goal: Will remain free from infection Outcome: Adequate for Discharge Goal: Diagnostic test results will improve Outcome: Adequate for Discharge Goal: Respiratory complications will improve Outcome: Adequate for Discharge Goal: Cardiovascular complication will be avoided Outcome: Adequate for Discharge   Problem: Activity: Goal: Risk for activity intolerance will decrease Outcome: Adequate for Discharge   Problem: Nutrition: Goal: Adequate nutrition will be maintained Outcome: Adequate for Discharge   Problem: Coping: Goal: Level of anxiety will decrease Outcome: Adequate for Discharge   Problem: Elimination: Goal: Will not experience complications related to bowel motility Outcome: Adequate for Discharge Goal: Will not experience complications related to urinary retention Outcome: Adequate for Discharge   Problem: Pain Managment: Goal: General experience of comfort will improve Outcome: Adequate for Discharge   Problem: Safety: Goal: Ability to remain free from injury will improve Outcome: Adequate for Discharge   Problem: Skin Integrity: Goal: Risk for impaired skin integrity will decrease Outcome: Adequate for Discharge   Problem: Education: Goal: Verbalization of understanding the information provided (i.e., activity precautions, restrictions, etc) will improve Outcome: Adequate for  Discharge   Problem: Activity: Goal: Ability to ambulate and perform ADLs will improve Outcome: Adequate for Discharge   Problem: Clinical Measurements: Goal: Postoperative complications will be avoided or minimized Outcome: Adequate for Discharge   Problem: Self-Concept: Goal: Ability to maintain and perform role responsibilities to the fullest extent possible will improve Outcome: Adequate for Discharge   Problem: Pain Management: Goal: Pain level will decrease Outcome: Adequate for Discharge   Problem: Malnutrition  (NI-5.2) Goal: Food and/or nutrient delivery Description: Individualized approach for food/nutrient provision. Outcome: Adequate for Discharge   Problem: Acute Rehab OT Goals (only OT should resolve) Goal: Pt. Will Perform Grooming Outcome: Adequate for Discharge Goal: Pt. Will Perform Lower Body Dressing Outcome: Adequate for Discharge Goal: Pt. Will Transfer To Toilet Outcome: Adequate for Discharge Goal: Pt. Will Perform Toileting-Clothing Manipulation Outcome: Adequate for Discharge Goal: OT Additional ADL Goal #1 Outcome: Adequate for Discharge   Haydee Salter, RN

## 2022-02-15 NOTE — Progress Notes (Signed)
Physical Therapy Treatment Patient Details Name: Trevor Ward MRN: [id number] DOB: [birth year] Today's Date: 02/15/2022   History of Present Illness 81 y.o. male presenting to the emergency department with left hip pain after a fall.  xray=comminuted left femoral neck fracture; skeletal fx negative. pt is now s/p L DA THA on 02/09/22.  PMH:  CAD, CABG, PAD, HTN, and thoracic aortic aneurysm,    PT Comments    Pt agreeable to working with PT. He continues to report dizziness, nausea. He walked a few feet in the room but distance was limited. Assisted pt back to bed at his request. Plan is for SNF.    Recommendations for follow up therapy are one component of a multi-disciplinary discharge planning process, led by the attending physician.  Recommendations may be updated based on patient status, additional functional criteria and insurance authorization.  Follow Up Recommendations  Skilled nursing-short term rehab (<3 hours/day)     Assistance Recommended at Discharge Frequent or constant Supervision/Assistance  Patient can return home with the following A lot of help with walking and/or transfers;A little help with bathing/dressing/bathroom   Equipment Recommendations  Rolling walker (2 wheels)    Recommendations for Other Services       Precautions / Restrictions Precautions Precautions: Fall Restrictions Weight Bearing Restrictions: No LLE Weight Bearing: Weight bearing as tolerated     Mobility  Bed Mobility Overal bed mobility: Needs Assistance Bed Mobility: Supine to Sit, Sit to Supine     Supine to sit: Mod assist, HOB elevated Sit to supine: Mod assist, HOB elevated   General bed mobility comments: Assist for trunk and LEs. Increased time. Pt reports some dizziness/nausea    Transfers Overall transfer level: Needs assistance Equipment used: Rolling walker (2 wheels) Transfers: Sit to/from Stand Sit to Stand: Min assist, From elevated surface            General transfer comment: Assist to power up, steady, control descent. Cues required. Increased time. He continues to report not feeling well.    Ambulation/Gait Ambulation/Gait assistance: Min assist Gait Distance (Feet): 5 Feet Assistive device: Rolling walker (2 wheels) Gait Pattern/deviations: Step-to pattern       General Gait Details: Assist to stabilize. Followed closely with recliner. Distance limited by nausea, dizziness.   Stairs             Wheelchair Mobility    Modified Rankin (Stroke Patients Only)       Balance Overall balance assessment: Needs assistance         Standing balance support: Bilateral upper extremity supported, During functional activity, Reliant on assistive device for balance Standing balance-Leahy Scale: Poor                              Cognition Arousal/Alertness: Awake/alert Behavior During Therapy: WFL for tasks assessed/performed Overall Cognitive Status: Within Functional Limits for tasks assessed Area of Impairment: Problem solving                             Problem Solving: Requires verbal cues General Comments: Pt hard of hearing        Exercises      General Comments        Pertinent Vitals/Pain Pain Assessment Pain Assessment: Faces Faces Pain Scale: Hurts a little bit Pain Location: L LE Pain Descriptors / Indicators: Discomfort, Sore Pain Intervention(s): Monitored during session  Home Living                          Prior Function            PT Goals (current goals can now be found in the care plan section) Progress towards PT goals: Progressing toward goals    Frequency    Min 3X/week      PT Plan Current plan remains appropriate    Co-evaluation              AM-PAC PT "6 Clicks" Mobility   Outcome Measure  Help needed turning from your back to your side while in a flat bed without using bedrails?: A Little Help needed moving from  lying on your back to sitting on the side of a flat bed without using bedrails?: A Little Help needed moving to and from a bed to a chair (including a wheelchair)?: A Little Help needed standing up from a chair using your arms (e.g., wheelchair or bedside chair)?: A Little Help needed to walk in hospital room?: A Lot Help needed climbing 3-5 steps with a railing? : A Lot 6 Click Score: 16    End of Session Equipment Utilized During Treatment: Gait belt Activity Tolerance: Patient limited by fatigue (limited by nausea, dizziness)   Nurse Communication:  (pt reports hx of vertigo. he mentioned that he normally takes medicine for it. asked RN if he could get meclizine (which is on his med list already)) PT Visit Diagnosis: Other abnormalities of gait and mobility (R26.89);Difficulty in walking, not elsewhere classified (R26.2)     Time: 1115-1140 PT Time Calculation (min) (ACUTE ONLY): 25 min  Charges:  $Gait Training: 8-22 mins $Therapeutic Activity: 8-22 mins              Doreatha Massed, PT Acute Rehabilitation  Office: [phone] Pager: [phone]

## 2022-02-15 NOTE — Progress Notes (Signed)
Patient in yellow MEWS. Bolus given. BP stable.

## 2022-02-15 NOTE — Progress Notes (Signed)
Occupational Therapy Treatment Patient Details Name: Trevor Ward MRN: [id number] DOB: [birth year] Today's Date: 02/15/2022   History of present illness 81 y.o. male presenting to the emergency department with left hip pain after a fall.  xray=comminuted left femoral neck fracture; skeletal fx negative. pt is now s/p L DA THA on 02/09/22.  PMH:  CAD, CABG, PAD, HTN, and thoracic aortic aneurysm,   OT comments  Patient supervision to transfer to edge of bed and with increased time. Complained of feeling "swimmy headed" but BP WFL. Patient min guard to stand and ambulate to bathroom, perform toilet transfer and perform toileting. Stood at sink to wash his hands and returned to recliner. Patient's goal is to go dancing again. Patient making progress. Continue to recommend short term rehab.   Recommendations for follow up therapy are one component of a multi-disciplinary discharge planning process, led by the attending physician.  Recommendations may be updated based on patient status, additional functional criteria and insurance authorization.    Follow Up Recommendations  Skilled nursing-short term rehab (<3 hours/day)    Assistance Recommended at Discharge Frequent or constant Supervision/Assistance  Patient can return home with the following  Direct supervision/assist for financial management;Direct supervision/assist for medications management;Assistance with cooking/housework;Assist for transportation;A little help with bathing/dressing/bathroom;A little help with walking and/or transfers   Equipment Recommendations  Other (comment) (fer to SNF)    Recommendations for Other Services      Precautions / Restrictions Precautions Precautions: Fall Restrictions Weight Bearing Restrictions: No LLE Weight Bearing: Weight bearing as tolerated       Mobility Bed Mobility                    Transfers                         Balance   Sitting-balance support: No  upper extremity supported Sitting balance-Leahy Scale: Good     Standing balance support: During functional activity Standing balance-Leahy Scale: Fair Standing balance comment: can take hands off of walker for clothing management                           ADL either performed or assessed with clinical judgement   ADL Overall ADL's : Needs assistance/impaired                         Toilet Transfer: Min guard;Rolling walker (2 wheels);Grab bars Toilet Transfer Details (indicate cue type and reason): Able to ambulate with RW to bathroom and perform tiolet transfer with verbal cues for hand placement Toileting- Clothing Manipulation and Hygiene: Supervision/safety;Sit to/from stand Toileting - Clothing Manipulation Details (indicate cue type and reason): able to perform all aspects of toileting       General ADL Comments: Patient supervision with increased time to transfer to edge of bed. Monitored BP - and systolic BP maintained in 120s. Min guard to stand and ambulate to bathroom and perform toileting    Extremity/Trunk Assessment              Vision Patient Visual Report: No change from baseline     Perception     Praxis      Cognition Arousal/Alertness: Awake/alert Behavior During Therapy: WFL for tasks assessed/performed Overall Cognitive Status: Within Functional Limits for tasks assessed Area of Impairment: Problem solving  Problem Solving: Requires verbal cues General Comments: Pt hard of hearing        Exercises      Shoulder Instructions       General Comments      Pertinent Vitals/ Pain       Pain Assessment Pain Assessment: No/denies pain Faces Pain Scale: Hurts a little bit Pain Location: L LE Pain Descriptors / Indicators: Sore Pain Intervention(s): Monitored during session  Home Living                                          Prior Functioning/Environment               Frequency  Min 2X/week        Progress Toward Goals  OT Goals(current goals can now be found in the care plan section)  Progress towards OT goals: Progressing toward goals  Acute Rehab OT Goals Patient Stated Goal: wants to go dancing again OT Goal Formulation: With patient/family Time For Goal Achievement: 02/24/22 Potential to Achieve Goals: Good  Plan Discharge plan remains appropriate    Co-evaluation                 AM-PAC OT "6 Clicks" Daily Activity     Outcome Measure   Help from another person eating meals?: None Help from another person taking care of personal grooming?: A Little Help from another person toileting, which includes using toliet, bedpan, or urinal?: A Little Help from another person bathing (including washing, rinsing, drying)?: A Little Help from another person to put on and taking off regular upper body clothing?: None Help from another person to put on and taking off regular lower body clothing?: A Little 6 Click Score: 20    End of Session Equipment Utilized During Treatment: Gait belt;Rolling walker (2 wheels)  OT Visit Diagnosis: Unsteadiness on feet (R26.81);Other symptoms and signs involving cognitive function;History of falling (Z91.81)   Activity Tolerance Patient tolerated treatment well   Patient Left in chair;with chair alarm set   Nurse Communication Mobility status        Time: [phone] OT Time Calculation (min): 23 min  Charges: OT General Charges $OT Visit: 1 Visit OT Treatments $Self Care/Home Management : 23-37 mins  Armanii Urbanik, OTR/L Acute Care Rehab Services  Office [phone] Pager: [place] 02/15/2022, 1:04 PM

## 2022-02-15 NOTE — Plan of Care (Signed)

## 2022-02-15 NOTE — Progress Notes (Signed)
Seen and examined at bedside.  Therapy staff and patient's wife at bedside as well.  No complaints.  Vital signs are stable. Discharge summary completed on 5/22, updated today as well.  Gerlean Ren MD [facility]

## 2022-02-16 DIAGNOSIS — Z79899 Other long term (current) drug therapy: Secondary | ICD-10-CM | POA: Diagnosis not present

## 2022-02-21 DIAGNOSIS — S72002D Fracture of unspecified part of neck of left femur, subsequent encounter for closed fracture with routine healing: Secondary | ICD-10-CM | POA: Diagnosis not present

## 2022-02-21 DIAGNOSIS — K5900 Constipation, unspecified: Secondary | ICD-10-CM | POA: Diagnosis not present

## 2022-02-21 DIAGNOSIS — F32A Depression, unspecified: Secondary | ICD-10-CM | POA: Diagnosis not present

## 2022-02-21 DIAGNOSIS — Z96642 Presence of left artificial hip joint: Secondary | ICD-10-CM | POA: Diagnosis not present

## 2022-02-21 DIAGNOSIS — W19XXXD Unspecified fall, subsequent encounter: Secondary | ICD-10-CM | POA: Diagnosis not present

## 2022-02-21 DIAGNOSIS — I10 Essential (primary) hypertension: Secondary | ICD-10-CM | POA: Diagnosis not present

## 2022-02-21 DIAGNOSIS — K219 Gastro-esophageal reflux disease without esophagitis: Secondary | ICD-10-CM | POA: Diagnosis not present

## 2022-02-21 DIAGNOSIS — I739 Peripheral vascular disease, unspecified: Secondary | ICD-10-CM | POA: Diagnosis not present

## 2022-02-21 DIAGNOSIS — M109 Gout, unspecified: Secondary | ICD-10-CM | POA: Diagnosis not present

## 2022-02-22 DIAGNOSIS — Z79899 Other long term (current) drug therapy: Secondary | ICD-10-CM | POA: Diagnosis not present

## 2022-02-23 DIAGNOSIS — R079 Chest pain, unspecified: Secondary | ICD-10-CM | POA: Diagnosis not present

## 2022-02-23 DIAGNOSIS — M9702XD Periprosthetic fracture around internal prosthetic left hip joint, subsequent encounter: Secondary | ICD-10-CM | POA: Diagnosis not present

## 2022-02-23 DIAGNOSIS — S72002D Fracture of unspecified part of neck of left femur, subsequent encounter for closed fracture with routine healing: Secondary | ICD-10-CM | POA: Diagnosis not present

## 2022-02-24 DIAGNOSIS — M9702XD Periprosthetic fracture around internal prosthetic left hip joint, subsequent encounter: Secondary | ICD-10-CM | POA: Diagnosis not present

## 2022-03-01 DIAGNOSIS — I25810 Atherosclerosis of coronary artery bypass graft(s) without angina pectoris: Secondary | ICD-10-CM | POA: Diagnosis not present

## 2022-03-01 DIAGNOSIS — R413 Other amnesia: Secondary | ICD-10-CM | POA: Diagnosis not present

## 2022-03-01 DIAGNOSIS — I719 Aortic aneurysm of unspecified site, without rupture: Secondary | ICD-10-CM | POA: Diagnosis not present

## 2022-03-01 DIAGNOSIS — S72002A Fracture of unspecified part of neck of left femur, initial encounter for closed fracture: Secondary | ICD-10-CM | POA: Diagnosis not present

## 2022-03-04 DIAGNOSIS — I10 Essential (primary) hypertension: Secondary | ICD-10-CM | POA: Diagnosis not present

## 2022-03-04 DIAGNOSIS — K219 Gastro-esophageal reflux disease without esophagitis: Secondary | ICD-10-CM | POA: Diagnosis not present

## 2022-03-04 DIAGNOSIS — K5900 Constipation, unspecified: Secondary | ICD-10-CM | POA: Diagnosis not present

## 2022-03-04 DIAGNOSIS — I712 Thoracic aortic aneurysm, without rupture, unspecified: Secondary | ICD-10-CM | POA: Diagnosis not present

## 2022-03-04 DIAGNOSIS — F32A Depression, unspecified: Secondary | ICD-10-CM | POA: Diagnosis not present

## 2022-03-04 DIAGNOSIS — I2510 Atherosclerotic heart disease of native coronary artery without angina pectoris: Secondary | ICD-10-CM | POA: Diagnosis not present

## 2022-03-04 DIAGNOSIS — S72022D Displaced fracture of epiphysis (separation) (upper) of left femur, subsequent encounter for closed fracture with routine healing: Secondary | ICD-10-CM | POA: Diagnosis not present

## 2022-03-04 DIAGNOSIS — I739 Peripheral vascular disease, unspecified: Secondary | ICD-10-CM | POA: Diagnosis not present

## 2022-03-04 DIAGNOSIS — M109 Gout, unspecified: Secondary | ICD-10-CM | POA: Diagnosis not present

## 2022-03-16 DIAGNOSIS — R4182 Altered mental status, unspecified: Secondary | ICD-10-CM | POA: Diagnosis not present

## 2022-03-16 DIAGNOSIS — R410 Disorientation, unspecified: Secondary | ICD-10-CM | POA: Diagnosis not present

## 2022-03-17 ENCOUNTER — Encounter (HOSPITAL_COMMUNITY): Payer: Self-pay | Admitting: Emergency Medicine

## 2022-03-17 ENCOUNTER — Emergency Department (HOSPITAL_COMMUNITY): Payer: Medicare HMO

## 2022-03-17 ENCOUNTER — Other Ambulatory Visit: Payer: Self-pay

## 2022-03-17 ENCOUNTER — Emergency Department (HOSPITAL_COMMUNITY)
Admission: EM | Admit: 2022-03-17 | Discharge: 2022-03-18 | Disposition: A | Discharge status: Home / Self Care | Payer: Medicare HMO | Attending: Emergency Medicine | Admitting: Emergency Medicine

## 2022-03-17 DIAGNOSIS — I6782 Cerebral ischemia: Secondary | ICD-10-CM | POA: Diagnosis not present

## 2022-03-17 DIAGNOSIS — R410 Disorientation, unspecified: Secondary | ICD-10-CM | POA: Diagnosis not present

## 2022-03-17 DIAGNOSIS — R2689 Other abnormalities of gait and mobility: Secondary | ICD-10-CM | POA: Diagnosis not present

## 2022-03-17 DIAGNOSIS — F411 Generalized anxiety disorder: Secondary | ICD-10-CM | POA: Diagnosis not present

## 2022-03-17 DIAGNOSIS — R4182 Altered mental status, unspecified: Secondary | ICD-10-CM | POA: Diagnosis not present

## 2022-03-17 DIAGNOSIS — R29818 Other symptoms and signs involving the nervous system: Secondary | ICD-10-CM | POA: Diagnosis not present

## 2022-03-17 DIAGNOSIS — R479 Unspecified speech disturbances: Secondary | ICD-10-CM | POA: Diagnosis not present

## 2022-03-17 DIAGNOSIS — R638 Other symptoms and signs concerning food and fluid intake: Secondary | ICD-10-CM | POA: Diagnosis not present

## 2022-03-17 DIAGNOSIS — Z7982 Long term (current) use of aspirin: Secondary | ICD-10-CM | POA: Diagnosis not present

## 2022-03-17 DIAGNOSIS — Z79899 Other long term (current) drug therapy: Secondary | ICD-10-CM | POA: Diagnosis not present

## 2022-03-17 DIAGNOSIS — R531 Weakness: Secondary | ICD-10-CM | POA: Insufficient documentation

## 2022-03-17 LAB — COMPREHENSIVE METABOLIC PANEL
ALT: 14 U/L (ref 0–44)
AST: 17 U/L (ref 15–41)
Albumin: 3.7 g/dL (ref 3.5–5.0)
Alkaline Phosphatase: 101 U/L (ref 38–126)
Anion gap: 9 (ref 5–15)
BUN: 20 mg/dL (ref 8–23)
CO2: 26 mmol/L (ref 22–32)
Calcium: 9.1 mg/dL (ref 8.9–10.3)
Chloride: 102 mmol/L (ref 98–111)
Creatinine, Ser: 1.12 mg/dL (ref 0.61–1.24)
GFR, Estimated: >60 mL/min (ref >60)
Glucose, Bld: 114 mg/dL — ABNORMAL HIGH (ref 70–99)
Potassium: 4.1 mmol/L (ref 3.5–5.1)
Sodium: 137 mmol/L (ref 135–145)
Total Bilirubin: 0.8 mg/dL (ref 0.3–1.2)
Total Protein: 7.5 g/dL (ref 6.5–8.1)

## 2022-03-17 LAB — CBC
HCT: 43.2 % (ref 39.0–52.0)
Hemoglobin: 14.4 g/dL (ref 13.0–17.0)
MCH: 29.9 pg (ref 26.0–34.0)
MCHC: 33.3 g/dL (ref 30.0–36.0)
MCV: 89.8 fL (ref 80.0–100.0)
Platelets: 263 10*3/uL (ref 150–400)
RBC: 4.81 MIL/uL (ref 4.22–5.81)
RDW: 13.8 % (ref 11.5–15.5)
WBC: 8.5 10*3/uL (ref 4.0–10.5)
nRBC: 0 % (ref 0.0–0.2)

## 2022-03-17 LAB — RAPID URINE DRUG SCREEN, HOSP PERFORMED
Amphetamines: NOT DETECTED
Barbiturates: NOT DETECTED
Benzodiazepines: NOT DETECTED
Cocaine: NOT DETECTED
Opiates: POSITIVE — AB
Tetrahydrocannabinol: NOT DETECTED

## 2022-03-17 LAB — TROPONIN I (HIGH SENSITIVITY): Troponin I (High Sensitivity): 5 ng/L (ref <18)

## 2022-03-17 LAB — URINALYSIS, ROUTINE W REFLEX MICROSCOPIC
Bilirubin Urine: NEGATIVE
Glucose, UA: NEGATIVE mg/dL
Hgb urine dipstick: NEGATIVE
Ketones, ur: NEGATIVE mg/dL
Leukocytes,Ua: NEGATIVE
Nitrite: NEGATIVE
Protein, ur: NEGATIVE mg/dL
Specific Gravity, Urine: 1.009 (ref 1.005–1.030)
pH: 6 (ref 5.0–8.0)

## 2022-03-17 LAB — ETHANOL: Alcohol, Ethyl (B): <10 mg/dL (ref <10)

## 2022-03-17 MED ORDER — SODIUM CHLORIDE 0.9 % IV BOLUS
1000.0000 mL | Freq: Once | INTRAVENOUS | Status: AC
  Administered 2022-03-17: 1000 mL via INTRAVENOUS

## 2022-03-17 MED ORDER — LORAZEPAM 2 MG/ML IJ SOLN
1.0000 mg | Freq: Once | INTRAMUSCULAR | Status: AC
  Administered 2022-03-17: 1 mg via INTRAVENOUS
  Filled 2022-03-17: qty 1

## 2022-03-17 NOTE — ED Triage Notes (Signed)
Pt arrives w/ family. Family reports since Monday pt has had increased weakness and aphasia. Was sent by PCP due to concern for stroke

## 2022-03-24 DIAGNOSIS — S72032D Displaced midcervical fracture of left femur, subsequent encounter for closed fracture with routine healing: Secondary | ICD-10-CM | POA: Diagnosis not present

## 2022-04-05 ENCOUNTER — Ambulatory Visit: Payer: Medicare HMO | Admitting: Diagnostic Neuroimaging

## 2022-04-05 ENCOUNTER — Encounter: Payer: Self-pay | Admitting: Diagnostic Neuroimaging

## 2022-04-05 VITALS — BP 101/55 | HR 77 | Ht 68.0 in | Wt 173.0 lb

## 2022-04-05 DIAGNOSIS — R413 Other amnesia: Secondary | ICD-10-CM

## 2022-04-05 DIAGNOSIS — R269 Unspecified abnormalities of gait and mobility: Secondary | ICD-10-CM

## 2022-04-05 DIAGNOSIS — F03A0 Unspecified dementia, mild, without behavioral disturbance, psychotic disturbance, mood disturbance, and anxiety: Secondary | ICD-10-CM | POA: Diagnosis not present

## 2022-04-05 NOTE — Patient Instructions (Signed)
  GAIT DIFFICULTY / MEMORY LOSS (decline in ADLs; navigation; balance; judgement; hearing) - check B12 level - safety / supervision issues reviewed - daily physical activity / exercise (at least 15-30 minutes) - eat more plants / vegetables - increase social activities, brain stimulation, games, puzzles, hobbies, crafts, arts, music - aim for at least 7-8 hours sleep per night (or more) - avoid smoking and alcohol - caregiver resources provided - caution with medications, finances; no driving

## 2022-04-05 NOTE — Progress Notes (Signed)
[facility]  PATIENT: Trevor Ward DOB: [birth year]  REFERRING CLINICIAN: Mayra Neer, MD HISTORY FROM: patient  REASON FOR VISIT: new consult   HISTORICAL  CHIEF COMPLAINT:  Chief Complaint  Patient presents with   Generalized weakness    Rm 7  New Pt, ED referral , wife- Ivin Booty  "has been falling often for a couple of years; feels tired/weak all over; gets SOB very easily; leg would fall asleep and he couldn't get out of car" MMSE 15    HISTORY OF PRESENT ILLNESS:   81 year old male here for evaluation of gait balance difficulty and memory problems.  Patient is a retired [employer] manager, and stopped working around 2015 at age 49 years old.  Around this time patient had heart attack and coronary bypass surgery.  Around that time he started to slow down physically and cognitively.  This was mainly noticed in the past 2-3 years.  Wife notes that few years ago he suddenly decided to drive to the beach while she was at work.  Apparently he got lost while trying to get there, ended up sleeping on the side of the road in his car.  He was having a hard time telling his family exactly where he was.  Since that has had some other episodes with cognitive difficulty.  Gait and balance have progressively declined as well.  He started having more balance and stumbling gait issues.  He has had several falls.  Recently had a fall which led to left hip fracture and surgery in May 2023.  In June 2023 he was feeling generally weak and confused, and went to the emergency room for evaluation.  MRI of the brain was obtained which showed no acute findings.  Overall patient's ADLs have slightly declined.  Patient lives at home with wife.  Has some children that live nearby.  Has some hearing loss as well.   REVIEW OF SYSTEMS: Full 14 system review of systems performed and negative with exception of: as per HPI.  ALLERGIES: Allergies  Allergen Reactions   Statins Other (See Comments)     Severe myalgias   Mirtazapine     Other reaction(s): felt bad    HOME MEDICATIONS: Outpatient Medications Prior to Visit  Medication Sig Dispense Refill   allopurinol (ZYLOPRIM) 100 MG tablet Take 100 mg by mouth daily.      amitriptyline (ELAVIL) 25 MG tablet Take 25 mg by mouth at bedtime.     buPROPion (WELLBUTRIN XL) 300 MG 24 hr tablet      colchicine 0.6 MG tablet      docusate sodium (COLACE) 100 MG capsule Take 1 capsule (100 mg total) by mouth 2 (two) times daily. 10 capsule 0   Glucosamine Sulfate 1000 MG TABS Take 2,000 mg by mouth daily.     meclizine (ANTIVERT) 25 MG tablet Take 25 mg by mouth 4 (four) times daily as needed for dizziness.  0   metoprolol tartrate (LOPRESSOR) 25 MG tablet TAKE 1 TABLET TWO TIMES DAILY. MAKE OFFICE VISIT FOR MORE REFILLS. (Patient taking differently: Take 25 mg by mouth 2 (two) times daily.) 180 tablet 3   pantoprazole (PROTONIX) 40 MG tablet Take 1 tablet (40 mg total) by mouth daily. 90 tablet 3   polyethylene glycol (MIRALAX / GLYCOLAX) 17 g packet Take 17 g by mouth daily as needed for moderate constipation or severe constipation. 14 each 0   rosuvastatin (CRESTOR) 10 MG tablet Take 10 mg by mouth daily.  senna (SENOKOT) 8.6 MG TABS tablet Take 1 tablet (8.6 mg total) by mouth at bedtime as needed for mild constipation. 120 tablet 0   vitamin B-12 (CYANOCOBALAMIN) 500 MCG tablet Take 500 mcg by mouth daily.     No facility-administered medications prior to visit.    PAST MEDICAL HISTORY: Past Medical History:  Diagnosis Date   Arthritis    "fingers" (02/01/2016)   Depression    Diabetes mellitus without complication (HCC)    GERD (gastroesophageal reflux disease)    Hard of hearing    High cholesterol    "can't take RX; made my legs weak" (02/01/2016)   History of gout    History of hiatal hernia    Hypertension    Peripheral vascular disease ([place])    carotid artery stenosis   Skin cancer of face     "the good kind"    Stroke ([place]) 2016   denies residual on 02/01/2016    PAST SURGICAL HISTORY: Past Surgical History:  Procedure Laterality Date   CARDIAC CATHETERIZATION N/A 02/01/2016   Procedure: Left Heart Cath and Coronary Angiography;  Surgeon: Burnell Blanks, MD;  Location: [facility];  Service: Cardiovascular;  Laterality: N/A;   COLONOSCOPY     CORONARY ARTERY BYPASS GRAFT N/A 02/02/2016   Procedure: CORONARY ARTERY BYPASS GRAFTING (CABG) times 4 using left internal mammary and left saphenous ven harvested by endoven ;  Surgeon: Grace Isaac, MD;  Location: [place];  Service: Open Heart Surgery;  Laterality: N/A;   ENDARTERECTOMY Right 04/21/2015   Procedure: RIGHT CAROTID ENDARTERECTOMY;  Surgeon: Elam Dutch, MD;  Location: [place];  Service: Vascular;  Laterality: Right;   ESOPHAGOGASTRODUODENOSCOPY (EGD) WITH PROPOFOL N/A 01/03/2016   Procedure: ESOPHAGOGASTRODUODENOSCOPY (EGD) WITH PROPOFOL;  Surgeon: Garlan Fair, MD;  Location: [facility];  Service: Endoscopy;  Laterality: N/A;   KNEE ARTHROSCOPY Right    open heart surgery  01/2016   PERIPHERAL VASCULAR CATHETERIZATION Bilateral 04/14/2015   Procedure: Carotid Angiography;  Surgeon: Serafina Mitchell, MD;  Location: [place] [facility];  Service: Cardiovascular;  Laterality: Bilateral;   SKIN CANCER EXCISION Right    "temple"   TEE WITHOUT CARDIOVERSION N/A 02/02/2016   Procedure: TRANSESOPHAGEAL ECHOCARDIOGRAM (TEE);  Surgeon: Grace Isaac, MD;  Location: [place];  Service: Open Heart Surgery;  Laterality: N/A;   TOTAL HIP ARTHROPLASTY Left 02/09/2022   Procedure: TOTAL HIP ARTHROPLASTY ANTERIOR APPROACH;  Surgeon: Rod Can, MD;  Location: [facility];  Service: Orthopedics;  Laterality: Left;    FAMILY HISTORY: Family History  Problem Relation Age of Onset   CAD Mother    CVA Mother    Diabetes Mother    Breast cancer Mother    Parkinson's disease Mother    Arthritis Mother    Heart attack Father    CVA  Sister    CAD Brother    Diabetes Brother    CVA Brother    CAD Brother    Diabetes Brother     SOCIAL HISTORY: Social History   Socioeconomic History   Marital status: Married    Spouse name: Ivin Booty   Number of children: 3   Years of education: Not on file   Highest education level: 7th grade  Occupational History   Not on file  Tobacco Use   Smoking status: Former    Years: 1.50    Types: Cigarettes    Start date: 01/28/1956   Smokeless tobacco: Never   Tobacco comments:    "  quit smoking cigarettes when I was 81 yr old"  Vaping Use   Vaping Use: Never used  Substance and Sexual Activity   Alcohol use: No    Alcohol/week: 0.0 standard drinks of alcohol   Drug use: No   Sexual activity: Yes  Other Topics Concern   Not on file  Social History Narrative   Lives with wife   Epworth Sleepiness Scale = 16 (as of 01/28/16)   Social Determinants of Health   Financial Resource Strain: Not on file  Food Insecurity: Not on file  Transportation Needs: Not on file  Physical Activity: Not on file  Stress: Not on file  Social Connections: Not on file  Intimate Partner Violence: Not on file     PHYSICAL EXAM  GENERAL EXAM/CONSTITUTIONAL: Vitals:  Vitals:   04/05/22 0838  BP: (!) 101/55  Pulse: 77  Weight: 173 lb (78.5 kg)  Height: '5\' 8"'$  (1.727 m)   Body mass index is 26.3 kg/m. Wt Readings from Last 3 Encounters:  04/05/22 173 lb (78.5 kg)  02/09/22 200 lb (90.7 kg)  04/27/21 194 lb (88 kg)   Patient is in no distress; well developed, nourished and groomed; neck is supple  CARDIOVASCULAR: Examination of carotid arteries is normal; no carotid bruits Regular rate and rhythm, no murmurs Examination of peripheral vascular system by observation and palpation is normal  EYES: Ophthalmoscopic exam of optic discs and posterior segments is normal; no papilledema or hemorrhages No results found.  MUSCULOSKELETAL: Gait, strength, tone, movements noted in Neurologic  exam below  NEUROLOGIC: MENTAL STATUS:     04/05/2022    9:48 AM  MMSE - Mini Mental State Exam  Orientation to time 2  Orientation to Place 3  Registration 3  Attention/ Calculation 0  Recall 1  Language- name 2 objects 2  Language- repeat 0  Language- follow 3 step command 3  Language- read & follow direction 1  Language-read & follow direction-comments illiterate, had to read to him  Write a sentence 0  Copy design 0  Total score 15   awake, alert, oriented to person Pelzer attention and concentration language fluent, comprehension intact, naming intact fund of knowledge appropriate  CRANIAL NERVE:  2nd - no papilledema on fundoscopic exam 2nd, 3rd, 4th, 6th - pupils equal and reactive to light, visual fields full to confrontation, extraocular muscles intact, no nystagmus 5th - facial sensation symmetric 7th - facial strength symmetric 8th - hearing DECREASED 9th - palate elevates symmetrically, uvula midline 11th - shoulder shrug symmetric 12th - tongue protrusion midline  MOTOR:  normal bulk and tone, full strength in the BUE, BLE  SENSORY:  normal and symmetric to light touch, temperature, vibration  COORDINATION:  finger-nose-finger, fine finger movements normal  REFLEXES:  deep tendon reflexes TRACE and symmetric  GAIT/STATION:  narrow based gait; SLOW TO RISE; USING WALKER     DIAGNOSTIC DATA (LABS, IMAGING, TESTING) - I reviewed patient records, labs, notes, testing and imaging myself where available.  Lab Results  Component Value Date   WBC 8.5 03/17/2022   HGB 14.4 03/17/2022   HCT 43.2 03/17/2022   MCV 89.8 03/17/2022   PLT 263 03/17/2022      Component Value Date/Time   NA 137 03/17/2022 1723   NA 141 07/12/2021 1549   K 4.1 03/17/2022 1723   CL 102 03/17/2022 1723   CO2 26 03/17/2022 1723   GLUCOSE 114 (H) 03/17/2022 1723   BUN 20 03/17/2022 1723  BUN 12 07/12/2021 1549   CREATININE 1.12 03/17/2022 1723    CREATININE 0.85 05/16/2016 0945   CALCIUM 9.1 03/17/2022 1723   PROT 7.5 03/17/2022 1723   ALBUMIN 3.7 03/17/2022 1723   AST 17 03/17/2022 1723   ALT 14 03/17/2022 1723   ALKPHOS 101 03/17/2022 1723   BILITOT 0.8 03/17/2022 1723   GFRNONAA >60 03/17/2022 1723   GFRAA >60 02/08/2016 0405   Lab Results  Component Value Date   CHOL 115 03/16/2017   HDL 23 (L) 03/16/2017   LDLCALC 58 03/16/2017   TRIG 168 (H) 03/16/2017   CHOLHDL 5.0 03/16/2017   Lab Results  Component Value Date   HGBA1C 6.4 (H) 02/02/2016   No results found for: "VITAMINB12" Lab Results  Component Value Date   TSH 2.55 01/31/2016   02/08/22 CT head / cervical spine [I reviewed images myself and agree with interpretation. -VRP]  1. No acute intracranial abnormality progressive atrophy and chronic microvascular ischemic change since 2016 2. Negative for cervical spine fracture. Moderate cervical spondylosis.  03/17/22 MRI brain [I reviewed images myself and agree with interpretation. -VRP]  1. No acute intracranial abnormality. 2. Mild chronic microvascular ischemic disease for age.    ASSESSMENT AND PLAN  81 y.o. year old male here with gradual onset progressive gait and balance difficulty, cognitive decline, decline in ADLs, with signs and symptoms most consistent with neurodegenerative dementia.  Also may have some age-related deconditioning and progressive decline.     Dx:  1. Gait difficulty   2. Memory loss   3. Mild dementia without behavioral disturbance, psychotic disturbance, mood disturbance, or anxiety, unspecified dementia type ([place])       PLAN:  GAIT DIFFICULTY / MEMORY LOSS (decline in ADLs; navigation; balance; judgement; hearing; limited baseline literacy; MMSE 15/30; concern for neurodegenerative dementia) - check B12 level - use walker - safety / supervision issues reviewed - daily physical activity / exercise (at least 15-30 minutes); continue home PT when able - increase  social activities, brain stimulation, games, puzzles, hobbies, crafts, arts, music - aim for at least 7-8 hours sleep per night (or more) - avoid smoking and alcohol - caregiver resources provided - caution with medications, finances; no driving  Orders Placed This Encounter  Procedures   Vitamin B12   Return for pending if symptoms worsen or fail to improve, return to PCP.    Penni Bombard, MD 0/03/6225, 33:35 AM Certified in Neurology, Neurophysiology and Neuroimaging  [facility] [address] [place], [place] [address] [phone]

## 2022-04-06 LAB — VITAMIN B12: Vitamin B-12: 946 pg/mL (ref 232–1245)

## 2022-04-10 ENCOUNTER — Telehealth: Payer: Self-pay | Admitting: *Deleted

## 2022-04-10 ENCOUNTER — Telehealth: Payer: Self-pay

## 2022-04-10 NOTE — Telephone Encounter (Signed)
-----   Message from Penni Bombard, MD sent at 04/10/2022  2:32 PM EDT ----- Normal labs. Please call patient. -VRP

## 2022-04-10 NOTE — Telephone Encounter (Signed)
Called wife on DPR and informed the vit B12 lab was normal. Answered her questions to her stated satisfaction. She verbalized understanding, appreciation.

## 2022-04-10 NOTE — Telephone Encounter (Signed)
Error

## 2022-04-17 DIAGNOSIS — F411 Generalized anxiety disorder: Secondary | ICD-10-CM | POA: Diagnosis not present

## 2022-04-17 DIAGNOSIS — E1169 Type 2 diabetes mellitus with other specified complication: Secondary | ICD-10-CM | POA: Diagnosis not present

## 2022-04-17 DIAGNOSIS — R54 Age-related physical debility: Secondary | ICD-10-CM | POA: Diagnosis not present

## 2022-04-17 DIAGNOSIS — J449 Chronic obstructive pulmonary disease, unspecified: Secondary | ICD-10-CM | POA: Diagnosis not present

## 2022-04-17 DIAGNOSIS — E782 Mixed hyperlipidemia: Secondary | ICD-10-CM | POA: Diagnosis not present

## 2022-04-17 DIAGNOSIS — G4700 Insomnia, unspecified: Secondary | ICD-10-CM | POA: Diagnosis not present

## 2022-04-17 DIAGNOSIS — I119 Hypertensive heart disease without heart failure: Secondary | ICD-10-CM | POA: Diagnosis not present

## 2022-04-17 DIAGNOSIS — F0390 Unspecified dementia without behavioral disturbance: Secondary | ICD-10-CM | POA: Diagnosis not present

## 2022-04-17 DIAGNOSIS — Z8673 Personal history of transient ischemic attack (TIA), and cerebral infarction without residual deficits: Secondary | ICD-10-CM | POA: Diagnosis not present

## 2022-05-15 DIAGNOSIS — Z96642 Presence of left artificial hip joint: Secondary | ICD-10-CM | POA: Diagnosis not present

## 2022-05-15 DIAGNOSIS — M25552 Pain in left hip: Secondary | ICD-10-CM | POA: Diagnosis not present

## 2022-07-24 DIAGNOSIS — I119 Hypertensive heart disease without heart failure: Secondary | ICD-10-CM | POA: Diagnosis not present

## 2022-07-24 DIAGNOSIS — R54 Age-related physical debility: Secondary | ICD-10-CM | POA: Diagnosis not present

## 2022-07-24 DIAGNOSIS — F0390 Unspecified dementia without behavioral disturbance: Secondary | ICD-10-CM | POA: Diagnosis not present

## 2022-07-24 DIAGNOSIS — Z23 Encounter for immunization: Secondary | ICD-10-CM | POA: Diagnosis not present

## 2022-07-24 DIAGNOSIS — F411 Generalized anxiety disorder: Secondary | ICD-10-CM | POA: Diagnosis not present

## 2022-07-24 DIAGNOSIS — E782 Mixed hyperlipidemia: Secondary | ICD-10-CM | POA: Diagnosis not present

## 2022-07-24 DIAGNOSIS — F0394 Unspecified dementia, unspecified severity, with anxiety: Secondary | ICD-10-CM | POA: Diagnosis not present

## 2022-07-24 DIAGNOSIS — E1169 Type 2 diabetes mellitus with other specified complication: Secondary | ICD-10-CM | POA: Diagnosis not present

## 2022-09-26 DIAGNOSIS — M79605 Pain in left leg: Secondary | ICD-10-CM | POA: Diagnosis not present

## 2022-09-26 DIAGNOSIS — F0390 Unspecified dementia without behavioral disturbance: Secondary | ICD-10-CM | POA: Diagnosis not present

## 2022-09-26 DIAGNOSIS — I119 Hypertensive heart disease without heart failure: Secondary | ICD-10-CM | POA: Diagnosis not present

## 2022-09-26 DIAGNOSIS — R54 Age-related physical debility: Secondary | ICD-10-CM | POA: Diagnosis not present

## 2022-10-23 DIAGNOSIS — F411 Generalized anxiety disorder: Secondary | ICD-10-CM | POA: Diagnosis not present

## 2022-10-23 DIAGNOSIS — E1169 Type 2 diabetes mellitus with other specified complication: Secondary | ICD-10-CM | POA: Diagnosis not present

## 2022-10-23 DIAGNOSIS — Z0000 Encounter for general adult medical examination without abnormal findings: Secondary | ICD-10-CM | POA: Diagnosis not present

## 2022-10-23 DIAGNOSIS — I700 Atherosclerosis of aorta: Secondary | ICD-10-CM | POA: Diagnosis not present

## 2022-10-23 DIAGNOSIS — J449 Chronic obstructive pulmonary disease, unspecified: Secondary | ICD-10-CM | POA: Diagnosis not present

## 2022-10-23 DIAGNOSIS — I779 Disorder of arteries and arterioles, unspecified: Secondary | ICD-10-CM | POA: Diagnosis not present

## 2022-10-23 DIAGNOSIS — I119 Hypertensive heart disease without heart failure: Secondary | ICD-10-CM | POA: Diagnosis not present

## 2022-10-23 DIAGNOSIS — I712 Thoracic aortic aneurysm, without rupture, unspecified: Secondary | ICD-10-CM | POA: Diagnosis not present

## 2022-10-23 DIAGNOSIS — E782 Mixed hyperlipidemia: Secondary | ICD-10-CM | POA: Diagnosis not present

## 2023-04-04 ENCOUNTER — Emergency Department (HOSPITAL_COMMUNITY): Payer: Medicare HMO

## 2023-04-04 ENCOUNTER — Inpatient Hospital Stay (HOSPITAL_COMMUNITY)
Admission: EM | Admit: 2023-04-04 | Discharge: 2023-04-10 | DRG: 522 | Disposition: A | Discharge status: Skilled Nursing Facility | Payer: Medicare HMO | Attending: Internal Medicine | Admitting: Internal Medicine

## 2023-04-04 ENCOUNTER — Inpatient Hospital Stay (HOSPITAL_COMMUNITY): Payer: Medicare HMO | Admitting: Anesthesiology

## 2023-04-04 ENCOUNTER — Encounter (HOSPITAL_COMMUNITY): Payer: Self-pay

## 2023-04-04 ENCOUNTER — Other Ambulatory Visit: Payer: Self-pay

## 2023-04-04 DIAGNOSIS — Y92009 Unspecified place in unspecified non-institutional (private) residence as the place of occurrence of the external cause: Secondary | ICD-10-CM | POA: Diagnosis not present

## 2023-04-04 DIAGNOSIS — Z85828 Personal history of other malignant neoplasm of skin: Secondary | ICD-10-CM

## 2023-04-04 DIAGNOSIS — F32A Depression, unspecified: Secondary | ICD-10-CM | POA: Diagnosis not present

## 2023-04-04 DIAGNOSIS — F03A0 Unspecified dementia, mild, without behavioral disturbance, psychotic disturbance, mood disturbance, and anxiety: Secondary | ICD-10-CM | POA: Diagnosis present

## 2023-04-04 DIAGNOSIS — R296 Repeated falls: Secondary | ICD-10-CM | POA: Diagnosis present

## 2023-04-04 DIAGNOSIS — Z820 Family history of epilepsy and other diseases of the nervous system: Secondary | ICD-10-CM | POA: Diagnosis not present

## 2023-04-04 DIAGNOSIS — M109 Gout, unspecified: Secondary | ICD-10-CM | POA: Diagnosis not present

## 2023-04-04 DIAGNOSIS — Z87891 Personal history of nicotine dependence: Secondary | ICD-10-CM | POA: Diagnosis not present

## 2023-04-04 DIAGNOSIS — Z96642 Presence of left artificial hip joint: Secondary | ICD-10-CM | POA: Diagnosis present

## 2023-04-04 DIAGNOSIS — I10 Essential (primary) hypertension: Secondary | ICD-10-CM | POA: Diagnosis present

## 2023-04-04 DIAGNOSIS — W19XXXA Unspecified fall, initial encounter: Secondary | ICD-10-CM | POA: Diagnosis not present

## 2023-04-04 DIAGNOSIS — Z8673 Personal history of transient ischemic attack (TIA), and cerebral infarction without residual deficits: Secondary | ICD-10-CM | POA: Diagnosis not present

## 2023-04-04 DIAGNOSIS — I714 Abdominal aortic aneurysm, without rupture, unspecified: Secondary | ICD-10-CM | POA: Diagnosis present

## 2023-04-04 DIAGNOSIS — E785 Hyperlipidemia, unspecified: Secondary | ICD-10-CM | POA: Diagnosis present

## 2023-04-04 DIAGNOSIS — S72001A Fracture of unspecified part of neck of right femur, initial encounter for closed fracture: Secondary | ICD-10-CM | POA: Diagnosis present

## 2023-04-04 DIAGNOSIS — Z96641 Presence of right artificial hip joint: Secondary | ICD-10-CM | POA: Diagnosis not present

## 2023-04-04 DIAGNOSIS — Z79899 Other long term (current) drug therapy: Secondary | ICD-10-CM

## 2023-04-04 DIAGNOSIS — Z823 Family history of stroke: Secondary | ICD-10-CM | POA: Diagnosis not present

## 2023-04-04 DIAGNOSIS — S72041A Displaced fracture of base of neck of right femur, initial encounter for closed fracture: Secondary | ICD-10-CM

## 2023-04-04 DIAGNOSIS — W010XXA Fall on same level from slipping, tripping and stumbling without subsequent striking against object, initial encounter: Secondary | ICD-10-CM | POA: Diagnosis present

## 2023-04-04 DIAGNOSIS — Z833 Family history of diabetes mellitus: Secondary | ICD-10-CM | POA: Diagnosis not present

## 2023-04-04 DIAGNOSIS — S72031A Displaced midcervical fracture of right femur, initial encounter for closed fracture: Secondary | ICD-10-CM | POA: Diagnosis not present

## 2023-04-04 DIAGNOSIS — K219 Gastro-esophageal reflux disease without esophagitis: Secondary | ICD-10-CM | POA: Diagnosis present

## 2023-04-04 DIAGNOSIS — M25551 Pain in right hip: Secondary | ICD-10-CM | POA: Diagnosis not present

## 2023-04-04 DIAGNOSIS — Y9301 Activity, walking, marching and hiking: Secondary | ICD-10-CM | POA: Diagnosis present

## 2023-04-04 DIAGNOSIS — Z8249 Family history of ischemic heart disease and other diseases of the circulatory system: Secondary | ICD-10-CM

## 2023-04-04 DIAGNOSIS — E7800 Pure hypercholesterolemia, unspecified: Secondary | ICD-10-CM | POA: Diagnosis present

## 2023-04-04 DIAGNOSIS — Y92008 Other place in unspecified non-institutional (private) residence as the place of occurrence of the external cause: Secondary | ICD-10-CM

## 2023-04-04 DIAGNOSIS — Z7401 Bed confinement status: Secondary | ICD-10-CM | POA: Diagnosis not present

## 2023-04-04 DIAGNOSIS — Z803 Family history of malignant neoplasm of breast: Secondary | ICD-10-CM

## 2023-04-04 DIAGNOSIS — G8918 Other acute postprocedural pain: Secondary | ICD-10-CM | POA: Diagnosis not present

## 2023-04-04 DIAGNOSIS — S79919A Unspecified injury of unspecified hip, initial encounter: Secondary | ICD-10-CM | POA: Diagnosis not present

## 2023-04-04 DIAGNOSIS — I2510 Atherosclerotic heart disease of native coronary artery without angina pectoris: Secondary | ICD-10-CM | POA: Diagnosis present

## 2023-04-04 DIAGNOSIS — E1151 Type 2 diabetes mellitus with diabetic peripheral angiopathy without gangrene: Secondary | ICD-10-CM | POA: Diagnosis not present

## 2023-04-04 DIAGNOSIS — Z951 Presence of aortocoronary bypass graft: Secondary | ICD-10-CM | POA: Diagnosis not present

## 2023-04-04 DIAGNOSIS — I25118 Atherosclerotic heart disease of native coronary artery with other forms of angina pectoris: Secondary | ICD-10-CM | POA: Diagnosis not present

## 2023-04-04 DIAGNOSIS — S72001D Fracture of unspecified part of neck of right femur, subsequent encounter for closed fracture with routine healing: Secondary | ICD-10-CM | POA: Diagnosis not present

## 2023-04-04 DIAGNOSIS — J929 Pleural plaque without asbestos: Secondary | ICD-10-CM | POA: Diagnosis not present

## 2023-04-04 LAB — COMPREHENSIVE METABOLIC PANEL
ALT: 16 U/L (ref 0–44)
AST: 19 U/L (ref 15–41)
Albumin: 4 g/dL (ref 3.5–5.0)
Alkaline Phosphatase: 68 U/L (ref 38–126)
Anion gap: 12 (ref 5–15)
BUN: 12 mg/dL (ref 8–23)
CO2: 23 mmol/L (ref 22–32)
Calcium: 9.1 mg/dL (ref 8.9–10.3)
Chloride: 102 mmol/L (ref 98–111)
Creatinine, Ser: 1.21 mg/dL (ref 0.61–1.24)
GFR, Estimated: 59 mL/min — ABNORMAL LOW (ref >60)
Glucose, Bld: 152 mg/dL — ABNORMAL HIGH (ref 70–99)
Potassium: 4 mmol/L (ref 3.5–5.1)
Sodium: 137 mmol/L (ref 135–145)
Total Bilirubin: 0.9 mg/dL (ref 0.3–1.2)
Total Protein: 7.5 g/dL (ref 6.5–8.1)

## 2023-04-04 LAB — PROTIME-INR
INR: 1.1 (ref 0.8–1.2)
Prothrombin Time: 14.5 seconds (ref 11.4–15.2)

## 2023-04-04 LAB — CBC WITH DIFFERENTIAL/PLATELET
Abs Immature Granulocytes: 0.03 10*3/uL (ref 0.00–0.07)
Basophils Absolute: 0 10*3/uL (ref 0.0–0.1)
Basophils Relative: 0 %
Eosinophils Absolute: 0.2 10*3/uL (ref 0.0–0.5)
Eosinophils Relative: 2 %
HCT: 50.4 % (ref 39.0–52.0)
Hemoglobin: 17.3 g/dL — ABNORMAL HIGH (ref 13.0–17.0)
Immature Granulocytes: 0 %
Lymphocytes Relative: 8 %
Lymphs Abs: 0.8 10*3/uL (ref 0.7–4.0)
MCH: 30.1 pg (ref 26.0–34.0)
MCHC: 34.3 g/dL (ref 30.0–36.0)
MCV: 87.8 fL (ref 80.0–100.0)
Monocytes Absolute: 0.4 10*3/uL (ref 0.1–1.0)
Monocytes Relative: 4 %
Neutro Abs: 9 10*3/uL — ABNORMAL HIGH (ref 1.7–7.7)
Neutrophils Relative %: 86 %
Platelets: 203 10*3/uL (ref 150–400)
RBC: 5.74 MIL/uL (ref 4.22–5.81)
RDW: 13.6 % (ref 11.5–15.5)
WBC: 10.5 10*3/uL (ref 4.0–10.5)
nRBC: 0 % (ref 0.0–0.2)

## 2023-04-04 LAB — TYPE AND SCREEN
ABO/RH(D): O POS
Antibody Screen: NEGATIVE

## 2023-04-04 LAB — MRSA NEXT GEN BY PCR, NASAL: MRSA by PCR Next Gen: NOT DETECTED

## 2023-04-04 MED ORDER — FENTANYL CITRATE (PF) 100 MCG/2ML IJ SOLN
INTRAMUSCULAR | Status: AC
  Administered 2023-04-04: 50 ug
  Filled 2023-04-04: qty 2

## 2023-04-04 MED ORDER — ONDANSETRON HCL 4 MG/2ML IJ SOLN
4.0000 mg | Freq: Four times a day (QID) | INTRAMUSCULAR | Status: DC | PRN
  Administered 2023-04-04: 4 mg via INTRAVENOUS
  Filled 2023-04-04: qty 2

## 2023-04-04 MED ORDER — MECLIZINE HCL 25 MG PO TABS: 25.0000 mg | ORAL_TABLET | ORAL | Status: DC | PRN

## 2023-04-04 MED ORDER — MORPHINE SULFATE (PF) 2 MG/ML IV SOLN: 0.5000 mg | INTRAVENOUS | Status: DC | PRN

## 2023-04-04 MED ORDER — HYDROCODONE-ACETAMINOPHEN 5-325 MG PO TABS
1.0000 | ORAL_TABLET | Freq: Four times a day (QID) | ORAL | Status: DC | PRN
  Administered 2023-04-06: 2 via ORAL
  Administered 2023-04-08 – 2023-04-10 (×5): 1 via ORAL
  Filled 2023-04-04 (×3): qty 1
  Filled 2023-04-04: qty 2
  Filled 2023-04-04 (×2): qty 1

## 2023-04-04 MED ORDER — MECLIZINE HCL 25 MG PO TABS: 25.0000 mg | ORAL_TABLET | Freq: Three times a day (TID) | ORAL | Status: DC | PRN

## 2023-04-04 MED ORDER — METOPROLOL TARTRATE 25 MG PO TABS
25.0000 mg | ORAL_TABLET | Freq: Every day | ORAL | Status: DC
  Administered 2023-04-04 – 2023-04-09 (×6): 25 mg via ORAL
  Filled 2023-04-04 (×6): qty 1

## 2023-04-04 MED ORDER — ASPIRIN 81 MG PO TBEC
81.0000 mg | DELAYED_RELEASE_TABLET | Freq: Every day | ORAL | Status: DC
  Administered 2023-04-04: 81 mg via ORAL
  Filled 2023-04-04: qty 1

## 2023-04-04 MED ORDER — BUPROPION HCL ER (XL) 150 MG PO TB24
300.0000 mg | ORAL_TABLET | Freq: Every day | ORAL | Status: DC
  Administered 2023-04-04 – 2023-04-10 (×7): 300 mg via ORAL
  Filled 2023-04-04 (×7): qty 2
  Filled 2023-04-04: qty 1

## 2023-04-04 MED ORDER — ROSUVASTATIN CALCIUM 5 MG PO TABS
10.0000 mg | ORAL_TABLET | Freq: Every day | ORAL | Status: DC
  Administered 2023-04-04 – 2023-04-09 (×6): 10 mg via ORAL
  Filled 2023-04-04 (×6): qty 2

## 2023-04-04 MED ORDER — ENOXAPARIN SODIUM 40 MG/0.4ML IJ SOSY
40.0000 mg | PREFILLED_SYRINGE | INTRAMUSCULAR | Status: DC
  Administered 2023-04-04 – 2023-04-09 (×6): 40 mg via SUBCUTANEOUS
  Filled 2023-04-04 (×6): qty 0.4

## 2023-04-04 MED ORDER — AMITRIPTYLINE HCL 25 MG PO TABS
25.0000 mg | ORAL_TABLET | Freq: Every day | ORAL | Status: DC
  Administered 2023-04-04 – 2023-04-09 (×6): 25 mg via ORAL
  Filled 2023-04-04 (×6): qty 1

## 2023-04-04 MED ORDER — CLONIDINE HCL (ANALGESIA) 100 MCG/ML EP SOLN
EPIDURAL | Status: DC | PRN
  Administered 2023-04-04: 80 ug

## 2023-04-04 MED ORDER — ALLOPURINOL 100 MG PO TABS
100.0000 mg | ORAL_TABLET | Freq: Every day | ORAL | Status: DC
  Administered 2023-04-04 – 2023-04-10 (×7): 100 mg via ORAL
  Filled 2023-04-04 (×7): qty 1

## 2023-04-04 MED ORDER — COLCHICINE 0.6 MG PO TABS: 0.6000 mg | ORAL_TABLET | Freq: Every day | ORAL | Status: DC | PRN

## 2023-04-04 MED ORDER — FENTANYL CITRATE PF 50 MCG/ML IJ SOSY
50.0000 ug | PREFILLED_SYRINGE | INTRAMUSCULAR | Status: DC | PRN
  Administered 2023-04-04: 50 ug via INTRAVENOUS
  Filled 2023-04-04: qty 1

## 2023-04-04 MED ORDER — DICLOFENAC SODIUM 1 % EX GEL: 1.0000 | Freq: Three times a day (TID) | CUTANEOUS | Status: DC | PRN

## 2023-04-04 MED ORDER — PANTOPRAZOLE SODIUM 40 MG PO TBEC
40.0000 mg | DELAYED_RELEASE_TABLET | Freq: Every day | ORAL | Status: DC
  Administered 2023-04-04 – 2023-04-09 (×6): 40 mg via ORAL
  Filled 2023-04-04 (×6): qty 1

## 2023-04-04 MED ORDER — DEXAMETHASONE SODIUM PHOSPHATE 4 MG/ML IJ SOLN
INTRAMUSCULAR | Status: DC | PRN
  Administered 2023-04-04: 8 mg via PERINEURAL

## 2023-04-04 MED ORDER — ROPIVACAINE HCL 5 MG/ML IJ SOLN
INTRAMUSCULAR | Status: DC | PRN
  Administered 2023-04-04: 30 mL via PERINEURAL

## 2023-04-04 MED ORDER — SENNOSIDES-DOCUSATE SODIUM 8.6-50 MG PO TABS
1.0000 | ORAL_TABLET | Freq: Every day | ORAL | Status: DC
  Administered 2023-04-04 – 2023-04-09 (×6): 1 via ORAL
  Filled 2023-04-04 (×6): qty 1

## 2023-04-04 NOTE — Anesthesia Preprocedure Evaluation (Signed)
Anesthesia Evaluation  Patient identified by MRN, date of birth, ID band Patient awake    Reviewed: Allergy & Precautions, Patient's Chart, lab work & pertinent test results  Airway Mallampati: II  TM Distance: >3 FB Neck ROM: Full   Comment: Round mass in posterior upper soft palate, with erythema/blackness in color to it Dental  (+) Edentulous Upper, Edentulous Lower   Pulmonary former smoker   Pulmonary exam normal breath sounds clear to auscultation       Cardiovascular hypertension, + CAD, + CABG and + Peripheral Vascular Disease  Normal cardiovascular exam Rhythm:Regular Rate:Normal  Echo 2017  - Left ventricle: The cavity size was normal. Wall thickness was normal. The estimated ejection fraction was = 60%. There was no dynamic obstruction. Wall motion was normal; there were no regional wall motion abnormalities.  - Aortic valve: Cusp separation was reduced. No evidence of vegetation. There was trivial regurgitation directed centrally in the LVOT. There was no significant perivalvular regurgitation.  - Mitral valve: No evidence of vegetation.  - Left atrium: No evidence of thrombus in the atrial cavity or appendage.  - Right atrium: No evidence of thrombus in the atrial cavity or appendage.  - Atrial septum: The septum was thickened. No defect or patent foramen ovale was identified.  - Tricuspid valve: No evidence of vegetation.    Cath 2017  Lat Ramus lesion, 40% stenosed.  Ramus lesion, 90% stenosed.  Ost LAD to Mid LAD lesion, 80% stenosed.  Ost 1st Diag to 1st Diag lesion, 90% stenosed.  Dist LAD lesion, 80% stenosed.  Ost LM to LM lesion, 50% stenosed.  Mid RCA to Dist RCA lesion, 100% stenosed.  Ost RCA to Mid RCA lesion, 80% stenosed.  Prox Cx lesion, 30% stenosed.  The left ventricular systolic function is normal.   1. Severe triple vessel CAD 2. Normal LV systolic function 3. Unstable angina    Recommendations: Will admit to telemetry. Will ask CT surgery to see to discuss CABG. Continue ASA. He is statin intolerant. Will start beta blocker.     Neuro/Psych  PSYCHIATRIC DISORDERS  Depression    CVA, No Residual Symptoms    GI/Hepatic Neg liver ROS,GERD  ,,  Endo/Other  diabetes    Renal/GU Lab Results      Component                Value               Date                      CREATININE               1.08                02/09/2022                K                        3.9                 02/09/2022                    Musculoskeletal  (+) Arthritis ,    Abdominal   Peds  Hematology Lab Results      Component                Value  Date                         HGB                      16.4                02/09/2022                HCT                      46.8                02/09/2022                   PLT                      209                 02/09/2022              Anesthesia Other Findings Patient complaining of left eye burning prior to induction  Reproductive/Obstetrics                             Anesthesia Physical Anesthesia Plan  ASA: 3  Anesthesia Plan: Regional   Post-op Pain Management: Regional block*   Induction:   PONV Risk Score and Plan: 0  Airway Management Planned:   Additional Equipment:   Intra-op Plan:   Post-operative Plan:   Informed Consent: I have reviewed the patients History and Physical, chart, labs and discussed the procedure including the risks, benefits and alternatives for the proposed anesthesia with the patient or authorized representative who has indicated his/her understanding and acceptance.       Plan Discussed with:   Anesthesia Plan Comments: (preop femoral block by Dr. Collins Scotland. Plan for Spinal vs. GETA depending on patient positioning. Tanna Furry, MD  )        Anesthesia Quick Evaluation

## 2023-04-04 NOTE — ED Provider Notes (Signed)
[place] EMERGENCY DEPARTMENT AT [facility] Provider Note   CSN: [id number] Arrival date & time: 04/04/23  3329     History  Chief Complaint  Patient presents with   Trevor Ward is a 82 y.o. male.  HPI Presents with leg pain.  Patient fell yesterday, unclear why he waited until today, but with inability to walk, right hip pain who presents for evaluation.  He notes the fall was mechanical, while he was walking around the house.  No head trauma, shoulder trauma, no head, neck, chest pain.     Home Medications Prior to Admission medications   Medication Sig Start Date End Date Taking? Authorizing Provider  allopurinol (ZYLOPRIM) 100 MG tablet Take 100 mg by mouth daily.  01/13/16  Yes [provider]  amitriptyline (ELAVIL) 25 MG tablet Take 25 mg by mouth at bedtime. 04/19/19  Yes [provider]  buPROPion (WELLBUTRIN XL) 300 MG 24 hr tablet Take 300 mg by mouth daily. 05/19/19  Yes [provider]  colchicine 0.6 MG tablet Take 0.6-1.2 mg by mouth daily as needed (gout). 03/27/16  Yes [provider]  Glucosamine Sulfate 1000 MG TABS Take 1,000 mg by mouth in the morning and at bedtime.   Yes [provider]  meclizine (ANTIVERT) 25 MG tablet Take 25 mg by mouth as needed for dizziness or nausea. 02/05/18  Yes [provider]  metoprolol tartrate (LOPRESSOR) 25 MG tablet TAKE 1 TABLET TWO TIMES DAILY. MAKE OFFICE VISIT FOR MORE REFILLS. Patient taking differently: Take 25 mg by mouth 2 (two) times daily. 12/26/21   Croitoru, Mihai, MD  pantoprazole (PROTONIX) 40 MG tablet Take 1 tablet (40 mg total) by mouth daily. 01/01/17   Azalee Course, PA  polyethylene glycol (MIRALAX / GLYCOLAX) 17 g packet Take 17 g by mouth daily as needed for moderate constipation or severe constipation. 02/13/22   Amin, Loura Halt, MD  rosuvastatin (CRESTOR) 10 MG tablet Take 10 mg by mouth daily. 12/01/20   [provider]  senna  (SENOKOT) 8.6 MG TABS tablet Take 1 tablet (8.6 mg total) by mouth at bedtime as needed for mild constipation. 02/13/22   Amin, Loura Halt, MD  vitamin B-12 (CYANOCOBALAMIN) 500 MCG tablet Take 500 mcg by mouth daily.    [provider]      Allergies    Statins and Mirtazapine    Review of Systems   Review of Systems  All other systems reviewed and are negative.   Physical Exam Updated Vital Signs BP 136/72   Pulse 90   Temp 98.2 F (36.8 C) (Oral)   Resp (!) 23   Ht 5\' 11"  (1.803 m)   Wt 90.7 kg   SpO2 96%   BMI 27.89 kg/m  Physical Exam Vitals and nursing note reviewed.  Constitutional:      General: He is not in acute distress.    Appearance: He is well-developed.  HENT:     Head: Normocephalic and atraumatic.  Eyes:     Conjunctiva/sclera: Conjunctivae normal.  Cardiovascular:     Rate and Rhythm: Normal rate and regular rhythm.  Pulmonary:     Effort: Pulmonary effort is normal. No respiratory distress.     Breath sounds: No stridor.  Abdominal:     General: There is no distension.  Musculoskeletal:       Legs:  Skin:    General: Skin is warm and dry.  Neurological:  Mental Status: He is alert and oriented to person, place, and time.     ED Results / Procedures / Treatments   Labs (all labs ordered are listed, but only abnormal results are displayed) Labs Reviewed  CBC WITH DIFFERENTIAL/PLATELET - Abnormal; Notable for the following components:      Result Value   Hemoglobin 17.3 (*)    Neutro Abs 9.0 (*)    All other components within normal limits  COMPREHENSIVE METABOLIC PANEL - Abnormal; Notable for the following components:   Glucose, Bld 152 (*)    GFR, Estimated 59 (*)    All other components within normal limits  PROTIME-INR  TYPE AND SCREEN    EKG EKG Interpretation Date/Time:  Wednesday April 04 2023 09:40:25 EDT Ventricular Rate:  92 PR Interval:  165 QRS Duration:  95 QT Interval:  378 QTC Calculation: 468 R  Axis:   -25  Text Interpretation: Sinus rhythm Probable left atrial enlargement Abnormal R-wave progression, early transition LVH with secondary repolarization abnormality Confirmed by Gerhard Munch [phone]) on 04/04/2023 9:53:04 AM  Radiology DG Chest 1 View  Result Date: 04/04/2023 CLINICAL DATA:  fracture EXAM: CHEST  1 VIEW COMPARISON:  March 18, 2019. FINDINGS: The heart size and mediastinal contours are within normal limits. Both lungs are clear. No visible pleural effusions or pneumothorax. No acute osseous abnormality. Chronic calcified pleural plaques bilaterally. IMPRESSION: No active disease.  Calcified pleural plaques. Electronically Signed   By: Feliberto Harts M.D.   On: 04/04/2023 11:10   DG Hip Unilat With Pelvis 2-3 Views Right  Result Date: 04/04/2023 CLINICAL DATA:  Fall, right hip pain. EXAM: DG HIP (WITH OR WITHOUT PELVIS) 2-3V RIGHT COMPARISON:  None Available. FINDINGS: There is an acute fracture through the right femoral neck with mild proximal displacement. Femoroacetabular alignment is maintained. The patient is status post left hip arthroplasty. There is no perihardware fracture to the level imaged. The SI joints and symphysis pubis are intact. IMPRESSION: Acute mildly displaced right femoral neck fracture. Electronically Signed   By: Lesia Hausen M.D.   On: 04/04/2023 11:10    Procedures Procedures    Medications Ordered in ED Medications  ondansetron (ZOFRAN) injection 4 mg (4 mg Intravenous Given 04/04/23 1149)  HYDROcodone-acetaminophen (NORCO/VICODIN) 5-325 MG per tablet 1-2 tablet (has no administration in time range)  morphine (PF) 2 MG/ML injection 0.5 mg (has no administration in time range)  enoxaparin (LOVENOX) injection 40 mg (has no administration in time range)  senna-docusate (Senokot-S) tablet 1 tablet (has no administration in time range)    ED Course/ Medical Decision Making/ A&P                             Medical Decision Making Elderly  male with multiple medical problems, not anticoagulated presents after a fall that occurred yesterday.  Though the patient waited a prolonged amount of time, suspicion for fracture persists, and on reviewing the patient's x-rays, discussing it with him and his wife, patient was found to have right femoral neck fracture.  I also discussed the patient's presentation with her orthopedic colleagues, internal medicine colleagues.  Labs ordered, reviewed, noncontributory.  Patient admitted for fall, right hip fracture.  Amount and/or Complexity of Data Reviewed Independent Historian: spouse and EMS Labs: ordered. Decision-making details documented in ED Course. Radiology: ordered and independent interpretation performed. Decision-making details documented in ED Course. ECG/medicine tests: ordered and independent interpretation performed. Decision-making details documented in ED  Course.  Risk Prescription drug management. Decision regarding hospitalization.  Final Clinical Impression(s) / ED Diagnoses Final diagnoses:  Fall, initial encounter  Closed fracture of right hip, initial encounter [facility])    Rx / DC Orders ED Discharge Orders     None         Gerhard Munch, MD 04/04/23 1231

## 2023-04-04 NOTE — Anesthesia Procedure Notes (Signed)
Anesthesia Regional Block: Femoral nerve block   Pre-Anesthetic Checklist: , timeout performed,  Correct Patient, Correct Site, Correct Laterality,  Correct Procedure, Correct Position, site marked,  Risks and benefits discussed,  Surgical consent,  Pre-op evaluation,  At surgeon's request and post-op pain management  Laterality: Lower and Right  Prep: chloraprep       Needles:  Injection technique: Single-shot  Needle Type: Stimulator Needle - 80     Needle Length: 9cm  Needle Gauge: 22   Needle insertion depth: 6 cm   Additional Needles:   Procedures:, nerve stimulator,,, ultrasound used (permanent image in chart),,    Narrative:  Start time: 04/04/2023 1:20 PM End time: 04/04/2023 1:50 PM Injection made incrementally with aspirations every 5 mL.  Performed by: Personally  Anesthesiologist: Lewie Loron, MD  Additional Notes: BP cuff, EKG monitors applied. Timeout performed. Sedation begun. After nerve location verified with U/S, anesthetic injected incrementally, slowly, and after negative aspirations under direct u/s guidance. Good perineural spread. Patient tolerated well.

## 2023-04-04 NOTE — Plan of Care (Signed)
  Problem: Activity: Goal: Risk for activity intolerance will decrease Outcome: Progressing   Problem: Nutrition: Goal: Adequate nutrition will be maintained Outcome: Progressing   Problem: Coping: Goal: Level of anxiety will decrease Outcome: Progressing   

## 2023-04-04 NOTE — Consult Note (Signed)
Reason for Consult:Right hip fx Referring Physician: Gerhard Munch Time called: 1033 Time at bedside: 1043   Trevor Ward is an 82 y.o. male.  HPI: Trevor Ward fell at home when he slipped in his sock feet. He had immediate right hip pain and could not get up. He was brought to the ED where x-rays showed a right hip fx and orthopedic surgery was consulted. He lives at home with his wife and does not use any assistive devices to ambulate.  Past Medical History:  Diagnosis Date   Arthritis    "fingers" (02/01/2016)   Depression    Diabetes mellitus without complication (HCC)    GERD (gastroesophageal reflux disease)    Hard of hearing    High cholesterol    "can't take RX; made my legs weak" (02/01/2016)   History of gout    History of hiatal hernia    Hypertension    Peripheral vascular disease (HCC)    carotid artery stenosis   Skin cancer of face     "the good kind"   Stroke [facility]) 2016   denies residual on 02/01/2016    Past Surgical History:  Procedure Laterality Date   CARDIAC CATHETERIZATION N/A 02/01/2016   Procedure: Left Heart Cath and Coronary Angiography;  Surgeon: Kathleene Hazel, MD;  Location: [facility] INVASIVE CV LAB;  Service: Cardiovascular;  Laterality: N/A;   COLONOSCOPY     CORONARY ARTERY BYPASS GRAFT N/A 02/02/2016   Procedure: CORONARY ARTERY BYPASS GRAFTING (CABG) times 4 using left internal mammary and left saphenous ven harvested by endoven ;  Surgeon: Delight Ovens, MD;  Location: [facility] OR;  Service: Open Heart Surgery;  Laterality: N/A;   ENDARTERECTOMY Right 04/21/2015   Procedure: RIGHT CAROTID ENDARTERECTOMY;  Surgeon: Sherren Kerns, MD;  Location[facility] OR;  Service: Vascular;  Laterality: Right;   ESOPHAGOGASTRODUODENOSCOPY (EGD) WITH PROPOFOL N/A 01/03/2016   Procedure: ESOPHAGOGASTRODUODENOSCOPY (EGD) WITH PROPOFOL;  Surgeon: Charolett Bumpers, MD;  Location: [facility] ENDOSCOPY;  Service: Endoscopy;  Laterality: N/A;   KNEE ARTHROSCOPY Right    open heart  surgery  01/2016   PERIPHERAL VASCULAR CATHETERIZATION Bilateral 04/14/2015   Procedure: Carotid Angiography;  Surgeon: Nada Libman, MD;  Location: [facility] INVASIVE CV LAB;  Service: Cardiovascular;  Laterality: Bilateral;   SKIN CANCER EXCISION Right    "temple"   TEE WITHOUT CARDIOVERSION N/A 02/02/2016   Procedure: TRANSESOPHAGEAL ECHOCARDIOGRAM (TEE);  Surgeon: Delight Ovens, MD;  Location: [facility] OR;  Service: Open Heart Surgery;  Laterality: N/A;   TOTAL HIP ARTHROPLASTY Left 02/09/2022   Procedure: TOTAL HIP ARTHROPLASTY ANTERIOR APPROACH;  Surgeon: Samson Frederic, MD;  Location: [facility] ORS;  Service: Orthopedics;  Laterality: Left;    Family History  Problem Relation Age of Onset   CAD Mother    CVA Mother    Diabetes Mother    Breast cancer Mother    Parkinson's disease Mother    Arthritis Mother    Heart attack Father    CVA Sister    CAD Brother    Diabetes Brother    CVA Brother    CAD Brother    Diabetes Brother     Social History:  reports that he has quit smoking. His smoking use included cigarettes. He started smoking about 67 years ago. He has never used smokeless tobacco. He reports that he does not drink alcohol and does not use drugs.  Allergies:  Allergies  Allergen Reactions   Statins Other (See Comments)    Severe  myalgias   Mirtazapine     Other reaction(s): felt bad    Medications: I have reviewed the patient's current medications.  Results for orders placed or performed during the hospital encounter of 04/04/23 (from the past 48 hour(s))  CBC with Differential     Status: Abnormal   Collection Time: 04/04/23  9:45 AM  Result Value Ref Range   WBC 10.5 4.0 - 10.5 K/uL   RBC 5.74 4.22 - 5.81 MIL/uL   Hemoglobin 17.3 (H) 13.0 - 17.0 g/dL   HCT 19.1 47.8 - 29.5 %   MCV 87.8 80.0 - 100.0 fL   MCH 30.1 26.0 - 34.0 pg   MCHC 34.3 30.0 - 36.0 g/dL   RDW 62.1 30.8 - 65.7 %   Platelets 203 150 - 400 K/uL   nRBC 0.0 0.0 - 0.2 %   Neutrophils Relative %  86 %   Neutro Abs 9.0 (H) 1.7 - 7.7 K/uL   Lymphocytes Relative 8 %   Lymphs Abs 0.8 0.7 - 4.0 K/uL   Monocytes Relative 4 %   Monocytes Absolute 0.4 0.1 - 1.0 K/uL   Eosinophils Relative 2 %   Eosinophils Absolute 0.2 0.0 - 0.5 K/uL   Basophils Relative 0 %   Basophils Absolute 0.0 0.0 - 0.1 K/uL   Immature Granulocytes 0 %   Abs Immature Granulocytes 0.03 0.00 - 0.07 K/uL    Comment: Performed at [facility], [address] [place], [place] [address]  Type and screen [facility]     Status: None (Preliminary result)   Collection Time: 04/04/23 10:05 AM  Result Value Ref Range   ABO/RH(D) PENDING    Antibody Screen PENDING    Sample Expiration      04/07/2023,2359 Performed at [facility], [address], [place], [place] [address]     No results found.  Review of Systems  HENT:  Negative for ear discharge, ear pain, hearing loss and tinnitus.   Eyes:  Negative for photophobia and pain.  Respiratory:  Negative for cough and shortness of breath.   Cardiovascular:  Negative for chest pain.  Gastrointestinal:  Negative for abdominal pain, nausea and vomiting.  Genitourinary:  Negative for dysuria, flank pain, frequency and urgency.  Musculoskeletal:  Positive for arthralgias (Right hip). Negative for back pain, myalgias and neck pain.  Neurological:  Negative for dizziness and headaches.  Hematological:  Does not bruise/bleed easily.  Psychiatric/Behavioral:  The patient is not nervous/anxious.    Blood pressure (!) 140/77, pulse 90, temperature 98.2 F (36.8 C), temperature source Oral, resp. rate (!) 23, height 5\' 11"  (1.803 m), weight 90.7 kg, SpO2 94 %. Physical Exam Constitutional:      General: He is not in acute distress.    Appearance: He is well-developed. He is not diaphoretic.  HENT:     Head: Normocephalic and atraumatic.  Eyes:     General: No scleral icterus.       Right eye: No discharge.        Left eye: No discharge.      Conjunctiva/sclera: Conjunctivae normal.  Cardiovascular:     Rate and Rhythm: Normal rate and regular rhythm.  Pulmonary:     Effort: Pulmonary effort is normal. No respiratory distress.  Musculoskeletal:     Cervical back: Normal range of motion.     Comments: RLE No traumatic wounds, ecchymosis, or rash  Mod TTP hip  No knee or ankle effusion  Knee  stable to varus/ valgus and anterior/posterior stress  Sens DPN, SPN, TN intact  Motor EHL, ext, flex, evers 5/5  DP 1+, PT 1+, No significant edema  Skin:    General: Skin is warm and dry.  Neurological:     Mental Status: He is alert.  Psychiatric:        Mood and Affect: Mood normal.        Behavior: Behavior normal.    Assessment/Plan: Right hip fx -- Plan THA vs hemi tomorrow with Dr. Magnus Ivan if room available. Please keep NPO after MN.    Freeman Caldron, PA-C Orthopedic Surgery [phone] 04/04/2023, 10:49 AM

## 2023-04-04 NOTE — ED Notes (Signed)
Help get patient into a gown on the monitor did EKG shown to Dr lockwood patient is resting with call bell in reach

## 2023-04-04 NOTE — ED Notes (Signed)
ED TO INPATIENT HANDOFF REPORT  ED Nurse Name and Phone # Damarys Speir [phone]  S Name/Age/Gender Trevor Ward 82 y.o. male Room/Bed: 044C/044C  Code Status   Code Status: Full Code  Home/SNF/Other Home Patient oriented to: self, place, time, and situation Is this baseline? Yes   Triage Complete: Triage complete  Chief Complaint Closed displaced fracture of right femoral neck (HCC) [S72.001A]  Triage Note Slipped and fell while walking in the house last night. Pt landed on his right side. Complains of right hip pain with shortening noted to right leg. No LOC. Denies hitting head. Denies taking blood thinners.    Allergies Allergies  Allergen Reactions   Statins Other (See Comments)    Severe myalgias   Mirtazapine Other (See Comments)    Felt bad    Level of Care/Admitting Diagnosis ED Disposition     ED Disposition  Admit   Condition  --   Comment  H[facility] [[id number]]  Level of Care: Telemetry Surgical [[id number]]  May admit patient to [facility] or [facility] if equivalent level of care is available:: No  Covid Evaluation: Asymptomatic - no recent exposure (last 10 days) testing not required  Diagnosis: Closed displaced fracture of right femoral neck [facility]) [[id number]]  Admitting Physician: Clydie Braun [[id number]]  Attending Physician: Clydie Braun [[id number]]  Certification:: I certify this patient will need inpatient services for at least 2 midnights  Estimated Length of Stay: 3          B Medical/Surgery History Past Medical History:  Diagnosis Date   Arthritis    "fingers" (02/01/2016)   Depression    Diabetes mellitus without complication (HCC)    GERD (gastroesophageal reflux disease)    Hard of hearing    High cholesterol    "can't take RX; made my legs weak" (02/01/2016)   History of gout    History of hiatal hernia    Hypertension    Peripheral vascular disease (HCC)    carotid artery stenosis   Skin cancer of  face     "the good kind"   Stroke [facility]) 2016   denies residual on 02/01/2016   Past Surgical History:  Procedure Laterality Date   CARDIAC CATHETERIZATION N/A 02/01/2016   Procedure: Left Heart Cath and Coronary Angiography;  Surgeon: Kathleene Hazel, MD;  Location: [facility] INVASIVE CV LAB;  Service: Cardiovascular;  Laterality: N/A;   COLONOSCOPY     CORONARY ARTERY BYPASS GRAFT N/A 02/02/2016   Procedure: CORONARY ARTERY BYPASS GRAFTING (CABG) times 4 using left internal mammary and left saphenous ven harvested by endoven ;  Surgeon: Delight Ovens, MD;  Location: [facility] OR;  Service: Open Heart Surgery;  Laterality: N/A;   ENDARTERECTOMY Right 04/21/2015   Procedure: RIGHT CAROTID ENDARTERECTOMY;  Surgeon: Sherren Kerns, MD;  Location: [facility] OR;  Service: Vascular;  Laterality: Right;   ESOPHAGOGASTRODUODENOSCOPY (EGD) WITH PROPOFOL N/A 01/03/2016   Procedure: ESOPHAGOGASTRODUODENOSCOPY (EGD) WITH PROPOFOL;  Surgeon: Charolett Bumpers, MD;  Location: [facility] ENDOSCOPY;  Service: Endoscopy;  Laterality: N/A;   KNEE ARTHROSCOPY Right    open heart surgery  01/2016   PERIPHERAL VASCULAR CATHETERIZATION Bilateral 04/14/2015   Procedure: Carotid Angiography;  Surgeon: Nada Libman, MD;  Location: [facility] INVASIVE CV LAB;  Service: Cardiovascular;  Laterality: Bilateral;   SKIN CANCER EXCISION Right    "temple"   TEE WITHOUT CARDIOVERSION N/A 02/02/2016   Procedure: TRANSESOPHAGEAL ECHOCARDIOGRAM (TEE);  Surgeon: Delight Ovens, MD;  Location: [facility] OR;  Service: Open Heart Surgery;  Laterality: N/A;   TOTAL HIP ARTHROPLASTY Left 02/09/2022   Procedure: TOTAL HIP ARTHROPLASTY ANTERIOR APPROACH;  Surgeon: Samson Frederic, MD;  Location: [facility] ORS;  Service: Orthopedics;  Laterality: Left;     A IV Location/Drains/Wounds Patient Lines/Drains/Airways Status     Active Line/Drains/Airways     Name Placement date Placement time Site Days   Peripheral IV 04/04/23 20 G Right Antecubital 04/04/23  1002   Antecubital  less than 1   Incision (Closed) 02/09/22 Hip Left 02/09/22  1512  -- 419            Intake/Output Last 24 hours No intake or output data in the 24 hours ending 04/04/23 1426  Labs/Imaging Results for orders placed or performed during the hospital encounter of 04/04/23 (from the past 48 hour(s))  CBC with Differential     Status: Abnormal   Collection Time: 04/04/23  9:45 AM  Result Value Ref Range   WBC 10.5 4.0 - 10.5 K/uL   RBC 5.74 4.22 - 5.81 MIL/uL   Hemoglobin 17.3 (H) 13.0 - 17.0 g/dL   HCT 16.1 09.6 - 04.5 %   MCV 87.8 80.0 - 100.0 fL   MCH 30.1 26.0 - 34.0 pg   MCHC 34.3 30.0 - 36.0 g/dL   RDW 40.9 81.1 - 91.4 %   Platelets 203 150 - 400 K/uL   nRBC 0.0 0.0 - 0.2 %   Neutrophils Relative % 86 %   Neutro Abs 9.0 (H) 1.7 - 7.7 K/uL   Lymphocytes Relative 8 %   Lymphs Abs 0.8 0.7 - 4.0 K/uL   Monocytes Relative 4 %   Monocytes Absolute 0.4 0.1 - 1.0 K/uL   Eosinophils Relative 2 %   Eosinophils Absolute 0.2 0.0 - 0.5 K/uL   Basophils Relative 0 %   Basophils Absolute 0.0 0.0 - 0.1 K/uL   Immature Granulocytes 0 %   Abs Immature Granulocytes 0.03 0.00 - 0.07 K/uL    Comment: Performed at [facility] Lab, [address] [place], [place] [address]  Protime-INR     Status: None   Collection Time: 04/04/23  9:45 AM  Result Value Ref Range   Prothrombin Time 14.5 11.4 - 15.2 seconds   INR 1.1 0.8 - 1.2    Comment: (NOTE) INR goal varies based on device and disease states. Performed at [facility] Lab, [address] [place], [place] [address]   Comprehensive metabolic panel     Status: Abnormal   Collection Time: 04/04/23  9:45 AM  Result Value Ref Range   Sodium 137 135 - 145 mmol/L   Potassium 4.0 3.5 - 5.1 mmol/L   Chloride 102 98 - 111 mmol/L   CO2 23 22 - 32 mmol/L   Glucose, Bld 152 (H) 70 - 99 mg/dL    Comment: Glucose reference range applies only to samples taken after fasting for at least 8 hours.   BUN 12 8 - 23 mg/dL    Creatinine, Ser 8.65 0.61 - 1.24 mg/dL   Calcium 9.1 8.9 - 78.4 mg/dL   Total Protein 7.5 6.5 - 8.1 g/dL   Albumin 4.0 3.5 - 5.0 g/dL   AST 19 15 - 41 U/L   ALT 16 0 - 44 U/L   Alkaline Phosphatase 68 38 - 126 U/L   Total Bilirubin 0.9 0.3 - 1.2 mg/dL   GFR, Estimated 59 (L) >60 mL/min    Comment: (NOTE) Calculated  using the CKD-EPI Creatinine Equation (2021)    Anion gap 12 5 - 15    Comment: Performed at [facility], [address] [place], [place] [address]  Type and screen [facility]     Status: None   Collection Time: 04/04/23 10:05 AM  Result Value Ref Range   ABO/RH(D) O POS    Antibody Screen NEG    Sample Expiration      04/07/2023,2359 Performed at [facility] Lab, [address], [place], [place] [address]    DG Chest 1 View  Result Date: 04/04/2023 CLINICAL DATA:  fracture EXAM: CHEST  1 VIEW COMPARISON:  March 18, 2019. FINDINGS: The heart size and mediastinal contours are within normal limits. Both lungs are clear. No visible pleural effusions or pneumothorax. No acute osseous abnormality. Chronic calcified pleural plaques bilaterally. IMPRESSION: No active disease.  Calcified pleural plaques. Electronically Signed   By: Feliberto Harts M.D.   On: 04/04/2023 11:10   DG Hip Unilat With Pelvis 2-3 Views Right  Result Date: 04/04/2023 CLINICAL DATA:  Fall, right hip pain. EXAM: DG HIP (WITH OR WITHOUT PELVIS) 2-3V RIGHT COMPARISON:  None Available. FINDINGS: There is an acute fracture through the right femoral neck with mild proximal displacement. Femoroacetabular alignment is maintained. The patient is status post left hip arthroplasty. There is no perihardware fracture to the level imaged. The SI joints and symphysis pubis are intact. IMPRESSION: Acute mildly displaced right femoral neck fracture. Electronically Signed   By: Lesia Hausen M.D.   On: 04/04/2023 11:10    Pending Labs Unresulted Labs (From admission, onward)     Start      Ordered   04/05/23 0500  CBC  Tomorrow morning,   R        04/04/23 1203   04/05/23 0500  Basic metabolic panel  Tomorrow morning,   R        04/04/23 1203            Vitals/Pain Today's Vitals   04/04/23 1345 04/04/23 1352 04/04/23 1402 04/04/23 1411  BP: 120/75  (!) 143/74 137/72  Pulse: 94 92 94 96  Resp: (!) 21 20 18 20   Temp:    98.7 F (37.1 C)  TempSrc:      SpO2: 92%  92% 93%  Weight:      Height:      PainSc:        Isolation Precautions No active isolations  Medications Medications  ondansetron (ZOFRAN) injection 4 mg (4 mg Intravenous Given 04/04/23 1149)  HYDROcodone-acetaminophen (NORCO/VICODIN) 5-325 MG per tablet 1-2 tablet (has no administration in time range)  morphine (PF) 2 MG/ML injection 0.5 mg (has no administration in time range)  enoxaparin (LOVENOX) injection 40 mg (has no administration in time range)  senna-docusate (Senokot-S) tablet 1 tablet (has no administration in time range)  allopurinol (ZYLOPRIM) tablet 100 mg (has no administration in time range)  colchicine tablet 0.6-1.2 mg (has no administration in time range)  aspirin EC tablet 81 mg (has no administration in time range)  rosuvastatin (CRESTOR) tablet 10 mg (has no administration in time range)  metoprolol tartrate (LOPRESSOR) tablet 25 mg (has no administration in time range)  amitriptyline (ELAVIL) tablet 25 mg (has no administration in time range)  buPROPion (WELLBUTRIN XL) 24 hr tablet 300 mg (has no administration in time range)  pantoprazole (PROTONIX) EC tablet 40 mg (has no administration in time range)  diclofenac Sodium (VOLTAREN) 1 %  topical gel 1 Application (has no administration in time range)  meclizine (ANTIVERT) tablet 25 mg (has no administration in time range)  fentaNYL (SUBLIMAZE) 100 MCG/2ML injection (50 mcg  Given 04/04/23 1345)    Mobility:  nonambulatory      Focused Assessments Musculoskeletal   R Recommendations: See Admitting Provider  Note  Report given to:   Additional Notes:

## 2023-04-04 NOTE — Anesthesia Postprocedure Evaluation (Signed)
Anesthesia Post Note  Patient: Trevor Ward  Procedure(s) Performed: AN AD HOC NERVE BLOCK     Patient location during evaluation: PACU Anesthesia Type: Regional Level of consciousness: awake and alert Pain management: pain level controlled Vital Signs Assessment: post-procedure vital signs reviewed and stable Respiratory status: spontaneous breathing Cardiovascular status: stable Anesthetic complications: no Comments: Significant improvement in pain post block.   No notable events documented.  Last Vitals:  Vitals:   04/04/23 1411 04/04/23 1437  BP: 137/72 139/72  Pulse: 96 89  Resp: 20 18  Temp: 37.1 C 37 C  SpO2: 93% 95%    Last Pain:  Vitals:   04/04/23 1437  TempSrc: Oral  PainSc:                  Lewie Loron

## 2023-04-04 NOTE — ED Triage Notes (Signed)
Slipped and fell while walking in the house last night. Pt landed on his right side. Complains of right hip pain with shortening noted to right leg. No LOC. Denies hitting head. Denies taking blood thinners.

## 2023-04-04 NOTE — H&P (Signed)
History and Physical    Patient: Trevor Ward:[id number] DOB: 01/29/1941 DOA: 04/04/2023 DOS: the patient was seen and examined on 04/04/2023 PCP: Lupita Raider, MD  Patient coming from: Home  Chief Complaint:  Chief Complaint  Patient presents with   Fall   HPI: CHAVEZ ROSOL is a 82 y.o. male with medical history significant of hypertension, CAD s/p CABG, PAD, AAA, gait disturbance, and mild dementia who presents after having a fall at home.  Patient lives at home with his wife and normally gets around without use of any assistive devices although wife makes note that he should.  He had been wearing socks on a slick floor when he had been down to do something with the dogs and fell.  Landed on his right hip and reported having immediate pain.  He was unable to bear weight or walk due to the pain.  Denied any loss of consciousness or trauma to his head.  He is wife makes note that he previously fractured with a left hip in 01/2022.    In the emergency department patient was noted to be afebrile with mild tachypnea and all other vital signs relatively maintained.  Labs noted WBC 10.5 and hemoglobin 17.31.  Chest x-ray showed no acute abnormality.  X-rays of the pelvis noted a acute mildly displaced right femoral neck fracture.  Orthopedics had been consulted.  Review of Systems: As mentioned in the history of present illness. All other systems reviewed and are negative. Past Medical History:  Diagnosis Date   Arthritis    "fingers" (02/01/2016)   Depression    Diabetes mellitus without complication (HCC)    GERD (gastroesophageal reflux disease)    Hard of hearing    High cholesterol    "can't take RX; made my legs weak" (02/01/2016)   History of gout    History of hiatal hernia    Hypertension    Peripheral vascular disease (HCC)    carotid artery stenosis   Skin cancer of face     "the good kind"   Stroke [facility]) 2016   denies residual on 02/01/2016   Past Surgical  History:  Procedure Laterality Date   CARDIAC CATHETERIZATION N/A 02/01/2016   Procedure: Left Heart Cath and Coronary Angiography;  Surgeon: Kathleene Hazel, MD;  Location: [facility] INVASIVE CV LAB;  Service: Cardiovascular;  Laterality: N/A;   COLONOSCOPY     CORONARY ARTERY BYPASS GRAFT N/A 02/02/2016   Procedure: CORONARY ARTERY BYPASS GRAFTING (CABG) times 4 using left internal mammary and left saphenous ven harvested by endoven ;  Surgeon: Delight Ovens, MD;  Location: [facility] OR;  Service: Open Heart Surgery;  Laterality: N/A;   ENDARTERECTOMY Right 04/21/2015   Procedure: RIGHT CAROTID ENDARTERECTOMY;  Surgeon: Sherren Kerns, MD;  Location: [facility] OR;  Service: Vascular;  Laterality: Right;   ESOPHAGOGASTRODUODENOSCOPY (EGD) WITH PROPOFOL N/A 01/03/2016   Procedure: ESOPHAGOGASTRODUODENOSCOPY (EGD) WITH PROPOFOL;  Surgeon: Charolett Bumpers, MD;  Location: [facility] ENDOSCOPY;  Service: Endoscopy;  Laterality: N/A;   KNEE ARTHROSCOPY Right    open heart surgery  01/2016   PERIPHERAL VASCULAR CATHETERIZATION Bilateral 04/14/2015   Procedure: Carotid Angiography;  Surgeon: Nada Libman, MD;  Location: [facility] INVASIVE CV LAB;  Service: Cardiovascular;  Laterality: Bilateral;   SKIN CANCER EXCISION Right    "temple"   TEE WITHOUT CARDIOVERSION N/A 02/02/2016   Procedure: TRANSESOPHAGEAL ECHOCARDIOGRAM (TEE);  Surgeon: Delight Ovens, MD;  Location[facility] OR;  Service: Open Heart Surgery;  Laterality:  N/A;   TOTAL HIP ARTHROPLASTY Left 02/09/2022   Procedure: TOTAL HIP ARTHROPLASTY ANTERIOR APPROACH;  Surgeon: Samson Frederic, MD;  Location: [facility] ORS;  Service: Orthopedics;  Laterality: Left;   Social History:  reports that he has quit smoking. His smoking use included cigarettes. He started smoking about 67 years ago. He has never used smokeless tobacco. He reports that he does not drink alcohol and does not use drugs.  Allergies  Allergen Reactions   Statins Other (See Comments)    Severe myalgias    Mirtazapine     Other reaction(s): felt bad    Family History  Problem Relation Age of Onset   CAD Mother    CVA Mother    Diabetes Mother    Breast cancer Mother    Parkinson's disease Mother    Arthritis Mother    Heart attack Father    CVA Sister    CAD Brother    Diabetes Brother    CVA Brother    CAD Brother    Diabetes Brother     Prior to Admission medications   Medication Sig Start Date End Date Taking? Authorizing Provider  allopurinol (ZYLOPRIM) 100 MG tablet Take 100 mg by mouth daily.  01/13/16   [provider]  amitriptyline (ELAVIL) 25 MG tablet Take 25 mg by mouth at bedtime. 04/19/19   [provider]  buPROPion (WELLBUTRIN XL) 300 MG 24 hr tablet  05/19/19   [provider]  colchicine 0.6 MG tablet  03/27/16   [provider]  docusate sodium (COLACE) 100 MG capsule Take 1 capsule (100 mg total) by mouth 2 (two) times daily. 02/13/22   Amin, Loura Halt, MD  Glucosamine Sulfate 1000 MG TABS Take 2,000 mg by mouth daily.    [provider]  meclizine (ANTIVERT) 25 MG tablet Take 25 mg by mouth 4 (four) times daily as needed for dizziness. 02/05/18   [provider]  metoprolol tartrate (LOPRESSOR) 25 MG tablet TAKE 1 TABLET TWO TIMES DAILY. MAKE OFFICE VISIT FOR MORE REFILLS. Patient taking differently: Take 25 mg by mouth 2 (two) times daily. 12/26/21   Croitoru, Mihai, MD  pantoprazole (PROTONIX) 40 MG tablet Take 1 tablet (40 mg total) by mouth daily. 01/01/17   Azalee Course, PA  polyethylene glycol (MIRALAX / GLYCOLAX) 17 g packet Take 17 g by mouth daily as needed for moderate constipation or severe constipation. 02/13/22   Amin, Loura Halt, MD  rosuvastatin (CRESTOR) 10 MG tablet Take 10 mg by mouth daily. 12/01/20   [provider]  senna (SENOKOT) 8.6 MG TABS tablet Take 1 tablet (8.6 mg total) by mouth at bedtime as needed for mild constipation. 02/13/22   Amin, Loura Halt, MD  vitamin B-12  (CYANOCOBALAMIN) 500 MCG tablet Take 500 mcg by mouth daily.    [provider]    Physical Exam: Vitals:   04/04/23 0940 04/04/23 1000 04/04/23 1130  BP: (!) 154/80 (!) 140/77 136/72  Pulse: 91 90 90  Resp: 16 (!) 23 (!) 23  Temp: 98.2 F (36.8 C)    TempSrc: Oral    SpO2: 96% 94% 96%  Weight: 90.7 kg    Height: 5\' 11"  (1.803 m)      Constitutional: Elderly male currently in NAD, calm, comfortable Eyes: PERRL, lids and conjunctivae normal ENMT: Mucous membranes are moist.  Hard of hearing. Neck: normal, supple Respiratory: clear to auscultation bilaterally, no wheezing, no crackles. Normal respiratory effort. No accessory muscle use.  Cardiovascular:  Regular rate and rhythm, no murmurs / rubs / gallops. No extremity edema. 1+ pedal pulses. No carotid bruits.  Abdomen: no tenderness, no masses palpated. No hepatosplenomegaly. Bowel sounds positive.  Musculoskeletal: no clubbing / cyanosis.  Right leg externally rotated mildly shortened.  Patient noted significant pain with any kind of movement. Skin: no rashes, lesions, ulcers. No induration Neurologic: CN 2-12 grossly intact.   Able to move all extremities. Psychiatric: Normal judgment and insight. Alert and oriented x 3. Normal mood.   Data Reviewed:  EKG reveals sinus rhythm at 92 bpm.  Reviewed labs, imaging, pertinent records as noted in this document.  Assessment and Plan: Right femoral neck fracture secondary to fall Acute.  Patient presents recurrent fall at home.  X-rays reveal mildly displaced right femoral neck fracture.  Orthopedics consulted. -Admit to a surgical telemetry bed -Hip fracture order set utilized -Hydrocodone/morphine IV as needed for pain. -Consult anesthesia for possible nerve block -Orthopedics consulted, we will follow-up for any further recommendations  CAD Patient with prior history of three-vessel CABG in 2017.  EKG revealed normal sinus rhythm. -Continue aspirin, beta-blocker, and  Crestor  Essential hypertension On admission blood pressures elevated up to 154/80.  Patient had only been taking metoprolol tartrate 25 mg nightly instead of twice daily as prescribed.  History of gout -Continue allopurinol  Dyslipidemia -Continue Crestor  AAA Patient noted to have ectasia bordering on mild aneurysmal dilation of the ascending thoracic aorta with a maximum diameter 3.9 cm last checked in 06/2021. -Recommend repeat imaging in outpatient setting  DVT prophylaxis: Lovenox Advance Care Planning:   Code Status: Full Code   Consults: Orthopedics Family Communication: Wife updated at bedside.  Severity of Illness: The appropriate patient status for this patient is INPATIENT. Inpatient status is judged to be reasonable and necessary in order to provide the required intensity of service to ensure the patient's safety. [name] presenting symptoms, physical exam findings, and initial radiographic and laboratory data in the context of their chronic comorbidities is felt to place them at high risk for further clinical deterioration. Furthermore, it is not anticipated that the patient will be medically stable for discharge from the hospital within 2 midnights of admission.   * I certify that at the point of admission it is my clinical judgment that the patient will require inpatient hospital care spanning beyond 2 midnights from the point of admission due to high intensity of service, high risk for further deterioration and high frequency of surveillance required.*  Author: Clydie Braun, MD 04/04/2023 11:48 AM  For on call review [email or web address].

## 2023-04-05 ENCOUNTER — Encounter (HOSPITAL_COMMUNITY): Payer: Self-pay | Admitting: Internal Medicine

## 2023-04-05 ENCOUNTER — Inpatient Hospital Stay (HOSPITAL_COMMUNITY): Payer: Medicare HMO

## 2023-04-05 ENCOUNTER — Inpatient Hospital Stay (HOSPITAL_COMMUNITY): Payer: Medicare HMO | Admitting: Certified Registered Nurse Anesthetist

## 2023-04-05 ENCOUNTER — Other Ambulatory Visit: Payer: Self-pay

## 2023-04-05 ENCOUNTER — Encounter (HOSPITAL_COMMUNITY)
Admission: EM | Disposition: A | Discharge status: Skilled Nursing Facility | Payer: Self-pay | Source: Home / Self Care | Attending: Internal Medicine

## 2023-04-05 DIAGNOSIS — I25118 Atherosclerotic heart disease of native coronary artery with other forms of angina pectoris: Secondary | ICD-10-CM

## 2023-04-05 DIAGNOSIS — S72031A Displaced midcervical fracture of right femur, initial encounter for closed fracture: Secondary | ICD-10-CM

## 2023-04-05 DIAGNOSIS — I10 Essential (primary) hypertension: Secondary | ICD-10-CM

## 2023-04-05 DIAGNOSIS — Z87891 Personal history of nicotine dependence: Secondary | ICD-10-CM

## 2023-04-05 DIAGNOSIS — S72041A Displaced fracture of base of neck of right femur, initial encounter for closed fracture: Secondary | ICD-10-CM | POA: Diagnosis not present

## 2023-04-05 DIAGNOSIS — W19XXXA Unspecified fall, initial encounter: Secondary | ICD-10-CM | POA: Diagnosis not present

## 2023-04-05 DIAGNOSIS — S72001A Fracture of unspecified part of neck of right femur, initial encounter for closed fracture: Secondary | ICD-10-CM | POA: Diagnosis not present

## 2023-04-05 HISTORY — PX: TOTAL HIP ARTHROPLASTY: SHX124

## 2023-04-05 LAB — BASIC METABOLIC PANEL
Anion gap: 9 (ref 5–15)
BUN: 15 mg/dL (ref 8–23)
CO2: 21 mmol/L — ABNORMAL LOW (ref 22–32)
Calcium: 8.8 mg/dL — ABNORMAL LOW (ref 8.9–10.3)
Chloride: 103 mmol/L (ref 98–111)
Creatinine, Ser: 1.25 mg/dL — ABNORMAL HIGH (ref 0.61–1.24)
GFR, Estimated: 57 mL/min — ABNORMAL LOW (ref >60)
Glucose, Bld: 161 mg/dL — ABNORMAL HIGH (ref 70–99)
Potassium: 4.6 mmol/L (ref 3.5–5.1)
Sodium: 133 mmol/L — ABNORMAL LOW (ref 135–145)

## 2023-04-05 LAB — CBC
HCT: 44.5 % (ref 39.0–52.0)
Hemoglobin: 15.1 g/dL (ref 13.0–17.0)
MCH: 29.3 pg (ref 26.0–34.0)
MCHC: 33.9 g/dL (ref 30.0–36.0)
MCV: 86.4 fL (ref 80.0–100.0)
Platelets: 221 10*3/uL (ref 150–400)
RBC: 5.15 MIL/uL (ref 4.22–5.81)
RDW: 13.8 % (ref 11.5–15.5)
WBC: 12.2 10*3/uL — ABNORMAL HIGH (ref 4.0–10.5)
nRBC: 0 % (ref 0.0–0.2)

## 2023-04-05 LAB — GLUCOSE, CAPILLARY
Glucose-Capillary: 131 mg/dL — ABNORMAL HIGH (ref 70–99)
Glucose-Capillary: 123 mg/dL — ABNORMAL HIGH (ref 70–99)
Glucose-Capillary: 126 mg/dL — ABNORMAL HIGH (ref 70–99)
Glucose-Capillary: 119 mg/dL — ABNORMAL HIGH (ref 70–99)

## 2023-04-05 SURGERY — ARTHROPLASTY, HIP, TOTAL, ANTERIOR APPROACH
Anesthesia: General | Site: Hip | Laterality: Right

## 2023-04-05 MED ORDER — FENTANYL CITRATE (PF) 250 MCG/5ML IJ SOLN
INTRAMUSCULAR | Status: DC | PRN
  Administered 2023-04-05: 50 ug via INTRAVENOUS
  Administered 2023-04-05 (×2): 25 ug via INTRAVENOUS

## 2023-04-05 MED ORDER — TRANEXAMIC ACID-NACL 1000-0.7 MG/100ML-% IV SOLN
INTRAVENOUS | Status: AC
  Filled 2023-04-05: qty 100

## 2023-04-05 MED ORDER — MENTHOL 3 MG MT LOZG: 1.0000 | LOZENGE | OROMUCOSAL | Status: DC | PRN

## 2023-04-05 MED ORDER — SODIUM CHLORIDE 0.9 % IR SOLN
Status: DC | PRN
  Administered 2023-04-05: 1000 mL

## 2023-04-05 MED ORDER — LACTATED RINGERS IV SOLN: INTRAVENOUS | Status: DC

## 2023-04-05 MED ORDER — INSULIN ASPART 100 UNIT/ML IJ SOLN: 0.0000 [IU] | Freq: Every day | INTRAMUSCULAR | Status: DC

## 2023-04-05 MED ORDER — AMISULPRIDE (ANTIEMETIC) 5 MG/2ML IV SOLN: 10.0000 mg | Freq: Once | INTRAVENOUS | Status: DC | PRN

## 2023-04-05 MED ORDER — ASPIRIN 81 MG PO CHEW
81.0000 mg | CHEWABLE_TABLET | Freq: Two times a day (BID) | ORAL | Status: DC
  Administered 2023-04-05 – 2023-04-10 (×10): 81 mg via ORAL
  Filled 2023-04-05 (×10): qty 1

## 2023-04-05 MED ORDER — INSULIN ASPART 100 UNIT/ML IJ SOLN
0.0000 [IU] | Freq: Three times a day (TID) | INTRAMUSCULAR | Status: DC
  Administered 2023-04-07: 2 [IU] via SUBCUTANEOUS
  Administered 2023-04-08: 3 [IU] via SUBCUTANEOUS
  Administered 2023-04-08 – 2023-04-09 (×3): 2 [IU] via SUBCUTANEOUS
  Administered 2023-04-09: 3 [IU] via SUBCUTANEOUS

## 2023-04-05 MED ORDER — 0.9 % SODIUM CHLORIDE (POUR BTL) OPTIME
TOPICAL | Status: DC | PRN
  Administered 2023-04-05: 1000 mL

## 2023-04-05 MED ORDER — ROCURONIUM BROMIDE 10 MG/ML (PF) SYRINGE
PREFILLED_SYRINGE | INTRAVENOUS | Status: AC
  Filled 2023-04-05: qty 10

## 2023-04-05 MED ORDER — CEFAZOLIN SODIUM-DEXTROSE 2-4 GM/100ML-% IV SOLN: 2.0000 g | INTRAVENOUS | Status: DC

## 2023-04-05 MED ORDER — DOCUSATE SODIUM 100 MG PO CAPS
100.0000 mg | ORAL_CAPSULE | Freq: Two times a day (BID) | ORAL | Status: DC
  Administered 2023-04-05 – 2023-04-10 (×10): 100 mg via ORAL
  Filled 2023-04-05 (×10): qty 1

## 2023-04-05 MED ORDER — CEFAZOLIN SODIUM-DEXTROSE 2-4 GM/100ML-% IV SOLN
INTRAVENOUS | Status: AC
  Filled 2023-04-05: qty 100

## 2023-04-05 MED ORDER — TRANEXAMIC ACID-NACL 1000-0.7 MG/100ML-% IV SOLN: 1000.0000 mg | INTRAVENOUS | Status: DC

## 2023-04-05 MED ORDER — PHENOL 1.4 % MT LIQD: 1.0000 | OROMUCOSAL | Status: DC | PRN

## 2023-04-05 MED ORDER — ACETAMINOPHEN 500 MG PO TABS: 1000.0000 mg | ORAL_TABLET | Freq: Once | ORAL | Status: AC

## 2023-04-05 MED ORDER — ONDANSETRON HCL 4 MG/2ML IJ SOLN
INTRAMUSCULAR | Status: DC | PRN
  Administered 2023-04-05: 4 mg via INTRAVENOUS

## 2023-04-05 MED ORDER — PHENYLEPHRINE 80 MCG/ML (10ML) SYRINGE FOR IV PUSH (FOR BLOOD PRESSURE SUPPORT)
PREFILLED_SYRINGE | INTRAVENOUS | Status: AC
  Filled 2023-04-05: qty 10

## 2023-04-05 MED ORDER — LIDOCAINE 2% (20 MG/ML) 5 ML SYRINGE
INTRAMUSCULAR | Status: AC
  Filled 2023-04-05: qty 5

## 2023-04-05 MED ORDER — TRANEXAMIC ACID-NACL 1000-0.7 MG/100ML-% IV SOLN
INTRAVENOUS | Status: DC | PRN
  Administered 2023-04-05: 1000 mg via INTRAVENOUS

## 2023-04-05 MED ORDER — CHLORHEXIDINE GLUCONATE 0.12 % MT SOLN
15.0000 mL | Freq: Once | OROMUCOSAL | Status: AC
  Administered 2023-04-05: 15 mL via OROMUCOSAL

## 2023-04-05 MED ORDER — CEFAZOLIN SODIUM-DEXTROSE 2-3 GM-%(50ML) IV SOLR
INTRAVENOUS | Status: DC | PRN
  Administered 2023-04-05: 2 g via INTRAVENOUS

## 2023-04-05 MED ORDER — SUGAMMADEX SODIUM 200 MG/2ML IV SOLN
INTRAVENOUS | Status: DC | PRN
  Administered 2023-04-05: 200 mg via INTRAVENOUS

## 2023-04-05 MED ORDER — ROCURONIUM BROMIDE 10 MG/ML (PF) SYRINGE
PREFILLED_SYRINGE | INTRAVENOUS | Status: DC | PRN
  Administered 2023-04-05: 50 mg via INTRAVENOUS

## 2023-04-05 MED ORDER — FENTANYL CITRATE (PF) 250 MCG/5ML IJ SOLN
INTRAMUSCULAR | Status: AC
  Filled 2023-04-05: qty 5

## 2023-04-05 MED ORDER — LIDOCAINE 2% (20 MG/ML) 5 ML SYRINGE
INTRAMUSCULAR | Status: DC | PRN
  Administered 2023-04-05: 40 mg via INTRAVENOUS

## 2023-04-05 MED ORDER — SODIUM CHLORIDE 0.9 % IV SOLN: INTRAVENOUS | Status: DC

## 2023-04-05 MED ORDER — DIPHENHYDRAMINE HCL 12.5 MG/5ML PO ELIX: 12.5000 mg | ORAL_SOLUTION | ORAL | Status: DC | PRN

## 2023-04-05 MED ORDER — PHENYLEPHRINE HCL-NACL 20-0.9 MG/250ML-% IV SOLN
INTRAVENOUS | Status: DC | PRN
  Administered 2023-04-05 (×3): 80 ug via INTRAVENOUS
  Administered 2023-04-05: 20 ug/min via INTRAVENOUS

## 2023-04-05 MED ORDER — INSULIN ASPART 100 UNIT/ML IJ SOLN: 0.0000 [IU] | INTRAMUSCULAR | Status: DC

## 2023-04-05 MED ORDER — PROPOFOL 10 MG/ML IV BOLUS
INTRAVENOUS | Status: DC | PRN
  Administered 2023-04-05 (×2): 50 mg via INTRAVENOUS
  Administered 2023-04-05: 20 mg via INTRAVENOUS
  Administered 2023-04-05: 30 mg via INTRAVENOUS

## 2023-04-05 MED ORDER — POVIDONE-IODINE 10 % EX SWAB: 2.0000 | Freq: Once | CUTANEOUS | Status: DC

## 2023-04-05 MED ORDER — PROPOFOL 500 MG/50ML IV EMUL
INTRAVENOUS | Status: DC | PRN
  Administered 2023-04-05: 75 ug/kg/min via INTRAVENOUS

## 2023-04-05 MED ORDER — ACETAMINOPHEN 500 MG PO TABS
ORAL_TABLET | ORAL | Status: AC
  Administered 2023-04-05: 1000 mg via ORAL
  Filled 2023-04-05: qty 2

## 2023-04-05 MED ORDER — ORAL CARE MOUTH RINSE: 15.0000 mL | Freq: Once | OROMUCOSAL | Status: AC

## 2023-04-05 MED ORDER — CHLORHEXIDINE GLUCONATE 4 % EX SOLN: 60.0000 mL | Freq: Once | CUTANEOUS | Status: DC

## 2023-04-05 MED ORDER — PROPOFOL 10 MG/ML IV BOLUS
INTRAVENOUS | Status: AC
  Filled 2023-04-05: qty 20

## 2023-04-05 MED ORDER — FENTANYL CITRATE (PF) 100 MCG/2ML IJ SOLN: 25.0000 ug | INTRAMUSCULAR | Status: DC | PRN

## 2023-04-05 MED ORDER — ONDANSETRON HCL 4 MG/2ML IJ SOLN
INTRAMUSCULAR | Status: AC
  Filled 2023-04-05: qty 2

## 2023-04-05 SURGICAL SUPPLY — 55 items
APL SKNCLS STERI-STRIP NONHPOA (GAUZE/BANDAGES/DRESSINGS)
ARTICULEZE HEAD (Hips) ×1 IMPLANT
BAG COUNTER SPONGE SURGICOUNT (BAG) ×1 IMPLANT
BAG SPNG CNTER NS LX DISP (BAG) ×1
BENZOIN TINCTURE PRP APPL 2/3 (GAUZE/BANDAGES/DRESSINGS) ×1 IMPLANT
BLADE CLIPPER SURG (BLADE) IMPLANT
BLADE SAW SGTL 18X1.27X75 (BLADE) ×1 IMPLANT
COVER SURGICAL LIGHT HANDLE (MISCELLANEOUS) ×1 IMPLANT
CUP SECTOR GRIPTON 58MM (Orthopedic Implant) IMPLANT
DRAPE C-ARM 42X72 X-RAY (DRAPES) ×1 IMPLANT
DRAPE STERI IOBAN 125X83 (DRAPES) ×1 IMPLANT
DRAPE U-SHAPE 47X51 STRL (DRAPES) ×3 IMPLANT
DRSG AQUACEL AG ADV 3.5X10 (GAUZE/BANDAGES/DRESSINGS) ×1 IMPLANT
DURAPREP 26ML APPLICATOR (WOUND CARE) ×1 IMPLANT
ELECT BLADE 4.0 EZ CLEAN MEGAD (MISCELLANEOUS) ×1
ELECT BLADE 6.5 EXT (BLADE) IMPLANT
ELECT REM PT RETURN 9FT ADLT (ELECTROSURGICAL) ×1
ELECTRODE BLDE 4.0 EZ CLN MEGD (MISCELLANEOUS) ×1 IMPLANT
ELECTRODE REM PT RTRN 9FT ADLT (ELECTROSURGICAL) ×1 IMPLANT
FACESHIELD WRAPAROUND (MASK) ×2 IMPLANT
FACESHIELD WRAPAROUND OR TEAM (MASK) ×2 IMPLANT
GLOVE BIOGEL PI IND STRL 8 (GLOVE) ×2 IMPLANT
GLOVE ECLIPSE 8.0 STRL XLNG CF (GLOVE) ×1 IMPLANT
GLOVE ORTHO TXT STRL SZ7.5 (GLOVE) ×2 IMPLANT
GOWN STRL REUS W/ TWL LRG LVL3 (GOWN DISPOSABLE) ×2 IMPLANT
GOWN STRL REUS W/ TWL XL LVL3 (GOWN DISPOSABLE) ×2 IMPLANT
GOWN STRL REUS W/TWL LRG LVL3 (GOWN DISPOSABLE) ×3
GOWN STRL REUS W/TWL XL LVL3 (GOWN DISPOSABLE) ×1
HANDPIECE INTERPULSE COAX TIP (DISPOSABLE) ×1
HEAD ARTICULEZE (Hips) IMPLANT
KIT BASIN OR (CUSTOM PROCEDURE TRAY) ×1 IMPLANT
KIT TURNOVER KIT B (KITS) ×1 IMPLANT
LINER NEUTRAL 36X58 PLUS4 IMPLANT
MANIFOLD NEPTUNE II (INSTRUMENTS) ×1 IMPLANT
NS IRRIG 1000ML POUR BTL (IV SOLUTION) ×1 IMPLANT
PACK TOTAL JOINT (CUSTOM PROCEDURE TRAY) ×1 IMPLANT
PAD ARMBOARD 7.5X6 YLW CONV (MISCELLANEOUS) ×1 IMPLANT
SET HNDPC FAN SPRY TIP SCT (DISPOSABLE) ×1 IMPLANT
STAPLER VISISTAT 35W (STAPLE) IMPLANT
STEM FEM ACTIS HIGH SZ7 (Stem) IMPLANT
STRIP CLOSURE SKIN 1/2X4 (GAUZE/BANDAGES/DRESSINGS) ×2 IMPLANT
SUT ETHIBOND NAB CT1 #1 30IN (SUTURE) ×1 IMPLANT
SUT MNCRL AB 4-0 PS2 18 (SUTURE) IMPLANT
SUT VIC AB 0 CT1 27 (SUTURE) ×1
SUT VIC AB 0 CT1 27XBRD ANBCTR (SUTURE) ×1 IMPLANT
SUT VIC AB 1 CT1 27 (SUTURE) ×1
SUT VIC AB 1 CT1 27XBRD ANBCTR (SUTURE) ×1 IMPLANT
SUT VIC AB 2-0 CT1 27 (SUTURE) ×2
SUT VIC AB 2-0 CT1 TAPERPNT 27 (SUTURE) ×1 IMPLANT
TOWEL GREEN STERILE (TOWEL DISPOSABLE) ×1 IMPLANT
TOWEL GREEN STERILE FF (TOWEL DISPOSABLE) ×1 IMPLANT
TRAY CATH INTERMITTENT SS 16FR (CATHETERS) IMPLANT
TRAY FOLEY W/BAG SLVR 16FR (SET/KITS/TRAYS/PACK)
TRAY FOLEY W/BAG SLVR 16FR ST (SET/KITS/TRAYS/PACK) IMPLANT
WATER STERILE IRR 1000ML POUR (IV SOLUTION) ×2 IMPLANT

## 2023-04-05 NOTE — Anesthesia Preprocedure Evaluation (Addendum)
Anesthesia Evaluation  Patient identified by MRN, date of birth, ID band Patient awake    Reviewed: Allergy & Precautions, NPO status , Patient's Chart, lab work & pertinent test results  Airway Mallampati: II  TM Distance: >3 FB Neck ROM: Full    Dental  (+) Dental Advisory Given   Pulmonary former smoker   breath sounds clear to auscultation       Cardiovascular hypertension, Pt. on medications and Pt. on home beta blockers + CAD, + CABG and + Peripheral Vascular Disease   Rhythm:Regular Rate:Normal     Neuro/Psych CVA    GI/Hepatic Neg liver ROS, hiatal hernia,GERD  ,,  Endo/Other  diabetes, Type 2    Renal/GU negative Renal ROS     Musculoskeletal  (+) Arthritis ,    Abdominal   Peds  Hematology negative hematology ROS (+)   Anesthesia Other Findings   Reproductive/Obstetrics                             Lab Results  Component Value Date   WBC 12.2 (H) 04/05/2023   HGB 15.1 04/05/2023   HCT 44.5 04/05/2023   MCV 86.4 04/05/2023   PLT 221 04/05/2023   Lab Results  Component Value Date   CREATININE 1.25 (H) 04/05/2023   BUN 15 04/05/2023   NA 133 (L) 04/05/2023   K 4.6 04/05/2023   CL 103 04/05/2023   CO2 21 (L) 04/05/2023    Anesthesia Physical Anesthesia Plan  ASA: 3  Anesthesia Plan: General   Post-op Pain Management: Tylenol PO (pre-op)*   Induction: Intravenous  PONV Risk Score and Plan: 2 and Dexamethasone, Ondansetron and Treatment may vary due to age or medical condition  Airway Management Planned: Oral ETT  Additional Equipment:   Intra-op Plan:   Post-operative Plan: Extubation in OR  Informed Consent: I have reviewed the patients History and Physical, chart, labs and discussed the procedure including the risks, benefits and alternatives for the proposed anesthesia with the patient or authorized representative who has indicated his/her understanding  and acceptance.     Dental advisory given  Plan Discussed with: CRNA  Anesthesia Plan Comments:         Anesthesia Quick Evaluation

## 2023-04-05 NOTE — Anesthesia Procedure Notes (Signed)
Procedure Name: Intubation Date/Time: 04/05/2023 11:06 AM  Performed by: Cy Blamer, CRNAPre-anesthesia Checklist: Patient identified, Emergency Drugs available, Suction available and Patient being monitored Patient Re-evaluated:Patient Re-evaluated prior to induction Oxygen Delivery Method: Circle system utilized Preoxygenation: Pre-oxygenation with 100% oxygen Induction Type: IV induction Ventilation: Oral airway inserted - appropriate to patient size and Two handed mask ventilation required Laryngoscope Size: Miller and 3 Grade View: Grade I Tube type: Oral Tube size: 7.5 mm Number of attempts: 1 Airway Equipment and Method: Stylet and Oral airway Placement Confirmation: ETT inserted through vocal cords under direct vision, positive ETCO2 and breath sounds checked- equal and bilateral Secured at: 23 cm Tube secured with: Tape Dental Injury: Teeth and Oropharynx as per pre-operative assessment  Comments: Intubation performed by C. Madilyn Fireman, [place]

## 2023-04-05 NOTE — H&P (Signed)
The patient is an 82 year old gentleman who presents for a right direct anterior hip replacement to treat an acute right hip femoral neck fracture that is significantly displaced.  I have spoken with the patient in length about this but also spoke in length to his wife who provides consent given he has slight dementia.  I did review the patient's chart again this morning and saw him as a consultation yesterday evening.  His vital signs and labs are stable this morning.  The risks and benefits of surgery been explained in detail and informed consent has been obtained.  The right operative hip has been marked.  Please refer to the H&P and consult note from yesterday as well.

## 2023-04-05 NOTE — Transfer of Care (Signed)
Immediate Anesthesia Transfer of Care Note  Patient: Trevor Ward  Procedure(s) Performed: RIGHT TOTAL HIP ARTHROPLASTY ANTERIOR APPROACH (Right: Hip)  Patient Location: PACU  Anesthesia Type:General  Level of Consciousness: drowsy, patient cooperative, and responds to stimulation  Airway & Oxygen Therapy: Patient Spontanous Breathing and Patient connected to face mask oxygen  Post-op Assessment: Report given to RN, Post -op Vital signs reviewed and stable, Patient moving all extremities X 4, and Patient able to stick tongue midline  Post vital signs: Reviewed  Last Vitals:  Vitals Value Taken Time  BP 90/54 04/05/23 1233  Temp 97.0   Pulse 67 04/05/23 1235  Resp 16 04/05/23 1235  SpO2 98 % 04/05/23 1235  Vitals shown include unfiled device data.  Last Pain:  Vitals:   04/05/23 1020  TempSrc: Oral  PainSc:          Complications: No notable events documented.

## 2023-04-05 NOTE — Hospital Course (Addendum)
82 y.o. male with medical history significant of hypertension, CAD s/p CABG, PAD, AAA, gait disturbance, and mild dementia who presents after having a fall at home.  Patient lives at home with his wife and normally gets around without use of any assistive devices although wife makes note that he should.  He had been wearing socks on a slick floor when he had been down to do something with the dogs and fell.  Landed on his right hip and reported having immediate pain.  He was unable to bear weight or walk due to the pain.  Denied any loss of consciousness or trauma to his head.  He is wife makes note that he previously fractured with a left hip in 01/2022.    X-rays of the pelvis noted a acute mildly displaced right femoral neck fracture.  Orthopedics had been consulted.

## 2023-04-05 NOTE — Op Note (Signed)
Operative Note  Date of operation: 04/05/2023 Preoperative diagnosis: Right displaced transcervical femoral neck fracture Postoperative diagnosis: Same  Procedure: Right direct anterior total hip arthroplasty  Implants: Implant Name Type Inv. Item Serial No. Manufacturer Lot No. LRB No. Used Action  CUP SECTOR GRIPTON - [id number] Orthopedic Implant CUP SECTOR GRIPTON  DEPUY ORTHOPAEDICS [id number] Right 1 Implanted  LINER NEUTRAL 36X58 PLUS4 - [id number]  LINER NEUTRAL 36X58 PLUS4  DEPUY ORTHOPAEDICS [id number] Right 1 Implanted  STEM FEM ACTIS HIGH SZ7 - [id number] Stem STEM FEM ACTIS HIGH SZ7  DEPUY ORTHOPAEDICS [id number] Right 1 Implanted  ARTICULEZE HEAD - [id number] Hips ARTICULEZE HEAD  DEPUY ORTHOPAEDICS [id number] Right 1 Implanted   Surgeon: Vanita Panda. Magnus Ivan, MD Assistant: Darron Doom, RNFA  Anesthesia: General EBL: 300 to 350 cc Antibiotics: 2 g IV Ancef Complications: None  Indications: The patient is an 82 year old gentleman who sustained a mechanical fall the night before last injuring his right hip.  He was brought to the [facility] emergency room the next day and found to have a displaced femoral neck fracture of the right hip.  He does have a previous history of a left total hip arthroplasty secondary to arthritis.  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  We recommended a total hip arthroplasty at this standpoint given the displaced nature of his fracture.  The risks and benefits of surgery were explained in detail including the risk of acute blood loss anemia, nerve vessel injury, fracture, infection, DVT, dislocation, implant failure, leg length differences and wound healing issues.  He understands her goals are hopefully decrease pain, improve mobility and overall improve quality of life.  Procedure description: After informed consent was obtained and the appropriate right hip was marked, the  patient was brought to the operating room where general anesthesia was obtained while he was on the stretcher.  Traction boots were placed on both his feet and he was placed supine on the Hana fracture table with a perineal post in place in both legs and inline skeletal traction devices no traction applied.  His right operative hip was assessed radiographically as well as his pelvis.  His right hip was prepped and draped with DuraPrep and sterile drapes.  A timeout was called and he was identified as the correct patient and the correct right hip.  An incision was then made just inferior and posterior the ASIS and carried slightly obliquely down the leg.  Dissection was carried down to the tensor fascia lata muscle and the tensor fascia was divided longitudinally to proceed with a direct anterior approach to the hip.  Circumflex vessels were identified and cauterized.  The hip capsule was identified and opened up in L-type format finding a hemarthrosis consistent with a displaced femoral neck fracture which we can see.  Cobra retractors were placed around the medial and lateral femoral neck just distal to the fracture and an oscillating saw was used to make a femoral neck cut there was a freshening cut just distal to the fracture but proximal to the lesser trochanter.  This was completed with an osteotome.  The remnants of the femoral neck and the femoral head was removed in its entirety.  A bent Hohmann was then placed over the medial acetabular rim and remnants of the acetabular labrum and other debris removed.  Reaming was then initiated using a size 43 reamer and we went and stepwise increments up to a size  57 reamer with all reamers placed under direct visualization and the last replaced on direct fluoroscopy in order to obtain the depth of reaming, the inclination and the anteversion.  The real DePuy sector GRIPTION acetabular component size 58 was then placed without difficulty followed by a 36+4 polyethylene  liner.  Attention was then turned to the femur.  With the right leg externally rotated to 120 degrees, extended and adducted, a Mueller retractor was placed medially and a Hohmann retractor was placed behind the greater trochanter.  The lateral joint capsule was released and a box cutting osteotome was used to enter the femoral canal.  Broaching was then initiated using the broaching system from [employer] from a size 0 going up to a size 7.  We then trialed a standard offset femoral neck and a 36+1.5 trial hip ball.  The leg was brought over and up and with traction and internal rotation reduced in the pelvis.  Based on radiographic and clinical assessment we needed more offset and leg length.  We dislocated the hip and remove the trial components.  We placed the real Actis femoral component size 7 but with high offset.  We then placed a 36+5 metal head ball.  Again this reduced in the acetabulum we are pleased with leg length, offset, range of motion and stability assessed radiographically and clinically.  Soft tissue was then irrigated with normal saline solution.  Joint capsule was closed with interrupted #1 Ethibond suture followed by #1 Vicryl to close the tensor fascia.  0 Vicryl was used to close the deep tissue and 2-0 Vicryl was used to close subcutaneous tissue.  Skin was closed staples.  An Aquacel dressing was applied.  Patient was taken off on table, awakened, extubated and taken recovery room in stable condition.

## 2023-04-05 NOTE — TOC CM/SW Note (Signed)
Transition of Care [facility]) - Inpatient Brief Assessment   Patient Details  Name: Trevor Ward MRN: [id number] Date of Birth: August 21, 1941  Transition of Care [facility]) CM/SW Contact:    Epifanio Lesches, RN Phone Number: 04/05/2023, 3:46 PM   Clinical Narrative:    - S/p Right direct anterior total hip arthroplasty  Pt without DME needs. Already owns  RW, BSC, cane. Home health PT services prearranged with [facility].  Wife to provide transportation to home once d/c ready.   PT evaluation pending .Marland Kitchen...  TOC team  following and will assist with needs.  Transition of Care Asessment: Insurance and Status: Insurance coverage has been reviewed Patient has primary care physician: No Home environment has been reviewed: From home with wife Prior level of function:: PTA independent with ADL's Prior/Current Home Services: No current home services Social Determinants of Health Reivew: SDOH reviewed no interventions necessary Readmission risk has been reviewed: No Transition of care needs: transition of care needs identified, TOC will continue to follow

## 2023-04-05 NOTE — TOC CAGE-AID Note (Signed)
Transition of Care [facility]) - CAGE-AID Screening   Patient Details  Name: EUGEN JEANSONNE MRN: [id number] Date of Birth: 01-11-1941  Transition of Care [facility]) CM/SW Contact:    Janora Norlander, RN Phone Number: [phone] 04/05/2023, 5:21 PM   Clinical Narrative: Pt here after falling onto his right hip and sustaining a right femoral neck fracture.  Pt denies alcohol or drug use.  No resources needed.  Screening complete.   CAGE-AID Screening:    Have You Ever Felt You Ought to Cut Down on Your Drinking or Drug Use?: No Have People Annoyed You By Critizing Your Drinking Or Drug Use?: No Have You Felt Bad Or Guilty About Your Drinking Or Drug Use?: No Have You Ever Had a Drink or Used Drugs First Thing In The Morning to Steady Your Nerves or to Get Rid of a Hangover?: No CAGE-AID Score: 0  Substance Abuse Education Offered: No

## 2023-04-05 NOTE — Progress Notes (Signed)
  Progress Note   Patient: Trevor Ward MWN:[id number] DOB: June 26, 1941 DOA: 04/04/2023     1 DOS: the patient was seen and examined on 04/05/2023   Brief hospital course: 82 y.o. male with medical history significant of hypertension, CAD s/p CABG, PAD, AAA, gait disturbance, and mild dementia who presents after having a fall at home.  Patient lives at home with his wife and normally gets around without use of any assistive devices although wife makes note that he should.  He had been wearing socks on a slick floor when he had been down to do something with the dogs and fell.  Landed on his right hip and reported having immediate pain.  He was unable to bear weight or walk due to the pain.  Denied any loss of consciousness or trauma to his head.  He is wife makes note that he previously fractured with a left hip in 01/2022.    X-rays of the pelvis noted a acute mildly displaced right femoral neck fracture.  Orthopedics had been consulted.   Assessment and Plan: Right femoral neck fracture secondary to fall Patient presents recurrent fall at home.   X-rays reveal mildly displaced right femoral neck fracture.   -Orthopedic Surgery following. Pt is now s/p R anterior total hip arthoplasty 7/11 -cont analgesia as needed -F/u with PT recs   CAD Patient with prior history of three-vessel CABG in 2017.  EKG revealed normal sinus rhythm. -Continue aspirin, beta-blocker, and Crestor   Essential hypertension Patient had only been taking metoprolol tartrate 25 mg nightly instead of twice daily as prescribed. BP noted to be stable and controlled post-op   History of gout -Continue allopurinol   Dyslipidemia -Continue Crestor   AAA Patient noted to have ectasia bordering on mild aneurysmal dilation of the ascending thoracic aorta with a maximum diameter 3.9 cm last checked in 06/2021. -Recommend repeat imaging in outpatient setting   Subjective: Pt seen prior to surgery. Eager to have surgery  performed  Physical Exam: Vitals:   04/05/23 1233 04/05/23 1245 04/05/23 1300 04/05/23 1337  BP: (!) 90/54 (!) 111/57 (!) 112/59 116/66  Pulse: 66 65 65   Resp: 13 12 10    Temp: (!) 97 F (36.1 C)  (!) 97 F (36.1 C) (!) 97.5 F (36.4 C)  TempSrc:    Oral  SpO2: 98% 98% 94% 96%  Weight:      Height:       General exam: Awake, laying in bed, in nad Respiratory system: Normal respiratory effort, no wheezing Cardiovascular system: regular rate, s1, s2 Gastrointestinal system: Soft, nondistended, positive BS Central nervous system: CN2-12 grossly intact, strength intact Extremities: Perfused, no clubbing Skin: Normal skin turgor, no notable skin lesions seen Psychiatry: Mood normal // no visual hallucinations   Data Reviewed:  Labs reviewed: Na 133, K 4.6, Cr 1.25, WBC 12.2, Hgb 15.1  Family Communication: Pt in room, family at bedside  Disposition: Status is: Inpatient Remains inpatient appropriate because: Severity of illness  Planned Discharge Destination:  Pending PT eval     Author: Rickey Barbara, MD 04/05/2023 4:25 PM  For on call review [email or web address].

## 2023-04-05 NOTE — Anesthesia Postprocedure Evaluation (Signed)
Anesthesia Post Note  Patient: Trevor Ward  Procedure(s) Performed: RIGHT TOTAL HIP ARTHROPLASTY ANTERIOR APPROACH (Right: Hip)     Patient location during evaluation: PACU Anesthesia Type: General Level of consciousness: awake and alert Pain management: pain level controlled Vital Signs Assessment: post-procedure vital signs reviewed and stable Respiratory status: spontaneous breathing, nonlabored ventilation, respiratory function stable and patient connected to nasal cannula oxygen Cardiovascular status: blood pressure returned to baseline and stable Postop Assessment: no apparent nausea or vomiting Anesthetic complications: no  No notable events documented.  Last Vitals:  Vitals:   04/05/23 1300 04/05/23 1337  BP: (!) 112/59 116/66  Pulse: 65   Resp: 10   Temp: (!) 36.1 C (!) 36.4 C  SpO2: 94% 96%    Last Pain:  Vitals:   04/05/23 1337  TempSrc: Oral  PainSc:                  [name]

## 2023-04-06 ENCOUNTER — Encounter (HOSPITAL_COMMUNITY): Payer: Self-pay | Admitting: Orthopaedic Surgery

## 2023-04-06 DIAGNOSIS — W19XXXA Unspecified fall, initial encounter: Secondary | ICD-10-CM | POA: Diagnosis not present

## 2023-04-06 DIAGNOSIS — S72001A Fracture of unspecified part of neck of right femur, initial encounter for closed fracture: Secondary | ICD-10-CM | POA: Diagnosis not present

## 2023-04-06 LAB — HEMOGLOBIN A1C
Hgb A1c MFr Bld: 6.4 % — ABNORMAL HIGH (ref 4.8–5.6)
Mean Plasma Glucose: 137 mg/dL

## 2023-04-06 LAB — COMPREHENSIVE METABOLIC PANEL
ALT: 14 U/L (ref 0–44)
AST: 15 U/L (ref 15–41)
Albumin: 3.2 g/dL — ABNORMAL LOW (ref 3.5–5.0)
Alkaline Phosphatase: 50 U/L (ref 38–126)
Anion gap: 8 (ref 5–15)
BUN: 25 mg/dL — ABNORMAL HIGH (ref 8–23)
CO2: 24 mmol/L (ref 22–32)
Calcium: 8.3 mg/dL — ABNORMAL LOW (ref 8.9–10.3)
Chloride: 102 mmol/L (ref 98–111)
Creatinine, Ser: 1.41 mg/dL — ABNORMAL HIGH (ref 0.61–1.24)
GFR, Estimated: 49 mL/min — ABNORMAL LOW (ref >60)
Glucose, Bld: 128 mg/dL — ABNORMAL HIGH (ref 70–99)
Potassium: 4 mmol/L (ref 3.5–5.1)
Sodium: 134 mmol/L — ABNORMAL LOW (ref 135–145)
Total Bilirubin: 0.5 mg/dL (ref 0.3–1.2)
Total Protein: 6.4 g/dL — ABNORMAL LOW (ref 6.5–8.1)

## 2023-04-06 LAB — GLUCOSE, CAPILLARY
Glucose-Capillary: 121 mg/dL — ABNORMAL HIGH (ref 70–99)
Glucose-Capillary: 138 mg/dL — ABNORMAL HIGH (ref 70–99)
Glucose-Capillary: 85 mg/dL (ref 70–99)
Glucose-Capillary: 91 mg/dL (ref 70–99)

## 2023-04-06 LAB — CBC
HCT: 43.1 % (ref 39.0–52.0)
Hemoglobin: 14.5 g/dL (ref 13.0–17.0)
MCH: 29.6 pg (ref 26.0–34.0)
MCHC: 33.6 g/dL (ref 30.0–36.0)
MCV: 88 fL (ref 80.0–100.0)
Platelets: 186 10*3/uL (ref 150–400)
RBC: 4.9 MIL/uL (ref 4.22–5.81)
RDW: 14.1 % (ref 11.5–15.5)
WBC: 11.1 10*3/uL — ABNORMAL HIGH (ref 4.0–10.5)
nRBC: 0 % (ref 0.0–0.2)

## 2023-04-06 MED ORDER — LACTATED RINGERS IV SOLN: INTRAVENOUS | Status: DC

## 2023-04-06 MED ORDER — QUETIAPINE FUMARATE 25 MG PO TABS
25.0000 mg | ORAL_TABLET | Freq: Every day | ORAL | Status: DC
  Administered 2023-04-06 – 2023-04-09 (×4): 25 mg via ORAL
  Filled 2023-04-06 (×4): qty 1

## 2023-04-06 NOTE — Progress Notes (Signed)
  Progress Note   Patient: Trevor Ward UJW:[id number] DOB: 1941/01/18 DOA: 04/04/2023     2 DOS: the patient was seen and examined on 04/06/2023   Brief hospital course: 82 y.o. male with medical history significant of hypertension, CAD s/p CABG, PAD, AAA, gait disturbance, and mild dementia who presents after having a fall at home.  Patient lives at home with his wife and normally gets around without use of any assistive devices although wife makes note that he should.  He had been wearing socks on a slick floor when he had been down to do something with the dogs and fell.  Landed on his right hip and reported having immediate pain.  He was unable to bear weight or walk due to the pain.  Denied any loss of consciousness or trauma to his head.  He is wife makes note that he previously fractured with a left hip in 01/2022.    X-rays of the pelvis noted a acute mildly displaced right femoral neck fracture.  Orthopedics had been consulted.   Assessment and Plan: Right femoral neck fracture secondary to fall Patient presents recurrent fall at home.   X-rays reveal mildly displaced right femoral neck fracture.   -Orthopedic Surgery following. Pt is now s/p R anterior total hip arthoplasty 7/11 -cont analgesia as needed -PT recs for SNF. TOC following   CAD Patient with prior history of three-vessel CABG in 2017.  EKG revealed normal sinus rhythm. -Continue aspirin, beta-blocker, and Crestor   Essential hypertension Patient had only been taking metoprolol tartrate 25 mg nightly instead of twice daily as prescribed. BP remains stable and controlled    History of gout -Continue allopurinol   Dyslipidemia -Continue Crestor   AAA Patient noted to have ectasia bordering on mild aneurysmal dilation of the ascending thoracic aorta with a maximum diameter 3.9 cm last checked in 06/2021. -Recommend repeat imaging in outpatient setting  Dementia -More confused this AM -Pt's family reports pt  was up overnight calling pt's wife  almost every hr starting around 1am -Will start at bedtime seroquel to promote proper day/night cycle  ARF -Cr up to 1.41 -Encourage PO as tolerated -Cont on LR at 75cc/hr -Recheck bmet in AM   Subjective: Pleasantly confused this AM  Physical Exam: Vitals:   04/06/23 0014 04/06/23 0441 04/06/23 1000 04/06/23 1500  BP: (!) 141/50 108/80 115/61 127/73  Pulse: 94 83 86 96  Resp: 18 18 (!) 22 20  Temp: 98.6 F (37 C) 98.4 F (36.9 C) 98 F (36.7 C) 98 F (36.7 C)  TempSrc: Oral Oral Oral Oral  SpO2: 92% 95% 93% 96%  Weight:      Height:       General exam: Conversant, in no acute distress Respiratory system: normal chest rise, clear, no audible wheezing Cardiovascular system: regular rhythm, s1-s2 Gastrointestinal system: Nondistended, nontender, pos BS Central nervous system: No seizures, no tremors Extremities: No cyanosis, no joint deformities Skin: No rashes, no pallor Psychiatry: difficult to assess given confusion  Data Reviewed:  Labs reviewed: Na 134, K 4.0, Cr 1.41, WBC 11.1  Family Communication: Pt in room, family at bedside  Disposition: Status is: Inpatient Remains inpatient appropriate because: Severity of illness  Planned Discharge Destination: Skilled nursing facility     Author: Rickey Barbara, MD 04/06/2023 5:52 PM  For on call review [email or web address].

## 2023-04-06 NOTE — NC FL2 (Signed)
[place] MEDICAID FL2 LEVEL OF CARE FORM     IDENTIFICATION  Patient Name: Trevor Ward Birthdate: 1941/09/06 Sex: male Admission Date (Current Location): 04/04/2023  [facility] and [place] Number:  Producer, television/film/video and Address:  [facility] [place], [address] [place], [place] [address]      Provider Number: [id number]  Attending Physician Name and Address:  Jerald Kief, MD  Relative Name and Phone Number:  Shaheed, Koerber (Spouse)  [phone] (Mobile)    Current Level of Care: Hospital Recommended Level of Care: Skilled Nursing Facility Prior Approval Number:    Date Approved/Denied:   PASRR Number: [id number]  Discharge Plan: SNF    Current Diagnoses: Patient Active Problem List   Diagnosis Date Noted   Closed displaced fracture of right femoral neck (HCC) 04/04/2023   Fall at home, initial encounter 04/04/2023   AAA (abdominal aortic aneurysm) (HCC) 04/04/2023   Elevated troponin 02/09/2022   Hip fracture (HCC) 02/09/2022   Malnutrition of moderate degree 02/09/2022   Closed left hip fracture, initial encounter (HCC) 02/08/2022   Hyperkalemia 02/08/2022   Thoracic aortic aneurysm without rupture (HCC) 06/19/2019   Coronary artery disease of native artery of native heart with stable angina pectoris (HCC) 04/14/2016   Near syncope 02/28/2016   HOH (hard of hearing) 02/08/2016   S/P CABG x 4 02/02/16 02/02/2016   Exertional dyspnea 01/28/2016   Aortic atherosclerosis (HCC) 01/28/2016   Essential hypertension 07/26/2015   History of Rt CA endarterectomy 07/26/2015   Dyslipidemia    Gout 04/11/2015   History of ischemic right MCA stroke 04/11/2015    Orientation RESPIRATION BLADDER Height & Weight     Self  Normal Continent Weight: 198 lb 6.6 oz (90 kg) Height:  5\' 11"  (180.3 cm)  BEHAVIORAL SYMPTOMS/MOOD NEUROLOGICAL BOWEL NUTRITION STATUS      Continent Diet (see d/c summary)  AMBULATORY STATUS COMMUNICATION OF NEEDS Skin    Extensive Assist Verbally Surgical wounds (Incision right hip)                       Personal Care Assistance Level of Assistance  Bathing, Feeding, Dressing Bathing Assistance: Maximum assistance Feeding assistance: Independent Dressing Assistance: Maximum assistance     Functional Limitations Info  Sight, Hearing, Speech Sight Info: Adequate Hearing Info: Impaired Speech Info: Adequate    SPECIAL CARE FACTORS FREQUENCY  OT (By licensed OT), PT (By licensed PT)     PT Frequency: 5x/week OT Frequency: 5x/week            Contractures Contractures Info: Not present    Additional Factors Info  Allergies, Code Status Code Status Info: Full code Allergies Info: Ativan (lorazepam), statins, Mirtazapine           Current Medications (04/06/2023):  This is the current hospital active medication list Current Facility-Administered Medications  Medication Dose Route Frequency Provider Last Rate Last Admin   allopurinol (ZYLOPRIM) tablet 100 mg  100 mg Oral Daily [name], MD   100 mg at 04/06/23 0912   amitriptyline (ELAVIL) tablet 25 mg  25 mg Oral QHS [name], MD   25 mg at 04/05/23 2258   aspirin chewable tablet 81 mg  81 mg Oral BID [name], MD   81 mg at 04/06/23 0912   buPROPion (WELLBUTRIN XL) 24 hr tablet 300 mg  300 mg Oral Daily [name], MD   300 mg at 04/06/23 [phone]  colchicine tablet 0.6-1.2 mg  0.6-1.2 mg Oral Daily PRN [name], MD       diclofenac Sodium (VOLTAREN) 1 % topical gel 1 Application  1 Application Topical TID PRN [name], MD       diphenhydrAMINE (BENADRYL) 12.5 MG/5ML elixir 12.5-25 mg  12.5-25 mg Oral Q4H PRN [name], MD       docusate sodium (COLACE) capsule 100 mg  100 mg Oral BID [name], MD   100 mg at 04/06/23 0912   enoxaparin (LOVENOX) injection 40 mg  40 mg Subcutaneous Q24H [name], MD   40 mg at  04/05/23 2259   HYDROcodone-acetaminophen (NORCO/VICODIN) 5-325 MG per tablet 1-2 tablet  1-2 tablet Oral Q6H PRN [name], MD   2 tablet at 04/06/23 0230   insulin aspart (novoLOG) injection 0-15 Units  0-15 Units Subcutaneous TID WC Jerald Kief, MD       insulin aspart (novoLOG) injection 0-5 Units  0-5 Units Subcutaneous QHS Jerald Kief, MD       lactated ringers infusion   Intravenous Continuous Jerald Kief, MD 75 mL/hr at 04/06/23 1139 New Bag at 04/06/23 1139   meclizine (ANTIVERT) tablet 25 mg  25 mg Oral TID PRN [name], MD       menthol-cetylpyridinium (CEPACOL) lozenge 3 mg  1 lozenge Oral PRN [name], MD       Or   phenol (CHLORASEPTIC) mouth spray 1 spray  1 spray Mouth/Throat PRN [name], MD       metoprolol tartrate (LOPRESSOR) tablet 25 mg  25 mg Oral QHS [name], MD   25 mg at 04/05/23 2259   morphine (PF) 2 MG/ML injection 0.5 mg  0.5 mg Intravenous Q2H PRN [name], MD       ondansetron [facility]) injection 4 mg  4 mg Intravenous Q6H PRN [name], MD   4 mg at 04/04/23 1149   pantoprazole (PROTONIX) EC tablet 40 mg  40 mg Oral QHS [name], MD   40 mg at 04/05/23 2258   QUEtiapine (SEROQUEL) tablet 25 mg  25 mg Oral QHS Jerald Kief, MD       rosuvastatin (CRESTOR) tablet 10 mg  10 mg Oral QHS [name], MD   10 mg at 04/05/23 2258   senna-docusate (Senokot-S) tablet 1 tablet  1 tablet Oral QHS [name], MD   1 tablet at 04/05/23 2259     Discharge Medications: Please see discharge summary for a list of discharge medications.  Relevant Imaging Results:  Relevant Lab Results:   Additional Information SSN: [id number]  Erin Sons, LCSW

## 2023-04-06 NOTE — TOC Initial Note (Signed)
Transition of Care [facility]) - Initial/Assessment Note    Patient Details  Name: Trevor Ward MRN: [id number] Date of Birth: 1941-03-30  Transition of Care [facility] CM/SW Contact:    Erin Sons, LCSW Phone Number: 04/06/2023, 1:14 PM  Clinical Narrative:                  CSW called pt's spouse to discuss SNF recommendation. Spouse states that pt has been to [facility] in the past and that is where she would like pt to go for rehab. Fl2 completed and sent in hub. TOC will continue to follow to assist with coordination to SNF.   Expected Discharge Plan: Skilled Nursing Facility Barriers to Discharge: Continued Medical Work up   Patient Goals and CMS Choice            Expected Discharge Plan and Services       Living arrangements for the past 2 months: Single Family Home                                      Prior Living Arrangements/Services Living arrangements for the past 2 months: Single Family Home Lives with:: Spouse                   Activities of Daily Living Home Assistive Devices/Equipment: Built-in shower seat ADL Screening (condition at time of admission) Patient's cognitive ability adequate to safely complete daily activities?: Yes Is the patient deaf or have difficulty hearing?: Yes Does the patient have difficulty seeing, even when wearing glasses/contacts?: No Does the patient have difficulty concentrating, remembering, or making decisions?: No Patient able to express need for assistance with ADLs?: Yes Does the patient have difficulty dressing or bathing?: No Independently performs ADLs?: Yes (appropriate for developmental age) Does the patient have difficulty walking or climbing stairs?: No Weakness of Legs: Left Weakness of Arms/Hands: None  Permission Sought/Granted                  Emotional Assessment       Orientation: : Oriented to Self Alcohol / Substance Use: Not Applicable Psych Involvement: No  (comment)  Admission diagnosis:  Fall, initial encounter [W19.XXXA] Closed fracture of right hip, initial encounter (HCC) [S72.001A] Closed displaced fracture of right femoral neck (HCC) [S72.001A] Patient Active Problem List   Diagnosis Date Noted   Closed displaced fracture of right femoral neck (HCC) 04/04/2023   Fall at home, initial encounter 04/04/2023   AAA (abdominal aortic aneurysm) (HCC) 04/04/2023   Elevated troponin 02/09/2022   Hip fracture (HCC) 02/09/2022   Malnutrition of moderate degree 02/09/2022   Closed left hip fracture, initial encounter (HCC) 02/08/2022   Hyperkalemia 02/08/2022   Thoracic aortic aneurysm without rupture (HCC) 06/19/2019   Coronary artery disease of native artery of native heart with stable angina pectoris (HCC) 04/14/2016   Near syncope 02/28/2016   HOH (hard of hearing) 02/08/2016   S/P CABG x 4 02/02/16 02/02/2016   Exertional dyspnea 01/28/2016   Aortic atherosclerosis (HCC) 01/28/2016   Essential hypertension 07/26/2015   History of Rt CA endarterectomy 07/26/2015   Dyslipidemia    Gout 04/11/2015   History of ischemic right MCA stroke 04/11/2015   PCP:  Lupita Raider, MD Pharmacy:   [facility] [address] ([address]), [place] - [address] [place] ([place]) [place] [address] Phone: [phone] Fax: [phone]  [facility] [place], [place] - [id number], [address], [place] [address] Phone: [phone] Fax: [phone]  [facility] - [place], [place] - [address] [place] [address] Phone: [phone] Fax: [phone]  [facility] [address], [place] - [address] [place] [address] Phone: [phone] Fax: [phone]     Social Determinants of Health (SDOH) Social History: SDOH Screenings   Food Insecurity: No Food Insecurity (04/04/2023)  Housing: Low Risk   (04/04/2023)  Transportation Needs: No Transportation Needs (04/04/2023)  Utilities: Not At Risk (04/04/2023)  Tobacco Use: Medium Risk (04/05/2023)   SDOH Interventions:     Readmission Risk Interventions     No data to display

## 2023-04-06 NOTE — Evaluation (Signed)
Physical Therapy Evaluation Patient Details Name: Trevor Ward MRN: [id number] DOB: [birth year] Today's Date: 04/06/2023  History of Present Illness  82 year old gentleman presents to ED 7/9 s/p fall at home. Found to have acute significantly displaced right hip femoral neck fracture s/p 7/11 right direct anterior hip replacement. [id number], CAD s/p CABG, PAD, AAA, gait disturbance, and mild dementia  Clinical Impression  Pt oriented only to self, wife in room providing PLOF and home set up. PTA pt lives with wife in single story home with single step to enter. Pt's wife works and pt at home for long periods during the day without supervision. Wife reports he has RW however does not use it and has frequent falls. She reports he is generally independent in ADLs and she takes care of his iADLs. Pt has no reports of pain in supine, however with movement has facial pain score of 8/10 in his R hip. Pt requires total A to come to EOB, is never able to achieve seated balance to retropulsion. Pt requires total A for returning LE back into bed. Patient will benefit from continued inpatient follow up therapy, <3 hours/day Pt wife reports he was at [facility] after his last hip replacement and that it is close to her home. PT will continue to follow acutely.        Assistance Recommended at Discharge Frequent or constant Supervision/Assistance  If plan is discharge home, recommend the following:  Can travel by private vehicle  Two people to help with walking and/or transfers;Two people to help with bathing/dressing/bathroom;Assistance with cooking/housework;Direct supervision/assist for medications management;Direct supervision/assist for financial management;Assist for transportation;Help with stairs or ramp for entrance   No    Equipment Recommendations  (has necessary equipment)  Recommendations for Other Services  OT consult    Functional Status Assessment Patient has had a recent decline in  their functional status and demonstrates the ability to make significant improvements in function in a reasonable and predictable amount of time.     Precautions / Restrictions Precautions Precautions: Fall Precaution Comments: frequent falls Restrictions Weight Bearing Restrictions: Yes RLE Weight Bearing: Weight bearing as tolerated      Mobility  Bed Mobility Overal bed mobility: Needs Assistance Bed Mobility: Supine to Sit, Sit to Supine     Supine to sit: Total assist, HOB elevated Sit to supine: Total assist   General bed mobility comments: maximal cuing for reaching to bed rail to assist in coming to EoB, requires total A for managing R LE movement to EoB, unable to flex at waist to achieve seated, requiring total A  for posterior lean, total A for managing LE back to bed        Balance Overall balance assessment: History of Falls, Needs assistance Sitting-balance support: Feet supported, No upper extremity supported, Single extremity supported Sitting balance-Leahy Scale: Zero Sitting balance - Comments: increased posterior lean due to R hip pain, never achieves fully upright                                     Pertinent Vitals/Pain Pain Assessment Pain Assessment: Faces Faces Pain Scale: Hurts whole lot Pain Location: R hip with movement Pain Descriptors / Indicators: Grimacing, Guarding, Moaning (retropulsion) Pain Intervention(s): Limited activity within patient's tolerance, Monitored during session, Repositioned    Home Living Family/patient expects to be discharged to:: Private residence Living Arrangements: Spouse/significant other;Children Available Help at Discharge: Available PRN/intermittently Type  of Home: House Home Access: Stairs to enter Entrance Stairs-Rails:  (grab bars on porch and into door) Secretary/administrator of Steps: 1+small step+1   Home Layout: One level Home Equipment: Shower seat - built in;Hand held Office manager (2 wheels);Cane - single point;BSC/3in1 Additional Comments: wife works and could try to patch together 24 hr care    Prior Function Prior Level of Function : Independent/Modified Independent             Mobility Comments: at home by himself during the day, has RW does not use it, ADLs Comments: minor assist for ADLs, wife completes iADLs     Hand Dominance   Dominant Hand: Right    Extremity/Trunk Assessment   Upper Extremity Assessment Upper Extremity Assessment: Overall WFL for tasks assessed    Lower Extremity Assessment Lower Extremity Assessment: RLE deficits/detail;Generalized weakness RLE Deficits / Details: ROM limited by pain RLE: Unable to fully assess due to pain    Cervical / Trunk Assessment Cervical / Trunk Assessment: Kyphotic  Communication   Communication: HOH  Cognition Arousal/Alertness: Awake/alert Behavior During Therapy: Flat affect Overall Cognitive Status: Impaired/Different from baseline Area of Impairment: Orientation, Attention, Memory, Following commands, Safety/judgement, Awareness, Problem solving                 Orientation Level: Disoriented to, Place, Time Current Attention Level: Selective Memory: Decreased short-term memory Following Commands: Follows one step commands inconsistently, Follows multi-step commands inconsistently Safety/Judgement: Decreased awareness of safety (wants to use RW which is across room to pull up to seated EoB) Awareness: Emergent Problem Solving: Slow processing, Decreased initiation, Difficulty sequencing, Requires verbal cues, Requires tactile cues General Comments: oriented only to self, poor safety awareness requesting RW be moved to EoB so that he can pull up on it to come to seated        General Comments General comments (skin integrity, edema, etc.): VSS on RA, surgical site clean, dry and bandaging intact        Assessment/Plan    PT Assessment Patient needs  continued PT services  PT Problem List Decreased range of motion;Decreased strength;Decreased activity tolerance;Decreased mobility;Decreased balance;Decreased coordination;Decreased cognition;Decreased knowledge of use of DME;Decreased safety awareness;Pain       PT Treatment Interventions DME instruction;Gait training;Functional mobility training;Therapeutic activities;Therapeutic exercise;Balance training;Cognitive remediation;Patient/family education    PT Goals (Current goals can be found in the Care Plan section)  Acute Rehab PT Goals PT Goal Formulation: With patient/family Time For Goal Achievement: 04/20/23 Potential to Achieve Goals: Fair    Frequency Min 2X/week        AM-PAC PT "6 Clicks" Mobility  Outcome Measure Help needed turning from your back to your side while in a flat bed without using bedrails?: Total Help needed moving from lying on your back to sitting on the side of a flat bed without using bedrails?: Total Help needed moving to and from a bed to a chair (including a wheelchair)?: Total Help needed standing up from a chair using your arms (e.g., wheelchair or bedside chair)?: Total Help needed to walk in hospital room?: Total Help needed climbing 3-5 steps with a railing? : Total 6 Click Score: 6    End of Session   Activity Tolerance: Patient limited by pain Patient left: in bed;with call bell/phone within reach;with bed alarm set;with family/visitor present Nurse Communication: Mobility status PT Visit Diagnosis: Difficulty in walking, not elsewhere classified (R26.2);Pain;Muscle weakness (generalized) (M62.81);Repeated falls (R29.6);History of falling (Z91.81)    Time: 6045-4098  PT Time Calculation (min) (ACUTE ONLY): 30 min   Charges:   PT Evaluation $PT Eval Moderate Complexity: 1 Mod PT Treatments $Therapeutic Activity: 8-22 mins PT General Charges $$ ACUTE PT VISIT: 1 Visit         Mael Delap B. Beverely Risen PT, DPT Acute Rehabilitation  Services Please use secure chat or  Call Office [phone]   Elon Alas [facility] 04/06/2023, 10:18 AM

## 2023-04-06 NOTE — Progress Notes (Signed)
Subjective: 1 Day Post-Op Procedure(s) (LRB): RIGHT TOTAL HIP ARTHROPLASTY ANTERIOR APPROACH (Right) Patient is resting this am.  Tolerated surgery well yesterday.   Objective: Vital signs in last 24 hours: Temp:  [97 F (36.1 C)-98.6 F (37 C)] 98.4 F (36.9 C) (07/12 0441) Pulse Rate:  [65-94] 83 (07/12 0441) Resp:  [10-20] 18 (07/12 0441) BP: (90-141)/(50-80) 108/80 (07/12 0441) SpO2:  [92 %-98 %] 95 % (07/12 0441) Weight:  [90 kg] 90 kg (07/11 1020)  Intake/Output from previous day: 07/11 0701 - 07/12 0700 In: 650 [I.V.:600; IV Piggyback:50] Out: 700 [Urine:400; Blood:300] Intake/Output this shift: Total I/O In: -  Out: 400 [Urine:400]  Recent Labs    04/04/23 0945 04/05/23 0337 04/06/23 0237  HGB 17.3* 15.1 14.5   Recent Labs    04/05/23 0337 04/06/23 0237  WBC 12.2* 11.1*  RBC 5.15 4.90  HCT 44.5 43.1  PLT 221 186   Recent Labs    04/05/23 0337 04/06/23 0237  NA 133* 134*  K 4.6 4.0  CL 103 102  CO2 21* 24  BUN 15 25*  CREATININE 1.25* 1.41*  GLUCOSE 161* 128*  CALCIUM 8.8* 8.3*   Recent Labs    04/04/23 0945  INR 1.1    Intact pulses distally Incision: dressing C/D/I Compartment soft   Assessment/Plan: 1 Day Post-Op Procedure(s) (LRB): RIGHT TOTAL HIP ARTHROPLASTY ANTERIOR APPROACH (Right) Up with therapy 81 mg aspirin BID for DVT coverage     [name] 04/06/2023, 6:29 AM

## 2023-04-06 NOTE — Discharge Instructions (Signed)

## 2023-04-06 NOTE — Care Management Important Message (Signed)
Important Message  Patient Details  Name: Trevor Ward MRN: [id number] Date of Birth: Aug 07, 1941   Medicare Important Message Given:  Yes     Sherilyn Banker 04/06/2023, 3:49 PM

## 2023-04-07 DIAGNOSIS — W19XXXA Unspecified fall, initial encounter: Secondary | ICD-10-CM | POA: Diagnosis not present

## 2023-04-07 DIAGNOSIS — S72001A Fracture of unspecified part of neck of right femur, initial encounter for closed fracture: Secondary | ICD-10-CM | POA: Diagnosis not present

## 2023-04-07 LAB — CBC
HCT: 39.8 % (ref 39.0–52.0)
Hemoglobin: 13.5 g/dL (ref 13.0–17.0)
MCH: 29.4 pg (ref 26.0–34.0)
MCHC: 33.9 g/dL (ref 30.0–36.0)
MCV: 86.7 fL (ref 80.0–100.0)
Platelets: 170 10*3/uL (ref 150–400)
RBC: 4.59 MIL/uL (ref 4.22–5.81)
RDW: 14.1 % (ref 11.5–15.5)
WBC: 9.9 10*3/uL (ref 4.0–10.5)
nRBC: 0 % (ref 0.0–0.2)

## 2023-04-07 LAB — GLUCOSE, CAPILLARY
Glucose-Capillary: 87 mg/dL (ref 70–99)
Glucose-Capillary: 160 mg/dL — ABNORMAL HIGH (ref 70–99)
Glucose-Capillary: 133 mg/dL — ABNORMAL HIGH (ref 70–99)
Glucose-Capillary: 99 mg/dL (ref 70–99)

## 2023-04-07 LAB — COMPREHENSIVE METABOLIC PANEL
ALT: 11 U/L (ref 0–44)
AST: 17 U/L (ref 15–41)
Albumin: 2.7 g/dL — ABNORMAL LOW (ref 3.5–5.0)
Alkaline Phosphatase: 44 U/L (ref 38–126)
Anion gap: 9 (ref 5–15)
BUN: 22 mg/dL (ref 8–23)
CO2: 23 mmol/L (ref 22–32)
Calcium: 8.1 mg/dL — ABNORMAL LOW (ref 8.9–10.3)
Chloride: 101 mmol/L (ref 98–111)
Creatinine, Ser: 1.3 mg/dL — ABNORMAL HIGH (ref 0.61–1.24)
GFR, Estimated: 54 mL/min — ABNORMAL LOW (ref >60)
Glucose, Bld: 93 mg/dL (ref 70–99)
Potassium: 4.2 mmol/L (ref 3.5–5.1)
Sodium: 133 mmol/L — ABNORMAL LOW (ref 135–145)
Total Bilirubin: 0.9 mg/dL (ref 0.3–1.2)
Total Protein: 5.8 g/dL — ABNORMAL LOW (ref 6.5–8.1)

## 2023-04-07 MED ORDER — ASPIRIN 81 MG PO CHEW: 81.0000 mg | CHEWABLE_TABLET | Freq: Two times a day (BID) | ORAL | 0 refills | Status: AC

## 2023-04-07 MED ORDER — HYDROCODONE-ACETAMINOPHEN 5-325 MG PO TABS: 1.0000 | ORAL_TABLET | Freq: Four times a day (QID) | ORAL | 0 refills | Status: AC | PRN

## 2023-04-07 MED ORDER — POLYETHYLENE GLYCOL 3350 17 G PO PACK
17.0000 g | PACK | Freq: Every day | ORAL | Status: DC
  Administered 2023-04-07 – 2023-04-09 (×3): 17 g via ORAL
  Filled 2023-04-07 (×2): qty 1

## 2023-04-07 NOTE — Evaluation (Signed)
Occupational Therapy Evaluation Patient Details Name: Trevor Ward MRN: [id number] DOB: 1941-05-18 Today's Date: 04/07/2023   History of Present Illness 82 year old gentleman presents to ED 7/9 s/p fall at home. Found to have acute significantly displaced right hip femoral neck fracture s/p 7/11 right direct anterior hip replacement. [id number], CAD s/p CABG, PAD, AAA, gait disturbance, and mild dementia   Clinical Impression   Pt admitted for above dx,  he reports some assist with bADLs at baseline and ambulating no AD. Pt currently very limited by pain and not tolerating EOB sitting despite manual facilitation of hips/trunk by OT. Pt with strong retropulsive lean in response to pain, he remains bed level for bADLs at this time. Pt would benefit from continued acute skilled OT services to address deficits and help transition to next level of care. Patient would benefit from post acute skilled rehab facility with <3 hours of therapy and 24/7 support      Recommendations for follow up therapy are one component of a multi-disciplinary discharge planning process, led by the attending physician.  Recommendations may be updated based on patient status, additional functional criteria and insurance authorization.   Assistance Recommended at Discharge Frequent or constant Supervision/Assistance  Patient can return home with the following Two people to help with walking and/or transfers;A lot of help with bathing/dressing/bathroom;Direct supervision/assist for financial management;Direct supervision/assist for medications management;Assist for transportation;Assistance with cooking/housework;Help with stairs or ramp for entrance    Functional Status Assessment  Patient has had a recent decline in their functional status and demonstrates the ability to make significant improvements in function in a reasonable and predictable amount of time.  Equipment Recommendations  None recommended by OT  (defer to next level of care)    Recommendations for Other Services       Precautions / Restrictions Precautions Precautions: Fall Precaution Comments: frequent falls Restrictions Weight Bearing Restrictions: Yes RLE Weight Bearing: Weight bearing as tolerated      Mobility Bed Mobility Overal bed mobility: Needs Assistance Bed Mobility: Supine to Sit, Sit to Supine     Supine to sit: Max assist Sit to supine: Total assist   General bed mobility comments: Pt needing cues to sequence steps and use bed rails to engage in bed mobility, not able to managem RLE. Pt not able to acheive full upright sitting on his own. Total A to return to bed, pt with strong retropulsive lean    Transfers                   General transfer comment: NT      Balance Overall balance assessment: History of Falls, Needs assistance Sitting-balance support: Feet supported, No upper extremity supported, Single extremity supported Sitting balance-Leahy Scale: Zero Sitting balance - Comments: Very limited by pain, does not sit upright. OT manual facilitation of hips to bring trunk into mindline position but pt not able to tolerate Postural control: Posterior lean                                 ADL either performed or assessed with clinical judgement   ADL Overall ADL's : Needs assistance/impaired Eating/Feeding: Independent;Bed level   Grooming: Bed level;Set up   Upper Body Bathing: Bed level;Set up   Lower Body Bathing: Bed level;Maximal assistance   Upper Body Dressing : Set up;Bed level   Lower Body Dressing: Total assistance;Bed level     Toilet Transfer Details (indicate  cue type and reason): NT   Toileting - Clothing Manipulation Details (indicate cue type and reason): NT   Tub/Shower Transfer Details (indicate cue type and reason): NT   General ADL Comments: Pt not able to tolerate EOB sitting, OOB mobility deferred, bedside bADLs for pt/therapist safety      Vision         Perception     Praxis      Pertinent Vitals/Pain Pain Assessment Pain Assessment: Faces Faces Pain Scale: Hurts whole lot Pain Location: R hip with movement Pain Descriptors / Indicators: Grimacing, Guarding, Moaning (retropulsion) Pain Intervention(s): Limited activity within patient's tolerance, Monitored during session, Repositioned     Hand Dominance Right   Extremity/Trunk Assessment Upper Extremity Assessment Upper Extremity Assessment: Overall WFL for tasks assessed   Lower Extremity Assessment RLE Deficits / Details: ROM limited by pain RLE: Unable to fully assess due to pain   Cervical / Trunk Assessment Cervical / Trunk Assessment: Kyphotic   Communication Communication Communication: HOH   Cognition Arousal/Alertness: Awake/alert Behavior During Therapy: WFL for tasks assessed/performed Overall Cognitive Status: Impaired/Different from baseline Area of Impairment: Safety/judgement, Following commands                       Following Commands: Follows one step commands inconsistently, Follows one step commands with increased time Safety/Judgement: Decreased awareness of safety           General Comments  VSS on RA, surgical site clean    Exercises     Shoulder Instructions      Home Living Family/patient expects to be discharged to:: Private residence Living Arrangements: Spouse/significant other;Children Available Help at Discharge: Available PRN/intermittently Type of Home: House Home Access: Stairs to enter [employer] of Steps: 1+small step+1 Entrance Stairs-Rails:  (grab bars on porch and into door) Home Layout: One level     Bathroom Shower/Tub: Producer, television/film/video: Standard (with BSC over)     Home Equipment: Shower seat - built in;Hand held Programmer, systems (2 wheels);Cane - single point;BSC/3in1   Additional Comments: wife works and could try to patch together 24  hr care      Prior Functioning/Environment Prior Level of Function : Independent/Modified Independent             Mobility Comments: at home by himself during the day, has RW does not use it, ADLs Comments: minor assist for ADLs, wife completes iADLs        OT Problem List: Pain;Impaired balance (sitting and/or standing);Decreased activity tolerance      OT Treatment/Interventions: Self-care/ADL training;Therapeutic activities;Patient/family education;DME and/or AE instruction;Balance training    OT Goals(Current goals can be found in the care plan section) Acute Rehab OT Goals Patient Stated Goal: To reduce pain OT Goal Formulation: With patient Time For Goal Achievement: 04/21/23 Potential to Achieve Goals: Good ADL Goals Pt Will Perform Grooming: sitting;with min guard assist Pt Will Perform Lower Body Bathing: sitting/lateral leans;with min assist Pt Will Perform Lower Body Dressing: sitting/lateral leans;with min assist Additional ADL Goal #1: Pt will tolerate 5 mins EOB sitting with Min guard in preparation for bedside ADLs  OT Frequency: Min 1X/week       AM-PAC OT "6 Clicks" Daily Activity     Outcome Measure Help from another person eating meals?: None Help from another person taking care of personal grooming?: A Little Help from another person toileting, which includes using toliet, bedpan, or urinal?: Total Help from another person  bathing (including washing, rinsing, drying)?: A Lot Help from another person to put on and taking off regular upper body clothing?: A Little Help from another person to put on and taking off regular lower body clothing?: Total 6 Click Score: 14   End of Session Nurse Communication: Need for lift equipment  Activity Tolerance: Patient limited by pain Patient left: in bed;with call bell/phone within reach;with family/visitor present  OT Visit Diagnosis: Unsteadiness on feet (R26.81);Other abnormalities of gait and mobility  (R26.89);History of falling (Z91.81);Pain Pain - Right/Left: Right Pain - part of body: Hip                Time: 0865-7846 OT Time Calculation (min): 27 min Charges:  OT General Charges $OT Visit: 1 Visit OT Evaluation $OT Eval Moderate Complexity: 1 Mod OT Treatments $Therapeutic Activity: 8-22 mins  04/07/2023  AB, OTR/L  Acute Rehabilitation Services  Office: [phone]   Tristan Schroeder 04/07/2023, 3:58 PM

## 2023-04-07 NOTE — Progress Notes (Signed)
  Progress Note   Patient: Trevor Ward [id number] DOB: Feb 21, 1941 DOA: 04/04/2023     3 DOS: the patient was seen and examined on 04/07/2023   Brief hospital course: 82 y.o. male with medical history significant of hypertension, CAD s/p CABG, PAD, AAA, gait disturbance, and mild dementia who presents after having a fall at home.  Patient lives at home with his wife and normally gets around without use of any assistive devices although wife makes note that he should.  He had been wearing socks on a slick floor when he had been down to do something with the dogs and fell.  Landed on his right hip and reported having immediate pain.  He was unable to bear weight or walk due to the pain.  Denied any loss of consciousness or trauma to his head.  He is wife makes note that he previously fractured with a left hip in 01/2022.    X-rays of the pelvis noted a acute mildly displaced right femoral neck fracture.  Orthopedics had been consulted.   Assessment and Plan: Right femoral neck fracture secondary to fall Patient presents recurrent fall at home.   X-rays reveal mildly displaced right femoral neck fracture.   -Orthopedic Surgery following. Pt is now s/p R anterior total hip arthoplasty 7/11 -cont analgesia as needed -PT recs for SNF. TOC following for placement   CAD Patient with prior history of three-vessel CABG in 2017.  EKG revealed normal sinus rhythm. -Continue aspirin, beta-blocker, and Crestor   Essential hypertension Patient had only been taking metoprolol tartrate 25 mg nightly instead of twice daily as prescribed. BP remains stable and controlled    History of gout -Continue allopurinol   Dyslipidemia -Continue Crestor   AAA Patient noted to have ectasia bordering on mild aneurysmal dilation of the ascending thoracic aorta with a maximum diameter 3.9 cm last checked in 06/2021. -Recommend repeat imaging in outpatient setting  Dementia -improved after addition of  seroquel -cont supportive care  ARF -Cr peaked up to 1.41 -Encourage PO as tolerated -Cont on LR at 75cc/hr -Cr improving -Recheck bmet in AM   Subjective: Slept better overnight. Mentation improved this AM  Physical Exam: Vitals:   04/06/23 1954 04/07/23 0400 04/07/23 1009 04/07/23 1716  BP: 120/78 (!) 110/52 122/67 (!) 140/70  Pulse: 99 82 93 97  Resp: (!) 21 18 16 16   Temp: 100 F (37.8 C) 98.4 F (36.9 C) 99.9 F (37.7 C) 98.2 F (36.8 C)  TempSrc: Axillary Oral Oral Oral  SpO2: 93% 95% 95% 96%  Weight:      Height:       General exam: Awake, laying in bed, in nad Respiratory system: Normal respiratory effort, no wheezing Cardiovascular system: regular rate, s1, s2 Gastrointestinal system: Soft, nondistended, positive BS Central nervous system: CN2-12 grossly intact, strength intact Extremities: Perfused, no clubbing Skin: Normal skin turgor, no notable skin lesions seen Psychiatry: Mood normal // no visual hallucinations   Data Reviewed:  Labs reviewed: Na 133, K 4.2, Cr 1.30, WBC 9.9, hgb 13.5  Family Communication: Pt in room, family at bedside  Disposition: Status is: Inpatient Remains inpatient appropriate because: Severity of illness  Planned Discharge Destination: Skilled nursing facility     Author: Rickey Barbara, MD 04/07/2023 6:32 PM  For on call review [email or web address].

## 2023-04-07 NOTE — Progress Notes (Signed)
Patient ID: Trevor Ward, male   DOB: 09-13-1941, 82 y.o.   MRN: [id number] The patient is awake and alert and having breakfast in bed this morning.  Family is at the bedside.  He is close to his baseline dementia.  His vital signs are stable and his labs are stable.  Short-term skilled nursing has been recommended.  His right operative hip is stable today.  His leg lengths are equal.  His left hip dressing is clean and dry.  Attempts can still be made to help with his mobility and he is up with therapy and assistance.

## 2023-04-08 DIAGNOSIS — W19XXXA Unspecified fall, initial encounter: Secondary | ICD-10-CM | POA: Diagnosis not present

## 2023-04-08 DIAGNOSIS — S72001A Fracture of unspecified part of neck of right femur, initial encounter for closed fracture: Secondary | ICD-10-CM | POA: Diagnosis not present

## 2023-04-08 LAB — COMPREHENSIVE METABOLIC PANEL
ALT: 13 U/L (ref 0–44)
AST: 17 U/L (ref 15–41)
Albumin: 2.6 g/dL — ABNORMAL LOW (ref 3.5–5.0)
Alkaline Phosphatase: 49 U/L (ref 38–126)
Anion gap: 8 (ref 5–15)
BUN: 24 mg/dL — ABNORMAL HIGH (ref 8–23)
CO2: 21 mmol/L — ABNORMAL LOW (ref 22–32)
Calcium: 8.1 mg/dL — ABNORMAL LOW (ref 8.9–10.3)
Chloride: 101 mmol/L (ref 98–111)
Creatinine, Ser: 1.13 mg/dL (ref 0.61–1.24)
GFR, Estimated: >60 mL/min (ref >60)
Glucose, Bld: 142 mg/dL — ABNORMAL HIGH (ref 70–99)
Potassium: 4.3 mmol/L (ref 3.5–5.1)
Sodium: 130 mmol/L — ABNORMAL LOW (ref 135–145)
Total Bilirubin: 1.5 mg/dL — ABNORMAL HIGH (ref 0.3–1.2)
Total Protein: 6 g/dL — ABNORMAL LOW (ref 6.5–8.1)

## 2023-04-08 LAB — GLUCOSE, CAPILLARY
Glucose-Capillary: 128 mg/dL — ABNORMAL HIGH (ref 70–99)
Glucose-Capillary: 146 mg/dL — ABNORMAL HIGH (ref 70–99)
Glucose-Capillary: 154 mg/dL — ABNORMAL HIGH (ref 70–99)
Glucose-Capillary: 131 mg/dL — ABNORMAL HIGH (ref 70–99)

## 2023-04-08 LAB — CBC
HCT: 40 % (ref 39.0–52.0)
Hemoglobin: 13.4 g/dL (ref 13.0–17.0)
MCH: 29 pg (ref 26.0–34.0)
MCHC: 33.5 g/dL (ref 30.0–36.0)
MCV: 86.6 fL (ref 80.0–100.0)
Platelets: 184 10*3/uL (ref 150–400)
RBC: 4.62 MIL/uL (ref 4.22–5.81)
RDW: 13.9 % (ref 11.5–15.5)
WBC: 9.6 10*3/uL (ref 4.0–10.5)
nRBC: 0 % (ref 0.0–0.2)

## 2023-04-08 NOTE — Progress Notes (Signed)
  Progress Note   Patient: Trevor Ward ZOX:[id number] DOB: 1941/02/21 DOA: 04/04/2023     4 DOS: the patient was seen and examined on 04/08/2023   Brief hospital course: 82 y.o. male with medical history significant of hypertension, CAD s/p CABG, PAD, AAA, gait disturbance, and mild dementia who presents after having a fall at home.  Patient lives at home with his wife and normally gets around without use of any assistive devices although wife makes note that he should.  He had been wearing socks on a slick floor when he had been down to do something with the dogs and fell.  Landed on his right hip and reported having immediate pain.  He was unable to bear weight or walk due to the pain.  Denied any loss of consciousness or trauma to his head.  He is wife makes note that he previously fractured with a left hip in 01/2022.    X-rays of the pelvis noted a acute mildly displaced right femoral neck fracture.  Orthopedics had been consulted.   Assessment and Plan: Right femoral neck fracture secondary to fall Patient presents recurrent fall at home.   X-rays reveal mildly displaced right femoral neck fracture.   -Orthopedic Surgery following. Pt is now s/p R anterior total hip arthoplasty 7/11 -cont analgesia as needed -TOC following for SNF placement   CAD Patient with prior history of three-vessel CABG in 2017.  EKG revealed normal sinus rhythm. -Continue aspirin, beta-blocker, and Crestor   Essential hypertension Patient had only been taking metoprolol tartrate 25 mg nightly instead of twice daily as prescribed. BP remains stable and controlled    History of gout -Continue allopurinol   Dyslipidemia -Continue Crestor   AAA Patient noted to have ectasia bordering on mild aneurysmal dilation of the ascending thoracic aorta with a maximum diameter 3.9 cm last checked in 06/2021. -Recommend repeat imaging in outpatient setting  Dementia -improved after addition of seroquel -cont  supportive care  ARF -Cr peaked up to 1.41 -Cr improved with basal IVF to 1.13 -Cont to follow renal panel  Subjective: Without complaints this AM  Physical Exam: Vitals:   04/07/23 2022 04/08/23 0501 04/08/23 0828 04/08/23 1530  BP: (!) 142/69 (!) 107/52 108/60 117/62  Pulse: 97 75 77 88  Resp: 20 20  17   Temp: 99 F (37.2 C) 98.5 F (36.9 C) 98.2 F (36.8 C) 98.3 F (36.8 C)  TempSrc: Oral     SpO2: 97% 92% 95% 97%  Weight:      Height:       General exam: Conversant, in no acute distress Respiratory system: normal chest rise, clear, no audible wheezing Cardiovascular system: regular rhythm, s1-s2 Gastrointestinal system: Nondistended, nontender, pos BS Central nervous system: No seizures, no tremors Extremities: No cyanosis, no joint deformities Skin: No rashes, no pallor Psychiatry: Affect normal // no auditory hallucinations   Data Reviewed:  Labs reviewed: Na 130, K 4.3, Cr 1.13, WBC 9.6, Hgb 13.4  Family Communication: Pt in room, family at bedside  Disposition: Status is: Inpatient Remains inpatient appropriate because: Severity of illness  Planned Discharge Destination: Skilled nursing facility     Author: Rickey Barbara, MD 04/08/2023 5:54 PM  For on call review [email or web address].

## 2023-04-08 NOTE — Plan of Care (Signed)
  Problem: Education: Goal: Knowledge of General Education information will improve Description: Including pain rating scale, medication(s)/side effects and non-pharmacologic comfort measures Outcome: Progressing   Problem: Health Behavior/Discharge Planning: Goal: Ability to manage health-related needs will improve Outcome: Progressing   Problem: Clinical Measurements: Goal: Will remain free from infection Outcome: Progressing   Problem: Pain Managment: Goal: General experience of comfort will improve Outcome: Progressing   Problem: Safety: Goal: Ability to remain free from injury will improve Outcome: Progressing   

## 2023-04-08 NOTE — TOC Progression Note (Signed)
Transition of Care [facility]) - Progression Note    Patient Details  Name: Trevor Ward MRN: [id number] Date of Birth: 05-18-1941  [facility]) CM/SW Contact  Jimmy Picket, [place] Phone Number: 04/08/2023, 1:09 PM  Clinical Narrative:     CSW spoke to pts wife [name] via phone. CSW explained that we are still waiting on a reply from [facility]. [name] gave CSW permission to fax to other facilities in the area as a back up. [name] reports [facility] is still their first choice.   Expected Discharge Plan: Skilled Nursing Facility Barriers to Discharge: Continued Medical Work up  Expected Discharge Plan and Services       Living arrangements for the past 2 months: Single Family Home                                       Social Determinants of Health (SDOH) Interventions SDOH Screenings   Food Insecurity: No Food Insecurity (04/04/2023)  Housing: Low Risk  (04/04/2023)  Transportation Needs: No Transportation Needs (04/04/2023)  Utilities: Not At Risk (04/04/2023)  Tobacco Use: Medium Risk (04/05/2023)    Readmission Risk Interventions     No data to display

## 2023-04-09 DIAGNOSIS — S72001A Fracture of unspecified part of neck of right femur, initial encounter for closed fracture: Secondary | ICD-10-CM | POA: Diagnosis not present

## 2023-04-09 LAB — GLUCOSE, CAPILLARY
Glucose-Capillary: 85 mg/dL (ref 70–99)
Glucose-Capillary: 107 mg/dL — ABNORMAL HIGH (ref 70–99)
Glucose-Capillary: 136 mg/dL — ABNORMAL HIGH (ref 70–99)
Glucose-Capillary: 105 mg/dL — ABNORMAL HIGH (ref 70–99)

## 2023-04-09 LAB — CBC
HCT: 38.5 % — ABNORMAL LOW (ref 39.0–52.0)
Hemoglobin: 12.9 g/dL — ABNORMAL LOW (ref 13.0–17.0)
MCH: 29.4 pg (ref 26.0–34.0)
MCHC: 33.5 g/dL (ref 30.0–36.0)
MCV: 87.7 fL (ref 80.0–100.0)
Platelets: 193 10*3/uL (ref 150–400)
RBC: 4.39 MIL/uL (ref 4.22–5.81)
RDW: 13.8 % (ref 11.5–15.5)
WBC: 8 10*3/uL (ref 4.0–10.5)
nRBC: 0 % (ref 0.0–0.2)

## 2023-04-09 LAB — COMPREHENSIVE METABOLIC PANEL
ALT: 22 U/L (ref 0–44)
AST: 28 U/L (ref 15–41)
Albumin: 2.4 g/dL — ABNORMAL LOW (ref 3.5–5.0)
Alkaline Phosphatase: 60 U/L (ref 38–126)
Anion gap: 7 (ref 5–15)
BUN: 25 mg/dL — ABNORMAL HIGH (ref 8–23)
CO2: 26 mmol/L (ref 22–32)
Calcium: 8.2 mg/dL — ABNORMAL LOW (ref 8.9–10.3)
Chloride: 100 mmol/L (ref 98–111)
Creatinine, Ser: 1.04 mg/dL (ref 0.61–1.24)
GFR, Estimated: >60 mL/min (ref >60)
Glucose, Bld: 121 mg/dL — ABNORMAL HIGH (ref 70–99)
Potassium: 3.8 mmol/L (ref 3.5–5.1)
Sodium: 133 mmol/L — ABNORMAL LOW (ref 135–145)
Total Bilirubin: 1 mg/dL (ref 0.3–1.2)
Total Protein: 5.9 g/dL — ABNORMAL LOW (ref 6.5–8.1)

## 2023-04-09 MED ORDER — POLYETHYLENE GLYCOL 3350 17 G PO PACK
17.0000 g | PACK | Freq: Two times a day (BID) | ORAL | Status: DC
  Administered 2023-04-09 – 2023-04-10 (×2): 17 g via ORAL
  Filled 2023-04-09 (×2): qty 1

## 2023-04-09 NOTE — Care Management Important Message (Signed)
Important Message  Patient Details  Name: Trevor Ward DOBLER MRN: [id number] Date of Birth: Jul 22, 1941   Medicare Important Message Given:  Yes     Sherilyn Banker 04/09/2023, 3:59 PM

## 2023-04-09 NOTE — Progress Notes (Signed)
  Progress Note   Patient: Trevor Ward QIH:[id number] DOB: 1941/02/04 DOA: 04/04/2023     5 DOS: the patient was seen and examined on 04/09/2023   Brief hospital course: 82 y.o. male with medical history significant of hypertension, CAD s/p CABG, PAD, AAA, gait disturbance, and mild dementia who presents after having a fall at home.  Patient lives at home with his wife and normally gets around without use of any assistive devices although wife makes note that he should.  He had been wearing socks on a slick floor when he had been down to do something with the dogs and fell.  Landed on his right hip and reported having immediate pain.  He was unable to bear weight or walk due to the pain.  Denied any loss of consciousness or trauma to his head.  He is wife makes note that he previously fractured with a left hip in 01/2022.    X-rays of the pelvis noted a acute mildly displaced right femoral neck fracture.  Orthopedics had been consulted.   Assessment and Plan: Right femoral neck fracture secondary to fall Patient presents recurrent fall at home.   X-rays reveal mildly displaced right femoral neck fracture.   -Orthopedic Surgery following. Pt is now s/p R anterior total hip arthoplasty 7/11 -cont analgesia as needed -TOC following, planning SNF placement   CAD Patient with prior history of three-vessel CABG in 2017.  EKG revealed normal sinus rhythm. -Continue aspirin, beta-blocker, and Crestor   Essential hypertension Patient had only been taking metoprolol tartrate 25 mg nightly instead of twice daily as prescribed. BP remains stable and controlled    History of gout -Continue allopurinol   Dyslipidemia -Continue Crestor   AAA Patient noted to have ectasia bordering on mild aneurysmal dilation of the ascending thoracic aorta with a maximum diameter 3.9 cm last checked in 06/2021. -Recommend repeat imaging in outpatient setting  Dementia -improved after addition of seroquel. Pt  now sleeping better -cont supportive care  ARF -Cr peaked up to 1.04 -Cr improved with basal IVF to 1.13 -recheck renal panel in AM  Subjective: In good spirits this AM. Pt's wife shaving pt at time of encounter  Physical Exam: Vitals:   04/08/23 1530 04/08/23 1932 04/09/23 0515 04/09/23 0746  BP: 117/62 134/66 (!) 128/55 127/62  Pulse: 88 89 74 77  Resp: 17 17 17 17   Temp: 98.3 F (36.8 C) 98.4 F (36.9 C) 98.4 F (36.9 C) 98.5 F (36.9 C)  TempSrc:   Oral   SpO2: 97% 96% 97% 96%  Weight:      Height:       General exam: Conversant, in no acute distress Respiratory system: normal chest rise, clear, no audible wheezing Cardiovascular system: regular rhythm, s1-s2 Gastrointestinal system: Nondistended, nontender, pos BS Central nervous system: No seizures, no tremors Extremities: No cyanosis, no joint deformities Skin: No rashes, no pallor Psychiatry: Affect normal // no auditory hallucinations   Data Reviewed:  Labs reviewed: Na 133, K 3.8, Cr 1.04, WBC 8.0  Family Communication: Pt in room, family at bedside  Disposition: Status is: Inpatient Remains inpatient appropriate because: Severity of illness  Planned Discharge Destination: Skilled nursing facility     Author: Rickey Barbara, MD 04/09/2023 5:54 PM  For on call review [email or web address].

## 2023-04-09 NOTE — TOC Progression Note (Signed)
Transition of Care [facility]) - Progression Note    Patient Details  Name: Trevor Ward MRN: [id number] Date of Birth: May 11, 1941  Transition of Care [facility]) CM/SW Contact  Lorri Frederick, LCSW Phone Number: 04/09/2023, 2:33 PM  Clinical Narrative: [facility] does offer bed.  CSW spoke with [name], who does want to accept this offer.  CSW confirmed [facility] can receive pt tomorrow.  Need new PT note for insurance auth, CSW contacted PT to see if they could see pt today.     Expected Discharge Plan: Skilled Nursing Facility Barriers to Discharge: Continued Medical Work up  Expected Discharge Plan and Services       Living arrangements for the past 2 months: Single Family Home                                       Social Determinants of Health (SDOH) Interventions SDOH Screenings   Food Insecurity: No Food Insecurity (04/04/2023)  Housing: Low Risk  (04/04/2023)  Transportation Needs: No Transportation Needs (04/04/2023)  Utilities: Not At Risk (04/04/2023)  Tobacco Use: Medium Risk (04/05/2023)    Readmission Risk Interventions     No data to display

## 2023-04-09 NOTE — Progress Notes (Signed)
Physical Therapy Treatment Patient Details Name: Trevor Ward MRN: [id number] DOB: 25-Apr-1941 Today's Date: 04/09/2023   History of Present Illness 82 year old gentleman presents to ED 7/9 s/p fall at home. Found to have acute significantly displaced right hip femoral neck fracture s/p 7/11 right direct anterior hip replacement. [id number], CAD s/p CABG, PAD, AAA, gait disturbance, and mild dementia    PT Comments  Patient progressing this session able to transition to standing at the bedside with lifting help from elevated surface.  Patient unable to progress to taking steps, but stood almost a full minute.  Demonstrated improved sitting balance after standing, but fatigued while on EOB.  Max to total A for transition from and back into bed.  Patient likely would benefit from pain meds at least 30 minutes prior (RN delivered just prior to session).  Patient appropriate for post-acute inpatient rehab <3 hours/day.  PT will continue to follow.      Assistance Recommended at Discharge Frequent or constant Supervision/Assistance  If plan is discharge home, recommend the following:  Can travel by private vehicle    Two people to help with walking and/or transfers;Two people to help with bathing/dressing/bathroom;Assistance with cooking/housework;Direct supervision/assist for medications management;Direct supervision/assist for financial management;Assist for transportation;Help with stairs or ramp for entrance   No  Equipment Recommendations  Other (comment) (TBA)    Recommendations for Other Services       Precautions / Restrictions Precautions Precautions: Fall Restrictions RLE Weight Bearing: Weight bearing as tolerated     Mobility  Bed Mobility Overal bed mobility: Needs Assistance Bed Mobility: Supine to Sit, Sit to Supine     Supine to sit: Max assist Sit to supine: Total assist   General bed mobility comments: assist using L LE to scoot hips to EOB with A for R  LE; lifting help for trunk to sit; to supine assist for legs and trunk and +2 to scoot to [facility] (wife assisted) with bed in trendelenberg    Transfers Overall transfer level: Needs assistance Equipment used: Rolling walker (2 wheels) Transfers: Sit to/from Stand Sit to Stand: From elevated surface, Max assist           General transfer comment: lifting help to stand from elevated surface, cues to push up from EOB and pt posterior using pad under hips to help lift to stand    Ambulation/Gait               General Gait Details: unable to take steps   Stairs             Wheelchair Mobility     Tilt Bed    Modified Rankin (Stroke Patients Only)       Balance Overall balance assessment: Needs assistance   Sitting balance-Leahy Scale: Poor Sitting balance - Comments: leaning posterior initially with mod A for balance, improved after sit to stand pt holding walker and sitting with minguard A. Postural control: Posterior lean Standing balance support: Bilateral upper extremity supported Standing balance-Leahy Scale: Poor Standing balance comment: mod A in standing with UE support and cues for upright posture, stood about a minute                            Cognition Arousal/Alertness: Awake/alert Behavior During Therapy: WFL for tasks assessed/performed Overall Cognitive Status: Impaired/Different from baseline Area of Impairment: Following commands, Memory, Orientation  Orientation Level: Time, Disoriented to, Situation Current Attention Level: Sustained Memory: Decreased short-term memory Following Commands: Follows one step commands inconsistently, Follows one step commands with increased time Safety/Judgement: Decreased awareness of safety   Problem Solving: Slow processing, Decreased initiation, Difficulty sequencing, Requires verbal cues          Exercises Total Joint Exercises Ankle Circles/Pumps: AROM, Both, 10  reps, Supine Short Arc Quad: AROM, AAROM, Both, 5 reps, 10 reps, Supine Heel Slides: AAROM, AROM, 5 reps, Both, Supine Long Arc Quad: AROM, 5 reps, Left, Seated    General Comments General comments (skin integrity, edema, etc.): attempted to measure BP in sitting, but measurement interrupted x 2 with pt movement then with monitor on standby      Pertinent Vitals/Pain Pain Assessment Faces Pain Scale: Hurts whole lot Pain Location: R hip with movement Pain Descriptors / Indicators: Grimacing, Guarding, Moaning Pain Intervention(s): Monitored during session, Repositioned, Premedicated before session    Home Living                          Prior Function            PT Goals (current goals can now be found in the care plan section) Progress towards PT goals: Progressing toward goals    Frequency    Min 2X/week      PT Plan Current plan remains appropriate    Co-evaluation              AM-PAC PT "6 Clicks" Mobility   Outcome Measure  Help needed turning from your back to your side while in a flat bed without using bedrails?: Total Help needed moving from lying on your back to sitting on the side of a flat bed without using bedrails?: Total Help needed moving to and from a bed to a chair (including a wheelchair)?: Total Help needed standing up from a chair using your arms (e.g., wheelchair or bedside chair)?: Total Help needed to walk in hospital room?: Total Help needed climbing 3-5 steps with a railing? : Total 6 Click Score: 6    End of Session   Activity Tolerance: Patient limited by pain Patient left: in bed;with call bell/phone within reach;with family/visitor present   PT Visit Diagnosis: Difficulty in walking, not elsewhere classified (R26.2);Pain;Muscle weakness (generalized) (M62.81);Repeated falls (R29.6);History of falling (Z91.81)     Time: 1308-6578 PT Time Calculation (min) (ACUTE ONLY): 31 min  Charges:    $Therapeutic Exercise:  8-22 mins $Therapeutic Activity: 8-22 mins PT General Charges $$ ACUTE PT VISIT: 1 Visit                     Sheran Lawless, PT Acute Rehabilitation Services Office:[phone] 04/09/2023    Elray Mcgregor 04/09/2023, 4:39 PM

## 2023-04-10 DIAGNOSIS — M25551 Pain in right hip: Secondary | ICD-10-CM | POA: Diagnosis not present

## 2023-04-10 DIAGNOSIS — I714 Abdominal aortic aneurysm, without rupture, unspecified: Secondary | ICD-10-CM | POA: Diagnosis not present

## 2023-04-10 DIAGNOSIS — R413 Other amnesia: Secondary | ICD-10-CM | POA: Diagnosis not present

## 2023-04-10 DIAGNOSIS — S72001D Fracture of unspecified part of neck of right femur, subsequent encounter for closed fracture with routine healing: Secondary | ICD-10-CM | POA: Diagnosis not present

## 2023-04-10 DIAGNOSIS — E785 Hyperlipidemia, unspecified: Secondary | ICD-10-CM | POA: Diagnosis not present

## 2023-04-10 DIAGNOSIS — Z96641 Presence of right artificial hip joint: Secondary | ICD-10-CM | POA: Diagnosis not present

## 2023-04-10 DIAGNOSIS — I719 Aortic aneurysm of unspecified site, without rupture: Secondary | ICD-10-CM | POA: Diagnosis not present

## 2023-04-10 DIAGNOSIS — M109 Gout, unspecified: Secondary | ICD-10-CM | POA: Diagnosis not present

## 2023-04-10 DIAGNOSIS — Z7401 Bed confinement status: Secondary | ICD-10-CM | POA: Diagnosis not present

## 2023-04-10 DIAGNOSIS — S72001A Fracture of unspecified part of neck of right femur, initial encounter for closed fracture: Secondary | ICD-10-CM | POA: Diagnosis not present

## 2023-04-10 DIAGNOSIS — I25118 Atherosclerotic heart disease of native coronary artery with other forms of angina pectoris: Secondary | ICD-10-CM | POA: Diagnosis not present

## 2023-04-10 DIAGNOSIS — W19XXXA Unspecified fall, initial encounter: Secondary | ICD-10-CM | POA: Diagnosis not present

## 2023-04-10 DIAGNOSIS — I10 Essential (primary) hypertension: Secondary | ICD-10-CM | POA: Diagnosis not present

## 2023-04-10 DIAGNOSIS — F0390 Unspecified dementia without behavioral disturbance: Secondary | ICD-10-CM | POA: Diagnosis not present

## 2023-04-10 DIAGNOSIS — I25810 Atherosclerosis of coronary artery bypass graft(s) without angina pectoris: Secondary | ICD-10-CM | POA: Diagnosis not present

## 2023-04-10 DIAGNOSIS — Z951 Presence of aortocoronary bypass graft: Secondary | ICD-10-CM | POA: Diagnosis not present

## 2023-04-10 LAB — GLUCOSE, CAPILLARY
Glucose-Capillary: 92 mg/dL (ref 70–99)
Glucose-Capillary: 117 mg/dL — ABNORMAL HIGH (ref 70–99)

## 2023-04-10 MED ORDER — DOCUSATE SODIUM 100 MG PO CAPS: 100.0000 mg | ORAL_CAPSULE | Freq: Two times a day (BID) | ORAL | 0 refills | Status: AC

## 2023-04-10 MED ORDER — POLYETHYLENE GLYCOL 3350 17 G PO PACK: 17.0000 g | PACK | Freq: Two times a day (BID) | ORAL | 0 refills | Status: AC

## 2023-04-10 MED ORDER — QUETIAPINE FUMARATE 25 MG PO TABS: 25.0000 mg | ORAL_TABLET | Freq: Every day | ORAL | 0 refills | Status: AC

## 2023-04-10 MED ORDER — SENNOSIDES-DOCUSATE SODIUM 8.6-50 MG PO TABS: 1.0000 | ORAL_TABLET | Freq: Every day | ORAL | 0 refills | Status: AC

## 2023-04-10 NOTE — TOC Progression Note (Addendum)
Transition of Care [facility]) - Progression Note    Patient Details  Name: Trevor Ward MRN: [id number] Date of Birth: 06-Sep-1941  Transition of Care [facility]) CM/SW Contact  Lorri Frederick, LCSW Phone Number: 04/10/2023, 8:44 AM  Clinical Narrative:   [facility] request submitted in [place] and approved: [id number], 3 days: 7/16-7/18.  MD informed.   1130: CSW confirmed with [facility] that [facility] can receive pt today.   Expected Discharge Plan: Skilled Nursing Facility Barriers to Discharge: Continued Medical Work up  Expected Discharge Plan and Services       Living arrangements for the past 2 months: Single Family Home                                       Social Determinants of Health (SDOH) Interventions SDOH Screenings   Food Insecurity: No Food Insecurity (04/04/2023)  Housing: Low Risk  (04/04/2023)  Transportation Needs: No Transportation Needs (04/04/2023)  Utilities: Not At Risk (04/04/2023)  Tobacco Use: Medium Risk (04/05/2023)    Readmission Risk Interventions     No data to display

## 2023-04-10 NOTE — TOC Transition Note (Signed)
Transition of Care [facility]) - CM/SW Discharge Note   Patient Details  Name: Trevor Ward MRN: [id number] Date of Birth: 05-25-1941  Transition of Care [facility]) CM/SW Contact:  Lorri Frederick, LCSW Phone Number: 04/10/2023, 11:42 AM   Clinical Narrative:   Pt discharging to [facility]. RN call report to [phone].     Final next level of care: Skilled Nursing Facility Barriers to Discharge: Barriers Resolved   Patient Goals and CMS Choice      Discharge Placement                Patient chooses bed at: [facility] [place] Patient to be transferred to facility by: [facility] Name of family member notified: wife [name] in room Patient and family notified of of transfer: 04/10/23  Discharge Plan and Services Additional resources added to the After Visit Summary for                                       Social Determinants of Health (SDOH) Interventions SDOH Screenings   Food Insecurity: No Food Insecurity (04/04/2023)  Housing: Low Risk  (04/04/2023)  Transportation Needs: No Transportation Needs (04/04/2023)  Utilities: Not At Risk (04/04/2023)  Tobacco Use: Medium Risk (04/05/2023)     Readmission Risk Interventions     No data to display

## 2023-04-10 NOTE — Progress Notes (Signed)
Pt report given to [place],receiving RN for [facility].Pt going to room 105.[facility] here to transport pt.

## 2023-04-10 NOTE — Progress Notes (Signed)
Occupational Therapy Treatment Patient Details Name: Trevor Ward MRN: [id number] DOB: [birth year] Today's Date: 04/10/2023   History of present illness 82 year old gentleman presents to ED 7/9 s/p fall at home. Found to have acute significantly displaced right hip femoral neck fracture s/p 7/11 right direct anterior hip replacement. NWG:[id number], CAD s/p CABG, PAD, AAA, gait disturbance, and mild dementia   OT comments  Patient believing he was in [facility] and that he was late for church. Patient oriented to place and time but difficulty recalling. Patient was max assist to get to EOB and unable to perform grooming tasks due to fearful on not holding on. Patient able to perform 2 stands from EOB with max assist for ~30 seconds and tolerated 10 minutes of sitting EOB. Patient was total assist to return to supine and instructed on UE HEP with yellow therapy band with wife present to increase carryover. Patient will benefit from continued inpatient follow up therapy, <3 hours/day to address sitting balance, transfers, and LB ADLs. Acute OT to continue to follow.    Recommendations for follow up therapy are one component of a multi-disciplinary discharge planning process, led by the attending physician.  Recommendations may be updated based on patient status, additional functional criteria and insurance authorization.    Assistance Recommended at Discharge Frequent or constant Supervision/Assistance  Patient can return home with the following  Two people to help with walking and/or transfers;A lot of help with bathing/dressing/bathroom;Direct supervision/assist for financial management;Direct supervision/assist for medications management;Assist for transportation;Assistance with cooking/housework;Help with stairs or ramp for entrance   Equipment Recommendations  None recommended by OT (defer to next level of care)    Recommendations for Other Services      Precautions / Restrictions  Precautions Precautions: Fall Precaution Comments: frequent falls Restrictions Weight Bearing Restrictions: No       Mobility Bed Mobility Overal bed mobility: Needs Assistance Bed Mobility: Supine to Sit, Sit to Supine     Supine to sit: Max assist Sit to supine: Total assist   General bed mobility comments: required assistance with LLE and scooting hips to EOB, assistance with trunk and LEs to return to supine    Transfers Overall transfer level: Needs assistance Equipment used: Rolling walker (2 wheels) Transfers: Sit to/from Stand Sit to Stand: From elevated surface, Max assist           General transfer comment: able to perform 2 stands from EOB with max assist and bed elevated. Patient tolerated ~30 seconds each stand     Balance Overall balance assessment: Needs assistance Sitting-balance support: Feet supported, Single extremity supported, Bilateral upper extremity supported Sitting balance-Leahy Scale: Poor Sitting balance - Comments: reliant on UE support Postural control: Posterior lean Standing balance support: Bilateral upper extremity supported Standing balance-Leahy Scale: Poor Standing balance comment: max assist for sitting balance due to posterior leaning                           ADL either performed or assessed with clinical judgement   ADL Overall ADL's : Needs assistance/impaired     Grooming: Wash/dry hands;Wash/dry face;Set up;Bed level Grooming Details (indicate cue type and reason): attempted grooming on EOB but patient had difficulty due to pain and using UEs to maintain balance  Extremity/Trunk Assessment              Vision       Perception     Praxis      Cognition Arousal/Alertness: Awake/alert Behavior During Therapy: WFL for tasks assessed/performed Overall Cognitive Status: Impaired/Different from baseline Area of Impairment: Following commands, Memory,  Orientation                 Orientation Level: Time, Disoriented to, Situation, Place Current Attention Level: Sustained Memory: Decreased short-term memory Following Commands: Follows one step commands inconsistently, Follows one step commands with increased time Safety/Judgement: Decreased awareness of safety   Problem Solving: Slow processing, Decreased initiation, Difficulty sequencing, Requires verbal cues General Comments: believed he was in [facility] and that he needed to get up for church        Exercises Exercises: General Upper Extremity General Exercises - Upper Extremity Shoulder Flexion: Strengthening, Both, 15 reps, Supine, Theraband Theraband Level (Shoulder Flexion): Level 1 (Yellow) Shoulder Horizontal ABduction: Strengthening, 10 reps, Supine, Theraband Theraband Level (Shoulder Horizontal Abduction): Level 1 (Yellow) Elbow Flexion: Strengthening, 15 reps, Supine, Theraband Theraband Level (Elbow Flexion): Level 1 (Yellow) Elbow Extension: Strengthening, Both, 15 reps, Supine, Theraband Theraband Level (Elbow Extension): Level 1 (Yellow)    Shoulder Instructions       General Comments      Pertinent Vitals/ Pain       Pain Assessment Pain Assessment: Faces Faces Pain Scale: Hurts whole lot Pain Location: R hip with movement Pain Descriptors / Indicators: Grimacing, Guarding, Moaning Pain Intervention(s): Limited activity within patient's tolerance, Monitored during session, Repositioned  Home Living                                          Prior Functioning/Environment              Frequency  Min 1X/week        Progress Toward Goals  OT Goals(current goals can now be found in the care plan section)  Progress towards OT goals: Progressing toward goals  Acute Rehab OT Goals Patient Stated Goal: none stated OT Goal Formulation: With patient Time For Goal Achievement: 04/21/23 Potential to Achieve Goals: Good ADL  Goals Pt Will Perform Grooming: sitting;with min guard assist Pt Will Perform Lower Body Bathing: sitting/lateral leans;with min assist Pt Will Perform Lower Body Dressing: sitting/lateral leans;with min assist Additional ADL Goal #1: Pt will tolerate 5 mins EOB sitting with Min guard in preparation for bedside ADLs  Plan Discharge plan remains appropriate    Co-evaluation                 AM-PAC OT "6 Clicks" Daily Activity     Outcome Measure   Help from another person eating meals?: None Help from another person taking care of personal grooming?: A Little Help from another person toileting, which includes using toliet, bedpan, or urinal?: Total Help from another person bathing (including washing, rinsing, drying)?: A Lot Help from another person to put on and taking off regular upper body clothing?: A Little Help from another person to put on and taking off regular lower body clothing?: Total 6 Click Score: 14    End of Session Equipment Utilized During Treatment: Gait belt;Rolling walker (2 wheels)  OT Visit Diagnosis: Unsteadiness on feet (R26.81);Other abnormalities of gait and mobility (R26.89);History of falling (Z91.81);Pain Pain - Right/Left: Right Pain -  part of body: Hip   Activity Tolerance Patient limited by pain   Patient Left in bed;with call bell/phone within reach;with family/visitor present   Nurse Communication Mobility status        Time: 2952-8413 OT Time Calculation (min): 24 min  Charges: OT General Charges $OT Visit: 1 Visit OT Treatments $Therapeutic Activity: 8-22 mins $Therapeutic Exercise: 8-22 mins  Alfonse Flavors, OTA Acute Rehabilitation Services  Office [phone]   Dewain Penning 04/10/2023, 10:03 AM

## 2023-04-10 NOTE — Progress Notes (Signed)
Patient ID: Trevor Ward, male   DOB: 1941-09-18, 82 y.o.   MRN: [id number] The patient is awake and alert and eating breakfast.  Family is at the bedside.  His vital signs are stable.  His right operative hip is stable.  Apparently he is waiting insurance authorization for short-term skilled nursing placement.  He is likely at his baseline in terms of his dementia.  From an orthopedic standpoint, he can be discharged to short-term skilled nursing.  We will see him in the office in 2 weeks.  I talked to family at length about the recovery process.  He went through the same situation when he fractured his left hip several years ago.  His right operative hip dressing is clean and dry.

## 2023-04-10 NOTE — Discharge Summary (Signed)
Physician Discharge Summary   Patient: Trevor Ward MRN: [id number] DOB: [birth year]  Admit date:     04/04/2023  Discharge date: 04/10/23  Discharge Physician: Rickey Barbara   PCP: Lupita Raider, MD   Recommendations at discharge:    Follow up with PCP in 1-2 weeks Follow up with Orthopedic Surgery as scheduled Please ensure patient ensures proper PO intake Ensure regular bowel movements, titrate cathartics as needed  Discharge Diagnoses: Principal Problem:   Closed displaced fracture of right femoral neck (HCC) Active Problems:   Fall at home, initial encounter   S/P CABG x 4 02/02/16   Essential hypertension   Gout   Dyslipidemia   AAA (abdominal aortic aneurysm) (HCC)  Resolved Problems:   * No resolved hospital problems. *  Hospital Course: 82 y.o. male with medical history significant of hypertension, CAD s/p CABG, PAD, AAA, gait disturbance, and mild dementia who presents after having a fall at home.  Patient lives at home with his wife and normally gets around without use of any assistive devices although wife makes note that he should.  He had been wearing socks on a slick floor when he had been down to do something with the dogs and fell.  Landed on his right hip and reported having immediate pain.  He was unable to bear weight or walk due to the pain.  Denied any loss of consciousness or trauma to his head.  He is wife makes note that he previously fractured with a left hip in 01/2022.    X-rays of the pelvis noted a acute mildly displaced right femoral neck fracture.  Orthopedics had been consulted.   Assessment and Plan: Right femoral neck fracture secondary to fall Patient presents recurrent fall at home.   X-rays reveal mildly displaced right femoral neck fracture.   -Orthopedic Surgery following. Pt is now s/p R anterior total hip arthoplasty 7/11 -cont analgesia as needed -TOC following, planning SNF placement   CAD Patient with prior history of  three-vessel CABG in 2017.  EKG revealed normal sinus rhythm. -Continue aspirin, beta-blocker, and Crestor   Essential hypertension Patient had only been taking metoprolol tartrate 25 mg nightly instead of twice daily as prescribed. BP remains stable and controlled    History of gout -Continue allopurinol   Dyslipidemia -Continue Crestor   AAA Patient noted to have ectasia bordering on mild aneurysmal dilation of the ascending thoracic aorta with a maximum diameter 3.9 cm last checked in 06/2021. -Recommend repeat imaging in outpatient setting   Dementia -improved after addition of seroquel. Pt now sleeping better -cont supportive care   ARF -Cr improved with basal IVF      Consultants: Orthopedic Surgery Procedures performed: R hip arthoplasty 7/11  Disposition: Skilled nursing facility Diet recommendation:  Regular diet DISCHARGE MEDICATION: Allergies as of 04/10/2023       Reactions   Statins Other (See Comments)   Severe myalgias   Ativan [lorazepam] Other (See Comments)   Wife states patient was too weak to walk after taking this.    Mirtazapine Other (See Comments)   Felt bad        Medication List     STOP taking these medications    aspirin EC 81 MG tablet Replaced by: aspirin 81 MG chewable tablet       TAKE these medications    acetaminophen 650 MG CR tablet Commonly known as: TYLENOL Take 1,300 mg by mouth as needed for pain.   allopurinol 100 MG tablet Commonly  known as: ZYLOPRIM Take 100 mg by mouth daily.   amitriptyline 25 MG tablet Commonly known as: ELAVIL Take 25 mg by mouth at bedtime.   aspirin 81 MG chewable tablet Chew 1 tablet (81 mg total) by mouth 2 (two) times daily. Replaces: aspirin EC 81 MG tablet   buPROPion 300 MG 24 hr tablet Commonly known as: WELLBUTRIN XL Take 300 mg by mouth daily.   colchicine 0.6 MG tablet Take 0.6-1.2 mg by mouth daily as needed (gout).   docusate sodium 100 MG capsule Commonly  known as: COLACE Take 1 capsule (100 mg total) by mouth 2 (two) times daily.   Glucosamine Sulfate 1000 MG Tabs Take 1,000 mg by mouth in the morning and at bedtime.   HYDROcodone-acetaminophen 5-325 MG tablet Commonly known as: NORCO/VICODIN Take 1-2 tablets by mouth every 6 (six) hours as needed for moderate pain.   meclizine 25 MG tablet Commonly known as: ANTIVERT Take 25 mg by mouth as needed for dizziness or nausea.   metoprolol tartrate 25 MG tablet Commonly known as: LOPRESSOR TAKE 1 TABLET TWO TIMES DAILY. MAKE OFFICE VISIT FOR MORE REFILLS. What changed: See the new instructions.   pantoprazole 40 MG tablet Commonly known as: PROTONIX Take 1 tablet (40 mg total) by mouth daily. What changed: when to take this   polyethylene glycol 17 g packet Commonly known as: MIRALAX / GLYCOLAX Take 17 g by mouth 2 (two) times daily.   QUEtiapine 25 MG tablet Commonly known as: SEROQUEL Take 1 tablet (25 mg total) by mouth at bedtime.   rosuvastatin 10 MG tablet Commonly known as: CRESTOR Take 10 mg by mouth at bedtime.   senna-docusate 8.6-50 MG tablet Commonly known as: Senokot-S Take 1 tablet by mouth at bedtime.   Voltaren 1 % Gel Generic drug: diclofenac Sodium Apply 1 Application topically 3 (three) times daily as needed (pain).               Durable Medical Equipment  (From admission, onward)           Start     Ordered   04/05/23 1329  DME 3 n 1  Once        04/05/23 1328   04/05/23 1329  DME Walker rolling  Once       Question Answer Comment  Walker: With 5 Inch Wheels   Patient needs a walker to treat with the following condition Status post total replacement of right hip      04/05/23 1328            Contact information for follow-up providers     Lupita Raider, MD Follow up.   Specialty: Family Medicine Contact information: [address] [place] [address] [phone]         [facility] Follow up.   Why: Home health PT services will be provided by [facility], start of care within 48 hours post discharge Contact information: [address] [place] [address] [phone]         [name], MD. Schedule an appointment as soon as possible for a visit in 2 week(s).   Specialty: Orthopedic Surgery Contact information: [address] [place] [address] [phone]              Contact information for after-discharge care     Destination     [facility] [place] [facility]erred [facility] .   Service: Skilled  Nursing Contact information: [address] [place] [address] [phone]                    Discharge Exam: Ceasar Mons Weights   04/04/23 0940 04/05/23 1020  Weight: 90.7 kg 90 kg   General exam: Awake, laying in bed, in nad Respiratory system: Normal respiratory effort, no wheezing Cardiovascular system: regular rate, s1, s2 Gastrointestinal system: Soft, nondistended, positive BS Central nervous system: CN2-12 grossly intact, strength intact Extremities: Perfused, no clubbing Skin: Normal skin turgor, no notable skin lesions seen Psychiatry: Mood normal // no visual hallucinations   Condition at discharge: fair  The results of significant diagnostics from this hospitalization (including imaging, microbiology, ancillary and laboratory) are listed below for reference.   Imaging Studies: DG HIP UNILAT WITH PELVIS 2-3 VIEWS RIGHT  Result Date: 04/05/2023 CLINICAL DATA:  Elective surgery. EXAM: DG HIP (WITH OR WITHOUT PELVIS) 2-3V RIGHT COMPARISON:  Preoperative radiograph FINDINGS: Three fluoroscopic spot views of the pelvis and right hip obtained in the operating room. Images during hip arthroplasty. Fluoroscopy time 27 seconds. Dose 1.84 mGy. IMPRESSION: Intraoperative fluoroscopy for right hip arthroplasty. Electronically Signed   By: Narda Rutherford M.D.   On: 04/05/2023  14:03   DG Pelvis Portable  Result Date: 04/05/2023 CLINICAL DATA:  Status post total hip replacement, right. EXAM: PORTABLE PELVIS 1-2 VIEWS COMPARISON:  Preoperative radiograph FINDINGS: Right hip arthroplasty in expected alignment. No periprosthetic lucency or fracture. Recent postsurgical change includes air and edema in the soft tissues. Lateral skin staples in place. Previous left hip arthroplasty. IMPRESSION: Right hip arthroplasty without immediate postoperative complication. Electronically Signed   By: Narda Rutherford M.D.   On: 04/05/2023 14:02   DG C-Arm 1-60 Min-No Report  Result Date: 04/05/2023 Fluoroscopy was utilized by the requesting physician.  No radiographic interpretation.   DG Chest 1 View  Result Date: 04/04/2023 CLINICAL DATA:  fracture EXAM: CHEST  1 VIEW COMPARISON:  March 18, 2019. FINDINGS: The heart size and mediastinal contours are within normal limits. Both lungs are clear. No visible pleural effusions or pneumothorax. No acute osseous abnormality. Chronic calcified pleural plaques bilaterally. IMPRESSION: No active disease.  Calcified pleural plaques. Electronically Signed   By: Feliberto Harts M.D.   On: 04/04/2023 11:10   DG Hip Unilat With Pelvis 2-3 Views Right  Result Date: 04/04/2023 CLINICAL DATA:  Fall, right hip pain. EXAM: DG HIP (WITH OR WITHOUT PELVIS) 2-3V RIGHT COMPARISON:  None Available. FINDINGS: There is an acute fracture through the right femoral neck with mild proximal displacement. Femoroacetabular alignment is maintained. The patient is status post left hip arthroplasty. There is no perihardware fracture to the level imaged. The SI joints and symphysis pubis are intact. IMPRESSION: Acute mildly displaced right femoral neck fracture. Electronically Signed   By: Lesia Hausen M.D.   On: 04/04/2023 11:10    Microbiology: Results for orders placed or performed during the hospital encounter of 04/04/23  MRSA Next Gen by PCR, Nasal     Status: None    Collection Time: 04/04/23  5:33 PM   Specimen: Nasal Mucosa; Nasal Swab  Result Value Ref Range Status   MRSA by PCR Next Gen NOT DETECTED NOT DETECTED Final    Comment: (NOTE) The GeneXpert MRSA Assay (FDA approved for NASAL specimens only), is one component of a comprehensive MRSA colonization surveillance program. It is not intended to diagnose MRSA infection nor to guide or monitor treatment for MRSA infections. Test performance is  not FDA approved in patients less than 94 years old. Performed at [facility], [address] [place], [place] [address]     Labs: CBC: Recent Labs  Lab 04/04/23 0945 04/05/23 6045 04/06/23 0237 04/07/23 0337 04/08/23 0304 04/09/23 0454  WBC 10.5 12.2* 11.1* 9.9 9.6 8.0  NEUTROABS 9.0*  --   --   --   --   --   HGB 17.3* 15.1 14.5 13.5 13.4 12.9*  HCT 50.4 44.5 43.1 39.8 40.0 38.5*  MCV 87.8 86.4 88.0 86.7 86.6 87.7  PLT 203 221 186 170 184 193   Basic Metabolic Panel: Recent Labs  Lab 04/05/23 0337 04/06/23 0237 04/07/23 0337 04/08/23 0304 04/09/23 0454  NA 133* 134* 133* 130* 133*  K 4.6 4.0 4.2 4.3 3.8  CL 103 102 101 101 100  CO2 21* 24 23 21* 26  GLUCOSE 161* 128* 93 142* 121*  BUN 15 25* 22 24* 25*  CREATININE 1.25* 1.41* 1.30* 1.13 1.04  CALCIUM 8.8* 8.3* 8.1* 8.1* 8.2*   Liver Function Tests: Recent Labs  Lab 04/04/23 0945 04/06/23 0237 04/07/23 0337 04/08/23 0304 04/09/23 0454  AST 19 15 17 17 28   ALT 16 14 11 13 22   ALKPHOS 68 50 44 49 60  BILITOT 0.9 0.5 0.9 1.5* 1.0  PROT 7.5 6.4* 5.8* 6.0* 5.9*  ALBUMIN 4.0 3.2* 2.7* 2.6* 2.4*   CBG: Recent Labs  Lab 04/08/23 1933 04/09/23 0742 04/09/23 1610 04/09/23 1945 04/10/23 0714  GLUCAP 131* 107* 136* 105* 92    Discharge time spent: less than 30 minutes.  Signed: Rickey Barbara, MD [facility] 04/10/2023

## 2023-04-11 DIAGNOSIS — R413 Other amnesia: Secondary | ICD-10-CM | POA: Diagnosis not present

## 2023-04-11 DIAGNOSIS — I25810 Atherosclerosis of coronary artery bypass graft(s) without angina pectoris: Secondary | ICD-10-CM | POA: Diagnosis not present

## 2023-04-11 DIAGNOSIS — F0390 Unspecified dementia without behavioral disturbance: Secondary | ICD-10-CM | POA: Diagnosis not present

## 2023-04-11 DIAGNOSIS — I719 Aortic aneurysm of unspecified site, without rupture: Secondary | ICD-10-CM | POA: Diagnosis not present

## 2023-04-12 LAB — GLUCOSE, CAPILLARY: Glucose-Capillary: 170 mg/dL — ABNORMAL HIGH (ref 70–99)

## 2023-04-19 ENCOUNTER — Ambulatory Visit (INDEPENDENT_AMBULATORY_CARE_PROVIDER_SITE_OTHER): Payer: Medicare HMO | Admitting: Orthopaedic Surgery

## 2023-04-19 DIAGNOSIS — Z96641 Presence of right artificial hip joint: Secondary | ICD-10-CM

## 2023-04-19 NOTE — Progress Notes (Signed)
The patient is here today for his first postoperative visit 2 weeks after a right displaced femoral neck fracture in which we placed a total hip arthroplasty.  He had fallen 2 years earlier and had a left total hip arthroplasty.  He is convalescing in a skilled nursing facility.  He is 82 years old.  He does have some dementia.  Family is with him today.  He is able to get up onto the exam table easily and back into his wheelchair.  His hip incision looks good and the staples are removed and Steri-Strips applied.  His leg lengths are equal.  We did not x-ray him today.  He will still go slow mobility with only getting up with a walker.  From my standpoint I will see him back in 6 weeks.  We do want a standing AP pelvis at that visit close will likely release him after that.  All question concerns were answered and addressed.

## 2023-05-01 DIAGNOSIS — F0390 Unspecified dementia without behavioral disturbance: Secondary | ICD-10-CM | POA: Diagnosis not present

## 2023-05-01 DIAGNOSIS — K219 Gastro-esophageal reflux disease without esophagitis: Secondary | ICD-10-CM | POA: Diagnosis not present

## 2023-05-01 DIAGNOSIS — I119 Hypertensive heart disease without heart failure: Secondary | ICD-10-CM | POA: Diagnosis not present

## 2023-05-01 DIAGNOSIS — Z9989 Dependence on other enabling machines and devices: Secondary | ICD-10-CM | POA: Diagnosis not present

## 2023-05-01 DIAGNOSIS — E871 Hypo-osmolality and hyponatremia: Secondary | ICD-10-CM | POA: Diagnosis not present

## 2023-05-01 DIAGNOSIS — M8000XA Age-related osteoporosis with current pathological fracture, unspecified site, initial encounter for fracture: Secondary | ICD-10-CM | POA: Diagnosis not present

## 2023-05-16 ENCOUNTER — Other Ambulatory Visit: Payer: Self-pay | Admitting: Family Medicine

## 2023-05-16 DIAGNOSIS — M25531 Pain in right wrist: Secondary | ICD-10-CM | POA: Diagnosis not present

## 2023-05-16 DIAGNOSIS — R410 Disorientation, unspecified: Secondary | ICD-10-CM | POA: Diagnosis not present

## 2023-05-16 DIAGNOSIS — M79641 Pain in right hand: Secondary | ICD-10-CM | POA: Diagnosis not present

## 2023-05-16 DIAGNOSIS — R4182 Altered mental status, unspecified: Secondary | ICD-10-CM | POA: Diagnosis not present

## 2023-05-16 DIAGNOSIS — Z9181 History of falling: Secondary | ICD-10-CM | POA: Diagnosis not present

## 2023-05-17 ENCOUNTER — Ambulatory Visit
Admission: RE | Admit: 2023-05-17 | Discharge: 2023-05-17 | Disposition: A | Discharge status: Home / Self Care | Payer: Medicare HMO | Source: Ambulatory Visit | Attending: Family Medicine | Admitting: Family Medicine

## 2023-05-17 ENCOUNTER — Other Ambulatory Visit: Payer: Self-pay | Admitting: Family Medicine

## 2023-05-17 ENCOUNTER — Encounter: Payer: Self-pay | Admitting: Family Medicine

## 2023-05-17 DIAGNOSIS — M25531 Pain in right wrist: Secondary | ICD-10-CM

## 2023-05-17 DIAGNOSIS — R4182 Altered mental status, unspecified: Secondary | ICD-10-CM

## 2023-05-17 DIAGNOSIS — M19041 Primary osteoarthritis, right hand: Secondary | ICD-10-CM | POA: Diagnosis not present

## 2023-05-17 DIAGNOSIS — M79641 Pain in right hand: Secondary | ICD-10-CM

## 2023-05-17 DIAGNOSIS — R0609 Other forms of dyspnea: Secondary | ICD-10-CM | POA: Diagnosis not present

## 2023-05-20 IMAGING — CT CT ANGIO CHEST
3 of 8 series · 18 of 46 positions shown · IV contrast (omnipaque)
Comparison: Prior CTA chest 06/24/2020

CLINICAL DATA: Thoracic aortic aneurysm follow-up

EXAM:
CT ANGIOGRAPHY CHEST WITH CONTRAST
TECHNIQUE: Multidetector CT imaging of the chest was performed using the
standard protocol during bolus administration of intravenous
contrast. Multiplanar CT image reconstructions and MIPs were
obtained to evaluate the vascular anatomy.
CONTRAST:  100mL OMNIPAQUE IOHEXOL 350 MG/ML SOLN

[Series 4: aorta 3.0 bf37 2 · axial · 0.86mm/px · z∈[-318,-54]mm · 12 of 105 slices shown]
[im 9/105  lung]
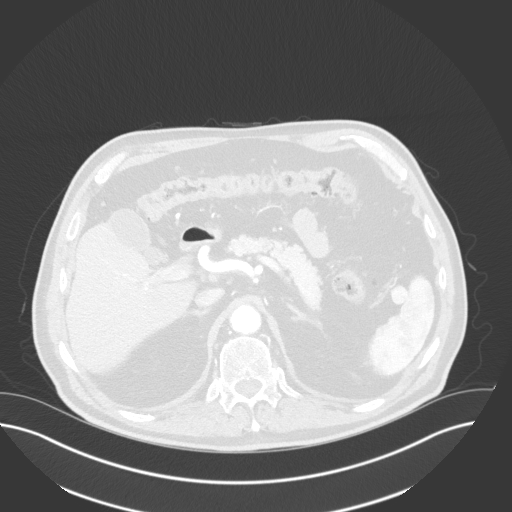
[im 17/105  soft-tissue]
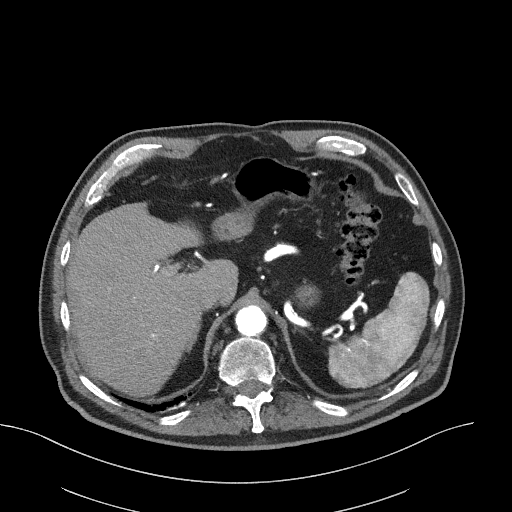
[im 25/105  lung]
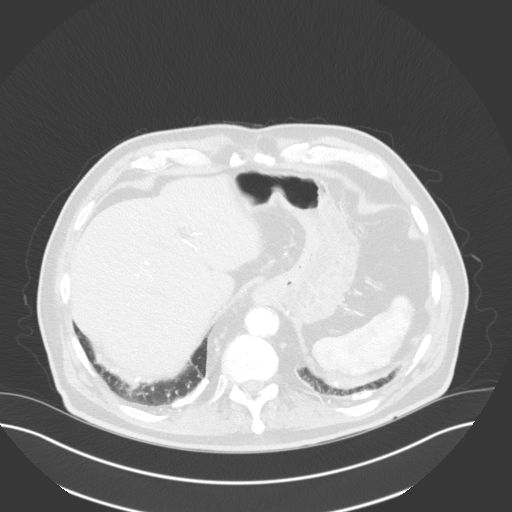
[im 33/105  soft-tissue]
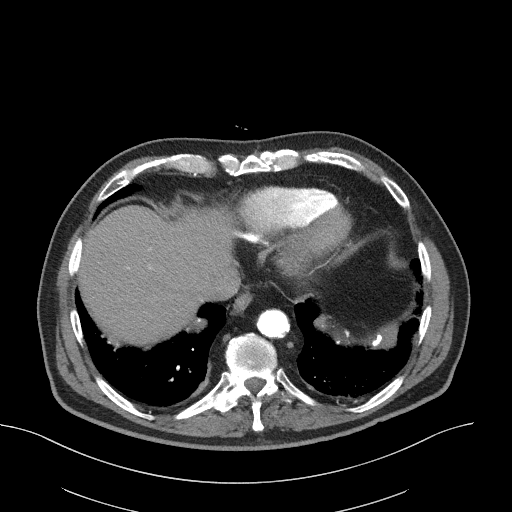
[im 41/105  lung]
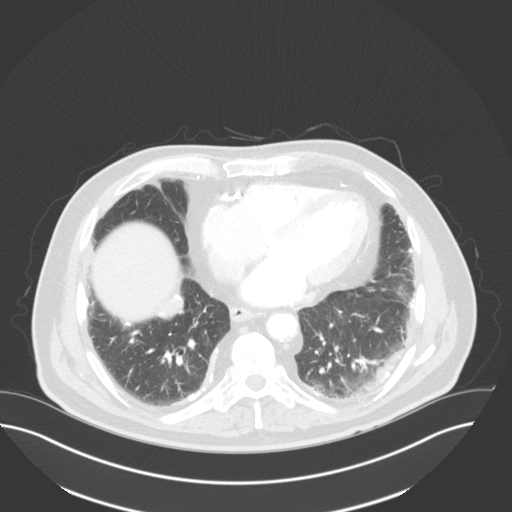
[im 49/105  soft-tissue]
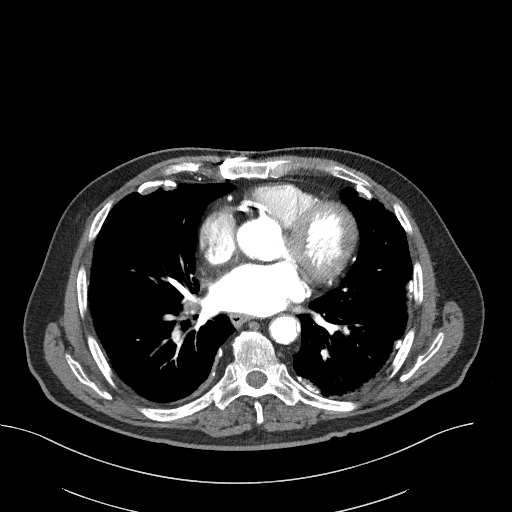
[im 57/105  lung]
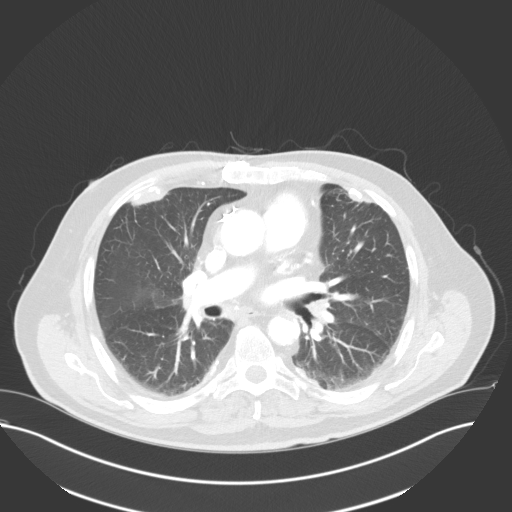
[im 65/105  soft-tissue]
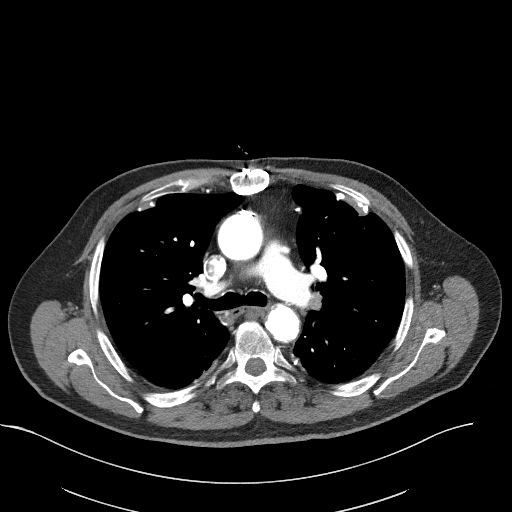
[im 73/105  lung]
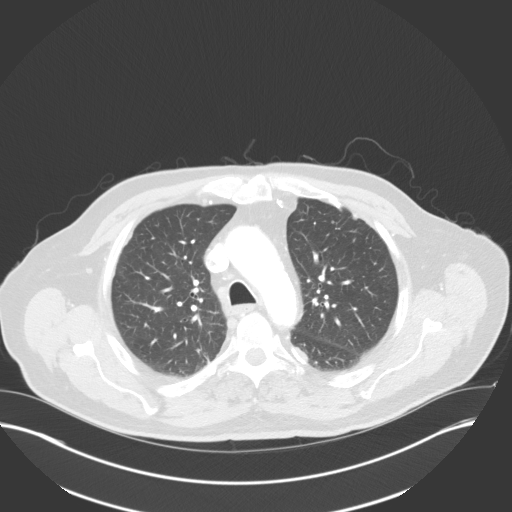
[im 81/105  soft-tissue]
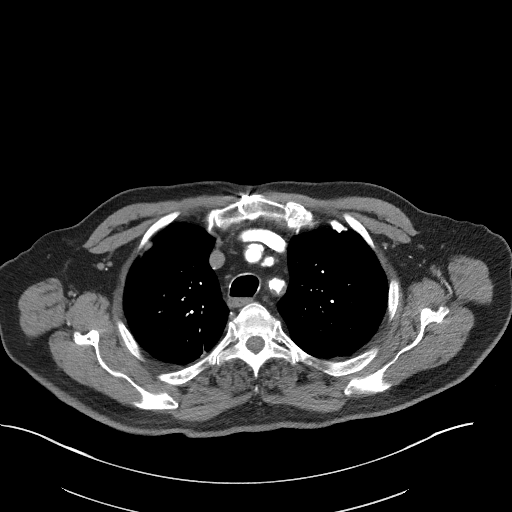
[im 89/105  lung]
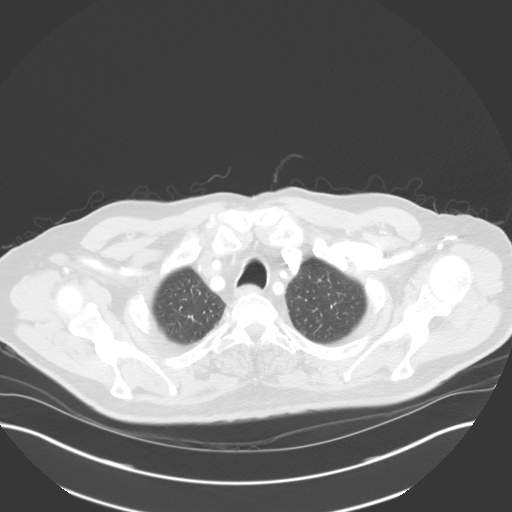
[im 97/105  soft-tissue]
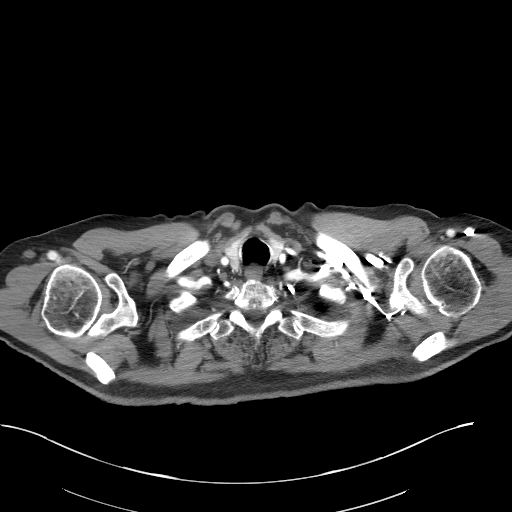

[Series 5: lung · axial · 0.86mm/px · z∈[-320,-230]mm · 3 of 105 slices shown]
[im 8/105  soft-tissue]
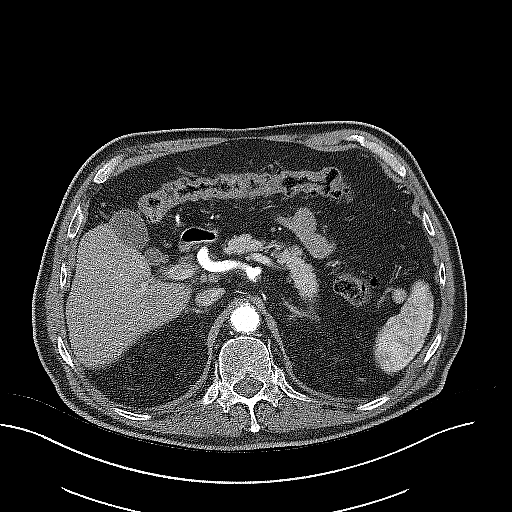
[im 23/105  soft-tissue]
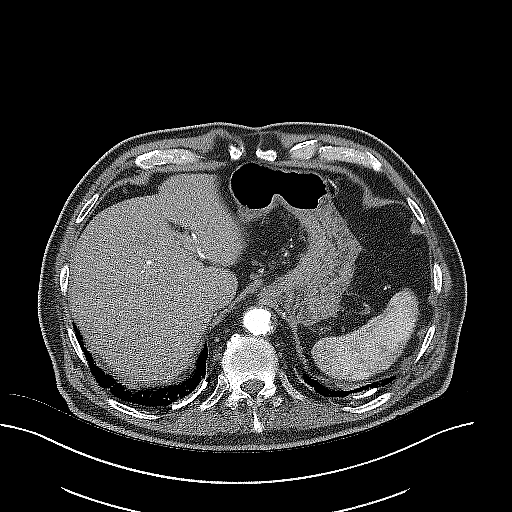
[im 38/105  soft-tissue]
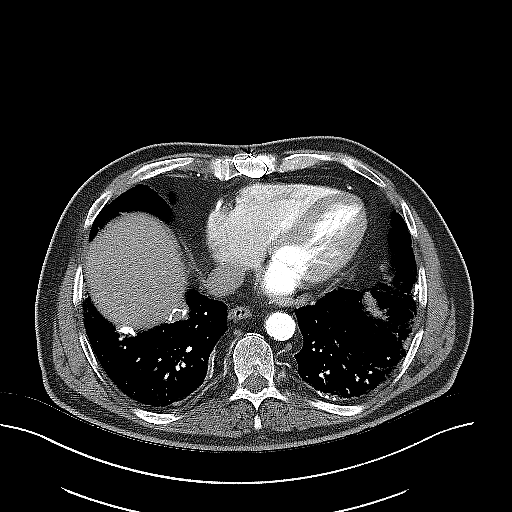

[Series 7: coronals · coronal · 0.62mm/px · 3 of 125 slices shown]
[im 32/125  soft-tissue]
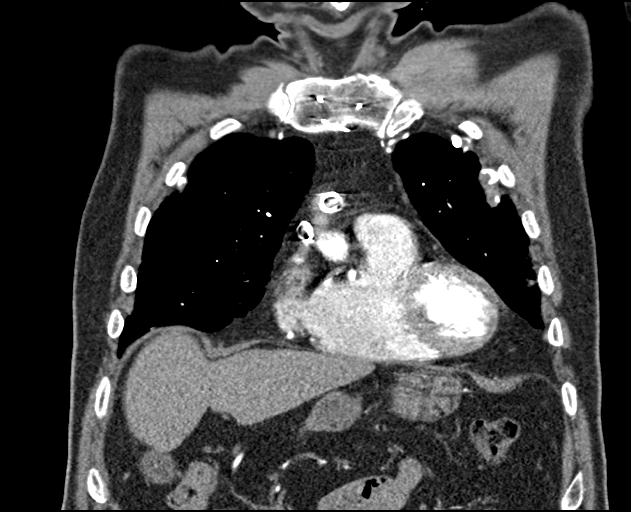
[im 63/125  soft-tissue]
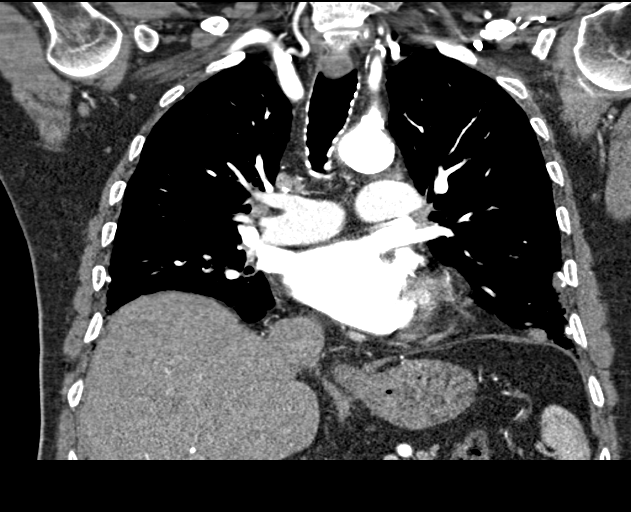
[im 94/125  soft-tissue]
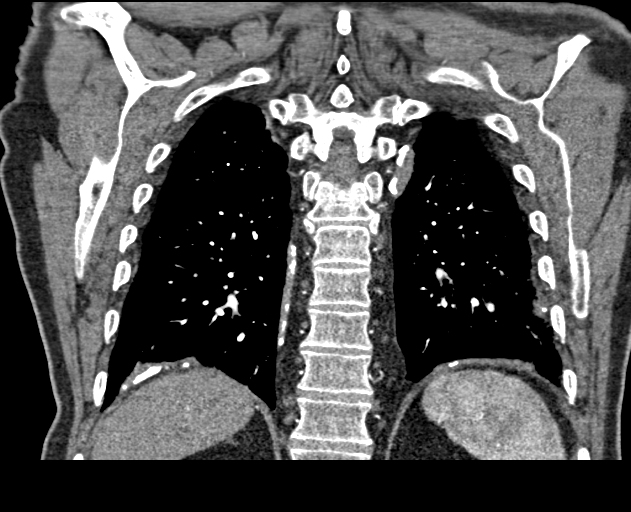

[18 of 46 positions shown; findings below may reference images not displayed]

FINDINGS: Cardiovascular: Ectatic ascending thoracic aorta with a maximal
diameter of 3.9 cm. No evidence of dissection. Patient is status
post median sternotomy with prior multivessel CABG. Thickened and
calcified aortic valve again noted. Diffuse calcifications
throughout the native coronary arteries. The heart is normal in
size. No pericardial effusion.

Mediastinum/Nodes: Unremarkable CT appearance of the thyroid gland.
No suspicious mediastinal or hilar adenopathy. No soft tissue
mediastinal mass. Small hiatal hernia.

Lungs/Pleura: Multifocal calcified pleural plaques bilaterally
suggest prior asbestos exposure. No suspicious pulmonary mass or
nodule. Mild dependent atelectasis bilaterally.

Upper Abdomen: No acute abnormality in the visualized upper abdomen.
Cholelithiasis noted incidentally.

Musculoskeletal: No acute fracture or aggressive appearing lytic or
blastic osseous lesion.

Review of the MIP images confirms the above findings.
IMPRESSION: 1. Stable ectasia bordering on mild aneurysmal dilation of the
ascending thoracic aorta with a maximal diameter of 3.9 cm.
2. Thickened and calcified aortic valve.
3. Aortic and coronary artery atherosclerotic disease.
4. Small hiatal hernia.
5. Stable multifocal calcified pleural plaques bilaterally
suggesting prior asbestos exposure.
6. Cholelithiasis.

Aortic aneurysm NOS (IC33K-F5L.3); Aortic Atherosclerosis
(IC33K-FLD.D).

## 2023-05-22 ENCOUNTER — Ambulatory Visit
Admission: RE | Admit: 2023-05-22 | Discharge: 2023-05-22 | Disposition: A | Discharge status: Home / Self Care | Payer: Medicare HMO | Source: Ambulatory Visit | Attending: Family Medicine | Admitting: Family Medicine

## 2023-05-22 DIAGNOSIS — R410 Disorientation, unspecified: Secondary | ICD-10-CM

## 2023-05-22 DIAGNOSIS — R296 Repeated falls: Secondary | ICD-10-CM | POA: Diagnosis not present

## 2023-06-01 ENCOUNTER — Emergency Department (HOSPITAL_COMMUNITY): Payer: Medicare HMO

## 2023-06-01 ENCOUNTER — Emergency Department (HOSPITAL_COMMUNITY)
Admission: EM | Admit: 2023-06-01 | Discharge: 2023-06-04 | Disposition: A | Discharge status: Home / Self Care | Payer: Medicare HMO

## 2023-06-01 ENCOUNTER — Other Ambulatory Visit: Payer: Self-pay

## 2023-06-01 ENCOUNTER — Encounter (HOSPITAL_COMMUNITY): Payer: Self-pay

## 2023-06-01 DIAGNOSIS — I2510 Atherosclerotic heart disease of native coronary artery without angina pectoris: Secondary | ICD-10-CM | POA: Diagnosis not present

## 2023-06-01 DIAGNOSIS — F0390 Unspecified dementia without behavioral disturbance: Secondary | ICD-10-CM | POA: Diagnosis not present

## 2023-06-01 DIAGNOSIS — I700 Atherosclerosis of aorta: Secondary | ICD-10-CM | POA: Diagnosis not present

## 2023-06-01 DIAGNOSIS — Z8673 Personal history of transient ischemic attack (TIA), and cerebral infarction without residual deficits: Secondary | ICD-10-CM | POA: Insufficient documentation

## 2023-06-01 DIAGNOSIS — M25552 Pain in left hip: Secondary | ICD-10-CM | POA: Insufficient documentation

## 2023-06-01 DIAGNOSIS — M25551 Pain in right hip: Secondary | ICD-10-CM | POA: Diagnosis not present

## 2023-06-01 DIAGNOSIS — R296 Repeated falls: Secondary | ICD-10-CM | POA: Insufficient documentation

## 2023-06-01 DIAGNOSIS — R441 Visual hallucinations: Secondary | ICD-10-CM | POA: Diagnosis not present

## 2023-06-01 DIAGNOSIS — S0990XA Unspecified injury of head, initial encounter: Secondary | ICD-10-CM | POA: Diagnosis not present

## 2023-06-01 DIAGNOSIS — R410 Disorientation, unspecified: Secondary | ICD-10-CM | POA: Diagnosis not present

## 2023-06-01 DIAGNOSIS — Z7982 Long term (current) use of aspirin: Secondary | ICD-10-CM | POA: Diagnosis not present

## 2023-06-01 DIAGNOSIS — M25559 Pain in unspecified hip: Secondary | ICD-10-CM | POA: Diagnosis present

## 2023-06-01 DIAGNOSIS — I10 Essential (primary) hypertension: Secondary | ICD-10-CM | POA: Diagnosis not present

## 2023-06-01 DIAGNOSIS — Z043 Encounter for examination and observation following other accident: Secondary | ICD-10-CM | POA: Diagnosis not present

## 2023-06-01 DIAGNOSIS — J929 Pleural plaque without asbestos: Secondary | ICD-10-CM | POA: Diagnosis not present

## 2023-06-01 DIAGNOSIS — S199XXA Unspecified injury of neck, initial encounter: Secondary | ICD-10-CM | POA: Diagnosis not present

## 2023-06-01 LAB — COMPREHENSIVE METABOLIC PANEL
ALT: 10 U/L (ref 0–44)
AST: 15 U/L (ref 15–41)
Albumin: 3.4 g/dL — ABNORMAL LOW (ref 3.5–5.0)
Alkaline Phosphatase: 72 U/L (ref 38–126)
Anion gap: 8 (ref 5–15)
BUN: 15 mg/dL (ref 8–23)
CO2: 26 mmol/L (ref 22–32)
Calcium: 8.6 mg/dL — ABNORMAL LOW (ref 8.9–10.3)
Chloride: 103 mmol/L (ref 98–111)
Creatinine, Ser: 1.2 mg/dL (ref 0.61–1.24)
GFR, Estimated: 60 mL/min — ABNORMAL LOW (ref >60)
Glucose, Bld: 82 mg/dL (ref 70–99)
Potassium: 4 mmol/L (ref 3.5–5.1)
Sodium: 137 mmol/L (ref 135–145)
Total Bilirubin: 0.9 mg/dL (ref 0.3–1.2)
Total Protein: 6.6 g/dL (ref 6.5–8.1)

## 2023-06-01 LAB — URINALYSIS, ROUTINE W REFLEX MICROSCOPIC
Bilirubin Urine: NEGATIVE
Glucose, UA: NEGATIVE mg/dL
Hgb urine dipstick: NEGATIVE
Ketones, ur: NEGATIVE mg/dL
Leukocytes,Ua: NEGATIVE
Nitrite: NEGATIVE
Protein, ur: NEGATIVE mg/dL
Specific Gravity, Urine: 1.017 (ref 1.005–1.030)
pH: 5 (ref 5.0–8.0)

## 2023-06-01 LAB — CBC WITH DIFFERENTIAL/PLATELET
Abs Immature Granulocytes: 0.02 10*3/uL (ref 0.00–0.07)
Basophils Absolute: 0 10*3/uL (ref 0.0–0.1)
Basophils Relative: 0 %
Eosinophils Absolute: 0.2 10*3/uL (ref 0.0–0.5)
Eosinophils Relative: 3 %
HCT: 41.2 % (ref 39.0–52.0)
Hemoglobin: 13.9 g/dL (ref 13.0–17.0)
Immature Granulocytes: 0 %
Lymphocytes Relative: 26 %
Lymphs Abs: 1.8 10*3/uL (ref 0.7–4.0)
MCH: 29.6 pg (ref 26.0–34.0)
MCHC: 33.7 g/dL (ref 30.0–36.0)
MCV: 87.8 fL (ref 80.0–100.0)
Monocytes Absolute: 0.5 10*3/uL (ref 0.1–1.0)
Monocytes Relative: 7 %
Neutro Abs: 4.6 10*3/uL (ref 1.7–7.7)
Neutrophils Relative %: 64 %
Platelets: 251 10*3/uL (ref 150–400)
RBC: 4.69 MIL/uL (ref 4.22–5.81)
RDW: 14.2 % (ref 11.5–15.5)
WBC: 7.2 10*3/uL (ref 4.0–10.5)
nRBC: 0 % (ref 0.0–0.2)

## 2023-06-01 MED ORDER — METOPROLOL TARTRATE 25 MG PO TABS
25.0000 mg | ORAL_TABLET | Freq: Every day | ORAL | Status: DC
  Administered 2023-06-01 – 2023-06-03 (×3): 25 mg via ORAL
  Filled 2023-06-01 (×3): qty 1

## 2023-06-01 MED ORDER — OLANZAPINE 10 MG PO TABS
5.0000 mg | ORAL_TABLET | Freq: Once | ORAL | Status: AC
  Administered 2023-06-01: 5 mg via ORAL
  Filled 2023-06-01: qty 1

## 2023-06-01 MED ORDER — ASPIRIN 81 MG PO CHEW
81.0000 mg | CHEWABLE_TABLET | Freq: Two times a day (BID) | ORAL | Status: DC
  Administered 2023-06-01 – 2023-06-04 (×6): 81 mg via ORAL
  Filled 2023-06-01 (×6): qty 1

## 2023-06-01 MED ORDER — QUETIAPINE FUMARATE 25 MG PO TABS
25.0000 mg | ORAL_TABLET | Freq: Every day | ORAL | Status: DC
  Administered 2023-06-01 – 2023-06-03 (×3): 25 mg via ORAL
  Filled 2023-06-01 (×3): qty 1

## 2023-06-01 MED ORDER — POLYETHYLENE GLYCOL 3350 17 G PO PACK
17.0000 g | PACK | Freq: Two times a day (BID) | ORAL | Status: DC
  Administered 2023-06-01 – 2023-06-04 (×5): 17 g via ORAL
  Filled 2023-06-01 (×6): qty 1

## 2023-06-01 MED ORDER — SENNOSIDES-DOCUSATE SODIUM 8.6-50 MG PO TABS
1.0000 | ORAL_TABLET | Freq: Every day | ORAL | Status: DC
  Administered 2023-06-01 – 2023-06-03 (×3): 1 via ORAL
  Filled 2023-06-01 (×3): qty 1

## 2023-06-01 MED ORDER — PANTOPRAZOLE SODIUM 40 MG PO TBEC
40.0000 mg | DELAYED_RELEASE_TABLET | Freq: Every day | ORAL | Status: DC
  Administered 2023-06-01 – 2023-06-04 (×4): 40 mg via ORAL
  Filled 2023-06-01 (×4): qty 1

## 2023-06-01 MED ORDER — ROSUVASTATIN CALCIUM 5 MG PO TABS
5.0000 mg | ORAL_TABLET | Freq: Every day | ORAL | Status: DC
  Administered 2023-06-01 – 2023-06-03 (×3): 5 mg via ORAL
  Filled 2023-06-01 (×3): qty 1

## 2023-06-01 NOTE — ED Provider Notes (Signed)
[place] EMERGENCY DEPARTMENT AT [facility] Provider Note   CSN: [id number] Arrival date & time: 06/01/23  1126     History  Chief Complaint  Patient presents with   Hip Pain    Trevor Ward is a 82 y.o. male.  Patient is an 82 year old male with suspected mild dementia, CAD, prior CVA without residual deficits, bilateral hip fractures status post repair presenting to the emergency department with his daughter with concern for altered mental status and frequent falls.  [name] daughter states that his last hip fracture was in July and after coming home from rehab he was initially doing well walking with the walker.  She states however over the last few weeks he has had frequent falls, often multiple times a day.  Most recently fell twice yesterday and hit his head at least one of the times.  She denies any loss of consciousness.  She states that he has had increasing insomnia and wandering throughout the night for example last weekend she put him to bed and that done and found him in the morning on the porch and only his underwear.  She states that he has seen a neurologist previously but has never been formally diagnosed with dementia.  She states that he has had a decreased appetite but is still eating and denies any nausea, vomiting or diarrhea or recent fever.  She states that she feels unable to care for him herself at home.  The history is provided by a relative. The history is limited by the condition of the patient.  Hip Pain       Home Medications Prior to Admission medications   Medication Sig Start Date End Date Taking? Authorizing Provider  acetaminophen (TYLENOL) 650 MG CR tablet Take 1,300 mg by mouth as needed for pain.    [provider]  allopurinol (ZYLOPRIM) 100 MG tablet Take 100 mg by mouth daily.  01/13/16   [provider]  amitriptyline (ELAVIL) 25 MG tablet Take 25 mg by mouth at bedtime. 04/19/19   [provider]   aspirin 81 MG chewable tablet Chew 1 tablet (81 mg total) by mouth 2 (two) times daily. 04/07/23   [name], MD  buPROPion (WELLBUTRIN XL) 300 MG 24 hr tablet Take 300 mg by mouth daily. 05/19/19   [provider]  colchicine 0.6 MG tablet Take 0.6-1.2 mg by mouth daily as needed (gout). 03/27/16   [provider]  diclofenac Sodium (VOLTAREN) 1 % GEL Apply 1 Application topically 3 (three) times daily as needed (pain).    [provider]  docusate sodium (COLACE) 100 MG capsule Take 1 capsule (100 mg total) by mouth 2 (two) times daily. 04/10/23   Jerald Kief, MD  Glucosamine Sulfate 1000 MG TABS Take 1,000 mg by mouth in the morning and at bedtime.    [provider]  HYDROcodone-acetaminophen (NORCO/VICODIN) 5-325 MG tablet Take 1-2 tablets by mouth every 6 (six) hours as needed for moderate pain. 04/07/23   [name], MD  meclizine (ANTIVERT) 25 MG tablet Take 25 mg by mouth as needed for dizziness or nausea. 02/05/18   [provider]  metoprolol tartrate (LOPRESSOR) 25 MG tablet TAKE 1 TABLET TWO TIMES DAILY. MAKE OFFICE VISIT FOR MORE REFILLS. Patient taking differently: Take 25 mg by mouth at bedtime. 12/26/21   Croitoru, Mihai, MD  pantoprazole (PROTONIX) 40 MG tablet Take 1 tablet (40 mg total) by mouth daily. Patient taking differently: Take  40 mg by mouth at bedtime. 01/01/17   Azalee Course, PA  polyethylene glycol (MIRALAX / GLYCOLAX) 17 g packet Take 17 g by mouth 2 (two) times daily. 04/10/23   Jerald Kief, MD  QUEtiapine (SEROQUEL) 25 MG tablet Take 1 tablet (25 mg total) by mouth at bedtime. 04/10/23   Jerald Kief, MD  rosuvastatin (CRESTOR) 10 MG tablet Take 10 mg by mouth at bedtime. 12/01/20   [provider]  senna-docusate (SENOKOT-S) 8.6-50 MG tablet Take 1 tablet by mouth at bedtime. 04/10/23   Jerald Kief, MD      Allergies    Statins, Ativan [lorazepam], and Mirtazapine    Review of  Systems   Review of Systems  Physical Exam Updated Vital Signs BP 111/63   Pulse 71   Temp 98.3 F (36.8 C)   Resp (!) 32   Ht 5\' 11"  (1.803 m)   Wt 90 kg   SpO2 100%   BMI 27.67 kg/m  Physical Exam Vitals and nursing note reviewed.  Constitutional:      General: He is not in acute distress.    Appearance: Normal appearance.  HENT:     Head: Normocephalic and atraumatic.     Ears:     Comments: Hard of hearing    Nose: Nose normal.     Mouth/Throat:     Mouth: Mucous membranes are dry.     Pharynx: Oropharynx is clear.  Eyes:     Extraocular Movements: Extraocular movements intact.     Conjunctiva/sclera: Conjunctivae normal.     Pupils: Pupils are equal, round, and reactive to light.  Neck:     Comments: No midline neck tenderness Cardiovascular:     Rate and Rhythm: Normal rate and regular rhythm.     Heart sounds: Normal heart sounds.  Pulmonary:     Effort: Pulmonary effort is normal.     Breath sounds: Normal breath sounds.  Abdominal:     General: Abdomen is flat.     Palpations: Abdomen is soft.     Tenderness: There is no abdominal tenderness.  Musculoskeletal:        General: Normal range of motion.     Cervical back: Normal range of motion and neck supple.     Comments: No midline back tenderness Pelvis stable, nontender No bony tenderness in bilateral upper or lower extremities  Skin:    General: Skin is warm and dry.  Neurological:     Mental Status: He is alert.     Cranial Nerves: No cranial nerve deficit.     Sensory: No sensory deficit.     Motor: No weakness.  Psychiatric:        Mood and Affect: Mood normal.     Comments: Visual hallucinations - reaching for things in front of him that aren't in the room     ED Results / Procedures / Treatments   Labs (all labs ordered are listed, but only abnormal results are displayed) Labs Reviewed  COMPREHENSIVE METABOLIC PANEL - Abnormal; Notable for the following components:      Result Value    Calcium 8.6 (*)    Albumin 3.4 (*)    GFR, Estimated 60 (*)    All other components within normal limits  CBC WITH DIFFERENTIAL/PLATELET  URINALYSIS, ROUTINE W REFLEX MICROSCOPIC    EKG EKG Interpretation Date/Time:  Friday June 01 2023 13:55:45 EDT Ventricular Rate:  68 PR Interval:  172 QRS Duration:  128 QT Interval:  432 QTC Calculation: 460 R Axis:   -2  Text Interpretation: Sinus rhythm Probable left atrial enlargement Left ventricular hypertrophy No significant change since last tracing Confirmed by Elayne Snare ([id number]) on 06/01/2023 2:18:08 PM  Radiology DG Hip Unilat W or Wo Pelvis 2-3 Views Left  Result Date: 06/01/2023 CLINICAL DATA:  Bilateral hip pain for 1 week.  No known injury. EXAM: DG HIP (WITH OR WITHOUT PELVIS) 2-3V LEFT; DG HIP (WITH OR WITHOUT PELVIS) 2-3V RIGHT COMPARISON:  Intraoperative fluoroscopy for total right hip arthroplasty 04/05/2023 and total left hip arthroplasty 02/09/2022. Pelvis and left hip radiographs 02/08/2022 FINDINGS: There is diffuse decreased bone mineralization. Status post bilateral total hip arthroplasty. No perihardware lucency is seen to indicate hardware failure or loosening. Please note that frontal and lateral views of the left hip were performed but the two views of the right hip are both frontal views. No acute fracture or dislocation. Mild-to-moderate right L4-5 disc space narrowing with moderate peripheral endplate osteophytosis. The pubic symphysis joint space is maintained. Mild high-grade atherosclerotic calcifications. Surgical clips overlie the left groin. IMPRESSION: 1. Status post bilateral total hip arthroplasty without evidence of hardware failure or loosening. 2. Mild-to-moderate right L4-5 degenerative disc and endplate changes. Electronically Signed   By: Neita Garnet M.D.   On: 06/01/2023 12:54   DG Hip Unilat W or Wo Pelvis 2-3 Views Right  Result Date: 06/01/2023 CLINICAL DATA:  Bilateral hip pain for 1 week.   No known injury. EXAM: DG HIP (WITH OR WITHOUT PELVIS) 2-3V LEFT; DG HIP (WITH OR WITHOUT PELVIS) 2-3V RIGHT COMPARISON:  Intraoperative fluoroscopy for total right hip arthroplasty 04/05/2023 and total left hip arthroplasty 02/09/2022. Pelvis and left hip radiographs 02/08/2022 FINDINGS: There is diffuse decreased bone mineralization. Status post bilateral total hip arthroplasty. No perihardware lucency is seen to indicate hardware failure or loosening. Please note that frontal and lateral views of the left hip were performed but the two views of the right hip are both frontal views. No acute fracture or dislocation. Mild-to-moderate right L4-5 disc space narrowing with moderate peripheral endplate osteophytosis. The pubic symphysis joint space is maintained. Mild high-grade atherosclerotic calcifications. Surgical clips overlie the left groin. IMPRESSION: 1. Status post bilateral total hip arthroplasty without evidence of hardware failure or loosening. 2. Mild-to-moderate right L4-5 degenerative disc and endplate changes. Electronically Signed   By: Neita Garnet M.D.   On: 06/01/2023 12:54    Procedures Procedures    Medications Ordered in ED Medications - No data to display  ED Course/ Medical Decision Making/ A&P Clinical Course as of 06/01/23 1526  Fri Jun 01, 2023  1525 Labs within normal range. Patient signed out to Dr. Maple Hudson pending CT reads with plan for TOC/PT eval if images normal. [VK]    Clinical Course User Index [VK] Rexford Maus, DO                                 Medical Decision Making This patient presents to the ED with chief complaint(s) of weakness, frequent falls with pertinent past medical history of CAD, CVA, suspected dementia which further complicates the presenting complaint. The complaint involves an extensive differential diagnosis and also carries with it a high risk of complications and morbidity.    The differential diagnosis includes ICH, mass effect,  cervical spine fracture, infection, arrhythmia, anemia, electrolyte abnormality, dehydration, dementia  Additional history obtained: Additional history obtained from family Records reviewed  Primary Care Documents - MRI 8/27 without acute abnormality   ED Course and Reassessment: On patient's arrival he was hemodynamically stable in no acute distress.  He was initially evaluated by triage where he complained of hip pain and had bilateral hip x-rays performed that showed no acute traumatic injuries.  The patient denies any pain to me and rather his daughter endorses concern for weakness, wandering, forgetfulness and inability to care for him at home.  The patient will have further workup to evaluate for traumatic injury in the setting of his recent falls as well as cause of his overall weakness and confusion and will be closely reassessed.  Independent labs interpretation:  The following labs were independently interpreted: within normal range  Independent visualization of imaging: - I independently visualized the following imaging with scope of interpretation limited to determining acute life threatening conditions related to emergency care: CXR, bilateral hip Xrays , which revealed no acute traumatic injury    Amount and/or Complexity of Data Reviewed Labs: ordered. Radiology: ordered.          Final Clinical Impression(s) / ED Diagnoses Final diagnoses:  Frequent falls  Confusion    Rx / DC Orders ED Discharge Orders     None         Rexford Maus, DO 06/01/23 1526

## 2023-06-01 NOTE — ED Triage Notes (Signed)
Pt c/o bilat hip painx1wk. Pt denies injury

## 2023-06-01 NOTE — Progress Notes (Signed)
PT consult still pending. SNF bed search started.

## 2023-06-01 NOTE — TOC Initial Note (Signed)
Transition of Care [facility]) - Initial/Assessment Note    Patient Details  Name: Trevor Ward MRN: [id number] Date of Birth: 07-12-1941  [facility]) CM/SW Contact:    Susa Simmonds, LCSWA Phone Number: 06/01/2023, 6:48 PM  Clinical Narrative:  CSW spoke to patients wife. Ms. [name] stated that patient was doing okay after rehab walking with a walker. Patient recently discharged from [facility] in August. Patients wife stated he has had more falls and she cannot care for him in that condition. CSW asked patients wife if she has medicaid if she chooses long term care. Patients wife stated no. CSW advised patients wife to contact [facility] social services and apply. Patients wife stated they currently reside in her home in [place] but patient still has a home in [place]. Patiens wife stated she has no money to pay out of pocket for long term care. CSW also told patients wife that insurance will need to approve patient for another short term rehab stay. Patients wife is aware of patient possibly returning home if insurance doesn't approve another SNF stay.  Patients wife is aware medicaid approval can take 30-45 days. Patients wife would like patient to return to [facility] if approved.    Expected Discharge Plan: Skilled Nursing Facility Barriers to Discharge: Continued Medical Work up   Patient Goals and CMS Choice Patient states their goals for this hospitalization and ongoing recovery are:: Return home or long term care          Expected Discharge Plan and Services       Living arrangements for the past 2 months: Single Family Home, Skilled Nursing Facility                                      Prior Living Arrangements/Services Living arrangements for the past 2 months: Single Family Home, Skilled Nursing Facility Lives with:: Spouse Patient language and need for interpreter reviewed:: Yes Do you feel safe going back to the place where you  live?: Yes      Need for Family Participation in Patient Care: Yes (Comment) Care giver support system in place?: Yes (comment)   Criminal Activity/Legal Involvement Pertinent to Current Situation/Hospitalization: No - Comment as needed  Activities of Daily Living      Permission Sought/Granted                  Emotional Assessment       Orientation: : Oriented to Self Alcohol / Substance Use: Not Applicable Psych Involvement: No (comment)  Admission diagnosis:  Hip Pain Patient Active Problem List   Diagnosis Date Noted   Status post total replacement of right hip 04/19/2023   Closed displaced fracture of right femoral neck (HCC) 04/04/2023   Fall at home, initial encounter 04/04/2023   AAA (abdominal aortic aneurysm) (HCC) 04/04/2023   Elevated troponin 02/09/2022   Hip fracture (HCC) 02/09/2022   Malnutrition of moderate degree 02/09/2022   Closed left hip fracture, initial encounter (HCC) 02/08/2022   Hyperkalemia 02/08/2022   Thoracic aortic aneurysm without rupture (HCC) 06/19/2019   Coronary artery disease of native artery of native heart with stable angina pectoris (HCC) 04/14/2016   Near syncope 02/28/2016   HOH (hard of hearing) 02/08/2016   S/P CABG x 4 02/02/16 02/02/2016   Exertional dyspnea 01/28/2016   Aortic atherosclerosis (HCC) 01/28/2016   Essential hypertension 07/26/2015  History of Rt CA endarterectomy 07/26/2015   Dyslipidemia    Gout 04/11/2015   History of ischemic right MCA stroke 04/11/2015   PCP:  Lupita Raider, MD Pharmacy:   [id number] [address]), [place] - [address] [place] ([place]) [place] [address] Phone: [phone] Fax: [phone]  [facility] - [place], [place] - [id number], [address] [place] [address] Phone: [phone] Fax: [phone]  [facility] - [place], [place] - [address] [place] [address] Phone: [phone] Fax: [phone]  [facility] [address], [place] - [address] [place] [address] Phone: [phone] Fax: [phone]  [facility] [address] [place] [address] Phone: [phone] Fax: [phone]     Social Determinants of Health (SDOH) Social History: SDOH Screenings   Food Insecurity: No Food Insecurity (04/04/2023)  Housing: Low Risk  (04/04/2023)  Transportation Needs: No Transportation Needs (04/04/2023)  Utilities: Not At Risk (04/04/2023)  Tobacco Use: Medium Risk (06/01/2023)   SDOH Interventions:     Readmission Risk Interventions     No data to display

## 2023-06-01 NOTE — [place] FL2 (Signed)
[place] MEDICAID FL2 LEVEL OF CARE FORM     IDENTIFICATION  Patient Name: Trevor Ward Birthdate: [birth year] Sex: male Admission Date (Current Location): 06/01/2023  [facility] and [place] Number:  Producer, television/film/video and Address:  [facility] [place], [address] [place], [place] [address]      Provider Number: [id number]  Attending Physician Name and Address:  Coral Spikes, DO  Relative Name and Phone Number:  Zaylor, Jaquish (Spouse)  [phone]    Current Level of Care: Hospital Recommended Level of Care: Skilled Nursing Facility Prior Approval Number:    Date Approved/Denied:   PASRR Number: [id number]  Discharge Plan: SNF    Current Diagnoses: Patient Active Problem List   Diagnosis Date Noted   Status post total replacement of right hip 04/19/2023   Closed displaced fracture of right femoral neck (HCC) 04/04/2023   Fall at home, initial encounter 04/04/2023   AAA (abdominal aortic aneurysm) (HCC) 04/04/2023   Elevated troponin 02/09/2022   Hip fracture (HCC) 02/09/2022   Malnutrition of moderate degree 02/09/2022   Closed left hip fracture, initial encounter (HCC) 02/08/2022   Hyperkalemia 02/08/2022   Thoracic aortic aneurysm without rupture (HCC) 06/19/2019   Coronary artery disease of native artery of native heart with stable angina pectoris (HCC) 04/14/2016   Near syncope 02/28/2016   HOH (hard of hearing) 02/08/2016   S/P CABG x 4 02/02/16 02/02/2016   Exertional dyspnea 01/28/2016   Aortic atherosclerosis (HCC) 01/28/2016   Essential hypertension 07/26/2015   History of Rt CA endarterectomy 07/26/2015   Dyslipidemia    Gout 04/11/2015   History of ischemic right MCA stroke 04/11/2015    Orientation RESPIRATION BLADDER Height & Weight     Self  Normal Continent Weight: 198 lb 6.6 oz (90 kg) Height:  5\' 11"  (180.3 cm)  BEHAVIORAL SYMPTOMS/MOOD NEUROLOGICAL BOWEL NUTRITION STATUS      Continent Diet (Regular)   AMBULATORY STATUS COMMUNICATION OF NEEDS Skin   Extensive Assist Verbally Normal                       Personal Care Assistance Level of Assistance  Bathing, Feeding, Dressing Bathing Assistance: Maximum assistance Feeding assistance: Independent Dressing Assistance: Maximum assistance     Functional Limitations Info  Sight, Hearing, Speech Sight Info: Adequate Hearing Info: Adequate Speech Info: Adequate    SPECIAL CARE FACTORS FREQUENCY                       Contractures Contractures Info: Not present    Additional Factors Info  Code Status, Allergies Code Status Info: Full Allergies Info: Ativan (lorazepam), Statins, Mirtazapine           Current Medications (06/01/2023):  This is the current hospital active medication list Current Facility-Administered Medications  Medication Dose Route Frequency Provider Last Rate Last Admin   aspirin chewable tablet 81 mg  81 mg Oral BID Young, Travis J, DO       metoprolol tartrate (LOPRESSOR) tablet 25 mg  25 mg Oral QHS Young, Travis J, DO       pantoprazole (PROTONIX) EC tablet 40 mg  40 mg Oral Daily Estanislado Pandy J, DO   40 mg at 06/01/23 1842   polyethylene glycol (MIRALAX / GLYCOLAX) packet 17 g  17 g Oral BID Young, Travis J, DO       QUEtiapine (SEROQUEL) tablet 25 mg  25 mg Oral QHS Coral Spikes,  DO       rosuvastatin (CRESTOR) tablet 5 mg  5 mg Oral QHS Young, Travis J, DO       senna-docusate (Senokot-S) tablet 1 tablet  1 tablet Oral QHS Coral Spikes, DO       Current Outpatient Medications  Medication Sig Dispense Refill   acetaminophen (TYLENOL) 650 MG CR tablet Take 1,300 mg by mouth as needed for pain.     allopurinol (ZYLOPRIM) 100 MG tablet Take 100 mg by mouth daily.      amitriptyline (ELAVIL) 25 MG tablet Take 25 mg by mouth at bedtime.     aspirin 81 MG chewable tablet Chew 1 tablet (81 mg total) by mouth 2 (two) times daily. 30 tablet 0   buPROPion (WELLBUTRIN XL) 300 MG 24 hr tablet  Take 300 mg by mouth daily.     colchicine 0.6 MG tablet Take 0.6-1.2 mg by mouth daily as needed (gout).     diclofenac Sodium (VOLTAREN) 1 % GEL Apply 1 Application topically 3 (three) times daily as needed (pain).     docusate sodium (COLACE) 100 MG capsule Take 1 capsule (100 mg total) by mouth 2 (two) times daily. 30 capsule 0   Glucosamine Sulfate 1000 MG TABS Take 1,000 mg by mouth in the morning and at bedtime.     HYDROcodone-acetaminophen (NORCO/VICODIN) 5-325 MG tablet Take 1-2 tablets by mouth every 6 (six) hours as needed for moderate pain. 30 tablet 0   meclizine (ANTIVERT) 25 MG tablet Take 25 mg by mouth as needed for dizziness or nausea.  0   metoprolol tartrate (LOPRESSOR) 25 MG tablet TAKE 1 TABLET TWO TIMES DAILY. MAKE OFFICE VISIT FOR MORE REFILLS. (Patient taking differently: Take 25 mg by mouth at bedtime.) 180 tablet 3   pantoprazole (PROTONIX) 40 MG tablet Take 1 tablet (40 mg total) by mouth daily. (Patient taking differently: Take 40 mg by mouth at bedtime.) 90 tablet 3   polyethylene glycol (MIRALAX / GLYCOLAX) 17 g packet Take 17 g by mouth 2 (two) times daily. 14 each 0   QUEtiapine (SEROQUEL) 25 MG tablet Take 1 tablet (25 mg total) by mouth at bedtime. 30 tablet 0   rosuvastatin (CRESTOR) 10 MG tablet Take 10 mg by mouth at bedtime.     senna-docusate (SENOKOT-S) 8.6-50 MG tablet Take 1 tablet by mouth at bedtime. 30 tablet 0     Discharge Medications: Please see discharge summary for a list of discharge medications.  Relevant Imaging Results:  Relevant Lab Results:   Additional Information SSN: [id number]  Susa Simmonds, LCSWA

## 2023-06-01 NOTE — ED Notes (Signed)
Patient assisted up to a recliner, patient is very restless in bed and continues to attempts ambulate/get out of bed unassisted. Patient is unable to stand unassisted. Patient appears more comfortable sitting in the recliner

## 2023-06-01 NOTE — ED Provider Notes (Signed)
Received signout; pending labs and images and likely PT/TOC for placement as wife is no longer able to care for patient.   Evaluated; patient is resting comfortably in bed with no specific complaints.  Physical Exam  BP 111/63   Pulse 71   Temp 98.3 F (36.8 C)   Resp (!) 32   Ht 5\' 11"  (1.803 m)   Wt 90 kg   SpO2 100%   BMI 27.67 kg/m   Physical Exam Constitutional:      General: He is not in acute distress. HENT:     Head: Normocephalic and atraumatic.     Mouth/Throat:     Mouth: Mucous membranes are moist.  Eyes:     Conjunctiva/sclera: Conjunctivae normal.  Cardiovascular:     Rate and Rhythm: Normal rate.  Pulmonary:     Effort: Pulmonary effort is normal.  Abdominal:     General: There is no distension.  Skin:    General: Skin is warm.     Capillary Refill: Capillary refill takes less than 2 seconds.  Neurological:     Mental Status: He is alert. Mental status is at baseline.  Psychiatric:        Mood and Affect: Mood normal.        Behavior: Behavior normal.     Procedures  Procedures  ED Course / MDM   Clinical Course as of 06/01/23 1705  Fri Jun 01, 2023  1525 Labs within normal range. Patient signed out to Dr. Maple Hudson pending CT reads with plan for TOC/PT eval if images normal. [VK]    Clinical Course User Index [VK] Rexford Maus, DO   Medical Decision Making 82 year old presented for frequent falls, worsening cognitive decline and family being unable to care for him at home.  Vital signs reassuring.  See morning team's note for further HPI.  Labs without significant abnormality.  Imaging without acute pathology.  No medical necessity for admission.  Will consult PT OT/TOC placement.  Amount and/or Complexity of Data Reviewed Labs: ordered. Radiology: ordered.        Coral Spikes, DO 06/01/23 1705

## 2023-06-02 DIAGNOSIS — R410 Disorientation, unspecified: Secondary | ICD-10-CM | POA: Diagnosis not present

## 2023-06-02 DIAGNOSIS — R296 Repeated falls: Secondary | ICD-10-CM | POA: Diagnosis not present

## 2023-06-02 MED ORDER — HALOPERIDOL LACTATE 5 MG/ML IJ SOLN
5.0000 mg | Freq: Once | INTRAMUSCULAR | Status: AC
  Administered 2023-06-02: 5 mg via INTRAMUSCULAR
  Filled 2023-06-02: qty 1

## 2023-06-02 NOTE — ED Notes (Signed)
TOC

## 2023-06-02 NOTE — ED Notes (Signed)
Wife at bedside pt is resting comfortably in stretcher, no complaints.

## 2023-06-02 NOTE — ED Notes (Signed)
Shift report received, assumed care of patient at this time 

## 2023-06-02 NOTE — ED Notes (Signed)
Patient assisted back to bed with 2+ assist, bed alarm in use, safety sitter present

## 2023-06-02 NOTE — ED Notes (Signed)
Patient attempting to get out of bed, redirected patient to remain in bed.

## 2023-06-02 NOTE — ED Notes (Signed)
Safety fall precautions in place. No sitter available at this time. Pt remains on bed alarm in close view of nursing station will continue to monitor while in ED.

## 2023-06-02 NOTE — Evaluation (Signed)
Physical Therapy Evaluation Patient Details Name: Trevor Ward MRN: [id number] DOB: Jul 14, 1941 Today's Date: 06/02/2023  History of Present Illness  Pt is 82 yo male admitted on 06/01/23 with falls, weakness, and wife unable to manage at home.  Pt had recent R hip fx with anterior hemiarthroplasty on 04/05/23.  Pt went to SNF after his surgery and had returned to walking and home.  Other hx includes but not limited to dementia, CAD, CVA, arthritis, DM  Clinical Impression  Pt admitted with above diagnosis. At baseline pt was independent but since fracture on 04/05/23 he went to SNF and then returned home.  At home, was having increased weakness, falls, and difficulty with ambulation/transfers.  Wife no longer able to assist at home.  Today, pt required min-mod A for all transfers and ambulation of 62' with RW and mod A.  He had posterior lean with multiple loss of balance.  Pt is high fall risk and not at his baseline.  Do recommend that Patient will benefit from continued inpatient follow up therapy, <3 hours/day at d/c.   Pt currently with functional limitations due to the deficits listed below (see PT Problem List). Pt will benefit from acute skilled PT to increase their independence and safety with mobility to allow discharge.           If plan is discharge home, recommend the following: A lot of help with walking and/or transfers;A lot of help with bathing/dressing/bathroom   Can travel by private vehicle   Yes    Equipment Recommendations None recommended by PT  Recommendations for Other Services       Functional Status Assessment Patient has had a recent decline in their functional status and demonstrates the ability to make significant improvements in function in a reasonable and predictable amount of time.     Precautions / Restrictions Precautions Precautions: Fall Restrictions Weight Bearing Restrictions: Yes RLE Weight Bearing: Weight bearing as tolerated      Mobility   Bed Mobility Overal bed mobility: Needs Assistance Bed Mobility: Supine to Sit, Sit to Supine     Supine to sit: Mod assist Sit to supine: Min assist        Transfers Overall transfer level: Needs assistance Equipment used: Rolling walker (2 wheels) Transfers: Sit to/from Stand Sit to Stand: Min assist, Mod assist           General transfer comment: Stood x 6 during session - mostly mod A but 1 instance of min A; tendency to posterior lean; very unsafe sitting technique (sits too early, plops, requiring assist to get to chair/bed)    Ambulation/Gait Ambulation/Gait assistance: Mod assist, Min assist Gait Distance (Feet): 50 Feet Assistive device: Rolling walker (2 wheels) Gait Pattern/deviations: Step-through pattern, Shuffle Gait velocity: decreased     General Gait Details: Overall constant min A for balance with 2 episodes mod A (posterior LOB); cues for RW use  Stairs            Wheelchair Mobility     Tilt Bed    Modified Rankin (Stroke Patients Only)       Balance Overall balance assessment: Needs assistance Sitting-balance support: No upper extremity supported Sitting balance-Leahy Scale: Fair     Standing balance support: Bilateral upper extremity supported Standing balance-Leahy Scale: Poor Standing balance comment: min-mod A posterior lean  Pertinent Vitals/Pain Pain Assessment Pain Assessment: No/denies pain    Home Living Family/patient expects to be discharged to:: Skilled nursing facility Living Arrangements: Spouse/significant other;Children Available Help at Discharge: Available PRN/intermittently Type of Home: House Home Access: Stairs to enter   [employer] of Steps: 1+small step+1   Home Layout: One level Home Equipment: Shower seat - built in;Hand held Programmer, systems (2 wheels);Cane - single point;BSC/3in1 Additional Comments: wife works per last admission     Prior Function               Mobility Comments: Pt unable to provide hx.  Per chart review from last admission pt was independent with ambulation in home without RW.  He went to SNF after recent fx and had returned to walking but at home was falling and weak. ADLs Comments: minor assist for ADLs, wife completes iADLs     Extremity/Trunk Assessment   Upper Extremity Assessment Upper Extremity Assessment: Generalized weakness;Difficult to assess due to impaired cognition    Lower Extremity Assessment Lower Extremity Assessment: Generalized weakness;Difficult to assess due to impaired cognition (At least 3/5 but not following further commands)    Cervical / Trunk Assessment Cervical / Trunk Assessment: Normal  Communication   Communication Communication: Difficulty communicating thoughts/reduced clarity of speech  Cognition Arousal: Alert Behavior During Therapy: WFL for tasks assessed/performed Overall Cognitive Status: No family/caregiver present to determine baseline cognitive functioning                                 General Comments: oriented to self, follow simple commands        General Comments General comments (skin integrity, edema, etc.): VSS; pt incontinent at arrival - changed brief and sheets    Exercises     Assessment/Plan    PT Assessment Patient needs continued PT services  PT Problem List Decreased strength;Decreased range of motion;Decreased activity tolerance;Decreased balance;Decreased mobility;Decreased knowledge of precautions;Decreased safety awareness;Decreased knowledge of use of DME;Decreased cognition       PT Treatment Interventions DME instruction;Therapeutic exercise;Gait training;Balance training;Stair training;Functional mobility training;Patient/family education;Therapeutic activities;Modalities    PT Goals (Current goals can be found in the Care Plan section)  Acute Rehab PT Goals Patient Stated Goal: not  stated PT Goal Formulation: With patient Time For Goal Achievement: 06/16/23 Potential to Achieve Goals: Good    Frequency Min 1X/week     Co-evaluation               AM-PAC PT "6 Clicks" Mobility  Outcome Measure Help needed turning from your back to your side while in a flat bed without using bedrails?: A Little Help needed moving from lying on your back to sitting on the side of a flat bed without using bedrails?: A Lot Help needed moving to and from a bed to a chair (including a wheelchair)?: A Lot Help needed standing up from a chair using your arms (e.g., wheelchair or bedside chair)?: A Lot Help needed to walk in hospital room?: A Lot Help needed climbing 3-5 steps with a railing? : Total 6 Click Score: 12    End of Session Equipment Utilized During Treatment: Gait belt Activity Tolerance: Patient tolerated treatment well Patient left: in bed;with call bell/phone within reach;with bed alarm set Nurse Communication: Mobility status PT Visit Diagnosis: Other abnormalities of gait and mobility (R26.89);Muscle weakness (generalized) (M62.81);History of falling (Z91.81)    Time: 1610-9604 PT Time Calculation (min) (ACUTE  ONLY): 18 min   Charges:   PT Evaluation $PT Eval Low Complexity: 1 Low   PT General Charges $$ ACUTE PT VISIT: 1 Visit         Anise Salvo, PT Acute Rehab Services [facility] [phone]   Rayetta Humphrey 06/02/2023, 10:04 AM

## 2023-06-02 NOTE — Progress Notes (Addendum)
2pm: CSW spoke with French Ana at [facility] who states there are no beds available until the middle of next week.  CSW spoke with patient's wife Trevor Ward to present her with bed offer from [facility]. Trevor Ward is agreeable to accept bed offer as she is unable to care for patient at home in his current state.  CSW initiated insurance authorization through [employer] portal.  11am: CSW spoke with French Ana at [facility] to determine if patient can return to the facility. French Ana agreeable to review referral and return call to CSW with answer.   8:20am: Patient has no bed offers at this time.  Edwin Dada, MSW, LCSW Transitions of Care  Clinical Social Worker II [phone]

## 2023-06-02 NOTE — ED Provider Notes (Signed)
Emergency Medicine Observation Re-evaluation Note  Trevor Ward is a 82 y.o. male, seen on rounds today.  Pt initially presented to the ED for complaints of Hip Pain Currently, the patient is resting in bed, family bedside.  Physical Exam  BP 129/77 (BP Location: Right Arm)   Pulse 83   Temp 97.9 F (36.6 C) (Oral)   Resp 20   Ht 5\' 11"  (1.803 m)   Wt 90 kg   SpO2 99%   BMI 27.67 kg/m  Physical Exam General: NAD   ED Course / MDM  EKG:EKG Interpretation Date/Time:  Friday June 01 2023 13:55:45 EDT Ventricular Rate:  68 PR Interval:  172 QRS Duration:  128 QT Interval:  432 QTC Calculation: 460 R Axis:   -2  Text Interpretation: Sinus rhythm Probable left atrial enlargement Left ventricular hypertrophy No significant change since last tracing Confirmed by Elayne Snare ([id number]) on 06/01/2023 2:18:08 PM  I have reviewed the labs performed to date as well as medications administered while in observation.  Recent changes in the last 24 hours include SW for plcaement.  Plan  Current plan is for SNF placement, SW coordinating.    Ernie Avena, MD 06/02/23 1154

## 2023-06-03 DIAGNOSIS — R296 Repeated falls: Secondary | ICD-10-CM | POA: Diagnosis not present

## 2023-06-03 DIAGNOSIS — R410 Disorientation, unspecified: Secondary | ICD-10-CM | POA: Diagnosis not present

## 2023-06-03 MED ORDER — LORAZEPAM 2 MG/ML IJ SOLN
1.0000 mg | Freq: Once | INTRAMUSCULAR | Status: AC
  Administered 2023-06-03: 1 mg via INTRAMUSCULAR
  Filled 2023-06-03: qty 1

## 2023-06-03 NOTE — ED Notes (Signed)
Complete set of vitals updated in pt chart. Pt continues to be uncooperative with staff. Pt repositioned in bed with the help of this tech and nurse. Pt talking to himself while providing care. Pt brief dry at this time. Bed alarm remain in place with soft restraints.

## 2023-06-03 NOTE — ED Notes (Signed)
Pt very agitated and yelling. Kicking legs and trying to remove his gown and brief. Dr Karene Fry ordered IM ativan and it was given. NT at bedside, reassuring pt.

## 2023-06-03 NOTE — ED Notes (Signed)
Applied seizure pads to bed rails for patient safety, patient unable to be redirected.

## 2023-06-03 NOTE — ED Provider Notes (Signed)
Emergency Medicine Observation Re-evaluation Note  Trevor Ward is a 82 y.o. male, seen on rounds today.  Pt initially presented to the ED for complaints of Hip Pain Currently, the patient is resting in no distress.  Physical Exam  BP 114/63 (BP Location: Right Arm)   Pulse 91   Temp 98.3 F (36.8 C) (Oral)   Resp 16   Ht 5\' 11"  (1.803 m)   Wt 90 kg   SpO2 96%   BMI 27.67 kg/m  Physical Exam General: NAD   ED Course / MDM  EKG:EKG Interpretation Date/Time:  Friday June 01 2023 13:55:45 EDT Ventricular Rate:  68 PR Interval:  172 QRS Duration:  128 QT Interval:  432 QTC Calculation: 460 R Axis:   -2  Text Interpretation: Sinus rhythm Probable left atrial enlargement Left ventricular hypertrophy No significant change since last tracing Confirmed by Elayne Snare ([id number]) on 06/01/2023 2:18:08 PM  I have reviewed the labs performed to date as well as medications administered while in observation.  Recent changes in the last 24 hours include SW for plcaement.  Plan  Current plan is for SNF placement, SW coordinating.     Ernie Avena, MD 06/03/23 [phone]

## 2023-06-03 NOTE — ED Notes (Signed)
Pt resting in bed. Still continues to kick his feet and try to pull himself out of bed. Pt grabbing at objects in front of him that aren't there.

## 2023-06-03 NOTE — Progress Notes (Addendum)
3pm: Patient's insurance authorization is still pending at this time.  11:25am: Patient's insurance authorization is still pending at this time.  9am: Patient's insurance authorization is still pending at this time.  Edwin Dada, MSW, LCSW Transitions of Care  Clinical Social Worker II [phone]

## 2023-06-03 NOTE — ED Notes (Signed)
Unhooked pt's restraints to allow for full ROM and pt immediately started grabbing at items around his bed, this RN, and his clothing. Started getting agitated and tried grabbing this RN's arm. Soft restraints refastened. Pt's family members at bedside aware of soft restraints.

## 2023-06-03 NOTE — ED Notes (Signed)
Patient increasingly becoming more agitated, confused, pulling at equipment on walls, grabbing towards things in the air that are not there and attempting to get out of bed. Multiple attempts to redirect patient un successful, placed seizure pads on bed rails, fall mat on floor, bed alarm on and patient continues to be unable to follow directions. Requested for non violent restraints from provider for patient safety.

## 2023-06-03 NOTE — ED Notes (Signed)
Gave pt an activity lap "belt". Pt played with it for a few minutes, then ripped it off its velcro and threw it on the floor. Replaced the item and the pt repeated the sequence.

## 2023-06-04 ENCOUNTER — Encounter: Payer: Medicare HMO | Admitting: Orthopaedic Surgery

## 2023-06-04 DIAGNOSIS — R262 Difficulty in walking, not elsewhere classified: Secondary | ICD-10-CM | POA: Diagnosis not present

## 2023-06-04 DIAGNOSIS — E119 Type 2 diabetes mellitus without complications: Secondary | ICD-10-CM | POA: Diagnosis not present

## 2023-06-04 DIAGNOSIS — Z7401 Bed confinement status: Secondary | ICD-10-CM | POA: Diagnosis not present

## 2023-06-04 DIAGNOSIS — I69359 Hemiplegia and hemiparesis following cerebral infarction affecting unspecified side: Secondary | ICD-10-CM | POA: Diagnosis not present

## 2023-06-04 DIAGNOSIS — Z7982 Long term (current) use of aspirin: Secondary | ICD-10-CM | POA: Diagnosis not present

## 2023-06-04 DIAGNOSIS — F0390 Unspecified dementia without behavioral disturbance: Secondary | ICD-10-CM | POA: Diagnosis not present

## 2023-06-04 DIAGNOSIS — E785 Hyperlipidemia, unspecified: Secondary | ICD-10-CM | POA: Diagnosis not present

## 2023-06-04 DIAGNOSIS — I639 Cerebral infarction, unspecified: Secondary | ICD-10-CM | POA: Diagnosis not present

## 2023-06-04 DIAGNOSIS — M25559 Pain in unspecified hip: Secondary | ICD-10-CM | POA: Diagnosis not present

## 2023-06-04 DIAGNOSIS — I10 Essential (primary) hypertension: Secondary | ICD-10-CM | POA: Diagnosis not present

## 2023-06-04 DIAGNOSIS — R441 Visual hallucinations: Secondary | ICD-10-CM | POA: Diagnosis not present

## 2023-06-04 DIAGNOSIS — M6281 Muscle weakness (generalized): Secondary | ICD-10-CM | POA: Diagnosis not present

## 2023-06-04 DIAGNOSIS — R531 Weakness: Secondary | ICD-10-CM | POA: Diagnosis not present

## 2023-06-04 DIAGNOSIS — R413 Other amnesia: Secondary | ICD-10-CM | POA: Diagnosis not present

## 2023-06-04 DIAGNOSIS — Z8673 Personal history of transient ischemic attack (TIA), and cerebral infarction without residual deficits: Secondary | ICD-10-CM | POA: Diagnosis not present

## 2023-06-04 DIAGNOSIS — R296 Repeated falls: Secondary | ICD-10-CM | POA: Diagnosis not present

## 2023-06-04 DIAGNOSIS — M25551 Pain in right hip: Secondary | ICD-10-CM | POA: Diagnosis not present

## 2023-06-04 DIAGNOSIS — M25552 Pain in left hip: Secondary | ICD-10-CM | POA: Diagnosis not present

## 2023-06-04 DIAGNOSIS — I739 Peripheral vascular disease, unspecified: Secondary | ICD-10-CM | POA: Diagnosis not present

## 2023-06-04 DIAGNOSIS — I25810 Atherosclerosis of coronary artery bypass graft(s) without angina pectoris: Secondary | ICD-10-CM | POA: Diagnosis not present

## 2023-06-04 DIAGNOSIS — M150 Primary generalized (osteo)arthritis: Secondary | ICD-10-CM | POA: Diagnosis not present

## 2023-06-04 DIAGNOSIS — K219 Gastro-esophageal reflux disease without esophagitis: Secondary | ICD-10-CM | POA: Diagnosis not present

## 2023-06-04 DIAGNOSIS — I63311 Cerebral infarction due to thrombosis of right middle cerebral artery: Secondary | ICD-10-CM | POA: Diagnosis not present

## 2023-06-04 DIAGNOSIS — R410 Disorientation, unspecified: Secondary | ICD-10-CM | POA: Diagnosis not present

## 2023-06-04 DIAGNOSIS — R41841 Cognitive communication deficit: Secondary | ICD-10-CM | POA: Diagnosis not present

## 2023-06-04 DIAGNOSIS — I2510 Atherosclerotic heart disease of native coronary artery without angina pectoris: Secondary | ICD-10-CM | POA: Diagnosis not present

## 2023-06-04 DIAGNOSIS — K5900 Constipation, unspecified: Secondary | ICD-10-CM | POA: Diagnosis not present

## 2023-06-04 DIAGNOSIS — F32A Depression, unspecified: Secondary | ICD-10-CM | POA: Diagnosis not present

## 2023-06-04 DIAGNOSIS — F03A18 Unspecified dementia, mild, with other behavioral disturbance: Secondary | ICD-10-CM | POA: Diagnosis not present

## 2023-06-04 DIAGNOSIS — M109 Gout, unspecified: Secondary | ICD-10-CM | POA: Diagnosis not present

## 2023-06-04 NOTE — Discharge Planning (Signed)
Licensed Clinical Social Worker is seeking post-discharge placement for this patient at the following level of care: [place].

## 2023-06-04 NOTE — Progress Notes (Signed)
Insurance auth still pending [id number])

## 2023-06-04 NOTE — ED Provider Notes (Signed)
Emergency Medicine Observation Re-evaluation Note  Trevor Ward is a 82 y.o. male, seen on rounds today.  Pt initially presented to the ED for complaints of Hip Pain Currently, the patient is sleeping, in no distress.  Physical Exam  BP 126/62   Pulse 84   Temp 97.9 F (36.6 C) (Oral)   Resp 16   Ht 5\' 11"  (1.803 m)   Wt 90 kg   SpO2 98%   BMI 27.67 kg/m  Physical Exam General: No distress Lungs: No increased work of breathing Psych: Calm  ED Course / MDM  EKG:EKG Interpretation Date/Time:  Friday June 01 2023 13:55:45 EDT Ventricular Rate:  68 PR Interval:  172 QRS Duration:  128 QT Interval:  432 QTC Calculation: 460 R Axis:   -2  Text Interpretation: Sinus rhythm Probable left atrial enlargement Left ventricular hypertrophy No significant change since last tracing Confirmed by Elayne Snare ([id number]) on 06/01/2023 2:18:08 PM  I have reviewed the labs performed to date as well as medications administered while in observation.  Recent changes in the last 24 hours include ongoing efforts for facilitation of placement.  Plan  Current plan is for placement.    Gerhard Munch, MD 06/04/23 [phone]

## 2023-06-04 NOTE — ED Notes (Signed)
ETA  1 hour for pt pick uo

## 2023-06-04 NOTE — ED Notes (Signed)
Attempted to call report. Left a VM

## 2023-06-04 NOTE — ED Provider Notes (Signed)
Patient accepted to [facility].   Gerhard Munch, MD 06/04/23 1252

## 2023-06-04 NOTE — ED Provider Notes (Signed)
12:12 AM Renewal of order for non-violent restraints placed. Patient is persistently agitated, not redirectable; constantly trying to get out of bed and pulling at things. Hx dementia. A&Ox0 at baseline.   Antony Madura, PA-C 06/04/23 0013    Zadie Rhine, MD 06/04/23 [phone]

## 2023-06-04 NOTE — ED Notes (Signed)
Attempted to call report. Left a VM.  [phone].  [address]  [place], [place]    [phone]

## 2023-06-04 NOTE — ED Notes (Signed)
Report called to Renita, nurse with [facility].

## 2023-06-04 NOTE — Progress Notes (Signed)
Patients insurance authorization for [facility] was approved ([id number] Plan Auth ID). CSW notified Olegario Messier and [facility] in admissions. Patient is going to room 3221, number for report is [phone].

## 2023-06-04 NOTE — Evaluation (Signed)
Occupational Therapy Evaluation Patient Details Name: Trevor Ward MRN: [id number] DOB: 1941-04-30 Today's Date: 06/04/2023   History of Present Illness Pt is 82 yo male admitted on 06/01/23 with falls, weakness, and wife unable to manage at home.  Pt had recent R hip fx with anterior hemiarthroplasty on 04/05/23.  Pt went to SNF after his surgery and had returned to walking and home.  Other hx includes but not limited to dementia, CAD, CVA, arthritis, DM   Clinical Impression   Patient calm and lying in stretcher.  Patient lethargic, suspect medications, but following simple commands and moving upper and lower extremities well.  Patient needing up to Mod A for simple transfers and Mod A for lower body ADL.  Patient has been falling a lot more at home, and Patient will benefit from continued inpatient follow up therapy, <3 hours/day to maximize functional status prior to returning home.  OT will continue efforts in the acute setting to address deficits.          If plan is discharge home, recommend the following: A lot of help with bathing/dressing/bathroom;A lot of help with walking and/or transfers;Direct supervision/assist for medications management;Assist for transportation;Assistance with cooking/housework;Help with stairs or ramp for entrance    Functional Status Assessment  Patient has had a recent decline in their functional status and demonstrates the ability to make significant improvements in function in a reasonable and predictable amount of time.  Equipment Recommendations  None recommended by OT    Recommendations for Other Services       Precautions / Restrictions Precautions Precautions: Fall Restrictions Weight Bearing Restrictions: Yes RLE Weight Bearing: Weight bearing as tolerated      Mobility Bed Mobility Overal bed mobility: Needs Assistance Bed Mobility: Supine to Sit, Sit to Supine     Supine to sit: Mod assist Sit to supine: Mod assist         Transfers Overall transfer level: Needs assistance Equipment used: Rolling walker (2 wheels) Transfers: Sit to/from Stand Sit to Stand: Mod assist                  Balance Overall balance assessment: Needs assistance Sitting-balance support: No upper extremity supported Sitting balance-Leahy Scale: Fair     Standing balance support: Bilateral upper extremity supported Standing balance-Leahy Scale: Poor                             ADL either performed or assessed with clinical judgement   ADL Overall ADL's : Needs assistance/impaired Eating/Feeding: Minimal assistance;Bed level   Grooming: Wash/dry hands;Wash/dry face;Contact guard assist;Sitting   Upper Body Bathing: Minimal assistance;Sitting   Lower Body Bathing: Moderate assistance;Sitting/lateral leans   Upper Body Dressing : Minimal assistance;Sitting   Lower Body Dressing: Moderate assistance;Sit to/from stand   Toilet Transfer: Moderate assistance;Stand-pivot;BSC/3in1                   Vision Patient Visual Report: No change from baseline       Perception Perception: Not tested       Praxis Praxis: Not tested       Pertinent Vitals/Pain Pain Assessment Pain Assessment: Faces Faces Pain Scale: No hurt Pain Intervention(s): Monitored during session     Extremity/Trunk Assessment Upper Extremity Assessment Upper Extremity Assessment: Overall WFL for tasks assessed   Lower Extremity Assessment Lower Extremity Assessment: Defer to PT evaluation   Cervical / Trunk Assessment Cervical / Trunk Assessment: Kyphotic  Communication Communication Communication: Difficulty communicating thoughts/reduced clarity of speech   Cognition Arousal: Lethargic Behavior During Therapy: Flat affect Overall Cognitive Status: History of cognitive impairments - at baseline                                 General Comments: oriented to self, follow simple commands      General Comments       Exercises     Shoulder Instructions      Home Living Family/patient expects to be discharged to:: Skilled nursing facility Living Arrangements: Spouse/significant other;Children Available Help at Discharge: Available PRN/intermittently Type of Home: House Home Access: Stairs to enter [employer] of Steps: 1+small step+1   Home Layout: One level     Bathroom Shower/Tub: Producer, television/film/video: Standard     Home Equipment: Shower seat - built in;Hand held Programmer, systems (2 wheels);Cane - single point;BSC/3in1          Prior Functioning/Environment               Mobility Comments: Pt unable to provide hx.  Per chart review from last admission pt was independent with ambulation in home without RW.  He went to SNF after recent fx and had returned to walking but at home was falling and weak. ADLs Comments: minor assist for ADLs, wife completes iADLs        OT Problem List: Decreased strength;Decreased activity tolerance;Impaired balance (sitting and/or standing);Decreased safety awareness;Decreased cognition      OT Treatment/Interventions: Self-care/ADL training;Therapeutic activities;Cognitive remediation/compensation;Balance training;DME and/or AE instruction    OT Goals(Current goals can be found in the care plan section) Acute Rehab OT Goals OT Goal Formulation: Patient unable to participate in goal setting Time For Goal Achievement: 06/18/23 Potential to Achieve Goals: Fair ADL Goals Pt Will Perform Grooming: with supervision;standing Pt Will Perform Upper Body Dressing: with supervision;sitting Pt Will Perform Lower Body Dressing: with supervision;sit to/from stand Pt Will Transfer to Toilet: with supervision;ambulating;regular height toilet  OT Frequency: Min 1X/week    Co-evaluation              AM-PAC OT "6 Clicks" Daily Activity     Outcome Measure Help from another person eating meals?:  A Little Help from another person taking care of personal grooming?: A Little Help from another person toileting, which includes using toliet, bedpan, or urinal?: A Lot Help from another person bathing (including washing, rinsing, drying)?: A Lot Help from another person to put on and taking off regular upper body clothing?: A Little Help from another person to put on and taking off regular lower body clothing?: A Lot 6 Click Score: 15   End of Session Nurse Communication: Mobility status  Activity Tolerance: Patient tolerated treatment well Patient left: in bed;with call bell/phone within reach  OT Visit Diagnosis: Unsteadiness on feet (R26.81);History of falling (Z91.81);Other symptoms and signs involving cognitive function                Time: 7628-3151 OT Time Calculation (min): 21 min Charges:  OT General Charges $OT Visit: 1 Visit  06/04/2023  RP, OTR/L  Acute Rehabilitation Services  Office:  [phone]   Suzanna Obey 06/04/2023, 1:37 PM

## 2023-06-05 DIAGNOSIS — K5900 Constipation, unspecified: Secondary | ICD-10-CM | POA: Diagnosis not present

## 2023-06-05 DIAGNOSIS — M150 Primary generalized (osteo)arthritis: Secondary | ICD-10-CM | POA: Diagnosis not present

## 2023-06-05 DIAGNOSIS — I639 Cerebral infarction, unspecified: Secondary | ICD-10-CM | POA: Diagnosis not present

## 2023-06-05 DIAGNOSIS — E119 Type 2 diabetes mellitus without complications: Secondary | ICD-10-CM | POA: Diagnosis not present

## 2023-06-05 DIAGNOSIS — R296 Repeated falls: Secondary | ICD-10-CM | POA: Diagnosis not present

## 2023-06-05 DIAGNOSIS — F03A18 Unspecified dementia, mild, with other behavioral disturbance: Secondary | ICD-10-CM | POA: Diagnosis not present

## 2023-06-05 DIAGNOSIS — I10 Essential (primary) hypertension: Secondary | ICD-10-CM | POA: Diagnosis not present

## 2023-06-05 DIAGNOSIS — K219 Gastro-esophageal reflux disease without esophagitis: Secondary | ICD-10-CM | POA: Diagnosis not present

## 2023-06-05 DIAGNOSIS — M109 Gout, unspecified: Secondary | ICD-10-CM | POA: Diagnosis not present

## 2023-06-05 DIAGNOSIS — F32A Depression, unspecified: Secondary | ICD-10-CM | POA: Diagnosis not present

## 2023-06-08 DIAGNOSIS — R413 Other amnesia: Secondary | ICD-10-CM | POA: Diagnosis not present

## 2023-06-08 DIAGNOSIS — F0390 Unspecified dementia without behavioral disturbance: Secondary | ICD-10-CM | POA: Diagnosis not present

## 2023-06-08 DIAGNOSIS — Z8673 Personal history of transient ischemic attack (TIA), and cerebral infarction without residual deficits: Secondary | ICD-10-CM | POA: Diagnosis not present

## 2023-06-08 DIAGNOSIS — I25810 Atherosclerosis of coronary artery bypass graft(s) without angina pectoris: Secondary | ICD-10-CM | POA: Diagnosis not present

## 2023-06-08 DIAGNOSIS — I739 Peripheral vascular disease, unspecified: Secondary | ICD-10-CM | POA: Diagnosis not present

## 2023-06-08 DIAGNOSIS — M109 Gout, unspecified: Secondary | ICD-10-CM | POA: Diagnosis not present

## 2023-06-08 DIAGNOSIS — R531 Weakness: Secondary | ICD-10-CM | POA: Diagnosis not present

## 2023-06-08 DIAGNOSIS — I69359 Hemiplegia and hemiparesis following cerebral infarction affecting unspecified side: Secondary | ICD-10-CM | POA: Diagnosis not present

## 2023-06-08 DIAGNOSIS — R410 Disorientation, unspecified: Secondary | ICD-10-CM | POA: Diagnosis not present

## 2023-06-12 DIAGNOSIS — F03A18 Unspecified dementia, mild, with other behavioral disturbance: Secondary | ICD-10-CM | POA: Diagnosis not present

## 2023-06-12 DIAGNOSIS — M150 Primary generalized (osteo)arthritis: Secondary | ICD-10-CM | POA: Diagnosis not present

## 2023-06-12 DIAGNOSIS — K219 Gastro-esophageal reflux disease without esophagitis: Secondary | ICD-10-CM | POA: Diagnosis not present

## 2023-06-12 DIAGNOSIS — I10 Essential (primary) hypertension: Secondary | ICD-10-CM | POA: Diagnosis not present

## 2023-06-18 DIAGNOSIS — I10 Essential (primary) hypertension: Secondary | ICD-10-CM | POA: Diagnosis not present

## 2023-06-18 DIAGNOSIS — E785 Hyperlipidemia, unspecified: Secondary | ICD-10-CM | POA: Diagnosis not present

## 2023-06-18 DIAGNOSIS — G9340 Encephalopathy, unspecified: Secondary | ICD-10-CM | POA: Diagnosis not present

## 2023-06-18 DIAGNOSIS — I69344 Monoplegia of lower limb following cerebral infarction affecting left non-dominant side: Secondary | ICD-10-CM | POA: Diagnosis not present

## 2023-06-18 DIAGNOSIS — F329 Major depressive disorder, single episode, unspecified: Secondary | ICD-10-CM | POA: Diagnosis not present

## 2023-06-18 DIAGNOSIS — I69341 Monoplegia of lower limb following cerebral infarction affecting right dominant side: Secondary | ICD-10-CM | POA: Diagnosis not present

## 2023-06-18 DIAGNOSIS — K5900 Constipation, unspecified: Secondary | ICD-10-CM | POA: Diagnosis not present

## 2023-06-18 DIAGNOSIS — E46 Unspecified protein-calorie malnutrition: Secondary | ICD-10-CM | POA: Diagnosis not present

## 2023-06-18 DIAGNOSIS — F0393 Unspecified dementia, unspecified severity, with mood disturbance: Secondary | ICD-10-CM | POA: Diagnosis not present

## 2023-06-25 DIAGNOSIS — I69344 Monoplegia of lower limb following cerebral infarction affecting left non-dominant side: Secondary | ICD-10-CM | POA: Diagnosis not present

## 2023-06-25 DIAGNOSIS — K5900 Constipation, unspecified: Secondary | ICD-10-CM | POA: Diagnosis not present

## 2023-06-25 DIAGNOSIS — F329 Major depressive disorder, single episode, unspecified: Secondary | ICD-10-CM | POA: Diagnosis not present

## 2023-06-25 DIAGNOSIS — G9340 Encephalopathy, unspecified: Secondary | ICD-10-CM | POA: Diagnosis not present

## 2023-06-25 DIAGNOSIS — I10 Essential (primary) hypertension: Secondary | ICD-10-CM | POA: Diagnosis not present

## 2023-06-25 DIAGNOSIS — E785 Hyperlipidemia, unspecified: Secondary | ICD-10-CM | POA: Diagnosis not present

## 2023-06-25 DIAGNOSIS — E46 Unspecified protein-calorie malnutrition: Secondary | ICD-10-CM | POA: Diagnosis not present

## 2023-06-25 DIAGNOSIS — I69341 Monoplegia of lower limb following cerebral infarction affecting right dominant side: Secondary | ICD-10-CM | POA: Diagnosis not present

## 2023-06-25 DIAGNOSIS — F0393 Unspecified dementia, unspecified severity, with mood disturbance: Secondary | ICD-10-CM | POA: Diagnosis not present

## 2023-06-26 DIAGNOSIS — E46 Unspecified protein-calorie malnutrition: Secondary | ICD-10-CM | POA: Diagnosis not present

## 2023-06-26 DIAGNOSIS — F329 Major depressive disorder, single episode, unspecified: Secondary | ICD-10-CM | POA: Diagnosis not present

## 2023-06-26 DIAGNOSIS — G9340 Encephalopathy, unspecified: Secondary | ICD-10-CM | POA: Diagnosis not present

## 2023-06-26 DIAGNOSIS — I10 Essential (primary) hypertension: Secondary | ICD-10-CM | POA: Diagnosis not present

## 2023-06-26 DIAGNOSIS — I69341 Monoplegia of lower limb following cerebral infarction affecting right dominant side: Secondary | ICD-10-CM | POA: Diagnosis not present

## 2023-06-26 DIAGNOSIS — K5900 Constipation, unspecified: Secondary | ICD-10-CM | POA: Diagnosis not present

## 2023-06-26 DIAGNOSIS — F0393 Unspecified dementia, unspecified severity, with mood disturbance: Secondary | ICD-10-CM | POA: Diagnosis not present

## 2023-06-26 DIAGNOSIS — I69344 Monoplegia of lower limb following cerebral infarction affecting left non-dominant side: Secondary | ICD-10-CM | POA: Diagnosis not present

## 2023-06-26 DIAGNOSIS — E785 Hyperlipidemia, unspecified: Secondary | ICD-10-CM | POA: Diagnosis not present

## 2023-06-27 DIAGNOSIS — F329 Major depressive disorder, single episode, unspecified: Secondary | ICD-10-CM | POA: Diagnosis not present

## 2023-06-27 DIAGNOSIS — I69341 Monoplegia of lower limb following cerebral infarction affecting right dominant side: Secondary | ICD-10-CM | POA: Diagnosis not present

## 2023-06-27 DIAGNOSIS — E46 Unspecified protein-calorie malnutrition: Secondary | ICD-10-CM | POA: Diagnosis not present

## 2023-06-27 DIAGNOSIS — I69344 Monoplegia of lower limb following cerebral infarction affecting left non-dominant side: Secondary | ICD-10-CM | POA: Diagnosis not present

## 2023-06-27 DIAGNOSIS — K5900 Constipation, unspecified: Secondary | ICD-10-CM | POA: Diagnosis not present

## 2023-06-27 DIAGNOSIS — F0393 Unspecified dementia, unspecified severity, with mood disturbance: Secondary | ICD-10-CM | POA: Diagnosis not present

## 2023-06-27 DIAGNOSIS — E785 Hyperlipidemia, unspecified: Secondary | ICD-10-CM | POA: Diagnosis not present

## 2023-06-27 DIAGNOSIS — G9340 Encephalopathy, unspecified: Secondary | ICD-10-CM | POA: Diagnosis not present

## 2023-06-27 DIAGNOSIS — I10 Essential (primary) hypertension: Secondary | ICD-10-CM | POA: Diagnosis not present

## 2023-06-28 DIAGNOSIS — R54 Age-related physical debility: Secondary | ICD-10-CM | POA: Diagnosis not present

## 2023-06-28 DIAGNOSIS — R499 Unspecified voice and resonance disorder: Secondary | ICD-10-CM | POA: Diagnosis not present

## 2023-06-28 DIAGNOSIS — Z9989 Dependence on other enabling machines and devices: Secondary | ICD-10-CM | POA: Diagnosis not present

## 2023-06-28 DIAGNOSIS — Z23 Encounter for immunization: Secondary | ICD-10-CM | POA: Diagnosis not present

## 2023-06-28 DIAGNOSIS — F0390 Unspecified dementia without behavioral disturbance: Secondary | ICD-10-CM | POA: Diagnosis not present

## 2023-06-28 DIAGNOSIS — R296 Repeated falls: Secondary | ICD-10-CM | POA: Diagnosis not present

## 2023-06-28 DIAGNOSIS — M8000XA Age-related osteoporosis with current pathological fracture, unspecified site, initial encounter for fracture: Secondary | ICD-10-CM | POA: Diagnosis not present

## 2023-07-02 DIAGNOSIS — I69341 Monoplegia of lower limb following cerebral infarction affecting right dominant side: Secondary | ICD-10-CM | POA: Diagnosis not present

## 2023-07-02 DIAGNOSIS — F329 Major depressive disorder, single episode, unspecified: Secondary | ICD-10-CM | POA: Diagnosis not present

## 2023-07-02 DIAGNOSIS — G9340 Encephalopathy, unspecified: Secondary | ICD-10-CM | POA: Diagnosis not present

## 2023-07-02 DIAGNOSIS — F0393 Unspecified dementia, unspecified severity, with mood disturbance: Secondary | ICD-10-CM | POA: Diagnosis not present

## 2023-07-02 DIAGNOSIS — I69344 Monoplegia of lower limb following cerebral infarction affecting left non-dominant side: Secondary | ICD-10-CM | POA: Diagnosis not present

## 2023-07-02 DIAGNOSIS — E785 Hyperlipidemia, unspecified: Secondary | ICD-10-CM | POA: Diagnosis not present

## 2023-07-02 DIAGNOSIS — E46 Unspecified protein-calorie malnutrition: Secondary | ICD-10-CM | POA: Diagnosis not present

## 2023-07-02 DIAGNOSIS — K5900 Constipation, unspecified: Secondary | ICD-10-CM | POA: Diagnosis not present

## 2023-07-02 DIAGNOSIS — I10 Essential (primary) hypertension: Secondary | ICD-10-CM | POA: Diagnosis not present

## 2023-07-06 DIAGNOSIS — I69341 Monoplegia of lower limb following cerebral infarction affecting right dominant side: Secondary | ICD-10-CM | POA: Diagnosis not present

## 2023-07-06 DIAGNOSIS — F0393 Unspecified dementia, unspecified severity, with mood disturbance: Secondary | ICD-10-CM | POA: Diagnosis not present

## 2023-07-06 DIAGNOSIS — E785 Hyperlipidemia, unspecified: Secondary | ICD-10-CM | POA: Diagnosis not present

## 2023-07-06 DIAGNOSIS — K5900 Constipation, unspecified: Secondary | ICD-10-CM | POA: Diagnosis not present

## 2023-07-06 DIAGNOSIS — E46 Unspecified protein-calorie malnutrition: Secondary | ICD-10-CM | POA: Diagnosis not present

## 2023-07-06 DIAGNOSIS — I69344 Monoplegia of lower limb following cerebral infarction affecting left non-dominant side: Secondary | ICD-10-CM | POA: Diagnosis not present

## 2023-07-06 DIAGNOSIS — G9340 Encephalopathy, unspecified: Secondary | ICD-10-CM | POA: Diagnosis not present

## 2023-07-06 DIAGNOSIS — I10 Essential (primary) hypertension: Secondary | ICD-10-CM | POA: Diagnosis not present

## 2023-07-06 DIAGNOSIS — F329 Major depressive disorder, single episode, unspecified: Secondary | ICD-10-CM | POA: Diagnosis not present

## 2023-07-11 DIAGNOSIS — G9340 Encephalopathy, unspecified: Secondary | ICD-10-CM | POA: Diagnosis not present

## 2023-07-11 DIAGNOSIS — F329 Major depressive disorder, single episode, unspecified: Secondary | ICD-10-CM | POA: Diagnosis not present

## 2023-07-11 DIAGNOSIS — K5900 Constipation, unspecified: Secondary | ICD-10-CM | POA: Diagnosis not present

## 2023-07-11 DIAGNOSIS — E46 Unspecified protein-calorie malnutrition: Secondary | ICD-10-CM | POA: Diagnosis not present

## 2023-07-11 DIAGNOSIS — I69344 Monoplegia of lower limb following cerebral infarction affecting left non-dominant side: Secondary | ICD-10-CM | POA: Diagnosis not present

## 2023-07-11 DIAGNOSIS — I69341 Monoplegia of lower limb following cerebral infarction affecting right dominant side: Secondary | ICD-10-CM | POA: Diagnosis not present

## 2023-07-11 DIAGNOSIS — E785 Hyperlipidemia, unspecified: Secondary | ICD-10-CM | POA: Diagnosis not present

## 2023-07-11 DIAGNOSIS — I10 Essential (primary) hypertension: Secondary | ICD-10-CM | POA: Diagnosis not present

## 2023-07-11 DIAGNOSIS — F0393 Unspecified dementia, unspecified severity, with mood disturbance: Secondary | ICD-10-CM | POA: Diagnosis not present

## 2023-07-18 DIAGNOSIS — F329 Major depressive disorder, single episode, unspecified: Secondary | ICD-10-CM | POA: Diagnosis not present

## 2023-07-18 DIAGNOSIS — F0393 Unspecified dementia, unspecified severity, with mood disturbance: Secondary | ICD-10-CM | POA: Diagnosis not present

## 2023-07-18 DIAGNOSIS — I10 Essential (primary) hypertension: Secondary | ICD-10-CM | POA: Diagnosis not present

## 2023-07-18 DIAGNOSIS — I69344 Monoplegia of lower limb following cerebral infarction affecting left non-dominant side: Secondary | ICD-10-CM | POA: Diagnosis not present

## 2023-07-18 DIAGNOSIS — E46 Unspecified protein-calorie malnutrition: Secondary | ICD-10-CM | POA: Diagnosis not present

## 2023-07-18 DIAGNOSIS — K5900 Constipation, unspecified: Secondary | ICD-10-CM | POA: Diagnosis not present

## 2023-07-18 DIAGNOSIS — G9340 Encephalopathy, unspecified: Secondary | ICD-10-CM | POA: Diagnosis not present

## 2023-07-18 DIAGNOSIS — E785 Hyperlipidemia, unspecified: Secondary | ICD-10-CM | POA: Diagnosis not present

## 2023-07-18 DIAGNOSIS — I69341 Monoplegia of lower limb following cerebral infarction affecting right dominant side: Secondary | ICD-10-CM | POA: Diagnosis not present

## 2023-07-23 DIAGNOSIS — F329 Major depressive disorder, single episode, unspecified: Secondary | ICD-10-CM | POA: Diagnosis not present

## 2023-07-23 DIAGNOSIS — E46 Unspecified protein-calorie malnutrition: Secondary | ICD-10-CM | POA: Diagnosis not present

## 2023-07-23 DIAGNOSIS — I69341 Monoplegia of lower limb following cerebral infarction affecting right dominant side: Secondary | ICD-10-CM | POA: Diagnosis not present

## 2023-07-23 DIAGNOSIS — I69344 Monoplegia of lower limb following cerebral infarction affecting left non-dominant side: Secondary | ICD-10-CM | POA: Diagnosis not present

## 2023-07-23 DIAGNOSIS — E785 Hyperlipidemia, unspecified: Secondary | ICD-10-CM | POA: Diagnosis not present

## 2023-07-23 DIAGNOSIS — K5900 Constipation, unspecified: Secondary | ICD-10-CM | POA: Diagnosis not present

## 2023-07-23 DIAGNOSIS — G9340 Encephalopathy, unspecified: Secondary | ICD-10-CM | POA: Diagnosis not present

## 2023-07-23 DIAGNOSIS — F0393 Unspecified dementia, unspecified severity, with mood disturbance: Secondary | ICD-10-CM | POA: Diagnosis not present

## 2023-07-23 DIAGNOSIS — I10 Essential (primary) hypertension: Secondary | ICD-10-CM | POA: Diagnosis not present

## 2023-07-25 DIAGNOSIS — I69344 Monoplegia of lower limb following cerebral infarction affecting left non-dominant side: Secondary | ICD-10-CM | POA: Diagnosis not present

## 2023-07-25 DIAGNOSIS — I69341 Monoplegia of lower limb following cerebral infarction affecting right dominant side: Secondary | ICD-10-CM | POA: Diagnosis not present

## 2023-07-25 DIAGNOSIS — E46 Unspecified protein-calorie malnutrition: Secondary | ICD-10-CM | POA: Diagnosis not present

## 2023-07-25 DIAGNOSIS — K5900 Constipation, unspecified: Secondary | ICD-10-CM | POA: Diagnosis not present

## 2023-07-25 DIAGNOSIS — G9340 Encephalopathy, unspecified: Secondary | ICD-10-CM | POA: Diagnosis not present

## 2023-07-25 DIAGNOSIS — F0393 Unspecified dementia, unspecified severity, with mood disturbance: Secondary | ICD-10-CM | POA: Diagnosis not present

## 2023-07-25 DIAGNOSIS — F329 Major depressive disorder, single episode, unspecified: Secondary | ICD-10-CM | POA: Diagnosis not present

## 2023-07-25 DIAGNOSIS — E785 Hyperlipidemia, unspecified: Secondary | ICD-10-CM | POA: Diagnosis not present

## 2023-07-25 DIAGNOSIS — I10 Essential (primary) hypertension: Secondary | ICD-10-CM | POA: Diagnosis not present

## 2023-07-30 DIAGNOSIS — F0393 Unspecified dementia, unspecified severity, with mood disturbance: Secondary | ICD-10-CM | POA: Diagnosis not present

## 2023-07-30 DIAGNOSIS — G9340 Encephalopathy, unspecified: Secondary | ICD-10-CM | POA: Diagnosis not present

## 2023-07-30 DIAGNOSIS — F329 Major depressive disorder, single episode, unspecified: Secondary | ICD-10-CM | POA: Diagnosis not present

## 2023-07-30 DIAGNOSIS — E785 Hyperlipidemia, unspecified: Secondary | ICD-10-CM | POA: Diagnosis not present

## 2023-07-30 DIAGNOSIS — I69341 Monoplegia of lower limb following cerebral infarction affecting right dominant side: Secondary | ICD-10-CM | POA: Diagnosis not present

## 2023-07-30 DIAGNOSIS — I10 Essential (primary) hypertension: Secondary | ICD-10-CM | POA: Diagnosis not present

## 2023-07-30 DIAGNOSIS — K5900 Constipation, unspecified: Secondary | ICD-10-CM | POA: Diagnosis not present

## 2023-07-30 DIAGNOSIS — E46 Unspecified protein-calorie malnutrition: Secondary | ICD-10-CM | POA: Diagnosis not present

## 2023-07-30 DIAGNOSIS — I69344 Monoplegia of lower limb following cerebral infarction affecting left non-dominant side: Secondary | ICD-10-CM | POA: Diagnosis not present

## 2023-08-10 DIAGNOSIS — F0393 Unspecified dementia, unspecified severity, with mood disturbance: Secondary | ICD-10-CM | POA: Diagnosis not present

## 2023-08-10 DIAGNOSIS — I69344 Monoplegia of lower limb following cerebral infarction affecting left non-dominant side: Secondary | ICD-10-CM | POA: Diagnosis not present

## 2023-08-10 DIAGNOSIS — K5900 Constipation, unspecified: Secondary | ICD-10-CM | POA: Diagnosis not present

## 2023-08-10 DIAGNOSIS — I10 Essential (primary) hypertension: Secondary | ICD-10-CM | POA: Diagnosis not present

## 2023-08-10 DIAGNOSIS — G9340 Encephalopathy, unspecified: Secondary | ICD-10-CM | POA: Diagnosis not present

## 2023-08-10 DIAGNOSIS — F329 Major depressive disorder, single episode, unspecified: Secondary | ICD-10-CM | POA: Diagnosis not present

## 2023-08-10 DIAGNOSIS — E785 Hyperlipidemia, unspecified: Secondary | ICD-10-CM | POA: Diagnosis not present

## 2023-08-10 DIAGNOSIS — E46 Unspecified protein-calorie malnutrition: Secondary | ICD-10-CM | POA: Diagnosis not present

## 2023-08-10 DIAGNOSIS — I69341 Monoplegia of lower limb following cerebral infarction affecting right dominant side: Secondary | ICD-10-CM | POA: Diagnosis not present

## 2023-08-13 DIAGNOSIS — I10 Essential (primary) hypertension: Secondary | ICD-10-CM | POA: Diagnosis not present

## 2023-08-13 DIAGNOSIS — F0393 Unspecified dementia, unspecified severity, with mood disturbance: Secondary | ICD-10-CM | POA: Diagnosis not present

## 2023-08-13 DIAGNOSIS — G9340 Encephalopathy, unspecified: Secondary | ICD-10-CM | POA: Diagnosis not present

## 2023-08-13 DIAGNOSIS — I69341 Monoplegia of lower limb following cerebral infarction affecting right dominant side: Secondary | ICD-10-CM | POA: Diagnosis not present

## 2023-08-13 DIAGNOSIS — E46 Unspecified protein-calorie malnutrition: Secondary | ICD-10-CM | POA: Diagnosis not present

## 2023-08-13 DIAGNOSIS — E785 Hyperlipidemia, unspecified: Secondary | ICD-10-CM | POA: Diagnosis not present

## 2023-08-13 DIAGNOSIS — I69344 Monoplegia of lower limb following cerebral infarction affecting left non-dominant side: Secondary | ICD-10-CM | POA: Diagnosis not present

## 2023-08-13 DIAGNOSIS — K5900 Constipation, unspecified: Secondary | ICD-10-CM | POA: Diagnosis not present

## 2023-08-13 DIAGNOSIS — F329 Major depressive disorder, single episode, unspecified: Secondary | ICD-10-CM | POA: Diagnosis not present

## 2023-08-27 DIAGNOSIS — Z9989 Dependence on other enabling machines and devices: Secondary | ICD-10-CM | POA: Diagnosis not present

## 2023-08-27 DIAGNOSIS — F0390 Unspecified dementia without behavioral disturbance: Secondary | ICD-10-CM | POA: Diagnosis not present

## 2023-08-27 DIAGNOSIS — R54 Age-related physical debility: Secondary | ICD-10-CM | POA: Diagnosis not present

## 2023-08-27 DIAGNOSIS — M109 Gout, unspecified: Secondary | ICD-10-CM | POA: Diagnosis not present

## 2023-08-27 DIAGNOSIS — R296 Repeated falls: Secondary | ICD-10-CM | POA: Diagnosis not present

## 2023-10-30 DIAGNOSIS — I712 Thoracic aortic aneurysm, without rupture, unspecified: Secondary | ICD-10-CM | POA: Diagnosis not present

## 2023-10-30 DIAGNOSIS — E1169 Type 2 diabetes mellitus with other specified complication: Secondary | ICD-10-CM | POA: Diagnosis not present

## 2023-10-30 DIAGNOSIS — N529 Male erectile dysfunction, unspecified: Secondary | ICD-10-CM | POA: Diagnosis not present

## 2023-10-30 DIAGNOSIS — J449 Chronic obstructive pulmonary disease, unspecified: Secondary | ICD-10-CM | POA: Diagnosis not present

## 2023-10-30 DIAGNOSIS — I119 Hypertensive heart disease without heart failure: Secondary | ICD-10-CM | POA: Diagnosis not present

## 2023-10-30 DIAGNOSIS — E782 Mixed hyperlipidemia: Secondary | ICD-10-CM | POA: Diagnosis not present

## 2023-10-30 DIAGNOSIS — F411 Generalized anxiety disorder: Secondary | ICD-10-CM | POA: Diagnosis not present

## 2023-10-30 DIAGNOSIS — F0390 Unspecified dementia without behavioral disturbance: Secondary | ICD-10-CM | POA: Diagnosis not present

## 2023-10-30 DIAGNOSIS — K449 Diaphragmatic hernia without obstruction or gangrene: Secondary | ICD-10-CM | POA: Diagnosis not present

## 2023-10-30 DIAGNOSIS — Z0000 Encounter for general adult medical examination without abnormal findings: Secondary | ICD-10-CM | POA: Diagnosis not present

## 2023-10-30 DIAGNOSIS — M8000XA Age-related osteoporosis with current pathological fracture, unspecified site, initial encounter for fracture: Secondary | ICD-10-CM | POA: Diagnosis not present

## 2023-10-30 DIAGNOSIS — I779 Disorder of arteries and arterioles, unspecified: Secondary | ICD-10-CM | POA: Diagnosis not present

## 2023-10-31 DIAGNOSIS — I119 Hypertensive heart disease without heart failure: Secondary | ICD-10-CM | POA: Diagnosis not present

## 2024-02-23 DIAGNOSIS — I10 Essential (primary) hypertension: Secondary | ICD-10-CM | POA: Diagnosis not present

## 2024-02-23 DIAGNOSIS — J449 Chronic obstructive pulmonary disease, unspecified: Secondary | ICD-10-CM | POA: Diagnosis not present

## 2024-02-23 DIAGNOSIS — I2510 Atherosclerotic heart disease of native coronary artery without angina pectoris: Secondary | ICD-10-CM | POA: Diagnosis not present

## 2024-02-23 DIAGNOSIS — E782 Mixed hyperlipidemia: Secondary | ICD-10-CM | POA: Diagnosis not present

## 2024-02-25 DIAGNOSIS — E663 Overweight: Secondary | ICD-10-CM | POA: Diagnosis not present

## 2024-02-25 DIAGNOSIS — Z85828 Personal history of other malignant neoplasm of skin: Secondary | ICD-10-CM | POA: Diagnosis not present

## 2024-02-25 DIAGNOSIS — Z6827 Body mass index (BMI) 27.0-27.9, adult: Secondary | ICD-10-CM | POA: Diagnosis not present

## 2024-02-25 DIAGNOSIS — F17211 Nicotine dependence, cigarettes, in remission: Secondary | ICD-10-CM | POA: Diagnosis not present

## 2024-02-25 DIAGNOSIS — R2681 Unsteadiness on feet: Secondary | ICD-10-CM | POA: Diagnosis not present

## 2024-02-25 DIAGNOSIS — I2510 Atherosclerotic heart disease of native coronary artery without angina pectoris: Secondary | ICD-10-CM | POA: Diagnosis not present

## 2024-02-25 DIAGNOSIS — Z008 Encounter for other general examination: Secondary | ICD-10-CM | POA: Diagnosis not present

## 2024-02-26 DIAGNOSIS — F0390 Unspecified dementia without behavioral disturbance: Secondary | ICD-10-CM | POA: Diagnosis not present

## 2024-02-26 DIAGNOSIS — G4700 Insomnia, unspecified: Secondary | ICD-10-CM | POA: Diagnosis not present

## 2024-02-26 DIAGNOSIS — R4182 Altered mental status, unspecified: Secondary | ICD-10-CM | POA: Diagnosis not present

## 2024-02-28 DIAGNOSIS — R4182 Altered mental status, unspecified: Secondary | ICD-10-CM | POA: Diagnosis not present

## 2024-02-28 DIAGNOSIS — R296 Repeated falls: Secondary | ICD-10-CM | POA: Diagnosis not present

## 2024-03-11 DIAGNOSIS — H35033 Hypertensive retinopathy, bilateral: Secondary | ICD-10-CM | POA: Diagnosis not present

## 2024-03-11 DIAGNOSIS — H5213 Myopia, bilateral: Secondary | ICD-10-CM | POA: Diagnosis not present

## 2024-03-11 DIAGNOSIS — H43813 Vitreous degeneration, bilateral: Secondary | ICD-10-CM | POA: Diagnosis not present

## 2024-03-11 DIAGNOSIS — H524 Presbyopia: Secondary | ICD-10-CM | POA: Diagnosis not present

## 2024-03-11 DIAGNOSIS — H52223 Regular astigmatism, bilateral: Secondary | ICD-10-CM | POA: Diagnosis not present

## 2024-03-11 DIAGNOSIS — H2513 Age-related nuclear cataract, bilateral: Secondary | ICD-10-CM | POA: Diagnosis not present

## 2024-03-24 DIAGNOSIS — I10 Essential (primary) hypertension: Secondary | ICD-10-CM | POA: Diagnosis not present

## 2024-03-24 DIAGNOSIS — J449 Chronic obstructive pulmonary disease, unspecified: Secondary | ICD-10-CM | POA: Diagnosis not present

## 2024-03-24 DIAGNOSIS — I2510 Atherosclerotic heart disease of native coronary artery without angina pectoris: Secondary | ICD-10-CM | POA: Diagnosis not present

## 2024-03-24 DIAGNOSIS — E782 Mixed hyperlipidemia: Secondary | ICD-10-CM | POA: Diagnosis not present

## 2024-04-15 ENCOUNTER — Telehealth: Payer: Self-pay | Admitting: *Deleted

## 2024-04-16 DIAGNOSIS — H2513 Age-related nuclear cataract, bilateral: Secondary | ICD-10-CM | POA: Diagnosis not present

## 2024-04-16 DIAGNOSIS — H31011 Macula scars of posterior pole (postinflammatory) (post-traumatic), right eye: Secondary | ICD-10-CM | POA: Diagnosis not present

## 2024-04-16 DIAGNOSIS — H35033 Hypertensive retinopathy, bilateral: Secondary | ICD-10-CM | POA: Diagnosis not present

## 2024-04-16 DIAGNOSIS — H43813 Vitreous degeneration, bilateral: Secondary | ICD-10-CM | POA: Diagnosis not present

## 2024-04-24 DIAGNOSIS — E782 Mixed hyperlipidemia: Secondary | ICD-10-CM | POA: Diagnosis not present

## 2024-04-24 DIAGNOSIS — I10 Essential (primary) hypertension: Secondary | ICD-10-CM | POA: Diagnosis not present

## 2024-04-24 DIAGNOSIS — J449 Chronic obstructive pulmonary disease, unspecified: Secondary | ICD-10-CM | POA: Diagnosis not present

## 2024-04-24 DIAGNOSIS — I2510 Atherosclerotic heart disease of native coronary artery without angina pectoris: Secondary | ICD-10-CM | POA: Diagnosis not present

## 2024-04-28 DIAGNOSIS — F0390 Unspecified dementia without behavioral disturbance: Secondary | ICD-10-CM | POA: Diagnosis not present

## 2024-04-28 DIAGNOSIS — K219 Gastro-esophageal reflux disease without esophagitis: Secondary | ICD-10-CM | POA: Diagnosis not present

## 2024-04-28 DIAGNOSIS — E782 Mixed hyperlipidemia: Secondary | ICD-10-CM | POA: Diagnosis not present

## 2024-04-28 DIAGNOSIS — I119 Hypertensive heart disease without heart failure: Secondary | ICD-10-CM | POA: Diagnosis not present

## 2024-04-28 DIAGNOSIS — E1169 Type 2 diabetes mellitus with other specified complication: Secondary | ICD-10-CM | POA: Diagnosis not present

## 2024-04-28 DIAGNOSIS — M810 Age-related osteoporosis without current pathological fracture: Secondary | ICD-10-CM | POA: Diagnosis not present

## 2024-05-13 DIAGNOSIS — H2511 Age-related nuclear cataract, right eye: Secondary | ICD-10-CM | POA: Diagnosis not present

## 2024-05-23 DIAGNOSIS — M810 Age-related osteoporosis without current pathological fracture: Secondary | ICD-10-CM | POA: Diagnosis not present

## 2024-05-25 DIAGNOSIS — I10 Essential (primary) hypertension: Secondary | ICD-10-CM | POA: Diagnosis not present

## 2024-05-25 DIAGNOSIS — I2510 Atherosclerotic heart disease of native coronary artery without angina pectoris: Secondary | ICD-10-CM | POA: Diagnosis not present

## 2024-05-25 DIAGNOSIS — E782 Mixed hyperlipidemia: Secondary | ICD-10-CM | POA: Diagnosis not present

## 2024-05-25 DIAGNOSIS — J449 Chronic obstructive pulmonary disease, unspecified: Secondary | ICD-10-CM | POA: Diagnosis not present

## 2024-05-27 DIAGNOSIS — H2511 Age-related nuclear cataract, right eye: Secondary | ICD-10-CM | POA: Diagnosis not present

## 2024-06-02 DIAGNOSIS — H2512 Age-related nuclear cataract, left eye: Secondary | ICD-10-CM | POA: Diagnosis not present

## 2024-06-17 DIAGNOSIS — H2512 Age-related nuclear cataract, left eye: Secondary | ICD-10-CM | POA: Diagnosis not present

## 2024-06-24 DIAGNOSIS — J449 Chronic obstructive pulmonary disease, unspecified: Secondary | ICD-10-CM | POA: Diagnosis not present

## 2024-06-24 DIAGNOSIS — E782 Mixed hyperlipidemia: Secondary | ICD-10-CM | POA: Diagnosis not present

## 2024-06-24 DIAGNOSIS — I10 Essential (primary) hypertension: Secondary | ICD-10-CM | POA: Diagnosis not present

## 2024-06-24 DIAGNOSIS — I2510 Atherosclerotic heart disease of native coronary artery without angina pectoris: Secondary | ICD-10-CM | POA: Diagnosis not present

## 2024-07-25 NOTE — Telephone Encounter (Signed)
 Encounter opened in error.
